# Patient Record
Sex: Male | Born: 1940 | Race: White | Hispanic: No | Marital: Married | Smoking: Current some day smoker
Health system: Southern US, Community
[De-identification: ages and names within clinical notes are randomized; demographics above are authoritative.]

## PROBLEM LIST (undated history)

## (undated) DIAGNOSIS — G4730 Sleep apnea, unspecified: Secondary | ICD-10-CM

## (undated) DIAGNOSIS — C159 Malignant neoplasm of esophagus, unspecified: Secondary | ICD-10-CM

## (undated) DIAGNOSIS — I4891 Unspecified atrial fibrillation: Secondary | ICD-10-CM

## (undated) DIAGNOSIS — I4819 Other persistent atrial fibrillation: Secondary | ICD-10-CM

## (undated) DIAGNOSIS — I499 Cardiac arrhythmia, unspecified: Secondary | ICD-10-CM

## (undated) DIAGNOSIS — K219 Gastro-esophageal reflux disease without esophagitis: Secondary | ICD-10-CM

## (undated) DIAGNOSIS — I10 Essential (primary) hypertension: Secondary | ICD-10-CM

## (undated) DIAGNOSIS — Z9289 Personal history of other medical treatment: Secondary | ICD-10-CM

## (undated) DIAGNOSIS — M1990 Unspecified osteoarthritis, unspecified site: Secondary | ICD-10-CM

## (undated) DIAGNOSIS — J449 Chronic obstructive pulmonary disease, unspecified: Secondary | ICD-10-CM

## (undated) HISTORY — PX: HERNIA REPAIR: SHX51

---

## 2000-04-13 DIAGNOSIS — C439 Malignant melanoma of skin, unspecified: Secondary | ICD-10-CM | POA: Insufficient documentation

## 2012-08-15 DIAGNOSIS — J309 Allergic rhinitis, unspecified: Secondary | ICD-10-CM | POA: Insufficient documentation

## 2012-08-15 DIAGNOSIS — J449 Chronic obstructive pulmonary disease, unspecified: Secondary | ICD-10-CM | POA: Insufficient documentation

## 2012-08-15 DIAGNOSIS — R0602 Shortness of breath: Secondary | ICD-10-CM | POA: Insufficient documentation

## 2012-08-15 DIAGNOSIS — I10 Essential (primary) hypertension: Secondary | ICD-10-CM | POA: Insufficient documentation

## 2012-08-15 DIAGNOSIS — B0229 Other postherpetic nervous system involvement: Secondary | ICD-10-CM | POA: Insufficient documentation

## 2012-08-15 DIAGNOSIS — R5381 Other malaise: Secondary | ICD-10-CM | POA: Insufficient documentation

## 2012-08-15 DIAGNOSIS — G933 Postviral fatigue syndrome: Secondary | ICD-10-CM | POA: Insufficient documentation

## 2012-08-15 DIAGNOSIS — M48061 Spinal stenosis, lumbar region without neurogenic claudication: Secondary | ICD-10-CM | POA: Insufficient documentation

## 2012-08-15 DIAGNOSIS — R0681 Apnea, not elsewhere classified: Secondary | ICD-10-CM | POA: Insufficient documentation

## 2012-08-15 DIAGNOSIS — M169 Osteoarthritis of hip, unspecified: Secondary | ICD-10-CM | POA: Insufficient documentation

## 2012-08-15 DIAGNOSIS — M549 Dorsalgia, unspecified: Secondary | ICD-10-CM | POA: Insufficient documentation

## 2012-08-15 DIAGNOSIS — R6889 Other general symptoms and signs: Secondary | ICD-10-CM | POA: Insufficient documentation

## 2012-08-15 DIAGNOSIS — I482 Chronic atrial fibrillation, unspecified: Secondary | ICD-10-CM | POA: Insufficient documentation

## 2012-08-15 DIAGNOSIS — M1710 Unilateral primary osteoarthritis, unspecified knee: Secondary | ICD-10-CM | POA: Insufficient documentation

## 2012-10-15 DIAGNOSIS — N400 Enlarged prostate without lower urinary tract symptoms: Secondary | ICD-10-CM | POA: Insufficient documentation

## 2012-11-23 DIAGNOSIS — K219 Gastro-esophageal reflux disease without esophagitis: Secondary | ICD-10-CM | POA: Insufficient documentation

## 2012-11-23 DIAGNOSIS — M5430 Sciatica, unspecified side: Secondary | ICD-10-CM | POA: Insufficient documentation

## 2013-03-11 DIAGNOSIS — G5600 Carpal tunnel syndrome, unspecified upper limb: Secondary | ICD-10-CM | POA: Insufficient documentation

## 2013-10-09 DIAGNOSIS — G4730 Sleep apnea, unspecified: Secondary | ICD-10-CM | POA: Insufficient documentation

## 2014-02-05 DIAGNOSIS — R1310 Dysphagia, unspecified: Secondary | ICD-10-CM | POA: Insufficient documentation

## 2014-03-18 ENCOUNTER — Ambulatory Visit: Payer: Self-pay | Admitting: Oncology

## 2014-03-27 LAB — COMPREHENSIVE METABOLIC PANEL
ALBUMIN: 3.8 g/dL (ref 3.4–5.0)
Alkaline Phosphatase: 89 U/L
Anion Gap: 7 (ref 7–16)
BUN: 16 mg/dL (ref 7–18)
Bilirubin,Total: 0.6 mg/dL (ref 0.2–1.0)
CHLORIDE: 102 mmol/L (ref 98–107)
Calcium, Total: 8.7 mg/dL (ref 8.5–10.1)
Co2: 32 mmol/L (ref 21–32)
Creatinine: 1.11 mg/dL (ref 0.60–1.30)
EGFR (African American): >60
GLUCOSE: 107 mg/dL — AB (ref 65–99)
Osmolality: 283 (ref 275–301)
Potassium: 4.1 mmol/L (ref 3.5–5.1)
SGOT(AST): 22 U/L (ref 15–37)
SGPT (ALT): 27 U/L
SODIUM: 141 mmol/L (ref 136–145)
Total Protein: 6.9 g/dL (ref 6.4–8.2)

## 2014-03-27 LAB — CBC CANCER CENTER
BASOS ABS: 0 x10 3/mm (ref 0.0–0.1)
BASOS PCT: 0.7 %
Eosinophil #: 0.2 x10 3/mm (ref 0.0–0.7)
Eosinophil %: 3.2 %
HCT: 44.3 % (ref 40.0–52.0)
HGB: 14.9 g/dL (ref 13.0–18.0)
Lymphocyte #: 2 x10 3/mm (ref 1.0–3.6)
Lymphocyte %: 28.5 %
MCH: 29.3 pg (ref 26.0–34.0)
MCHC: 33.6 g/dL (ref 32.0–36.0)
MCV: 87 fL (ref 80–100)
Monocyte #: 0.6 x10 3/mm (ref 0.2–1.0)
Monocyte %: 8.9 %
Neutrophil #: 4.1 x10 3/mm (ref 1.4–6.5)
Neutrophil %: 58.7 %
PLATELETS: 196 x10 3/mm (ref 150–440)
RBC: 5.07 10*6/uL (ref 4.40–5.90)
RDW: 14.5 % (ref 11.5–14.5)
WBC: 6.9 x10 3/mm (ref 3.8–10.6)

## 2014-04-03 LAB — CBC CANCER CENTER
BASOS PCT: 0.7 %
Basophil #: 0 x10 3/mm (ref 0.0–0.1)
Eosinophil #: 0.2 x10 3/mm (ref 0.0–0.7)
Eosinophil %: 3.4 %
HCT: 43.7 % (ref 40.0–52.0)
HGB: 14.6 g/dL (ref 13.0–18.0)
LYMPHS ABS: 2.3 x10 3/mm (ref 1.0–3.6)
Lymphocyte %: 34.8 %
MCH: 29.5 pg (ref 26.0–34.0)
MCHC: 33.4 g/dL (ref 32.0–36.0)
MCV: 88 fL (ref 80–100)
MONOS PCT: 5.4 %
Monocyte #: 0.4 x10 3/mm (ref 0.2–1.0)
NEUTROS ABS: 3.6 x10 3/mm (ref 1.4–6.5)
Neutrophil %: 55.7 %
PLATELETS: 196 x10 3/mm (ref 150–440)
RBC: 4.96 10*6/uL (ref 4.40–5.90)
RDW: 14.6 % — ABNORMAL HIGH (ref 11.5–14.5)
WBC: 6.5 x10 3/mm (ref 3.8–10.6)

## 2014-04-03 LAB — COMPREHENSIVE METABOLIC PANEL
ALBUMIN: 3.6 g/dL (ref 3.4–5.0)
AST: 22 U/L (ref 15–37)
Alkaline Phosphatase: 95 U/L
Anion Gap: 6 — ABNORMAL LOW (ref 7–16)
BILIRUBIN TOTAL: 0.7 mg/dL (ref 0.2–1.0)
BUN: 20 mg/dL — ABNORMAL HIGH (ref 7–18)
CALCIUM: 8.8 mg/dL (ref 8.5–10.1)
CHLORIDE: 101 mmol/L (ref 98–107)
Co2: 31 mmol/L (ref 21–32)
Creatinine: 1.12 mg/dL (ref 0.60–1.30)
GLUCOSE: 106 mg/dL — AB (ref 65–99)
OSMOLALITY: 279 (ref 275–301)
Potassium: 5 mmol/L (ref 3.5–5.1)
SGPT (ALT): 29 U/L
Sodium: 138 mmol/L (ref 136–145)
TOTAL PROTEIN: 6.8 g/dL (ref 6.4–8.2)

## 2014-04-10 LAB — CBC CANCER CENTER
Basophil #: 0 x10 3/mm (ref 0.0–0.1)
Basophil %: 0.8 %
Eosinophil #: 0.1 x10 3/mm (ref 0.0–0.7)
Eosinophil %: 2.3 %
HCT: 43.9 % (ref 40.0–52.0)
HGB: 14.6 g/dL (ref 13.0–18.0)
LYMPHS PCT: 42.1 %
Lymphocyte #: 1.5 x10 3/mm (ref 1.0–3.6)
MCH: 29.5 pg (ref 26.0–34.0)
MCHC: 33.2 g/dL (ref 32.0–36.0)
MCV: 89 fL (ref 80–100)
Monocyte #: 0.4 x10 3/mm (ref 0.2–1.0)
Monocyte %: 10 %
NEUTROS PCT: 44.8 %
Neutrophil #: 1.6 x10 3/mm (ref 1.4–6.5)
PLATELETS: 188 x10 3/mm (ref 150–440)
RBC: 4.94 10*6/uL (ref 4.40–5.90)
RDW: 14.1 % (ref 11.5–14.5)
WBC: 3.6 x10 3/mm — ABNORMAL LOW (ref 3.8–10.6)

## 2014-04-10 LAB — COMPREHENSIVE METABOLIC PANEL
ALK PHOS: 86 U/L
ALT: 30 U/L
Albumin: 3.6 g/dL (ref 3.4–5.0)
Anion Gap: 7 (ref 7–16)
BILIRUBIN TOTAL: 0.5 mg/dL (ref 0.2–1.0)
BUN: 23 mg/dL — AB (ref 7–18)
CO2: 31 mmol/L (ref 21–32)
CREATININE: 1.15 mg/dL (ref 0.60–1.30)
Calcium, Total: 9 mg/dL (ref 8.5–10.1)
Chloride: 103 mmol/L (ref 98–107)
EGFR (African American): >60
GLUCOSE: 91 mg/dL (ref 65–99)
Osmolality: 285 (ref 275–301)
Potassium: 4.9 mmol/L (ref 3.5–5.1)
SGOT(AST): 19 U/L (ref 15–37)
Sodium: 141 mmol/L (ref 136–145)
Total Protein: 6.9 g/dL (ref 6.4–8.2)

## 2014-04-17 LAB — COMPREHENSIVE METABOLIC PANEL
ANION GAP: 9 (ref 7–16)
AST: 23 U/L (ref 15–37)
Albumin: 3.6 g/dL (ref 3.4–5.0)
Alkaline Phosphatase: 91 U/L
BILIRUBIN TOTAL: 0.3 mg/dL (ref 0.2–1.0)
BUN: 13 mg/dL (ref 7–18)
CHLORIDE: 104 mmol/L (ref 98–107)
Calcium, Total: 8.6 mg/dL (ref 8.5–10.1)
Co2: 30 mmol/L (ref 21–32)
Creatinine: 1.05 mg/dL (ref 0.60–1.30)
EGFR (Non-African Amer.): >60
Glucose: 100 mg/dL — ABNORMAL HIGH (ref 65–99)
Osmolality: 285 (ref 275–301)
Potassium: 4.7 mmol/L (ref 3.5–5.1)
SGPT (ALT): 32 U/L
Sodium: 143 mmol/L (ref 136–145)
Total Protein: 6.7 g/dL (ref 6.4–8.2)

## 2014-04-17 LAB — CBC CANCER CENTER
Basophil #: 0.1 x10 3/mm (ref 0.0–0.1)
Basophil %: 2.1 %
Eosinophil #: 0 x10 3/mm (ref 0.0–0.7)
Eosinophil %: 0.9 %
HCT: 42.4 % (ref 40.0–52.0)
HGB: 14.1 g/dL (ref 13.0–18.0)
Lymphocyte #: 0.7 x10 3/mm — ABNORMAL LOW (ref 1.0–3.6)
Lymphocyte %: 27.5 %
MCH: 29.4 pg (ref 26.0–34.0)
MCHC: 33.2 g/dL (ref 32.0–36.0)
MCV: 88 fL (ref 80–100)
MONOS PCT: 12.7 %
Monocyte #: 0.3 x10 3/mm (ref 0.2–1.0)
NEUTROS ABS: 1.5 x10 3/mm (ref 1.4–6.5)
NEUTROS PCT: 56.8 %
Platelet: 170 x10 3/mm (ref 150–440)
RBC: 4.79 10*6/uL (ref 4.40–5.90)
RDW: 14.1 % (ref 11.5–14.5)
WBC: 2.7 x10 3/mm — ABNORMAL LOW (ref 3.8–10.6)

## 2014-04-18 ENCOUNTER — Ambulatory Visit: Payer: Self-pay | Admitting: Oncology

## 2014-04-28 LAB — CBC CANCER CENTER
BASOS ABS: 0 x10 3/mm (ref 0.0–0.1)
Basophil %: 1 %
EOS ABS: 0 x10 3/mm (ref 0.0–0.7)
Eosinophil %: 1.3 %
HCT: 38.8 % — ABNORMAL LOW (ref 40.0–52.0)
HGB: 13.3 g/dL (ref 13.0–18.0)
LYMPHS ABS: 0.8 x10 3/mm — AB (ref 1.0–3.6)
Lymphocyte %: 25 %
MCH: 29.9 pg (ref 26.0–34.0)
MCHC: 34.2 g/dL (ref 32.0–36.0)
MCV: 88 fL (ref 80–100)
MONOS PCT: 18.5 %
Monocyte #: 0.6 x10 3/mm (ref 0.2–1.0)
Neutrophil #: 1.8 x10 3/mm (ref 1.4–6.5)
Neutrophil %: 54.2 %
Platelet: 89 x10 3/mm — ABNORMAL LOW (ref 150–440)
RBC: 4.43 10*6/uL (ref 4.40–5.90)
RDW: 14.4 % (ref 11.5–14.5)
WBC: 3.4 x10 3/mm — ABNORMAL LOW (ref 3.8–10.6)

## 2014-05-01 LAB — CBC CANCER CENTER
BASOS ABS: 0 x10 3/mm (ref 0.0–0.1)
Basophil %: 0.9 %
EOS ABS: 0 x10 3/mm (ref 0.0–0.7)
Eosinophil %: 1.4 %
HCT: 39.8 % — AB (ref 40.0–52.0)
HGB: 13.3 g/dL (ref 13.0–18.0)
LYMPHS ABS: 0.8 x10 3/mm — AB (ref 1.0–3.6)
Lymphocyte %: 24.4 %
MCH: 29.9 pg (ref 26.0–34.0)
MCHC: 33.6 g/dL (ref 32.0–36.0)
MCV: 89 fL (ref 80–100)
Monocyte #: 0.8 x10 3/mm (ref 0.2–1.0)
Monocyte %: 24 %
Neutrophil #: 1.6 x10 3/mm (ref 1.4–6.5)
Neutrophil %: 49.3 %
PLATELETS: 80 x10 3/mm — AB (ref 150–440)
RBC: 4.47 10*6/uL (ref 4.40–5.90)
RDW: 15.1 % — ABNORMAL HIGH (ref 11.5–14.5)
WBC: 3.2 x10 3/mm — AB (ref 3.8–10.6)

## 2014-05-01 LAB — COMPREHENSIVE METABOLIC PANEL
ALK PHOS: 90 U/L
ALT: 31 U/L
Albumin: 3.6 g/dL (ref 3.4–5.0)
Anion Gap: 7 (ref 7–16)
BILIRUBIN TOTAL: 0.5 mg/dL (ref 0.2–1.0)
BUN: 15 mg/dL (ref 7–18)
CHLORIDE: 103 mmol/L (ref 98–107)
CREATININE: 1.07 mg/dL (ref 0.60–1.30)
Calcium, Total: 8.6 mg/dL (ref 8.5–10.1)
Co2: 33 mmol/L — ABNORMAL HIGH (ref 21–32)
EGFR (African American): >60
EGFR (Non-African Amer.): >60
Glucose: 82 mg/dL (ref 65–99)
OSMOLALITY: 285 (ref 275–301)
Potassium: 4.2 mmol/L (ref 3.5–5.1)
SGOT(AST): 27 U/L (ref 15–37)
SODIUM: 143 mmol/L (ref 136–145)
Total Protein: 6.9 g/dL (ref 6.4–8.2)

## 2014-05-14 LAB — COMPREHENSIVE METABOLIC PANEL
ALBUMIN: 3.1 g/dL — AB (ref 3.4–5.0)
Alkaline Phosphatase: 81 U/L (ref 46–116)
Anion Gap: 7 (ref 7–16)
BILIRUBIN TOTAL: 0.5 mg/dL (ref 0.2–1.0)
BUN: 14 mg/dL (ref 7–18)
CREATININE: 1.11 mg/dL (ref 0.60–1.30)
Calcium, Total: 8.2 mg/dL — ABNORMAL LOW (ref 8.5–10.1)
Chloride: 103 mmol/L (ref 98–107)
Co2: 30 mmol/L (ref 21–32)
EGFR (African American): >60
EGFR (Non-African Amer.): >60
GLUCOSE: 145 mg/dL — AB (ref 65–99)
OSMOLALITY: 282 (ref 275–301)
Potassium: 3.8 mmol/L (ref 3.5–5.1)
SGOT(AST): 21 U/L (ref 15–37)
SGPT (ALT): 24 U/L (ref 14–63)
SODIUM: 140 mmol/L (ref 136–145)
Total Protein: 6.1 g/dL — ABNORMAL LOW (ref 6.4–8.2)

## 2014-05-14 LAB — CBC CANCER CENTER
BASOS PCT: 1 %
Basophil #: 0 x10 3/mm (ref 0.0–0.1)
EOS PCT: 5.1 %
Eosinophil #: 0.2 x10 3/mm (ref 0.0–0.7)
HCT: 40.4 % (ref 40.0–52.0)
HGB: 13.5 g/dL (ref 13.0–18.0)
LYMPHS ABS: 0.6 x10 3/mm — AB (ref 1.0–3.6)
Lymphocyte %: 13.7 %
MCH: 30.4 pg (ref 26.0–34.0)
MCHC: 33.4 g/dL (ref 32.0–36.0)
MCV: 91 fL (ref 80–100)
Monocyte #: 0.7 x10 3/mm (ref 0.2–1.0)
Monocyte %: 16.3 %
NEUTROS ABS: 2.7 x10 3/mm (ref 1.4–6.5)
Neutrophil %: 63.9 %
Platelet: 150 x10 3/mm (ref 150–440)
RBC: 4.45 10*6/uL (ref 4.40–5.90)
RDW: 19.1 % — AB (ref 11.5–14.5)
WBC: 4.2 x10 3/mm (ref 3.8–10.6)

## 2014-05-19 ENCOUNTER — Ambulatory Visit: Payer: Self-pay | Admitting: Oncology

## 2014-05-22 LAB — COMPREHENSIVE METABOLIC PANEL
ALBUMIN: 3.4 g/dL (ref 3.4–5.0)
Alkaline Phosphatase: 93 U/L (ref 46–116)
Anion Gap: 6 — ABNORMAL LOW (ref 7–16)
BUN: 19 mg/dL — AB (ref 7–18)
Bilirubin,Total: 0.4 mg/dL (ref 0.2–1.0)
CHLORIDE: 105 mmol/L (ref 98–107)
CREATININE: 1.19 mg/dL (ref 0.60–1.30)
Calcium, Total: 8.6 mg/dL (ref 8.5–10.1)
Co2: 31 mmol/L (ref 21–32)
EGFR (African American): >60
EGFR (Non-African Amer.): >60
Glucose: 104 mg/dL — ABNORMAL HIGH (ref 65–99)
Osmolality: 286 (ref 275–301)
Potassium: 4.6 mmol/L (ref 3.5–5.1)
SGOT(AST): 24 U/L (ref 15–37)
SGPT (ALT): 26 U/L (ref 14–63)
Sodium: 142 mmol/L (ref 136–145)
TOTAL PROTEIN: 6.8 g/dL (ref 6.4–8.2)

## 2014-05-22 LAB — CBC CANCER CENTER
Basophil #: 0 x10 3/mm (ref 0.0–0.1)
Basophil %: 1.1 %
Eosinophil #: 0.1 x10 3/mm (ref 0.0–0.7)
Eosinophil %: 3.5 %
HCT: 38.5 % — ABNORMAL LOW (ref 40.0–52.0)
HGB: 13.1 g/dL (ref 13.0–18.0)
Lymphocyte #: 0.5 x10 3/mm — ABNORMAL LOW (ref 1.0–3.6)
Lymphocyte %: 17.3 %
MCH: 31 pg (ref 26.0–34.0)
MCHC: 34.1 g/dL (ref 32.0–36.0)
MCV: 91 fL (ref 80–100)
Monocyte #: 0.5 x10 3/mm (ref 0.2–1.0)
Monocyte %: 16.3 %
NEUTROS PCT: 61.8 %
Neutrophil #: 1.8 x10 3/mm (ref 1.4–6.5)
Platelet: 157 x10 3/mm (ref 150–440)
RBC: 4.23 10*6/uL — ABNORMAL LOW (ref 4.40–5.90)
RDW: 19.5 % — ABNORMAL HIGH (ref 11.5–14.5)
WBC: 2.9 x10 3/mm — ABNORMAL LOW (ref 3.8–10.6)

## 2014-06-17 ENCOUNTER — Ambulatory Visit
Admit: 2014-06-17 | Disposition: A | Discharge status: Home / Self Care | Payer: Self-pay | Attending: Oncology | Admitting: Oncology

## 2014-06-17 ENCOUNTER — Ambulatory Visit: Admit: 2014-06-17 | Disposition: A | Discharge status: Home / Self Care | Payer: Self-pay | Admitting: Oncology

## 2014-07-16 LAB — COMPREHENSIVE METABOLIC PANEL
ALK PHOS: 79 U/L
ALT: 17 U/L
Albumin: 3.8 g/dL
Anion Gap: 3 — ABNORMAL LOW (ref 7–16)
BILIRUBIN TOTAL: 0.7 mg/dL
BUN: 20 mg/dL
CREATININE: 1.1 mg/dL
Calcium, Total: 9 mg/dL
Chloride: 102 mmol/L
Co2: 31 mmol/L
EGFR (African American): >60
Glucose: 98 mg/dL
Potassium: 4.2 mmol/L
SGOT(AST): 27 U/L
SODIUM: 136 mmol/L
Total Protein: 6.5 g/dL

## 2014-07-16 LAB — CBC CANCER CENTER
BASOS PCT: 1.2 %
Basophil #: 0 x10 3/mm (ref 0.0–0.1)
Eosinophil #: 0.1 x10 3/mm (ref 0.0–0.7)
Eosinophil %: 3.9 %
HCT: 37.3 % — ABNORMAL LOW (ref 40.0–52.0)
HGB: 12.7 g/dL — AB (ref 13.0–18.0)
LYMPHS PCT: 27.5 %
Lymphocyte #: 1 x10 3/mm (ref 1.0–3.6)
MCH: 33 pg (ref 26.0–34.0)
MCHC: 34.1 g/dL (ref 32.0–36.0)
MCV: 97 fL (ref 80–100)
MONOS PCT: 15.8 %
Monocyte #: 0.6 x10 3/mm (ref 0.2–1.0)
NEUTROS ABS: 1.9 x10 3/mm (ref 1.4–6.5)
Neutrophil %: 51.6 %
Platelet: 135 x10 3/mm — ABNORMAL LOW (ref 150–440)
RBC: 3.86 10*6/uL — ABNORMAL LOW (ref 4.40–5.90)
RDW: 15.2 % — ABNORMAL HIGH (ref 11.5–14.5)
WBC: 3.7 x10 3/mm — AB (ref 3.8–10.6)

## 2014-07-23 ENCOUNTER — Ambulatory Visit
Admit: 2014-07-23 | Disposition: A | Discharge status: Home / Self Care | Payer: Self-pay | Attending: Oncology | Admitting: Oncology

## 2014-08-09 NOTE — Consult Note (Signed)
Reason for Visit: This 74 year old Male patient presents to the clinic for initial evaluation of  esophageal cancer .   Referred by [facility].  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 Patient also has comorbidities including sleep apnea hypertensionand difficulty ambulating. He also has a history of atrial fibrillation. He went on to have a barium swallow showing a 6 x 4.3 cm mass in thedistal esophagus. Biopsy was positive for signet ring adenocarcinoma. Tumor was partially obstructing had invasion of the muscularis propria layer four. There are also 2 abnormal lymph nodes noted on ultrasound examination. These were located in the paraesophageal mediastinum. PET scan showed no evidence of distant disease. Based on his multiple medical comorbidities we've been asked to evaluate the patient for combined modality treatment. Depending on results surgery may be an option. He is seen today and is doing fairly well. He is obese. He states his swallowing is fine although certain foods do stick.  Past, Family and Social History:  Past Medical History positive   Cardiovascular atrial fibrillation; hypertension   Respiratory sleep apnea   Past Medical History Comments skin cancer   Family History positive   Family History Comments family history of breast cancer colorectal cancer lung cancer prostate cancer gastric cancer   Social History noncontributory    Additional Past Medical and Surgical History accompanied by daughter today   Allergies:   No Known Allergies:   Home Meds:  Home Medications: Medication Instructions Status  gabapentin 300 mg oral capsule 1 cap(s) orally 2 times a day Active  furosemide 20 mg oral tablet 1 tab(s) orally 2 times a day Active  tamsulosin 0.4 mg oral capsule 1 cap(s) orally once a day Active  enalapril 20 mg oral tablet 1 tab(s) orally 2 times a day Active  diltiazem 120 mg oral tablet 1 tab(s) orally once a day Active  warfarin 4 mg oral tablet 2.5 tab(s) orally once a day Active  metoprolol 50 mg oral tablet 1 tab(s) orally 2 times a day Active  enalapril 20 mg oral tablet 1 tab(s) orally 2 times a day Active  Norco 325 mg-5 mg oral tablet 1 tab(s) orally every 8 hours, As Needed - for Pain Active   Review of Systems:  General negative   Performance Status (ECOG) 0   Skin negative   Breast negative   Ophthalmologic negative   ENMT negative   Respiratory and Thorax negative   Cardiovascular negative   Gastrointestinal see HPI   Genitourinary negative   Musculoskeletal negative   Neurological negative   Psychiatric negative   Hematology/Lymphatics negative   Endocrine negative   Allergic/Immunologic negative   Review of Systems   denies any weight loss, fatigue, weakness, fever, chills or night sweats. Patient denies any loss of vision, blurred vision. Patient denies any ringing  of the ears or hearing loss. No irregular heartbeat. Patient denies heart murmur or history of fainting. Patient denies any chest pain or pain radiating to her upper  extremities. Patient denies any shortness of breath, difficulty breathing at night, cough or hemoptysis. Patient denies any swelling in the lower legs. Patient denies any nausea vomiting, vomiting of blood, or coffee ground material in the vomitus. Patient denies any stomach pain. Patient states has had normal bowel movements no significant  constipation or diarrhea. Patient denies any dysuria, hematuria or significant nocturia. Patient denies any problems walking, swelling in the joints or loss of balance. Patient denies any skin changes, loss of hair or loss of weight. Patient denies any excessive worrying or anxiety or significant depression. Patient denies any problems with insomnia. Patient denies excessive thirst, polyuria, polydipsia. Patient denies any swollen glands, patient denies easy bruising or easy bleeding. Patient denies any recent infections, allergies or URI. Patient "s visual fields have not changed significantly in recent time.   Nursing Notes:  Nursing Vital Signs and Chemo Nursing Nursing Notes: *CC Vital Signs Flowsheet:   01-Dec-15 14:36  Temp Temperature 96.6  Pulse Pulse 63  Respirations Respirations 20  SBP SBP 128  DBP DBP 62  Pain Scale (0-10)  0  Current Weight (kg) (kg) 111.8   Physical Exam:  General/Skin/HEENT:  General normal   Skin normal   Eyes normal   ENMT normal   Head and Neck normal   Additional PE well-developed slightly obese male in NAD walks with the assistance of a cane. No cervical or subclavicular adenopathy is appreciated lungs are clear to A&P cardiac examination shows an irregular irregular heartbeat. Abdomen is benign with no organomegaly or masses noted.   Breasts/Resp/CV/GI/GU:  Respiratory and Thorax normal   Cardiovascular normal   Gastrointestinal normal   Genitourinary normal   MS/Neuro/Psych/Lymph:  Musculoskeletal normal   Neurological normal   Lymphatics normal   Relevent Results:   Relevant Scans and Labs CT scans and PET CT scan from [facility] are requested for our review   Assessment and Plan: Impression:   stage IIB distal esophageal adenocarcinoma in 74 year old male Plan:   this time I have recommended going ahead with combined modality treatment. Since we will probably only be using chemoradiation with a slightly higher dose than  standard 5000 cGy in 28 fractions. We'll treat up to 5400 cGy using 3-dimensional treatment planning including the areas of lymph node involvement by PET CT criteria. I discussed the case personally with medical oncology who will be coordinating his chemotherapy. Risks and benefits of treatment including increased dysphagia, skin reaction, fatigue, alteration of blood counts. I have ordered and set up CT sedation for early next week after review of all of his films. Patient and daughter seem to copperhead my treatment plan well.  I would like to take this opportunity for allowing me to participate in the care of your patient..  Electronic Signatures: Armstead Peaks (MD)  (Signed 01-Dec-15 15:48)  Authored: HPI, Diagnosis, PFSH, Allergies, Home Meds, ROS, Nursing Notes, Physical Exam, Relevent Results, Encounter Assessment and Plan   Last Updated: 01-Dec-15 15:48 by Armstead Peaks (MD)

## 2014-10-08 DIAGNOSIS — R0789 Other chest pain: Secondary | ICD-10-CM | POA: Insufficient documentation

## 2014-10-08 DIAGNOSIS — R079 Chest pain, unspecified: Secondary | ICD-10-CM | POA: Insufficient documentation

## 2014-10-15 ENCOUNTER — Inpatient Hospital Stay: Payer: Self-pay | Attending: Oncology | Admitting: Oncology

## 2014-10-30 ENCOUNTER — Inpatient Hospital Stay: Payer: Medicare Other | Attending: Oncology | Admitting: Oncology

## 2014-10-30 VITALS — BP 125/83 | HR 118 | Temp 96.1°F | Resp 18 | Ht 67.0 in | Wt 241.8 lb

## 2014-10-30 DIAGNOSIS — D6959 Other secondary thrombocytopenia: Secondary | ICD-10-CM | POA: Diagnosis not present

## 2014-10-30 DIAGNOSIS — Z7901 Long term (current) use of anticoagulants: Secondary | ICD-10-CM | POA: Diagnosis not present

## 2014-10-30 DIAGNOSIS — J9811 Atelectasis: Secondary | ICD-10-CM | POA: Insufficient documentation

## 2014-10-30 DIAGNOSIS — Z9221 Personal history of antineoplastic chemotherapy: Secondary | ICD-10-CM | POA: Insufficient documentation

## 2014-10-30 DIAGNOSIS — Z79899 Other long term (current) drug therapy: Secondary | ICD-10-CM | POA: Diagnosis not present

## 2014-10-30 DIAGNOSIS — I4891 Unspecified atrial fibrillation: Secondary | ICD-10-CM | POA: Insufficient documentation

## 2014-10-30 DIAGNOSIS — D3500 Benign neoplasm of unspecified adrenal gland: Secondary | ICD-10-CM | POA: Diagnosis not present

## 2014-10-30 DIAGNOSIS — Z923 Personal history of irradiation: Secondary | ICD-10-CM | POA: Diagnosis not present

## 2014-10-30 DIAGNOSIS — I517 Cardiomegaly: Secondary | ICD-10-CM | POA: Insufficient documentation

## 2014-10-30 DIAGNOSIS — I10 Essential (primary) hypertension: Secondary | ICD-10-CM

## 2014-10-30 DIAGNOSIS — G4730 Sleep apnea, unspecified: Secondary | ICD-10-CM | POA: Insufficient documentation

## 2014-10-30 DIAGNOSIS — C159 Malignant neoplasm of esophagus, unspecified: Secondary | ICD-10-CM | POA: Diagnosis present

## 2014-10-30 NOTE — Progress Notes (Signed)
Patient states that overall he has been feeling preety good. He states that sometimes when he eats he feels like his food gets stuck. He states that [facility] gave him a medication to help coat his throat and stomach. He states that he just started taking it.

## 2014-11-06 NOTE — Progress Notes (Signed)
[place]  [phone] [phone]  ID: Jeremy Gordon OB: 08-05-1940  MR#: [id number]  BZJ#:[id number]  Patient Care Team: No Pcp Per Patient as PCP - General (General Practice)  CHIEF COMPLAINT:  Chief Complaint  Patient presents with  . Follow-up    Esophageal cancer    INTERVAL HISTORY: Patient returns to clinic today for further evaluation and laboratory work. He currently feels well and is asymptomatic. He does not complain of any difficulty swallowing or dysphasia. He has no neurologic complaints. He denies any recent fevers. He has a fair appetite. He denies any chest pain, shortness of breath, or cough. He denies any nausea, vomiting, constipation, or diarrhea. He has no melena or hematochezia. He has no urinary complaints. Patient offers no specific complaints today.  REVIEW OF SYSTEMS:   Review of Systems  Constitutional: Negative.   Respiratory: Negative.   Cardiovascular: Negative.   Gastrointestinal: Negative.     As per HPI. Otherwise, a complete review of systems is negatve.  PAST MEDICAL HISTORY:  Hypertension, sleep apnea, atrial fibrillation.  PAST SURGICAL HISTORY: Negative.  FAMILY HISTORY: Reviewed and unchanged. No report of malignancy or chronic disease.     ADVANCED DIRECTIVES:    HEALTH MAINTENANCE: History  Substance Use Topics  . Smoking status: Not on file  . Smokeless tobacco: Not on file  . Alcohol Use: Not on file     Colonoscopy:  PAP:  Bone density:  Lipid panel:  No Known Allergies  Current Outpatient Prescriptions  Medication Sig Dispense Refill  . albuterol (PROAIR HFA) 108 (90 BASE) MCG/ACT inhaler Inhale into the lungs.    Marland Kitchen diltiazem (DILACOR XR) 120 MG 24 hr capsule Take 120 mg by mouth.    . enalapril (VASOTEC) 20 MG tablet Take 20 mg by mouth.    . finasteride (PROSCAR) 5 MG tablet Take 5 mg by mouth.    . fluticasone (FLONASE) 50 MCG/ACT nasal spray 1 spray by Each Nare  route nightly. Frequency:QD   Dosage:50   MCG  Instructions:  Note:Dose: 22 MCG    . furosemide (LASIX) 20 MG tablet Take 20 mg by mouth.    . gabapentin (NEURONTIN) 300 MG capsule Take 300 mg by mouth.    Marland Kitchen HYDROcodone-acetaminophen (LORTAB) 10-500 MG per tablet Take by mouth.    . metoprolol (LOPRESSOR) 50 MG tablet Take 50 mg by mouth.    Marland Kitchen omeprazole (PRILOSEC) 20 MG capsule TAKE ONE (1) CAPSULE BY MOUTH 2 TIMES DAILY    . sucralfate (CARAFATE) 1 GM/10ML suspension Take by mouth.    . tamsulosin (FLOMAX) 0.4 MG CAPS capsule Take 0.4 mg by mouth.    . warfarin (COUMADIN) 4 MG tablet Take 12 mg by mouth.     No current facility-administered medications for this visit.    OBJECTIVE: Filed Vitals:   10/30/14 1422  BP: 125/83  Pulse: 118  Temp: 96.1 F (35.6 C)  Resp: 18     Body mass index is 37.87 kg/(m^2).    ECOG FS:0 - Asymptomatic  General: Well-developed, well-nourished, no acute distress. Eyes: anicteric sclera. HEENT: Normocephalic, moist mucous membranes, clear oropharnyx. Lungs: Clear to auscultation bilaterally. Heart: Regular rate and rhythm. No rubs, murmurs, or gallops. Abdomen: Soft, nontender, nondistended. No organomegaly noted, normoactive bowel sounds. Musculoskeletal: No edema, cyanosis, or clubbing. Neuro: Alert, answering all questions appropriately. Cranial nerves grossly intact. Skin: No rashes or petechiae noted. Psych: Normal affect.   LAB RESULTS:  Lab Results  Component Value  Date   NA 136 07/16/2014   K 4.2 07/16/2014   CL 102 07/16/2014   CO2 31 07/16/2014   GLUCOSE 98 07/16/2014   BUN 20 07/16/2014   CREATININE 1.10 07/16/2014   CALCIUM 9.0 07/16/2014   PROT 6.5 07/16/2014   ALBUMIN 3.8 07/16/2014   AST 27 07/16/2014   ALT 17 07/16/2014   ALKPHOS 79 07/16/2014   BILITOT 0.7 07/16/2014   GFRNONAA >60 07/16/2014   GFRAA >60 07/16/2014    Lab Results  Component Value Date   WBC 3.7* 07/16/2014   NEUTROABS 1.9 07/16/2014   HGB  12.7* 07/16/2014   HCT 37.3* 07/16/2014   MCV 97 07/16/2014   PLT 135* 07/16/2014     STUDIES: No results found.  ASSESSMENT: Clinical stage IIb adenocarcinoma of the esophagus, HER-2 negative.  PLAN:    1. Esophageal cancer: Imaging, including EUS, and pathology results from [facility] reviewed. Patient completed all of his chemotherapy and XRT in February 2016. After lengthy discussion and given patient's advanced age he is likely not a surgical candidate for partial esophagectomy. EGD completed on October 22, 2014 revealed persistent disease. Will restage with CT scan in the next 1-2 weeks with follow-up soon thereafter for additional treatment planning.  2. Atrial fibrillation: Continue Coumadin as prescribed. INRs are monitored by his primary care physician. 3. Thrombocytopenia: Secondary to chemotherapy. Monitor.  Patient expressed understanding and was in agreement with this plan. He also understands that He can call clinic at any time with any questions, concerns, or complaints.   No matching staging information was found for the patient.  Lloyd Huger, MD   11/06/2014 5:54 PM

## 2014-11-10 ENCOUNTER — Ambulatory Visit: Admission: RE | Admit: 2014-11-10 | Payer: Medicare Other | Source: Ambulatory Visit

## 2014-11-10 ENCOUNTER — Ambulatory Visit
Admission: RE | Admit: 2014-11-10 | Discharge: 2014-11-10 | Disposition: A | Discharge status: Home / Self Care | Payer: Medicare Other | Source: Ambulatory Visit | Attending: Oncology | Admitting: Oncology

## 2014-11-10 ENCOUNTER — Ambulatory Visit: Payer: Medicare Other

## 2014-11-10 DIAGNOSIS — Z8501 Personal history of malignant neoplasm of esophagus: Secondary | ICD-10-CM | POA: Insufficient documentation

## 2014-11-10 DIAGNOSIS — J9811 Atelectasis: Secondary | ICD-10-CM | POA: Insufficient documentation

## 2014-11-10 DIAGNOSIS — Z08 Encounter for follow-up examination after completed treatment for malignant neoplasm: Secondary | ICD-10-CM | POA: Insufficient documentation

## 2014-11-10 MED ORDER — IOHEXOL 300 MG/ML  SOLN
75.0000 mL | Freq: Once | INTRAMUSCULAR | Status: AC | PRN
  Administered 2014-11-10: 75 mL via INTRAVENOUS

## 2014-11-12 ENCOUNTER — Inpatient Hospital Stay (HOSPITAL_BASED_OUTPATIENT_CLINIC_OR_DEPARTMENT_OTHER): Payer: Medicare Other | Admitting: Oncology

## 2014-11-12 VITALS — BP 124/78 | HR 79 | Temp 97.1°F | Resp 16 | Wt 236.8 lb

## 2014-11-12 DIAGNOSIS — Z923 Personal history of irradiation: Secondary | ICD-10-CM

## 2014-11-12 DIAGNOSIS — Z79899 Other long term (current) drug therapy: Secondary | ICD-10-CM | POA: Diagnosis not present

## 2014-11-12 DIAGNOSIS — J9811 Atelectasis: Secondary | ICD-10-CM

## 2014-11-12 DIAGNOSIS — I10 Essential (primary) hypertension: Secondary | ICD-10-CM

## 2014-11-12 DIAGNOSIS — C159 Malignant neoplasm of esophagus, unspecified: Secondary | ICD-10-CM

## 2014-11-12 DIAGNOSIS — Z9221 Personal history of antineoplastic chemotherapy: Secondary | ICD-10-CM

## 2014-11-12 DIAGNOSIS — D6959 Other secondary thrombocytopenia: Secondary | ICD-10-CM

## 2014-11-12 DIAGNOSIS — I4891 Unspecified atrial fibrillation: Secondary | ICD-10-CM

## 2014-11-12 DIAGNOSIS — D3500 Benign neoplasm of unspecified adrenal gland: Secondary | ICD-10-CM

## 2014-11-12 DIAGNOSIS — G4730 Sleep apnea, unspecified: Secondary | ICD-10-CM

## 2014-11-12 DIAGNOSIS — Z7901 Long term (current) use of anticoagulants: Secondary | ICD-10-CM

## 2014-11-12 DIAGNOSIS — I517 Cardiomegaly: Secondary | ICD-10-CM

## 2014-11-12 NOTE — Progress Notes (Signed)
[place]  [phone] [phone]) [phone]  ID: Jeremy Gordon OB: 12-27-1940  MR#: [id number]  BWG#:[id number]  Patient Care Team: No Pcp Per Patient as PCP - General (General Practice)  CHIEF COMPLAINT:  No chief complaint on file.   INTERVAL HISTORY: Patient returns to clinic today for further evaluation and treatment planning. He continues to have difficulty swallowing, but otherwise feels well. He continues to work full-time and remain active. He has no neurologic complaints. He denies any recent fevers. He denies any chest pain, shortness of breath, or cough. He denies any nausea, vomiting, constipation, or diarrhea. He has no melena or hematochezia. He has no urinary complaints. Patient offers no further specific complaints today.  REVIEW OF SYSTEMS:   Review of Systems  Constitutional: Negative.   Respiratory: Negative.   Cardiovascular: Negative.   Gastrointestinal: Negative.     As per HPI. Otherwise, a complete review of systems is negatve.  PAST MEDICAL HISTORY:  Hypertension, sleep apnea, atrial fibrillation.  PAST SURGICAL HISTORY: Negative.  FAMILY HISTORY: Reviewed and unchanged. No report of malignancy or chronic disease.     ADVANCED DIRECTIVES:    HEALTH MAINTENANCE: History  Substance Use Topics  . Smoking status: Not on file  . Smokeless tobacco: Not on file  . Alcohol Use: Not on file     Colonoscopy:  PAP:  Bone density:  Lipid panel:  No Known Allergies  Current Outpatient Prescriptions  Medication Sig Dispense Refill  . albuterol (PROAIR HFA) 108 (90 BASE) MCG/ACT inhaler Inhale into the lungs.    Marland Kitchen diltiazem (DILACOR XR) 120 MG 24 hr capsule Take 120 mg by mouth.    . enalapril (VASOTEC) 20 MG tablet Take 20 mg by mouth.    . finasteride (PROSCAR) 5 MG tablet Take 5 mg by mouth.    . fluticasone (FLONASE) 50 MCG/ACT nasal spray 1 spray by Each Nare route nightly. Frequency:QD   Dosage:50   MCG   Instructions:  Note:Dose: 46 MCG    . furosemide (LASIX) 20 MG tablet Take 20 mg by mouth.    . gabapentin (NEURONTIN) 300 MG capsule Take 300 mg by mouth.    Marland Kitchen HYDROcodone-acetaminophen (LORTAB) 10-500 MG per tablet Take by mouth.    . metoprolol (LOPRESSOR) 50 MG tablet Take 50 mg by mouth.    Marland Kitchen omeprazole (PRILOSEC) 20 MG capsule TAKE ONE (1) CAPSULE BY MOUTH 2 TIMES DAILY    . sucralfate (CARAFATE) 1 GM/10ML suspension Take by mouth.    . tamsulosin (FLOMAX) 0.4 MG CAPS capsule Take 0.4 mg by mouth.    . warfarin (COUMADIN) 4 MG tablet Take 12 mg by mouth.     No current facility-administered medications for this visit.    OBJECTIVE: There were no vitals filed for this visit.   There is no weight on file to calculate BMI.    ECOG FS:0 - Asymptomatic  General: Well-developed, well-nourished, no acute distress. Eyes: anicteric sclera. HEENT: Normocephalic, moist mucous membranes, clear oropharnyx. Lungs: Clear to auscultation bilaterally. Heart: Regular rate and rhythm. No rubs, murmurs, or gallops. Abdomen: Soft, nontender, nondistended. No organomegaly noted, normoactive bowel sounds. Musculoskeletal: No edema, cyanosis, or clubbing. Neuro: Alert, answering all questions appropriately. Cranial nerves grossly intact. Skin: No rashes or petechiae noted. Psych: Normal affect.   LAB RESULTS:  Lab Results  Component Value Date   NA 136 07/16/2014   K 4.2 07/16/2014   CL 102 07/16/2014   CO2 31 07/16/2014   GLUCOSE 98  07/16/2014   BUN 20 07/16/2014   CREATININE 1.10 07/16/2014   CALCIUM 9.0 07/16/2014   PROT 6.5 07/16/2014   ALBUMIN 3.8 07/16/2014   AST 27 07/16/2014   ALT 17 07/16/2014   ALKPHOS 79 07/16/2014   BILITOT 0.7 07/16/2014   GFRNONAA >60 07/16/2014   GFRAA >60 07/16/2014    Lab Results  Component Value Date   WBC 3.7* 07/16/2014   NEUTROABS 1.9 07/16/2014   HGB 12.7* 07/16/2014   HCT 37.3* 07/16/2014   MCV 97 07/16/2014   PLT 135* 07/16/2014      STUDIES: Ct Soft Tissue Neck W Contrast  11/10/2014   CLINICAL DATA:  Esophageal cancer, status post chemotherapy, for restaging  EXAM: CT NECK AND CHEST WITH CONTRAST  TECHNIQUE: Multidetector CT imaging of the neck and chest was performed following the standard protocol during bolus administration of intravenous contrast.  CONTRAST:  80m OMNIPAQUE IOHEXOL 300 MG/ML  SOLN  COMPARISON:  None.  FINDINGS: CT NECK FINDINGS  Pharynx and larynx: Within normal limits.  Salivary glands: Within normal limits.  Thyroid: Within normal limits.  Lymph nodes: No suspicious cervical lymphadenopathy.  Vascular: Atherosclerotic calcifications. Otherwise within normal limits.  Limited intracranial: Within normal limits.  Visualized orbits: Visualize globes and retroconal soft tissues are within normal limits.  Mastoids and visualized paranasal sinuses: Visualized paranasal sinuses are essentially clear. Opacification of the bilateral mastoid air cells, left greater than right, likely chronic.  Skeleton: Degenerative changes of the mid cervical spine.  CT CHEST FINDINGS  Mediastinum/Nodes: Mild cardiomegaly.  No pericardial effusion.  Coronary atherosclerosis.  Atherosclerotic calcifications of the aortic root and aortic arch.  Concentric wall thickening involving the distal esophagus esophagus, measuring up to 2.1 cm in thickness (series 2/ image 45), corresponding to known esophageal cancer just above the GE junction.  Small mediastinal lymph nodes, including 7 mm short axis low right paratracheal nodes (series 2/ image 20) and 7 mm short axis subcarinal nodes (series 2/ image 26), indeterminate.  Lungs/Pleura: No suspicious pulmonary nodules.  Mild centrilobular and paraseptal emphysematous changes.  Mild dependent atelectasis in the bilateral lower lobes. No focal consolidation.  No pleural effusion or pneumothorax.  Upper Abdomen: Visualized upper abdomen is notable for vascular calcifications and mild low-density  nodular thickening of the left adrenal gland (series 7/image 84), likely reflecting a 12 mm benign adrenal adenoma.  Musculoskeletal: Degenerative changes the visualized thoracolumbar spine.  IMPRESSION: Concentric wall thickening of the lower esophagus just above the GE junction, corresponding to known esophageal cancer.  Small lower mediastinal lymph nodes measuring up to 7 mm short axis, indeterminate.  Suspected 12 mm left adrenal adenoma, benign.  No findings specific for metastatic disease in the neck/chest.   Electronically Signed   By: SJulian HyM.D.   On: 11/10/2014 12:18   Ct Chest W Contrast  11/10/2014   CLINICAL DATA:  Esophageal cancer, status post chemotherapy, for restaging  EXAM: CT NECK AND CHEST WITH CONTRAST  TECHNIQUE: Multidetector CT imaging of the neck and chest was performed following the standard protocol during bolus administration of intravenous contrast.  CONTRAST:  756mOMNIPAQUE IOHEXOL 300 MG/ML  SOLN  COMPARISON:  None.  FINDINGS: CT NECK FINDINGS  Pharynx and larynx: Within normal limits.  Salivary glands: Within normal limits.  Thyroid: Within normal limits.  Lymph nodes: No suspicious cervical lymphadenopathy.  Vascular: Atherosclerotic calcifications. Otherwise within normal limits.  Limited intracranial: Within normal limits.  Visualized orbits: Visualize globes and retroconal soft tissues are within normal  limits.  Mastoids and visualized paranasal sinuses: Visualized paranasal sinuses are essentially clear. Opacification of the bilateral mastoid air cells, left greater than right, likely chronic.  Skeleton: Degenerative changes of the mid cervical spine.  CT CHEST FINDINGS  Mediastinum/Nodes: Mild cardiomegaly.  No pericardial effusion.  Coronary atherosclerosis.  Atherosclerotic calcifications of the aortic root and aortic arch.  Concentric wall thickening involving the distal esophagus esophagus, measuring up to 2.1 cm in thickness (series 2/ image 45),  corresponding to known esophageal cancer just above the GE junction.  Small mediastinal lymph nodes, including 7 mm short axis low right paratracheal nodes (series 2/ image 20) and 7 mm short axis subcarinal nodes (series 2/ image 26), indeterminate.  Lungs/Pleura: No suspicious pulmonary nodules.  Mild centrilobular and paraseptal emphysematous changes.  Mild dependent atelectasis in the bilateral lower lobes. No focal consolidation.  No pleural effusion or pneumothorax.  Upper Abdomen: Visualized upper abdomen is notable for vascular calcifications and mild low-density nodular thickening of the left adrenal gland (series 7/image 84), likely reflecting a 12 mm benign adrenal adenoma.  Musculoskeletal: Degenerative changes the visualized thoracolumbar spine.  IMPRESSION: Concentric wall thickening of the lower esophagus just above the GE junction, corresponding to known esophageal cancer.  Small lower mediastinal lymph nodes measuring up to 7 mm short axis, indeterminate.  Suspected 12 mm left adrenal adenoma, benign.  No findings specific for metastatic disease in the neck/chest.   Electronically Signed   By: Julian Hy M.D.   On: 11/10/2014 12:18    ASSESSMENT: Recurrent adenocarcinoma of the esophagus, HER-2 negative.  PLAN:    1. Esophageal cancer: CT is also has above reviewed independently. Patient also had recent evaluation at [facility] documenting his persistent or recurrent disease. After lengthy discussion, patient wishes to pursue palliative chemotherapy. Plan on using FOLFIRI every 2 weeks with reimaging after approximately 6 cycles. Prior to initiating treatment, patient will require port placement. PEG tube is also discussed to help improve his nutrition, but patient is refusing this procedure at this time.  Return to clinic in 1-2 weeks after his port is placed to initiate cycle 1 of treatment.  2. Atrial fibrillation: Continue Coumadin as prescribed. INRs are monitored by his primary care  physician. 3. Thrombocytopenia: Secondary to chemotherapy. Monitor.  Approximately 30 minutes was spent in discussion and consultation.  Patient expressed understanding and was in agreement with this plan. He also understands that He can call clinic at any time with any questions, concerns, or complaints.    Lloyd Huger, MD   11/12/2014 4:50 PM

## 2014-11-19 ENCOUNTER — Other Ambulatory Visit: Payer: Self-pay | Admitting: Oncology

## 2014-11-19 DIAGNOSIS — C159 Malignant neoplasm of esophagus, unspecified: Secondary | ICD-10-CM

## 2014-11-20 ENCOUNTER — Ambulatory Visit
Admission: RE | Admit: 2014-11-20 | Discharge: 2014-11-20 | Disposition: A | Discharge status: Home / Self Care | Payer: Medicare Other | Source: Ambulatory Visit | Attending: Vascular Surgery | Admitting: Vascular Surgery

## 2014-11-20 ENCOUNTER — Encounter
Admission: RE | Disposition: A | Discharge status: Home / Self Care | Payer: Self-pay | Source: Ambulatory Visit | Attending: Vascular Surgery

## 2014-11-20 ENCOUNTER — Encounter: Payer: Self-pay | Admitting: *Deleted

## 2014-11-20 DIAGNOSIS — D696 Thrombocytopenia, unspecified: Secondary | ICD-10-CM | POA: Insufficient documentation

## 2014-11-20 DIAGNOSIS — Z7901 Long term (current) use of anticoagulants: Secondary | ICD-10-CM | POA: Diagnosis not present

## 2014-11-20 DIAGNOSIS — C159 Malignant neoplasm of esophagus, unspecified: Secondary | ICD-10-CM | POA: Insufficient documentation

## 2014-11-20 DIAGNOSIS — I4891 Unspecified atrial fibrillation: Secondary | ICD-10-CM | POA: Insufficient documentation

## 2014-11-20 DIAGNOSIS — Z79899 Other long term (current) drug therapy: Secondary | ICD-10-CM | POA: Diagnosis not present

## 2014-11-20 HISTORY — PX: PERIPHERAL VASCULAR CATHETERIZATION: SHX172C

## 2014-11-20 HISTORY — DX: Unspecified atrial fibrillation: I48.91

## 2014-11-20 HISTORY — DX: Malignant neoplasm of esophagus, unspecified: C15.9

## 2014-11-20 HISTORY — DX: Sleep apnea, unspecified: G47.30

## 2014-11-20 LAB — PROTIME-INR
INR: 1.66
Prothrombin Time: 19.8 seconds — ABNORMAL HIGH (ref 11.4–15.0)

## 2014-11-20 SURGERY — PORTA CATH INSERTION
Anesthesia: Moderate Sedation

## 2014-11-20 MED ORDER — FENTANYL CITRATE (PF) 100 MCG/2ML IJ SOLN
INTRAMUSCULAR | Status: AC
  Filled 2014-11-20: qty 2

## 2014-11-20 MED ORDER — SODIUM CHLORIDE 0.9 % IV SOLN
INTRAVENOUS | Status: DC
  Administered 2014-11-20: 11:00:00 via INTRAVENOUS

## 2014-11-20 MED ORDER — HEPARIN (PORCINE) IN NACL 2-0.9 UNIT/ML-% IJ SOLN
INTRAMUSCULAR | Status: AC
  Filled 2014-11-20: qty 500

## 2014-11-20 MED ORDER — LIDOCAINE-EPINEPHRINE (PF) 1 %-1:200000 IJ SOLN
INTRAMUSCULAR | Status: AC
  Filled 2014-11-20: qty 30

## 2014-11-20 MED ORDER — MIDAZOLAM HCL 2 MG/2ML IJ SOLN
INTRAMUSCULAR | Status: AC
  Filled 2014-11-20: qty 2

## 2014-11-20 MED ORDER — FENTANYL CITRATE (PF) 100 MCG/2ML IJ SOLN
INTRAMUSCULAR | Status: DC | PRN
  Administered 2014-11-20: 50 ug via INTRAVENOUS

## 2014-11-20 MED ORDER — MIDAZOLAM HCL 2 MG/2ML IJ SOLN
INTRAMUSCULAR | Status: DC | PRN
  Administered 2014-11-20: 2 mg via INTRAVENOUS

## 2014-11-20 MED ORDER — CEFAZOLIN SODIUM 1-5 GM-% IV SOLN
INTRAVENOUS | Status: AC
  Filled 2014-11-20: qty 50

## 2014-11-20 MED ORDER — CEFAZOLIN SODIUM 1-5 GM-% IV SOLN
1.0000 g | Freq: Once | INTRAVENOUS | Status: AC
  Administered 2014-11-20: 1 g via INTRAVENOUS

## 2014-11-20 MED ORDER — SODIUM CHLORIDE 0.9 % IR SOLN
Freq: Once | Status: AC
  Administered 2014-11-20: 12:00:00
  Filled 2014-11-20: qty 2

## 2014-11-20 SURGICAL SUPPLY — 8 items
DERMABOND ADVANCED (GAUZE/BANDAGES/DRESSINGS) ×2
DERMABOND ADVANCED .7 DNX12 (GAUZE/BANDAGES/DRESSINGS) ×1 IMPLANT
PACK ANGIOGRAPHY (CUSTOM PROCEDURE TRAY) ×3 IMPLANT
PORTACATH POWER 8F (Port) ×3 IMPLANT
SUT MON AB 4-0 PC3 18 (SUTURE) ×3 IMPLANT
SUT PROLENE 0 CT 1 30 (SUTURE) ×3 IMPLANT
SUT VICRYL+ 3-0 36IN CT-1 (SUTURE) ×3 IMPLANT
TOWEL OR 17X26 4PK STRL BLUE (TOWEL DISPOSABLE) ×3 IMPLANT

## 2014-11-20 NOTE — CV Procedure (Signed)
      [facility]       Operative Note  Date: 11/20/2014  Preoperative diagnosis:  1. Esophageal cancer  Postoperative diagnosis:  Same as above  Procedures: #1. Ultrasound guidance for vascular access to the right internal jugular vein. #2. Fluoroscopic guidance for placement of catheter. #3. Placement of CT compatible Port-A-Cath, right  internal jugular vein.  Surgeon: Leotis Pain, MD.   Anesthesia: Local with moderate conscious sedation.  Fluoroscopy time: less than 1 minute  Contrast used: 0  Estimated blood loss: 25 cc  Indication for the procedure:  Patient has esophageal cancer.  The patient needs a Port-A-Cath for durable venous access, chemotherapy, lab draws, and CT scans. We are asked to place this. Risks and benefits were discussed and informed consent was obtained.  Description of procedure: The patient was brought to the vascular and interventional radiology suite. The right neck chest and shoulder were sterilely prepped and draped, and a sterile surgical field was created. Ultrasound was used to help visualize a patent right internal jugular vein. This was then accessed under direct ultrasound guidance without difficulty with the Seldinger needle and a permanent image was recorded. A J-wire was placed. After skin nick and dilatation, the peel-away sheath was then placed over the wire. I then anesthetized an area under the clavicle approximately 1-2 fingerbreadths. A transverse incision was created and an inferior pocket was created with electrocautery and blunt dissection. The port was then brought onto the field, placed into the pocket and secured to the chest wall with 2 Prolene sutures. The catheter was connected to the port and tunneled from the subclavicular incision to the access site. Fluoroscopic guidance was then used to cut the catheter to an appropriate length. The catheter was then placed through the peel-away sheath and the peel-away sheath  was removed. The catheter tip was parked in excellent location under fluorocoscopic guidance in the cavoatrial junction area. The pocket was then irrigated with antibiotic impregnated saline and the wound was closed with a running 3-0 Vicryl and a 4-0 Monocryl. The access incision was closed with a single 4-0 Monocryl. The Huber needle was used to withdraw blood and flush the port with heparinized saline. Dermabond was then placed as a dressing. The patient tolerated the procedure well and was taken to the recovery room in stable condition.   Aarvi Stotts 11/20/2014 11:46 AM

## 2014-11-20 NOTE — H&P (Signed)
[facility] History & Physical Update  The patient was interviewed and re-examined.  The patient's previous History and Physical has been reviewed and is unchanged.  There is no change in the plan of care. We plan to proceed with the scheduled procedure.  Jeremy Stalzer, MD  11/20/2014, 9:51 AM

## 2014-11-20 NOTE — Discharge Instructions (Signed)

## 2014-11-21 ENCOUNTER — Encounter: Payer: Self-pay | Admitting: Vascular Surgery

## 2014-11-24 ENCOUNTER — Other Ambulatory Visit: Payer: Self-pay | Admitting: *Deleted

## 2014-11-24 DIAGNOSIS — C159 Malignant neoplasm of esophagus, unspecified: Secondary | ICD-10-CM

## 2014-11-25 ENCOUNTER — Inpatient Hospital Stay: Payer: Medicare Other | Attending: Oncology | Admitting: Oncology

## 2014-11-25 ENCOUNTER — Inpatient Hospital Stay: Payer: Medicare Other

## 2014-11-25 VITALS — BP 124/74 | HR 64 | Temp 97.8°F | Resp 20 | Wt 233.0 lb

## 2014-11-25 VITALS — BP 128/76 | HR 68 | Temp 98.0°F | Resp 20

## 2014-11-25 DIAGNOSIS — Z5111 Encounter for antineoplastic chemotherapy: Secondary | ICD-10-CM | POA: Insufficient documentation

## 2014-11-25 DIAGNOSIS — C159 Malignant neoplasm of esophagus, unspecified: Secondary | ICD-10-CM | POA: Insufficient documentation

## 2014-11-25 DIAGNOSIS — I10 Essential (primary) hypertension: Secondary | ICD-10-CM | POA: Insufficient documentation

## 2014-11-25 DIAGNOSIS — E860 Dehydration: Secondary | ICD-10-CM | POA: Diagnosis not present

## 2014-11-25 DIAGNOSIS — R1310 Dysphagia, unspecified: Secondary | ICD-10-CM | POA: Diagnosis not present

## 2014-11-25 DIAGNOSIS — D696 Thrombocytopenia, unspecified: Secondary | ICD-10-CM | POA: Insufficient documentation

## 2014-11-25 DIAGNOSIS — I4891 Unspecified atrial fibrillation: Secondary | ICD-10-CM | POA: Diagnosis not present

## 2014-11-25 DIAGNOSIS — G4730 Sleep apnea, unspecified: Secondary | ICD-10-CM | POA: Insufficient documentation

## 2014-11-25 DIAGNOSIS — D3500 Benign neoplasm of unspecified adrenal gland: Secondary | ICD-10-CM | POA: Diagnosis not present

## 2014-11-25 DIAGNOSIS — I2510 Atherosclerotic heart disease of native coronary artery without angina pectoris: Secondary | ICD-10-CM | POA: Diagnosis not present

## 2014-11-25 DIAGNOSIS — R634 Abnormal weight loss: Secondary | ICD-10-CM | POA: Insufficient documentation

## 2014-11-25 DIAGNOSIS — Z7901 Long term (current) use of anticoagulants: Secondary | ICD-10-CM

## 2014-11-25 DIAGNOSIS — F17210 Nicotine dependence, cigarettes, uncomplicated: Secondary | ICD-10-CM | POA: Diagnosis not present

## 2014-11-25 DIAGNOSIS — Z79899 Other long term (current) drug therapy: Secondary | ICD-10-CM | POA: Diagnosis not present

## 2014-11-25 DIAGNOSIS — R42 Dizziness and giddiness: Secondary | ICD-10-CM | POA: Diagnosis not present

## 2014-11-25 LAB — COMPREHENSIVE METABOLIC PANEL
ALK PHOS: 81 U/L (ref 38–126)
ALT: 16 U/L — ABNORMAL LOW (ref 17–63)
AST: 25 U/L (ref 15–41)
Albumin: 3.8 g/dL (ref 3.5–5.0)
Anion gap: 4 — ABNORMAL LOW (ref 5–15)
BUN: 22 mg/dL — AB (ref 6–20)
CALCIUM: 8.7 mg/dL — AB (ref 8.9–10.3)
CO2: 29 mmol/L (ref 22–32)
Chloride: 104 mmol/L (ref 101–111)
Creatinine, Ser: 0.85 mg/dL (ref 0.61–1.24)
GFR calc non Af Amer: >60 mL/min (ref >60)
Glucose, Bld: 82 mg/dL (ref 65–99)
Potassium: 3.8 mmol/L (ref 3.5–5.1)
SODIUM: 137 mmol/L (ref 135–145)
Total Bilirubin: 0.4 mg/dL (ref 0.3–1.2)
Total Protein: 6.3 g/dL — ABNORMAL LOW (ref 6.5–8.1)

## 2014-11-25 LAB — CBC WITH DIFFERENTIAL/PLATELET
Basophils Absolute: 0 10*3/uL (ref 0–0.1)
Basophils Relative: 1 %
EOS PCT: 4 %
Eosinophils Absolute: 0.2 10*3/uL (ref 0–0.7)
HEMATOCRIT: 40.3 % (ref 40.0–52.0)
Hemoglobin: 13.6 g/dL (ref 13.0–18.0)
Lymphocytes Relative: 23 %
Lymphs Abs: 0.9 10*3/uL — ABNORMAL LOW (ref 1.0–3.6)
MCH: 29.2 pg (ref 26.0–34.0)
MCHC: 33.7 g/dL (ref 32.0–36.0)
MCV: 86.6 fL (ref 80.0–100.0)
MONOS PCT: 9 %
Monocytes Absolute: 0.4 10*3/uL (ref 0.2–1.0)
Neutro Abs: 2.6 10*3/uL (ref 1.4–6.5)
Neutrophils Relative %: 63 %
Platelets: 127 10*3/uL — ABNORMAL LOW (ref 150–440)
RBC: 4.65 MIL/uL (ref 4.40–5.90)
RDW: 15.7 % — AB (ref 11.5–14.5)
WBC: 4.1 10*3/uL (ref 3.8–10.6)

## 2014-11-25 MED ORDER — SODIUM CHLORIDE 0.9 % IV SOLN
2400.0000 mg/m2 | INTRAVENOUS | Status: DC
  Administered 2014-11-25: 5400 mg via INTRAVENOUS
  Filled 2014-11-25: qty 108

## 2014-11-25 MED ORDER — ATROPINE SULFATE 0.4 MG/ML IJ SOLN
0.4000 mg | Freq: Once | INTRAMUSCULAR | Status: AC | PRN
  Administered 2014-11-25: 0.4 mg via INTRAVENOUS
  Filled 2014-11-25: qty 1

## 2014-11-25 MED ORDER — SODIUM CHLORIDE 0.9 % IV SOLN
Freq: Once | INTRAVENOUS | Status: AC
  Administered 2014-11-25: 12:00:00 via INTRAVENOUS
  Filled 2014-11-25: qty 1000

## 2014-11-25 MED ORDER — FLUOROURACIL CHEMO INJECTION 2.5 GM/50ML
400.0000 mg/m2 | Freq: Once | INTRAVENOUS | Status: AC
  Administered 2014-11-25: 900 mg via INTRAVENOUS
  Filled 2014-11-25: qty 18

## 2014-11-25 MED ORDER — SODIUM CHLORIDE 0.9 % IV SOLN
Freq: Once | INTRAVENOUS | Status: AC
  Administered 2014-11-25: 12:00:00 via INTRAVENOUS
  Filled 2014-11-25: qty 8

## 2014-11-25 MED ORDER — LEUCOVORIN CALCIUM INJECTION 350 MG
400.0000 mg/m2 | Freq: Once | INTRAMUSCULAR | Status: AC
  Administered 2014-11-25: 900 mg via INTRAVENOUS
  Filled 2014-11-25: qty 20

## 2014-11-25 MED ORDER — DEXTROSE 5 % IV SOLN
400.0000 mg | Freq: Once | INTRAVENOUS | Status: AC
  Administered 2014-11-25: 400 mg via INTRAVENOUS
  Filled 2014-11-25: qty 6.67

## 2014-11-25 NOTE — Progress Notes (Signed)
Patient here today for follow up and treatment consideration regarding esophageal cancer. Patient reports nausea, decreased appetite.

## 2014-11-25 NOTE — Progress Notes (Signed)
[place]  Telephone:([phone] Fax:(336) [phone]  ID: Jeremy Gordon OB: 29-Jan-1941  MR#: [id number]  QZE#:[id number]  Patient Care Team: No Pcp Per Patient as PCP - General (General Practice)  CHIEF COMPLAINT:  Chief Complaint  Patient presents with  . Follow-up    esophageal cancer  . Chemotherapy    INTERVAL HISTORY: Patient returns to clinic today for further evaluation and consideration of cycle 1 of FOLFIRI.  He continues to have difficulty swallowing and has lost weight in the interim. He otherwise feels well. He continues to work full-time and remain active. He has no neurologic complaints. He denies any recent fevers. He denies any chest pain, shortness of breath, or cough. He denies any nausea, vomiting, constipation, or diarrhea. He has no melena or hematochezia. He has no urinary complaints. Patient offers no further specific complaints today.  REVIEW OF SYSTEMS:   Review of Systems  Constitutional: Negative.   Respiratory: Negative.   Cardiovascular: Negative.   Gastrointestinal: Negative.     As per HPI. Otherwise, a complete review of systems is negatve.  PAST MEDICAL HISTORY:  Hypertension, sleep apnea, atrial fibrillation.  PAST SURGICAL HISTORY: Negative.  FAMILY HISTORY: Reviewed and unchanged. No report of malignancy or chronic disease.     ADVANCED DIRECTIVES:    HEALTH MAINTENANCE: History  Substance Use Topics  . Smoking status: Current Every Day Smoker -- 62 years  . Smokeless tobacco: Current User    Types: Snuff  . Alcohol Use: No     Colonoscopy:  PAP:  Bone density:  Lipid panel:  No Known Allergies  Current Outpatient Prescriptions  Medication Sig Dispense Refill  . albuterol (PROAIR HFA) 108 (90 BASE) MCG/ACT inhaler Inhale 2 puffs into the lungs every 4 (four) hours as needed for wheezing.     . diltiazem (DILACOR XR) 120 MG 24 hr capsule Take 120 mg by mouth daily.     . enalapril (VASOTEC) 20 MG  tablet Take 20 mg by mouth 2 (two) times daily.     . finasteride (PROSCAR) 5 MG tablet Take 5 mg by mouth daily.     . fluticasone (FLONASE) 50 MCG/ACT nasal spray 1 spray by Each Nare route nightly. Frequency:QD   Dosage:50   MCG  Instructions:  Note:Dose: 57 MCG    . furosemide (LASIX) 20 MG tablet Take 20 mg by mouth daily.     Marland Kitchen gabapentin (NEURONTIN) 300 MG capsule Take 300 mg by mouth 2 (two) times daily.     Marland Kitchen HYDROcodone-acetaminophen (LORTAB) 10-500 MG per tablet Take by mouth every 8 (eight) hours as needed for pain.     . metoprolol (LOPRESSOR) 50 MG tablet Take 50 mg by mouth 2 (two) times daily.     Marland Kitchen omeprazole (PRILOSEC) 20 MG capsule TAKE ONE (1) CAPSULE BY MOUTH 2 TIMES DAILY    . prochlorperazine (COMPAZINE) 10 MG tablet Take 10 mg by mouth every 6 (six) hours as needed for nausea or vomiting.    . tamsulosin (FLOMAX) 0.4 MG CAPS capsule Take 0.4 mg by mouth daily.     Marland Kitchen warfarin (COUMADIN) 4 MG tablet Take 12 mg by mouth daily at 6 PM.     . sucralfate (CARAFATE) 1 GM/10ML suspension Take by mouth.     No current facility-administered medications for this visit.    OBJECTIVE: Filed Vitals:   11/25/14 1026  BP: 124/74  Pulse: 64  Temp: 97.8 F (36.6 C)  Resp: 20  Body mass index is 36.48 kg/(m^2).    ECOG FS:0 - Asymptomatic  General: Well-developed, well-nourished, no acute distress. Eyes: anicteric sclera. HEENT: Normocephalic, moist mucous membranes, clear oropharnyx. Lungs: Clear to auscultation bilaterally. Heart: Regular rate and rhythm. No rubs, murmurs, or gallops. Abdomen: Soft, nontender, nondistended. No organomegaly noted, normoactive bowel sounds. Musculoskeletal: No edema, cyanosis, or clubbing. Neuro: Alert, answering all questions appropriately. Cranial nerves grossly intact. Skin: No rashes or petechiae noted. Psych: Normal affect.   LAB RESULTS:  Lab Results  Component Value Date   NA 136 07/16/2014   K 4.2 07/16/2014   CL 102  07/16/2014   CO2 31 07/16/2014   GLUCOSE 98 07/16/2014   BUN 20 07/16/2014   CREATININE 1.10 07/16/2014   CALCIUM 9.0 07/16/2014   PROT 6.5 07/16/2014   ALBUMIN 3.8 07/16/2014   AST 27 07/16/2014   ALT 17 07/16/2014   ALKPHOS 79 07/16/2014   BILITOT 0.7 07/16/2014   GFRNONAA >60 07/16/2014   GFRAA >60 07/16/2014    Lab Results  Component Value Date   WBC 4.1 11/25/2014   NEUTROABS 2.6 11/25/2014   HGB 13.6 11/25/2014   HCT 40.3 11/25/2014   MCV 86.6 11/25/2014   PLT 127* 11/25/2014     STUDIES: Ct Soft Tissue Neck W Contrast  11/10/2014   CLINICAL DATA:  Esophageal cancer, status post chemotherapy, for restaging  EXAM: CT NECK AND CHEST WITH CONTRAST  TECHNIQUE: Multidetector CT imaging of the neck and chest was performed following the standard protocol during bolus administration of intravenous contrast.  CONTRAST:  56m OMNIPAQUE IOHEXOL 300 MG/ML  SOLN  COMPARISON:  None.  FINDINGS: CT NECK FINDINGS  Pharynx and larynx: Within normal limits.  Salivary glands: Within normal limits.  Thyroid: Within normal limits.  Lymph nodes: No suspicious cervical lymphadenopathy.  Vascular: Atherosclerotic calcifications. Otherwise within normal limits.  Limited intracranial: Within normal limits.  Visualized orbits: Visualize globes and retroconal soft tissues are within normal limits.  Mastoids and visualized paranasal sinuses: Visualized paranasal sinuses are essentially clear. Opacification of the bilateral mastoid air cells, left greater than right, likely chronic.  Skeleton: Degenerative changes of the mid cervical spine.  CT CHEST FINDINGS  Mediastinum/Nodes: Mild cardiomegaly.  No pericardial effusion.  Coronary atherosclerosis.  Atherosclerotic calcifications of the aortic root and aortic arch.  Concentric wall thickening involving the distal esophagus esophagus, measuring up to 2.1 cm in thickness (series 2/ image 45), corresponding to known esophageal cancer just above the GE junction.   Small mediastinal lymph nodes, including 7 mm short axis low right paratracheal nodes (series 2/ image 20) and 7 mm short axis subcarinal nodes (series 2/ image 26), indeterminate.  Lungs/Pleura: No suspicious pulmonary nodules.  Mild centrilobular and paraseptal emphysematous changes.  Mild dependent atelectasis in the bilateral lower lobes. No focal consolidation.  No pleural effusion or pneumothorax.  Upper Abdomen: Visualized upper abdomen is notable for vascular calcifications and mild low-density nodular thickening of the left adrenal gland (series 7/image 84), likely reflecting a 12 mm benign adrenal adenoma.  Musculoskeletal: Degenerative changes the visualized thoracolumbar spine.  IMPRESSION: Concentric wall thickening of the lower esophagus just above the GE junction, corresponding to known esophageal cancer.  Small lower mediastinal lymph nodes measuring up to 7 mm short axis, indeterminate.  Suspected 12 mm left adrenal adenoma, benign.  No findings specific for metastatic disease in the neck/chest.   Electronically Signed   By: SJulian HyM.D.   On: 11/10/2014 12:18   Ct Chest W  Contrast  11/10/2014   CLINICAL DATA:  Esophageal cancer, status post chemotherapy, for restaging  EXAM: CT NECK AND CHEST WITH CONTRAST  TECHNIQUE: Multidetector CT imaging of the neck and chest was performed following the standard protocol during bolus administration of intravenous contrast.  CONTRAST:  38m OMNIPAQUE IOHEXOL 300 MG/ML  SOLN  COMPARISON:  None.  FINDINGS: CT NECK FINDINGS  Pharynx and larynx: Within normal limits.  Salivary glands: Within normal limits.  Thyroid: Within normal limits.  Lymph nodes: No suspicious cervical lymphadenopathy.  Vascular: Atherosclerotic calcifications. Otherwise within normal limits.  Limited intracranial: Within normal limits.  Visualized orbits: Visualize globes and retroconal soft tissues are within normal limits.  Mastoids and visualized paranasal sinuses: Visualized  paranasal sinuses are essentially clear. Opacification of the bilateral mastoid air cells, left greater than right, likely chronic.  Skeleton: Degenerative changes of the mid cervical spine.  CT CHEST FINDINGS  Mediastinum/Nodes: Mild cardiomegaly.  No pericardial effusion.  Coronary atherosclerosis.  Atherosclerotic calcifications of the aortic root and aortic arch.  Concentric wall thickening involving the distal esophagus esophagus, measuring up to 2.1 cm in thickness (series 2/ image 45), corresponding to known esophageal cancer just above the GE junction.  Small mediastinal lymph nodes, including 7 mm short axis low right paratracheal nodes (series 2/ image 20) and 7 mm short axis subcarinal nodes (series 2/ image 26), indeterminate.  Lungs/Pleura: No suspicious pulmonary nodules.  Mild centrilobular and paraseptal emphysematous changes.  Mild dependent atelectasis in the bilateral lower lobes. No focal consolidation.  No pleural effusion or pneumothorax.  Upper Abdomen: Visualized upper abdomen is notable for vascular calcifications and mild low-density nodular thickening of the left adrenal gland (series 7/image 84), likely reflecting a 12 mm benign adrenal adenoma.  Musculoskeletal: Degenerative changes the visualized thoracolumbar spine.  IMPRESSION: Concentric wall thickening of the lower esophagus just above the GE junction, corresponding to known esophageal cancer.  Small lower mediastinal lymph nodes measuring up to 7 mm short axis, indeterminate.  Suspected 12 mm left adrenal adenoma, benign.  No findings specific for metastatic disease in the neck/chest.   Electronically Signed   By: SJulian HyM.D.   On: 11/10/2014 12:18    ASSESSMENT: Recurrent adenocarcinoma of the esophagus, HER-2 negative.  PLAN:    1. Esophageal cancer: CT as above reviewed independently. [facility]. After lengthy discussion, patient  wishes to pursue palliative chemotherapy. Proceed with cycle 1 of FOLFIRI today. Plan to give treatment every 2 weeks with reimaging after approximately 6 cycles. PEG tube is also discussed to help improve his nutrition, but patient continues to refuse this procedure at this time.  Return to clinic in 2 days for pump removal, 1 week for laboratory work, and in 2 weeks for consideration of cycle 2.  2. Atrial fibrillation: Continue Coumadin as prescribed. INRs are monitored by his primary care physician. 3. Thrombocytopenia: Secondary to chemotherapy. Monitor. 4. Difficulty swallowing: Secondary to persistent malignancy. Patient has refused PEG tube placement.  Patient expressed understanding and was in agreement with this plan. He also understands that He can call clinic at any time with any questions, concerns, or complaints.    TLloyd Huger MD   11/25/2014 11:04 AM

## 2014-11-27 ENCOUNTER — Inpatient Hospital Stay: Payer: Medicare Other

## 2014-11-27 DIAGNOSIS — C159 Malignant neoplasm of esophagus, unspecified: Secondary | ICD-10-CM | POA: Diagnosis not present

## 2014-11-27 DIAGNOSIS — C801 Malignant (primary) neoplasm, unspecified: Secondary | ICD-10-CM

## 2014-11-27 MED ORDER — HEPARIN SOD (PORK) LOCK FLUSH 100 UNIT/ML IV SOLN
500.0000 [IU] | Freq: Once | INTRAVENOUS | Status: AC
  Administered 2014-11-27: 500 [IU] via INTRAVENOUS
  Filled 2014-11-27: qty 5

## 2014-11-27 MED ORDER — SODIUM CHLORIDE 0.9 % IJ SOLN
10.0000 mL | Freq: Once | INTRAMUSCULAR | Status: AC
  Administered 2014-11-27: 10 mL via INTRAVENOUS
  Filled 2014-11-27: qty 10

## 2014-12-02 ENCOUNTER — Inpatient Hospital Stay: Payer: Medicare Other

## 2014-12-02 DIAGNOSIS — C159 Malignant neoplasm of esophagus, unspecified: Secondary | ICD-10-CM

## 2014-12-02 LAB — CBC WITH DIFFERENTIAL/PLATELET
Basophils Absolute: 0 10*3/uL (ref 0–0.1)
Basophils Relative: 1 %
EOS ABS: 0.1 10*3/uL (ref 0–0.7)
EOS PCT: 3 %
HCT: 44.2 % (ref 40.0–52.0)
Hemoglobin: 14.9 g/dL (ref 13.0–18.0)
LYMPHS PCT: 29 %
Lymphs Abs: 1.1 10*3/uL (ref 1.0–3.6)
MCH: 29 pg (ref 26.0–34.0)
MCHC: 33.7 g/dL (ref 32.0–36.0)
MCV: 86.1 fL (ref 80.0–100.0)
MONO ABS: 0.2 10*3/uL (ref 0.2–1.0)
Monocytes Relative: 6 %
Neutro Abs: 2.4 10*3/uL (ref 1.4–6.5)
Neutrophils Relative %: 61 %
PLATELETS: 118 10*3/uL — AB (ref 150–440)
RBC: 5.14 MIL/uL (ref 4.40–5.90)
RDW: 15.7 % — AB (ref 11.5–14.5)
WBC: 3.9 10*3/uL (ref 3.8–10.6)

## 2014-12-08 ENCOUNTER — Inpatient Hospital Stay: Payer: Medicare Other

## 2014-12-08 ENCOUNTER — Other Ambulatory Visit: Payer: Self-pay | Admitting: *Deleted

## 2014-12-08 ENCOUNTER — Telehealth: Payer: Self-pay | Admitting: *Deleted

## 2014-12-08 VITALS — BP 110/68 | HR 80 | Resp 20

## 2014-12-08 DIAGNOSIS — C159 Malignant neoplasm of esophagus, unspecified: Secondary | ICD-10-CM

## 2014-12-08 LAB — COMPREHENSIVE METABOLIC PANEL
ALBUMIN: 4.2 g/dL (ref 3.5–5.0)
ALT: 25 U/L (ref 17–63)
AST: 31 U/L (ref 15–41)
Alkaline Phosphatase: 88 U/L (ref 38–126)
Anion gap: 6 (ref 5–15)
BUN: 19 mg/dL (ref 6–20)
CO2: 29 mmol/L (ref 22–32)
Calcium: 9.1 mg/dL (ref 8.9–10.3)
Chloride: 101 mmol/L (ref 101–111)
Creatinine, Ser: 1.19 mg/dL (ref 0.61–1.24)
GFR calc Af Amer: >60 mL/min (ref >60)
GFR calc non Af Amer: 58 mL/min — ABNORMAL LOW (ref >60)
GLUCOSE: 109 mg/dL — AB (ref 65–99)
POTASSIUM: 4 mmol/L (ref 3.5–5.1)
SODIUM: 136 mmol/L (ref 135–145)
Total Bilirubin: 0.9 mg/dL (ref 0.3–1.2)
Total Protein: 7 g/dL (ref 6.5–8.1)

## 2014-12-08 LAB — CBC WITH DIFFERENTIAL/PLATELET
BASOS ABS: 0 10*3/uL (ref 0–0.1)
BASOS PCT: 0 %
EOS PCT: 3 %
Eosinophils Absolute: 0.1 10*3/uL (ref 0–0.7)
HCT: 42.8 % (ref 40.0–52.0)
Hemoglobin: 14.5 g/dL (ref 13.0–18.0)
Lymphocytes Relative: 29 %
Lymphs Abs: 0.9 10*3/uL — ABNORMAL LOW (ref 1.0–3.6)
MCH: 29.1 pg (ref 26.0–34.0)
MCHC: 33.9 g/dL (ref 32.0–36.0)
MCV: 85.7 fL (ref 80.0–100.0)
MONO ABS: 0.3 10*3/uL (ref 0.2–1.0)
Monocytes Relative: 10 %
Neutro Abs: 1.9 10*3/uL (ref 1.4–6.5)
Neutrophils Relative %: 58 %
PLATELETS: 158 10*3/uL (ref 150–440)
RBC: 5 MIL/uL (ref 4.40–5.90)
RDW: 15.5 % — AB (ref 11.5–14.5)
WBC: 3.3 10*3/uL — AB (ref 3.8–10.6)

## 2014-12-08 MED ORDER — HEPARIN SOD (PORK) LOCK FLUSH 100 UNIT/ML IV SOLN: 500.0000 [IU] | Freq: Once | INTRAVENOUS | Status: DC

## 2014-12-08 MED ORDER — SODIUM CHLORIDE 0.9 % IV SOLN
Freq: Once | INTRAVENOUS | Status: AC
  Administered 2014-12-08: 15:00:00 via INTRAVENOUS
  Filled 2014-12-08: qty 1000

## 2014-12-08 MED ORDER — SODIUM CHLORIDE 0.9 % IV SOLN
Freq: Once | INTRAVENOUS | Status: AC
  Administered 2014-12-08: 15:00:00 via INTRAVENOUS
  Filled 2014-12-08: qty 4

## 2014-12-08 MED ORDER — HEPARIN SOD (PORK) LOCK FLUSH 100 UNIT/ML IV SOLN
INTRAVENOUS | Status: AC
  Filled 2014-12-08: qty 5

## 2014-12-08 NOTE — Telephone Encounter (Signed)
Schedule order entered for patient to come in today for 1 hour IVF, zofran and decadron.

## 2014-12-08 NOTE — Telephone Encounter (Signed)
Pt has been vomiting since yesterday. Unable to keep anything down. Wants to know if needs to be seen.

## 2014-12-09 ENCOUNTER — Inpatient Hospital Stay: Payer: Medicare Other

## 2014-12-09 ENCOUNTER — Inpatient Hospital Stay (HOSPITAL_BASED_OUTPATIENT_CLINIC_OR_DEPARTMENT_OTHER): Payer: Medicare Other | Admitting: Oncology

## 2014-12-09 VITALS — BP 114/81 | HR 84 | Temp 96.1°F | Resp 20 | Wt 225.1 lb

## 2014-12-09 DIAGNOSIS — I10 Essential (primary) hypertension: Secondary | ICD-10-CM

## 2014-12-09 DIAGNOSIS — C159 Malignant neoplasm of esophagus, unspecified: Secondary | ICD-10-CM | POA: Diagnosis not present

## 2014-12-09 DIAGNOSIS — R634 Abnormal weight loss: Secondary | ICD-10-CM

## 2014-12-09 DIAGNOSIS — Z79899 Other long term (current) drug therapy: Secondary | ICD-10-CM

## 2014-12-09 DIAGNOSIS — R42 Dizziness and giddiness: Secondary | ICD-10-CM

## 2014-12-09 DIAGNOSIS — I2510 Atherosclerotic heart disease of native coronary artery without angina pectoris: Secondary | ICD-10-CM

## 2014-12-09 DIAGNOSIS — Z7901 Long term (current) use of anticoagulants: Secondary | ICD-10-CM

## 2014-12-09 DIAGNOSIS — D3500 Benign neoplasm of unspecified adrenal gland: Secondary | ICD-10-CM

## 2014-12-09 DIAGNOSIS — I4891 Unspecified atrial fibrillation: Secondary | ICD-10-CM

## 2014-12-09 DIAGNOSIS — R1310 Dysphagia, unspecified: Secondary | ICD-10-CM | POA: Diagnosis not present

## 2014-12-09 DIAGNOSIS — F17210 Nicotine dependence, cigarettes, uncomplicated: Secondary | ICD-10-CM

## 2014-12-09 DIAGNOSIS — E860 Dehydration: Secondary | ICD-10-CM | POA: Diagnosis not present

## 2014-12-09 DIAGNOSIS — G4730 Sleep apnea, unspecified: Secondary | ICD-10-CM

## 2014-12-09 DIAGNOSIS — D696 Thrombocytopenia, unspecified: Secondary | ICD-10-CM

## 2014-12-09 MED ORDER — FLUOROURACIL CHEMO INJECTION 2.5 GM/50ML
400.0000 mg/m2 | Freq: Once | INTRAVENOUS | Status: AC
  Administered 2014-12-09: 900 mg via INTRAVENOUS
  Filled 2014-12-09: qty 18

## 2014-12-09 MED ORDER — LEUCOVORIN CALCIUM INJECTION 350 MG
400.0000 mg/m2 | Freq: Once | INTRAMUSCULAR | Status: AC
  Administered 2014-12-09: 900 mg via INTRAVENOUS
  Filled 2014-12-09: qty 45

## 2014-12-09 MED ORDER — SODIUM CHLORIDE 0.9 % IV SOLN
Freq: Once | INTRAVENOUS | Status: AC
  Administered 2014-12-09: 10:00:00 via INTRAVENOUS
  Filled 2014-12-09: qty 8

## 2014-12-09 MED ORDER — ATROPINE SULFATE 0.4 MG/ML IJ SOLN
0.4000 mg | Freq: Once | INTRAMUSCULAR | Status: AC | PRN
  Administered 2014-12-09: 0.4 mg via INTRAVENOUS
  Filled 2014-12-09: qty 1

## 2014-12-09 MED ORDER — SODIUM CHLORIDE 0.9 % IV SOLN
2400.0000 mg/m2 | INTRAVENOUS | Status: DC
  Administered 2014-12-09: 5400 mg via INTRAVENOUS
  Filled 2014-12-09: qty 100

## 2014-12-09 MED ORDER — HEPARIN SOD (PORK) LOCK FLUSH 100 UNIT/ML IV SOLN
500.0000 [IU] | Freq: Once | INTRAVENOUS | Status: DC | PRN
Start: 2014-12-09 — End: 2014-12-09

## 2014-12-09 MED ORDER — IRINOTECAN HCL CHEMO INJECTION 100 MG/5ML
400.0000 mg | Freq: Once | INTRAVENOUS | Status: AC
  Administered 2014-12-09: 400 mg via INTRAVENOUS
  Filled 2014-12-09: qty 6.67

## 2014-12-09 MED ORDER — SODIUM CHLORIDE 0.9 % IV SOLN
Freq: Once | INTRAVENOUS | Status: AC
  Administered 2014-12-09: 10:00:00 via INTRAVENOUS
  Filled 2014-12-09: qty 1000

## 2014-12-09 MED ORDER — SODIUM CHLORIDE 0.9 % IJ SOLN
10.0000 mL | INTRAMUSCULAR | Status: DC | PRN
  Administered 2014-12-09: 10 mL
  Filled 2014-12-09: qty 10

## 2014-12-09 NOTE — Progress Notes (Signed)
Patient has trouble swallowing and reports after his last chemotherapy the swallowing was not as painful.

## 2014-12-11 ENCOUNTER — Inpatient Hospital Stay: Payer: Medicare Other

## 2014-12-11 VITALS — BP 127/92 | HR 89 | Temp 97.0°F | Resp 20

## 2014-12-11 DIAGNOSIS — C159 Malignant neoplasm of esophagus, unspecified: Secondary | ICD-10-CM | POA: Diagnosis not present

## 2014-12-11 DIAGNOSIS — C801 Malignant (primary) neoplasm, unspecified: Secondary | ICD-10-CM

## 2014-12-11 MED ORDER — HEPARIN SOD (PORK) LOCK FLUSH 100 UNIT/ML IV SOLN
500.0000 [IU] | Freq: Once | INTRAVENOUS | Status: AC
  Administered 2014-12-11: 500 [IU] via INTRAVENOUS

## 2014-12-11 MED ORDER — SODIUM CHLORIDE 0.9 % IJ SOLN
10.0000 mL | INTRAMUSCULAR | Status: DC | PRN
  Administered 2014-12-11: 10 mL via INTRAVENOUS
  Filled 2014-12-11: qty 10

## 2014-12-11 MED ORDER — HEPARIN SOD (PORK) LOCK FLUSH 100 UNIT/ML IV SOLN
INTRAVENOUS | Status: AC
  Filled 2014-12-11: qty 5

## 2014-12-15 NOTE — Progress Notes (Signed)
[place]  Telephone:(336) [phone] Fax:(336) [phone]  ID: Jeremy Gordon OB: May 15, 1940  MR#: [id number]  KTG#:[id number]  Patient Care Team: No Pcp Per Patient as PCP - General (General Practice)  CHIEF COMPLAINT:  Chief Complaint  Patient presents with  . Follow-up    esophageal cancer    INTERVAL HISTORY: Patient returns to clinic today for further evaluation and consideration of cycle 2 of FOLFIRI.  He was in clinic yesterday to receive IV fluids for dizziness and dehydration. He feels improved, but not back to his baseline. He continues to have difficulty swallowing and has lost weight in the interim. He otherwise feels well. He has no neurologic complaints. He denies any recent fevers. He denies any chest pain, shortness of breath, or cough. He denies any nausea, vomiting, constipation, or diarrhea. He has no melena or hematochezia. He has no urinary complaints. Patient offers no further specific complaints today.  REVIEW OF SYSTEMS:   Review of Systems  Constitutional: Positive for weight loss. Negative for fever.  Respiratory: Negative.   Cardiovascular: Negative.   Gastrointestinal: Negative.     As per HPI. Otherwise, a complete review of systems is negatve.  PAST MEDICAL HISTORY:  Hypertension, sleep apnea, atrial fibrillation.  PAST SURGICAL HISTORY: Negative.  FAMILY HISTORY: Reviewed and unchanged. No report of malignancy or chronic disease.     ADVANCED DIRECTIVES:    HEALTH MAINTENANCE: Social History  Substance Use Topics  . Smoking status: Current Every Day Smoker -- 62 years  . Smokeless tobacco: Current User    Types: Snuff  . Alcohol Use: No     Colonoscopy:  PAP:  Bone density:  Lipid panel:  No Known Allergies  Current Outpatient Prescriptions  Medication Sig Dispense Refill  . albuterol (PROAIR HFA) 108 (90 BASE) MCG/ACT inhaler Inhale 2 puffs into the lungs every 4 (four) hours as needed for wheezing.     .  diltiazem (DILACOR XR) 120 MG 24 hr capsule Take 120 mg by mouth daily.     . enalapril (VASOTEC) 20 MG tablet Take 20 mg by mouth 2 (two) times daily.     . finasteride (PROSCAR) 5 MG tablet Take 5 mg by mouth daily.     . fluticasone (FLONASE) 50 MCG/ACT nasal spray 1 spray by Each Nare route nightly. Frequency:QD   Dosage:50   MCG  Instructions:  Note:Dose: 68 MCG    . furosemide (LASIX) 20 MG tablet Take 20 mg by mouth daily.     Marland Kitchen gabapentin (NEURONTIN) 300 MG capsule Take 300 mg by mouth 2 (two) times daily.     Marland Kitchen HYDROcodone-acetaminophen (LORTAB) 10-500 MG per tablet Take by mouth every 8 (eight) hours as needed for pain.     . metoprolol (LOPRESSOR) 50 MG tablet Take 50 mg by mouth 2 (two) times daily.     Marland Kitchen nystatin (MYCOSTATIN) 100000 UNIT/ML suspension     . omeprazole (PRILOSEC) 20 MG capsule TAKE ONE (1) CAPSULE BY MOUTH 2 TIMES DAILY    . prochlorperazine (COMPAZINE) 10 MG tablet Take 10 mg by mouth every 6 (six) hours as needed for nausea or vomiting.    . tamsulosin (FLOMAX) 0.4 MG CAPS capsule Take 0.4 mg by mouth daily.     Marland Kitchen warfarin (COUMADIN) 4 MG tablet Take 12 mg by mouth daily at 6 PM.     . sucralfate (CARAFATE) 1 GM/10ML suspension Take by mouth.     No current facility-administered medications for this visit.  OBJECTIVE: Filed Vitals:   12/09/14 0918  BP: 114/81  Pulse: 84  Temp: 96.1 F (35.6 C)  Resp: 20     Body mass index is 35.25 kg/(m^2).    ECOG FS:0 - Asymptomatic  General: Well-developed, well-nourished, no acute distress. Eyes: anicteric sclera. Lungs: Clear to auscultation bilaterally. Heart: Regular rate and rhythm. No rubs, murmurs, or gallops. Abdomen: Soft, nontender, nondistended. No organomegaly noted, normoactive bowel sounds. Musculoskeletal: No edema, cyanosis, or clubbing. Neuro: Alert, answering all questions appropriately. Cranial nerves grossly intact. Skin: No rashes or petechiae noted. Psych: Normal affect.   LAB  RESULTS:  Lab Results  Component Value Date   NA 136 12/08/2014   K 4.0 12/08/2014   CL 101 12/08/2014   CO2 29 12/08/2014   GLUCOSE 109* 12/08/2014   BUN 19 12/08/2014   CREATININE 1.19 12/08/2014   CALCIUM 9.1 12/08/2014   PROT 7.0 12/08/2014   ALBUMIN 4.2 12/08/2014   AST 31 12/08/2014   ALT 25 12/08/2014   ALKPHOS 88 12/08/2014   BILITOT 0.9 12/08/2014   GFRNONAA 58* 12/08/2014   GFRAA >60 12/08/2014    Lab Results  Component Value Date   WBC 3.3* 12/08/2014   NEUTROABS 1.9 12/08/2014   HGB 14.5 12/08/2014   HCT 42.8 12/08/2014   MCV 85.7 12/08/2014   PLT 158 12/08/2014     STUDIES: No results found.  ASSESSMENT: Recurrent adenocarcinoma of the esophagus, HER-2 negative.  PLAN:    1. Esophageal cancer: CT reviewed independently. Patient also had recent evaluation [facility] documenting his persistent or recurrent disease. After lengthy discussion, patient wishes to pursue palliative chemotherapy. Proceed with cycle 2 of FOLFIRI today. Plan to give treatment every 2 weeks with reimaging after approximately 6 cycles. PEG tube is also discussed to help improve his nutrition, but patient continues to refuse this procedure at this time.  Return to clinic in 2 days for pump removal and in 2 weeks for consideration of cycle 3.  2. Atrial fibrillation: Continue Coumadin as prescribed. INRs are monitored by his primary care physician. 3. Thrombocytopenia: Resolved. Secondary to chemotherapy. Monitor. 4. Difficulty swallowing: Secondary to persistent malignancy. Patient has refused PEG tube placement. 5. Dehydration: Patient does not wish to be scheduled for IV fluids, but will call clinic as needed.  Patient expressed understanding and was in agreement with this plan. He also understands that He can call clinic at any time with any questions, concerns, or complaints.    Lloyd Huger, MD   12/15/2014 10:30 AM

## 2014-12-16 ENCOUNTER — Encounter: Payer: Self-pay | Admitting: Emergency Medicine

## 2014-12-16 ENCOUNTER — Emergency Department
Admission: EM | Admit: 2014-12-16 | Discharge: 2014-12-16 | Disposition: A | Discharge status: Home / Self Care | Payer: Medicare Other | Attending: Emergency Medicine | Admitting: Emergency Medicine

## 2014-12-16 DIAGNOSIS — Z7901 Long term (current) use of anticoagulants: Secondary | ICD-10-CM | POA: Diagnosis not present

## 2014-12-16 DIAGNOSIS — Z87891 Personal history of nicotine dependence: Secondary | ICD-10-CM | POA: Insufficient documentation

## 2014-12-16 DIAGNOSIS — Z7951 Long term (current) use of inhaled steroids: Secondary | ICD-10-CM | POA: Insufficient documentation

## 2014-12-16 DIAGNOSIS — I4891 Unspecified atrial fibrillation: Secondary | ICD-10-CM | POA: Insufficient documentation

## 2014-12-16 DIAGNOSIS — R791 Abnormal coagulation profile: Secondary | ICD-10-CM | POA: Insufficient documentation

## 2014-12-16 DIAGNOSIS — I10 Essential (primary) hypertension: Secondary | ICD-10-CM | POA: Insufficient documentation

## 2014-12-16 DIAGNOSIS — C159 Malignant neoplasm of esophagus, unspecified: Secondary | ICD-10-CM | POA: Diagnosis not present

## 2014-12-16 DIAGNOSIS — Z79899 Other long term (current) drug therapy: Secondary | ICD-10-CM | POA: Insufficient documentation

## 2014-12-16 DIAGNOSIS — R040 Epistaxis: Secondary | ICD-10-CM | POA: Insufficient documentation

## 2014-12-16 LAB — CBC WITH DIFFERENTIAL/PLATELET
BASOS ABS: 0 10*3/uL (ref 0–0.1)
Basophils Relative: 2 %
Eosinophils Absolute: 0 10*3/uL (ref 0–0.7)
Eosinophils Relative: 3 %
HCT: 38.9 % — ABNORMAL LOW (ref 40.0–52.0)
Hemoglobin: 13.2 g/dL (ref 13.0–18.0)
LYMPHS PCT: 63 %
Lymphs Abs: 0.9 10*3/uL — ABNORMAL LOW (ref 1.0–3.6)
MCH: 29.2 pg (ref 26.0–34.0)
MCHC: 34 g/dL (ref 32.0–36.0)
MCV: 85.8 fL (ref 80.0–100.0)
MONOS PCT: 11 %
Monocytes Absolute: 0.2 10*3/uL (ref 0.2–1.0)
NEUTROS ABS: 0.3 10*3/uL — AB (ref 1.4–6.5)
NEUTROS PCT: 21 %
PLATELETS: 103 10*3/uL — AB (ref 150–440)
RBC: 4.53 MIL/uL (ref 4.40–5.90)
RDW: 15.6 % — ABNORMAL HIGH (ref 11.5–14.5)
SMEAR REVIEW: DECREASED
WBC: 1.4 10*3/uL — CL (ref 3.8–10.6)

## 2014-12-16 LAB — BASIC METABOLIC PANEL
ANION GAP: 8 (ref 5–15)
BUN: 27 mg/dL — ABNORMAL HIGH (ref 6–20)
CO2: 28 mmol/L (ref 22–32)
Calcium: 9 mg/dL (ref 8.9–10.3)
Chloride: 104 mmol/L (ref 101–111)
Creatinine, Ser: 1.04 mg/dL (ref 0.61–1.24)
GFR calc Af Amer: >60 mL/min (ref >60)
GLUCOSE: 106 mg/dL — AB (ref 65–99)
POTASSIUM: 4.1 mmol/L (ref 3.5–5.1)
Sodium: 140 mmol/L (ref 135–145)

## 2014-12-16 LAB — APTT: aPTT: 61 seconds — ABNORMAL HIGH (ref 24–36)

## 2014-12-16 LAB — PROTIME-INR
INR: 8.15
Prothrombin Time: 67.3 seconds — ABNORMAL HIGH (ref 11.4–15.0)

## 2014-12-16 MED ORDER — TRANEXAMIC ACID 1000 MG/10ML IV SOLN
500.0000 mg | INTRAVENOUS | Status: AC
  Administered 2014-12-16: 500 mg via TOPICAL
  Filled 2014-12-16: qty 10

## 2014-12-16 MED ORDER — AMOXICILLIN 500 MG PO TABS: 500.0000 mg | ORAL_TABLET | Freq: Two times a day (BID) | ORAL | Status: DC

## 2014-12-16 NOTE — ED Notes (Signed)
Reports nose bleed onset 4am

## 2014-12-16 NOTE — ED Notes (Signed)
MD Joni Fears at bedside for packing nose.

## 2014-12-16 NOTE — ED Notes (Signed)
Nose clamp applied to control bleeding.

## 2014-12-16 NOTE — ED Provider Notes (Signed)
[facility] Emergency Department Provider Note  ____________________________________________  Time seen: 8:30 AM  I have reviewed the triage vital signs and the nursing notes.   HISTORY  Chief Complaint Epistaxis    HPI Jeremy Gordon is a 74 y.o. male with esophageal cancer on Coumadin who complains of epistaxis that started at 4:00 AM. It continued until the morning around 7:00 when his family noticed and stuck the right nostril with gauze and sent him to the ED. Denies any difficulty breathing chest pain. Denies any easy bruising or bleeding otherwise. He actually took less than his usual dose of Coumadin last night. No falls or trauma.  [place] lot of [place] and exposed to lots of pollens and having some seasonal allergies. He may have been manipulating his anterior nose digitally and causing small localized trauma.     Past Medical History  Diagnosis Date  . Cancer   . Hypertension   . Esophageal carcinoma   . Sleep apnea   . Atrial fibrillation      Patient Active Problem List   Diagnosis Date Noted  . Esophageal cancer 11/19/2014     Past Surgical History  Procedure Laterality Date  . Peripheral vascular catheterization N/A 11/20/2014    Procedure: Glori Luis Cath Insertion;  Surgeon: Algernon Huxley, MD;  Location: [place] [facility];  Service: Cardiovascular;  Laterality: N/A;     Current Outpatient Rx  Name  Route  Sig  Dispense  Refill  . albuterol (PROAIR HFA) 108 (90 BASE) MCG/ACT inhaler   Inhalation   Inhale 2 puffs into the lungs every 4 (four) hours as needed for wheezing.          . diltiazem (DILACOR XR) 120 MG 24 hr capsule   Oral   Take 120 mg by mouth daily.          . enalapril (VASOTEC) 20 MG tablet   Oral   Take 20 mg by mouth 2 (two) times daily.          . finasteride (PROSCAR) 5 MG tablet   Oral   Take 5 mg by mouth daily.          . fluticasone (FLONASE) 50 MCG/ACT  nasal spray      1 spray by Each Nare route nightly. Frequency:QD   Dosage:50   MCG  Instructions:  Note:Dose: 39 MCG         . furosemide (LASIX) 20 MG tablet   Oral   Take 20 mg by mouth daily.          Marland Kitchen gabapentin (NEURONTIN) 300 MG capsule   Oral   Take 300 mg by mouth 2 (two) times daily.          Marland Kitchen HYDROcodone-acetaminophen (LORTAB) 10-500 MG per tablet   Oral   Take by mouth every 8 (eight) hours as needed for pain.          . metoprolol (LOPRESSOR) 50 MG tablet   Oral   Take 50 mg by mouth 2 (two) times daily.          Marland Kitchen nystatin (MYCOSTATIN) 100000 UNIT/ML suspension               . omeprazole (PRILOSEC) 20 MG capsule      TAKE ONE (1) CAPSULE BY MOUTH 2 TIMES DAILY         . prochlorperazine (COMPAZINE) 10 MG tablet   Oral  Take 10 mg by mouth every 6 (six) hours as needed for nausea or vomiting.         Marland Kitchen EXPIRED: sucralfate (CARAFATE) 1 GM/10ML suspension   Oral   Take by mouth.         . tamsulosin (FLOMAX) 0.4 MG CAPS capsule   Oral   Take 0.4 mg by mouth daily.          Marland Kitchen warfarin (COUMADIN) 4 MG tablet   Oral   Take 12 mg by mouth daily at 6 PM.             Allergies Review of patient's allergies indicates no known allergies.   History reviewed. No pertinent family history.  Social History Social History  Substance Use Topics  . Smoking status: Former Smoker -- 62 years  . Smokeless tobacco: Current User    Types: Snuff  . Alcohol Use: No    Review of Systems  Constitutional:   No fever or chills. No weight changes Eyes:   No blurry vision or double vision.  ENT:   No sore throat.epistaxis as above Cardiovascular:   No chest pain. Respiratory:   No dyspnea or cough. Gastrointestinal:   Negative for abdominal pain, vomiting and diarrhea.  No BRBPR or melena. Genitourinary:   Negative for dysuria, urinary retention, bloody urine, or difficulty urinating. Musculoskeletal:   Negative for back pain. No joint  swelling or pain. Skin:   Negative for rash. Neurological:   Negative for headaches, focal weakness or numbness. Psychiatric:  No anxiety or depression.   Endocrine:  No hot/cold intolerance, changes in energy, or sleep difficulty.  10-point ROS otherwise negative.  ____________________________________________   PHYSICAL EXAM:  VITAL SIGNS: ED Triage Vitals  Enc Vitals Group     BP 12/16/14 0813 111/60 mmHg     Pulse --      Resp 12/16/14 0815 18     Temp 12/16/14 0813 98.6 F (37 C)     Temp src --      SpO2 12/16/14 0813 97 %     Weight 12/16/14 0813 241 lb (109.317 kg)     Height 12/16/14 0813 5\' 7"  (1.702 m)     Head Cir --      Peak Flow --      Pain Score 12/16/14 0814 0     Pain Loc --      Pain Edu? --      Excl. in [place]? --      Constitutional:   Alert and oriented. Well appearing and in no distress. Eyes:   No scleral icterus. No conjunctival pallor. PERRL. EOMI ENT   Head:   Normocephalic and atraumatic.   Nose:   Left nostril unremarkable. Right nostril is stuffed with gauze on arrival. I removed the gauze and a large amount of blood clot, and could see active slow bleeding from the anterior right nostril. Bleeding site is identified in the nostril. No septal lesion. No pulsatile bleeding.   Mouth/Throat:   MMM, no pharyngeal erythema. No peritonsillar mass. No uvula shift.there is old blood in the oropharynx. No active bleeding in the oropharynx.   Neck:   No stridor. No SubQ emphysema. No meningismus. Hematological/Lymphatic/Immunilogical:   No cervical lymphadenopathy. Cardiovascular:   RRR. Normal and symmetric distal pulses are present in all extremities. No murmurs, rubs, or gallops. Respiratory:   Normal respiratory effort without tachypnea nor retractions. Breath sounds are clear and equal bilaterally. No wheezes/rales/rhonchi. Gastrointestinal:   Soft and  nontender. No distention. There is no CVA tenderness.  No rebound, rigidity, or  guarding. Genitourinary:   deferred Musculoskeletal:   Nontender with normal range of motion in all extremities. No joint effusions.  No lower extremity tenderness.  No edema. Neurologic:   Normal speech and language.  CN 2-10 normal. Motor grossly intact. No pronator drift.  Normal gait. No gross focal neurologic deficits are appreciated.  Skin:    Skin is warm, dry and intact. No rash noted.  No petechiae, purpura, or bullae.no ecchymosis. Psychiatric:   Mood and affect are normal. Speech and behavior are normal. Patient exhibits appropriate insight and judgment.  ____________________________________________    LABS (pertinent positives/negatives) (all labs ordered are listed, but only abnormal results are displayed) Labs Reviewed  BASIC METABOLIC PANEL  CBC WITH DIFFERENTIAL/PLATELET  PROTIME-INR  APTT   ____________________________________________   EKG    ____________________________________________    RADIOLOGY    ____________________________________________   PROCEDURES Had patient blow nose clean and pinch for 10 minutes constantly.  9:50 AM, anterior nasal packing performed by me with gauze soaked in tranexamic acid. Patient instructed to leave the gauze in place and reassess.  10:30 AM, Rhino Rocket anterior balloon tamponade device placed in the right Nare by me. Inflated with 11 mL of air. Patient tolerated the insertion well. Tail was secured to the right cheek with silk tape. ____________________________________________   INITIAL IMPRESSION / ASSESSMENT AND PLAN / ED COURSE  Pertinent labs & imaging results that were available during my care of the patient were reviewed by me and considered in my medical decision making (see chart for details).  Patient presents with anterior epistaxis. We'll check labs due to cancer on chemotherapy.  ----------------------------------------- 8:46 AM on  12/16/2014 -----------------------------------------  Still bleeding on release of pressure. Will order tranexamic acid to apply topically.  ----------------------------------------- 10:11 AM on 12/16/2014 -----------------------------------------  Anterior packing is dislodged. Patient noted to be fidgety and unable to leave the gauze alone. Right nostril is persistently oozing blood. Due to persistent bleeding we will need to place a balloon tap and not device in the right nostril. There is no sterile water in the emergency department to prepare the device, so I'm waiting for sterile water to arrive from hospital supply chain.  ----------------------------------------- 11:01 AM on 12/16/2014 -----------------------------------------  Epistaxis has now resolved. No new blood in the oropharynx. No evidence of other significant bleeding. Patient was counseled to discontinue use of his Coumadin tonight and tomorrow, follow-up and INR clinic in 2 days on Thursday, and after that they will advise him on when to restart his Coumadin and how much to take. He buys them that if he is unable to follow up in Coumadin clinic he should return to the ED for an INR recheck. Once his INR is back in the therapeutic range he can follow-up with his primary care doctor or ENT for nasal balloon removal. I placed him on prophylactic amoxicillin while the device is in the nose. ____________________________________________   FINAL CLINICAL IMPRESSION(S) / ED DIAGNOSES  Final diagnoses:  Acute anterior epistaxis     Carrie Mew, MD 12/16/14 1102

## 2014-12-16 NOTE — Discharge Instructions (Signed)
You were seen in the ER today for a nosebleed. This is because your blood is abnormally thickened due to excess warfarin. Stop taking your warfarin tonight and tomorrow, and follow-up on Thursday in the Coumadin clinic to have your INR blood test checked. At that time they can advise you whether he should resume taking your warfarin. He should also follow-up with the ENT doctor in 3 days for removal of the packing device if your INR has returned to the expected range. Avoid any activities that may result in injury or trauma.  Warfarin Coagulopathy Warfarin (Coumadin) coagulopathy refers to bleeding that may occur as a complication of the medicine warfarin. Warfarin is an oral blood thinner (anticoagulant). Warfarin is used for medical conditions where thinning of the blood is needed to prevent blood clots.  CAUSES Bleeding is the most common and most serious complication of warfarin. The amount of bleeding is related to the warfarin dose and length of treatment. In addition, bleeding complications can also occur due to:  Intentional or accidental warfarin overdose.  Underlying medical conditions.  Dietary changes.  Medicine, herbal, supplement, or alcohol interactions. SYMPTOMS Severe bleeding while on warfarin may occur from any tissue or organ. Symptoms of the blood being too thin may include:  Bleeding from the nose or gums.  Blood in bowel movements which may appear as bright red, dark, or black tarry stools.  Blood in the urine which may appear as pink, red, or brown urine.  Unusual bruising or bruising easily.  A cut that does not stop bleeding within 10 minutes.  Vomiting blood or continuous nausea for more than 1 day.  Coughing up blood.  Broken blood vessels in your eye (subconjunctival hemorrhage).  Abdominal or back pain with or without flank bruising.  Sudden, severe headache.  Sudden weakness or numbness of the face, arm, or leg, especially on one side of the  body.  Sudden confusion.  Trouble speaking (aphasia) or understanding.  Sudden trouble seeing in one or both eyes.  Sudden trouble walking.  Dizziness.  Loss of balance or coordination.  Vaginal bleeding.  Swelling or pain at an injection site.  Superficial fat tissue death (necrosis) which may cause skin scarring. This is more common in women and may first present as pain in the waist, thighs, or buttocks.  Fever. HOME CARE INSTRUCTIONS  Always contact your health care provider of any concerns or signs of possible warfarin coagulopathy as soon as possible.  Take warfarin exactly as directed by your health care provider. It is recommended that you take your warfarin dose at the same time of the day. If you have been told to stop taking warfarin, do not resume taking warfarin until directed to do so by your health care provider. Follow your health care provider's instructions if you accidentally take an extra dose or miss a dose of warfarin. It is very important to take warfarin as directed since bleeding or blood clots could result in chronic or permanent injury, pain, or disability.  Keep all follow-up appointments with your health care provider as directed. It is very important to keep your appointments. Not keeping appointments could result in a chronic or permanent injury, pain, or disability because warfarin is a medicine that requires close monitoring.  While taking warfarin, you will need to have regular blood tests to measure your blood clotting time. These blood tests usually include both the prothrombin time (PT) and International Normalized Ratio (INR) tests. The PT and INR results allow your health  care provider to adjust your dose of warfarin. The dose can change for many reasons. It is critically important that you have your PT and INR levels drawn exactly as directed. Your warfarin dose may stay the same or change depending on what the PT and INR results are. Be sure to  follow up with your health care provider regarding your PT and INR test results and what your warfarin dosage should be.  Many medicines can interfere with warfarin and affect the PT and INR results. You must tell your health care provider about any and all medicines you take. This includes all vitamins and supplements. Ask your health care provider before taking these. Prescription and over-the-counter medicine consistency is critical to warfarin management. It is important that potential interactions are checked before you start a new medicine. Be especially cautious with aspirin and anti-inflammatory medicines. Ask your health care provider before taking these. Medicines such as antibiotics and acid-reducing medicine can interact with warfarin and can cause an increased warfarin effect. Warfarin can also interfere with the effectiveness of medicines you are taking. Do not take or discontinue any prescribed or over-the-counter medicine except on the advice of your health care provider or pharmacist.  Some vitamins, supplements, and herbal products interfere with the effectiveness of warfarin. Vitamin E may increase the anticoagulant effects of warfarin. Vitamin K can cause warfarin to be less effective. Do not take or discontinue any vitamin, supplement, or herbal product except on the advice of your health care provider or pharmacist.  Eat what you normally eat and keep the vitamin K content of your diet consistent. Avoid major changes in your diet, or notify your health care provider before changing your diet. Suddenly getting a lot more vitamin K could cause your blood to clot too quickly. A sudden decrease in vitamin K intake could cause your blood to clot too slowly. These changes in vitamin K intake could lead to dangerous blood clotsor to bleeding. To keep your vitamin K intake consistent, you must be aware of which foods contain moderate or high amounts of vitamin K. Some foods high in vitamin K  include spinach, kale, broccoli, cabbage, greens, Brussels sprouts, asparagus, bok choy, coleslaw, parsley, and green tea. Arrange a visit with a dietitian to answer your questions.  If you have a loss of appetite or get the stomach flu (viral gastroenteritis), talk to your health care provider as soon as possible. A decrease in your normal vitamin K intake can make you more sensitive to your usual dose of warfarin.  Some medical conditions may increase your risk for bleeding while you are taking warfarin. A fever, diarrhea lasting more than a day, worsening heart failure, or worsening liver function are some medical conditions that could affect warfarin. Contact your health care provider if you have any of these medical conditions.  Be careful not to cut yourself when using sharp objects or while shaving.  Alcohol can change the body's ability to handle warfarin. It is best to avoid alcoholic drinks or consume only very small amounts while taking warfarin. Notify your health care provider if you change your alcohol intake. A sudden increase in alcohol use can increase your risk of bleeding. Chronic alcohol use can cause warfarin to be less effective.  Limit physical activities or sports that could result in a fall or cause injury.  Do not use warfarin if you are pregnant.  Inform all your health care providers and your dentist that you take warfarin.  Inform all  health care providers if you are taking warfarin and aspirin or platelet inhibitor medicines such as clopidogrel, ticagrelor, or prasugrel. Use of these medicines in addition to warfarin can increase your risk of bleeding or death. Taking these medicines together should only be done under the direct care of your health care providers. SEEK IMMEDIATE MEDICAL CARE IF:  You cough up blood.  You have dark or black stools or there is bright red blood coming from your rectum.  You vomit blood or have nausea for more than 1 day.  You have  blood in the urine or pink-colored urine.  You have unusual bruising or have increased bruising.  You have bleeding from the nose or gums that does not stop quickly.  You have a cut that does not stop bleeding within 2-3 minutes.  You have sudden weakness or numbness of the face, arm, or leg, especially on one side of the body.  You have sudden confusion.  You have trouble speaking (aphasia) or understanding.  You have sudden trouble seeing in one or both eyes.  You have sudden trouble walking.  You have dizziness.  You have a loss of balance or coordination.  You have a sudden, severe headache.  You have a serious fall or head injury, even if you are not bleeding.  You have swelling or pain at an injection site.  You have unexplained tenderness or pain in the abdomen, back, waist, thighs, or buttocks.  You have a fever. Any of these symptoms may represent a serious problem that is an emergency. Do not wait to see if the symptoms will go away. Get medical help right away. Call your local emergency services (911 in [place] Do not drive yourself to the hospital. Document Released: 03/13/2006 Document Revised: 08/19/2013 Document Reviewed: 09/13/2011 [facility] Patient Information 2015 [place], [place]. This information is not intended to replace advice given to you by your health care provider. Make sure you discuss any questions you have with your health care provider.  Nosebleed Nosebleeds can be caused by many conditions, including trauma, infections, polyps, foreign bodies, dry mucous membranes or climate, medicines, and air conditioning. Most nosebleeds occur in the front of the nose. Because of this location, most nosebleeds can be controlled by pinching the nostrils gently and continuously for at least 10 to 20 minutes. The long, continuous pressure allows enough time for the blood to clot. If pressure is released during that 10 to 20 minute time period, the process may have to  be started again. The nosebleed may stop by itself or quit with pressure, or it may need concentrated heating (cautery) or pressure from packing. HOME CARE INSTRUCTIONS   If your nose was packed, try to maintain the pack inside until your health care provider removes it. If a gauze pack was used and it starts to fall out, gently replace it or cut the end off. Do not cut if a balloon catheter was used to pack the nose. Otherwise, do not remove unless instructed.  Avoid blowing your nose for 12 hours after treatment. This could dislodge the pack or clot and start the bleeding again.  If the bleeding starts again, sit up and bend forward, gently pinching the front half of your nose continuously for 20 minutes.  If bleeding was caused by dry mucous membranes, use over-the-counter saline nasal spray or gel. This will keep the mucous membranes moist and allow them to heal. If you must use a lubricant, choose the water-soluble variety. Use it only  sparingly and not within several hours of lying down.  Do not use petroleum jelly or mineral oil, as these may drip into the lungs and cause serious problems.  Maintain humidity in your home by using less air conditioning or by using a humidifier.  Do not use aspirin or medicines which make bleeding more likely. Your health care provider can give you recommendations on this.  Resume normal activities as you are able, but try to avoid straining, lifting, or bending at the waist for several days.  If the nosebleeds become recurrent and the cause is unknown, your health care provider may suggest laboratory tests. SEEK MEDICAL CARE IF: You have a fever. SEEK IMMEDIATE MEDICAL CARE IF:   Bleeding recurs and cannot be controlled.  There is unusual bleeding from or bruising on other parts of the body.  Nosebleeds continue.  There is any worsening of the condition which originally brought you in.  You become light-headed, feel faint, become sweaty, or  vomit blood. MAKE SURE YOU:   Understand these instructions.  Will watch your condition.  Will get help right away if you are not doing well or get worse. Document Released: 01/12/2005 Document Revised: 08/19/2013 Document Reviewed: 03/05/2009 [facility] Patient Information 2015 [place], [place]. This information is not intended to replace advice given to you by your health care provider. Make sure you discuss any questions you have with your health care provider.

## 2014-12-18 ENCOUNTER — Inpatient Hospital Stay: Payer: Medicare Other | Attending: Oncology

## 2014-12-18 ENCOUNTER — Telehealth: Payer: Self-pay | Admitting: *Deleted

## 2014-12-18 ENCOUNTER — Other Ambulatory Visit: Payer: Self-pay | Admitting: Oncology

## 2014-12-18 VITALS — BP 115/75 | HR 61 | Resp 20

## 2014-12-18 DIAGNOSIS — Z79899 Other long term (current) drug therapy: Secondary | ICD-10-CM | POA: Insufficient documentation

## 2014-12-18 DIAGNOSIS — I10 Essential (primary) hypertension: Secondary | ICD-10-CM | POA: Insufficient documentation

## 2014-12-18 DIAGNOSIS — R1310 Dysphagia, unspecified: Secondary | ICD-10-CM | POA: Diagnosis not present

## 2014-12-18 DIAGNOSIS — C159 Malignant neoplasm of esophagus, unspecified: Secondary | ICD-10-CM | POA: Diagnosis present

## 2014-12-18 DIAGNOSIS — I4891 Unspecified atrial fibrillation: Secondary | ICD-10-CM | POA: Diagnosis not present

## 2014-12-18 DIAGNOSIS — R634 Abnormal weight loss: Secondary | ICD-10-CM | POA: Diagnosis not present

## 2014-12-18 DIAGNOSIS — Z87891 Personal history of nicotine dependence: Secondary | ICD-10-CM | POA: Insufficient documentation

## 2014-12-18 DIAGNOSIS — C801 Malignant (primary) neoplasm, unspecified: Secondary | ICD-10-CM

## 2014-12-18 DIAGNOSIS — G4730 Sleep apnea, unspecified: Secondary | ICD-10-CM | POA: Insufficient documentation

## 2014-12-18 DIAGNOSIS — E860 Dehydration: Secondary | ICD-10-CM | POA: Diagnosis not present

## 2014-12-18 DIAGNOSIS — Z7901 Long term (current) use of anticoagulants: Secondary | ICD-10-CM | POA: Diagnosis not present

## 2014-12-18 DIAGNOSIS — D696 Thrombocytopenia, unspecified: Secondary | ICD-10-CM | POA: Insufficient documentation

## 2014-12-18 DIAGNOSIS — Z5111 Encounter for antineoplastic chemotherapy: Secondary | ICD-10-CM | POA: Insufficient documentation

## 2014-12-18 MED ORDER — SODIUM CHLORIDE 0.9 % IV SOLN
Freq: Once | INTRAVENOUS | Status: AC
  Administered 2014-12-18: 15:00:00 via INTRAVENOUS
  Filled 2014-12-18: qty 1000

## 2014-12-18 MED ORDER — SODIUM CHLORIDE 0.9 % IV SOLN
Freq: Once | INTRAVENOUS | Status: AC
  Administered 2014-12-18: 14:00:00 via INTRAVENOUS
  Filled 2014-12-18: qty 1000

## 2014-12-18 MED ORDER — SODIUM CHLORIDE 0.9 % IJ SOLN
10.0000 mL | Freq: Once | INTRAMUSCULAR | Status: AC
  Administered 2014-12-18: 10 mL via INTRAVENOUS
  Filled 2014-12-18: qty 10

## 2014-12-18 MED ORDER — SODIUM CHLORIDE 0.9 % IV SOLN
Freq: Once | INTRAVENOUS | Status: AC
  Administered 2015-08-31: 10:00:00 via INTRAVENOUS
  Filled 2014-12-18: qty 1000

## 2014-12-18 MED ORDER — HEPARIN SOD (PORK) LOCK FLUSH 100 UNIT/ML IV SOLN
500.0000 [IU] | Freq: Once | INTRAVENOUS | Status: AC
  Administered 2014-12-18: 500 [IU] via INTRAVENOUS
  Filled 2014-12-18: qty 5

## 2014-12-18 NOTE — Telephone Encounter (Signed)
Patient can come in now for IVF Jeremy Gordon is on her way to get him and bring him in before noon

## 2014-12-18 NOTE — Telephone Encounter (Signed)
Reports she thinks he needs IVF, Had a nose bleed the other night requiring packing and has fu with Dr Kathyrn Sheriff to have it removed, has fu with Nephrology tomorrow and will have PT checked then. He is feeling very weak

## 2014-12-19 ENCOUNTER — Other Ambulatory Visit
Admission: RE | Admit: 2014-12-19 | Discharge: 2014-12-19 | Disposition: A | Discharge status: Home / Self Care | Payer: Medicare Other | Source: Ambulatory Visit | Attending: Otolaryngology | Admitting: Otolaryngology

## 2014-12-19 ENCOUNTER — Other Ambulatory Visit: Admission: RE | Admit: 2014-12-19 | Payer: Self-pay | Source: Ambulatory Visit | Admitting: Otolaryngology

## 2014-12-19 DIAGNOSIS — R040 Epistaxis: Secondary | ICD-10-CM | POA: Diagnosis present

## 2014-12-19 LAB — CBC WITH DIFFERENTIAL/PLATELET
Basophils Absolute: 0 10*3/uL (ref 0–0.1)
Basophils Relative: 0 %
EOS ABS: 0 10*3/uL (ref 0–0.7)
EOS PCT: 0 %
HCT: 39.7 % — ABNORMAL LOW (ref 40.0–52.0)
Hemoglobin: 13.6 g/dL (ref 13.0–18.0)
LYMPHS ABS: 1.1 10*3/uL (ref 1.0–3.6)
Lymphocytes Relative: 27 %
MCH: 29.6 pg (ref 26.0–34.0)
MCHC: 34.2 g/dL (ref 32.0–36.0)
MCV: 86.6 fL (ref 80.0–100.0)
MONOS PCT: 12 %
Monocytes Absolute: 0.5 10*3/uL (ref 0.2–1.0)
Neutro Abs: 2.4 10*3/uL (ref 1.4–6.5)
Neutrophils Relative %: 61 %
PLATELETS: 143 10*3/uL — AB (ref 150–440)
RBC: 4.58 MIL/uL (ref 4.40–5.90)
RDW: 15.8 % — ABNORMAL HIGH (ref 11.5–14.5)
WBC: 4 10*3/uL (ref 3.8–10.6)

## 2014-12-19 LAB — PROTIME-INR
INR: 2.03
Prothrombin Time: 22.8 seconds — ABNORMAL HIGH (ref 11.4–15.0)

## 2014-12-23 ENCOUNTER — Inpatient Hospital Stay: Payer: Medicare Other

## 2014-12-23 ENCOUNTER — Inpatient Hospital Stay (HOSPITAL_BASED_OUTPATIENT_CLINIC_OR_DEPARTMENT_OTHER): Payer: Medicare Other | Admitting: Oncology

## 2014-12-23 ENCOUNTER — Other Ambulatory Visit: Payer: Self-pay

## 2014-12-23 ENCOUNTER — Encounter: Payer: Self-pay | Admitting: Oncology

## 2014-12-23 VITALS — BP 138/84 | HR 85 | Temp 97.1°F | Wt 228.7 lb

## 2014-12-23 DIAGNOSIS — G4730 Sleep apnea, unspecified: Secondary | ICD-10-CM

## 2014-12-23 DIAGNOSIS — Z79899 Other long term (current) drug therapy: Secondary | ICD-10-CM

## 2014-12-23 DIAGNOSIS — I4891 Unspecified atrial fibrillation: Secondary | ICD-10-CM | POA: Diagnosis not present

## 2014-12-23 DIAGNOSIS — R634 Abnormal weight loss: Secondary | ICD-10-CM

## 2014-12-23 DIAGNOSIS — Z7901 Long term (current) use of anticoagulants: Secondary | ICD-10-CM

## 2014-12-23 DIAGNOSIS — F17210 Nicotine dependence, cigarettes, uncomplicated: Secondary | ICD-10-CM

## 2014-12-23 DIAGNOSIS — R1310 Dysphagia, unspecified: Secondary | ICD-10-CM | POA: Diagnosis not present

## 2014-12-23 DIAGNOSIS — I10 Essential (primary) hypertension: Secondary | ICD-10-CM

## 2014-12-23 DIAGNOSIS — C801 Malignant (primary) neoplasm, unspecified: Secondary | ICD-10-CM

## 2014-12-23 DIAGNOSIS — C159 Malignant neoplasm of esophagus, unspecified: Secondary | ICD-10-CM

## 2014-12-23 DIAGNOSIS — E860 Dehydration: Secondary | ICD-10-CM

## 2014-12-23 LAB — CBC WITH DIFFERENTIAL/PLATELET
BASOS ABS: 0 10*3/uL (ref 0–0.1)
Eosinophils Absolute: 0 10*3/uL (ref 0–0.7)
Eosinophils Relative: 2 %
HEMATOCRIT: 34 % — AB (ref 40.0–52.0)
HEMOGLOBIN: 11.8 g/dL — AB (ref 13.0–18.0)
Lymphs Abs: 0.7 10*3/uL — ABNORMAL LOW (ref 1.0–3.6)
MCH: 29.5 pg (ref 26.0–34.0)
MCHC: 34.6 g/dL (ref 32.0–36.0)
MCV: 85.3 fL (ref 80.0–100.0)
Monocytes Absolute: 0.5 10*3/uL (ref 0.2–1.0)
Monocytes Relative: 38 %
NEUTROS ABS: 0.1 10*3/uL — AB (ref 1.4–6.5)
Neutrophils Relative %: 9 %
Platelets: 161 10*3/uL (ref 150–440)
RBC: 3.99 MIL/uL — AB (ref 4.40–5.90)
RDW: 16 % — ABNORMAL HIGH (ref 11.5–14.5)
WBC: 1.3 10*3/uL — CL (ref 3.8–10.6)

## 2014-12-23 LAB — BASIC METABOLIC PANEL
ANION GAP: 6 (ref 5–15)
BUN: 13 mg/dL (ref 6–20)
CALCIUM: 8.1 mg/dL — AB (ref 8.9–10.3)
CO2: 28 mmol/L (ref 22–32)
Chloride: 99 mmol/L — ABNORMAL LOW (ref 101–111)
Creatinine, Ser: 0.89 mg/dL (ref 0.61–1.24)
GLUCOSE: 111 mg/dL — AB (ref 65–99)
POTASSIUM: 3.2 mmol/L — AB (ref 3.5–5.1)
Sodium: 133 mmol/L — ABNORMAL LOW (ref 135–145)

## 2014-12-23 MED ORDER — SODIUM CHLORIDE 0.9 % IJ SOLN
10.0000 mL | INTRAMUSCULAR | Status: AC | PRN
  Administered 2014-12-23: 10 mL via INTRAVENOUS
  Filled 2014-12-23: qty 10

## 2014-12-23 MED ORDER — HEPARIN SOD (PORK) LOCK FLUSH 100 UNIT/ML IV SOLN
500.0000 [IU] | Freq: Once | INTRAVENOUS | Status: AC
  Administered 2014-12-23: 500 [IU] via INTRAVENOUS
  Filled 2014-12-23: qty 5

## 2014-12-23 MED ORDER — SODIUM CHLORIDE 0.9 % IV SOLN
Freq: Once | INTRAVENOUS | Status: AC
  Administered 2014-12-23: 11:00:00 via INTRAVENOUS
  Filled 2014-12-23: qty 1000

## 2014-12-23 NOTE — Progress Notes (Signed)
[place]  [phone] [phone]) [phone]  ID: Jeremy Gordon OB: [birth year]  MR#: [id number]  ERD#:[id number]  Patient Care Team: No Pcp Per Patient as PCP - General (General Practice) No Pcp Per Patient (General Practice)  CHIEF COMPLAINT:  Chief Complaint  Patient presents with  . Follow-up  . Esophageal Cancer    INTERVAL HISTORY: Patient returns to clinic today for further evaluation and consideration of cycle 3 of FOLFIRI.  He continues to have difficulty swallowing and has lost weight in the interim. He had a nosebleed which required packing by ENT last week, but this has resolved. He otherwise feels well. He has no neurologic complaints. He denies any recent fevers. He denies any chest pain, shortness of breath, or cough. He denies any nausea, vomiting, constipation, or diarrhea. He has no melena or hematochezia. He has no urinary complaints. Patient offers no further specific complaints today.  REVIEW OF SYSTEMS:   Review of Systems  Constitutional: Positive for weight loss. Negative for fever.  Respiratory: Negative.   Cardiovascular: Negative.   Gastrointestinal: Negative.     As per HPI. Otherwise, a complete review of systems is negatve.  PAST MEDICAL HISTORY:  Hypertension, sleep apnea, atrial fibrillation.  PAST SURGICAL HISTORY: Negative.  FAMILY HISTORY: Reviewed and unchanged. No report of malignancy or chronic disease.     ADVANCED DIRECTIVES:    HEALTH MAINTENANCE: Social History  Substance Use Topics  . Smoking status: Former Smoker -- 45 years  . Smokeless tobacco: Current User    Types: Snuff  . Alcohol Use: No     Colonoscopy:  PAP:  Bone density:  Lipid panel:  No Known Allergies  Current Outpatient Prescriptions  Medication Sig Dispense Refill  . albuterol (PROAIR HFA) 108 (90 BASE) MCG/ACT inhaler Inhale 2 puffs into the lungs every 4 (four) hours as needed for wheezing.     . diltiazem (DILACOR XR)  120 MG 24 hr capsule Take 120 mg by mouth daily.     . enalapril (VASOTEC) 20 MG tablet Take 20 mg by mouth 2 (two) times daily.     . finasteride (PROSCAR) 5 MG tablet Take 5 mg by mouth daily.     . fluticasone (FLONASE) 50 MCG/ACT nasal spray 1 spray by Each Nare route nightly. Frequency:QD   Dosage:50   MCG  Instructions:  Note:Dose: 36 MCG    . furosemide (LASIX) 20 MG tablet Take 20 mg by mouth daily.     Marland Kitchen gabapentin (NEURONTIN) 300 MG capsule Take 300 mg by mouth 2 (two) times daily.     Marland Kitchen HYDROcodone-acetaminophen (LORTAB) 10-500 MG per tablet Take by mouth every 8 (eight) hours as needed for pain.     . metoprolol (LOPRESSOR) 50 MG tablet Take 50 mg by mouth 2 (two) times daily.     Marland Kitchen nystatin (MYCOSTATIN) 100000 UNIT/ML suspension     . omeprazole (PRILOSEC) 20 MG capsule TAKE ONE (1) CAPSULE BY MOUTH 2 TIMES DAILY    . prochlorperazine (COMPAZINE) 10 MG tablet Take 10 mg by mouth every 6 (six) hours as needed for nausea or vomiting.    . tamsulosin (FLOMAX) 0.4 MG CAPS capsule Take 0.4 mg by mouth daily.     Marland Kitchen warfarin (COUMADIN) 4 MG tablet Take 12 mg by mouth daily at 6 PM.     . amoxicillin (AMOXIL) 500 MG tablet Take 1 tablet (500 mg total) by mouth 2 (two) times daily. Stop taking this medication once the packing device  has been removed from your nose. (Patient not taking: Reported on 12/23/2014) 20 tablet 0  . sucralfate (CARAFATE) 1 GM/10ML suspension Take by mouth.     No current facility-administered medications for this visit.   Facility-Administered Medications Ordered in Other Visits  Medication Dose Route Frequency Provider Last Rate Last Dose  . 0.9 %  sodium chloride infusion   Intravenous Once Lloyd Huger, MD      . sodium chloride 0.9 % injection 10 mL  10 mL Intravenous PRN Lloyd Huger, MD   10 mL at 12/23/14 0945    OBJECTIVE: Filed Vitals:   12/23/14 1006  BP: 138/84  Pulse: 85  Temp: 97.1 F (36.2 C)     Body mass index is 35.82 kg/(m^2).     ECOG FS:0 - Asymptomatic  General: Well-developed, well-nourished, no acute distress. Eyes: anicteric sclera. Lungs: Clear to auscultation bilaterally. Heart: Regular rate and rhythm. No rubs, murmurs, or gallops. Abdomen: Soft, nontender, nondistended. No organomegaly noted, normoactive bowel sounds. Musculoskeletal: No edema, cyanosis, or clubbing. Neuro: Alert, answering all questions appropriately. Cranial nerves grossly intact. Skin: No rashes or petechiae noted. Psych: Normal affect.   LAB RESULTS:  Lab Results  Component Value Date   NA 133* 12/23/2014   K 3.2* 12/23/2014   CL 99* 12/23/2014   CO2 28 12/23/2014   GLUCOSE 111* 12/23/2014   BUN 13 12/23/2014   CREATININE 0.89 12/23/2014   CALCIUM 8.1* 12/23/2014   PROT 7.0 12/08/2014   ALBUMIN 4.2 12/08/2014   AST 31 12/08/2014   ALT 25 12/08/2014   ALKPHOS 88 12/08/2014   BILITOT 0.9 12/08/2014   GFRNONAA >60 12/23/2014   GFRAA >60 12/23/2014    Lab Results  Component Value Date   WBC 1.3* 12/23/2014   NEUTROABS 0.1* 12/23/2014   HGB 11.8* 12/23/2014   HCT 34.0* 12/23/2014   MCV 85.3 12/23/2014   PLT 161 12/23/2014     STUDIES: No results found.  ASSESSMENT: Recurrent adenocarcinoma of the esophagus, HER-2 negative.  PLAN:    1. Esophageal cancer: CT reviewed independently. Patient also had recent evaluation at [facility] documenting his persistent or recurrent disease. After lengthy discussion, patient wishes to pursue palliative chemotherapy. Delay cycle 3 of FOLFIRI today secondary to neutropenia. Plan to give treatment every 2 weeks with reimaging after approximately 6 cycles. PEG tube is also discussed to help improve his nutrition, but patient continues to refuse this procedure at this time.  Return in 1 week for reconsideration of cycle 3. Will add Neulasta for the remainder of cycles.  2. Atrial fibrillation: Continue Coumadin as prescribed. INRs are monitored by his primary care physician. 3.  Thrombocytopenia: Resolved. Secondary to chemotherapy. Monitor. 4. Difficulty swallowing: Secondary to persistent malignancy. Patient has refused PEG tube placement. 5. Dehydration: Proceed with IV fluids today. 6. Nosebleed: Patient by report had supratherapeutic Coumadin. Packing per ENT.   Patient expressed understanding and was in agreement with this plan. He also understands that He can call clinic at any time with any questions, concerns, or complaints.    Lloyd Huger, MD   12/23/2014 2:30 PM

## 2014-12-24 ENCOUNTER — Telehealth: Payer: Self-pay | Admitting: *Deleted

## 2014-12-24 NOTE — Telephone Encounter (Signed)
Called stating pt is not feeling well, did not get chemo yesterday WBC too low. Can't identify exact what is wrong states "just feel bad" He had drenching night sweats last night, He is currently on abx form where he had his nose cauterized. He is not having chills or fever that she is aware of. Stating he wants to go see MD Dr Joella Prince at [facility]. Spoke with Dr Grayland Ormond who said that would be fine if he wants to see PMD. He stated he could give pt abx, but pt is already in abx so she will take him to [facility]

## 2014-12-29 ENCOUNTER — Other Ambulatory Visit: Payer: Self-pay | Admitting: Oncology

## 2014-12-30 ENCOUNTER — Inpatient Hospital Stay (HOSPITAL_BASED_OUTPATIENT_CLINIC_OR_DEPARTMENT_OTHER): Payer: Medicare Other | Admitting: Oncology

## 2014-12-30 ENCOUNTER — Inpatient Hospital Stay: Payer: Medicare Other

## 2014-12-30 VITALS — BP 133/87 | HR 73 | Temp 96.1°F | Resp 20 | Wt 229.0 lb

## 2014-12-30 DIAGNOSIS — E860 Dehydration: Secondary | ICD-10-CM | POA: Diagnosis not present

## 2014-12-30 DIAGNOSIS — C159 Malignant neoplasm of esophagus, unspecified: Secondary | ICD-10-CM

## 2014-12-30 DIAGNOSIS — I10 Essential (primary) hypertension: Secondary | ICD-10-CM

## 2014-12-30 DIAGNOSIS — F17210 Nicotine dependence, cigarettes, uncomplicated: Secondary | ICD-10-CM

## 2014-12-30 DIAGNOSIS — G4730 Sleep apnea, unspecified: Secondary | ICD-10-CM

## 2014-12-30 DIAGNOSIS — Z7901 Long term (current) use of anticoagulants: Secondary | ICD-10-CM

## 2014-12-30 DIAGNOSIS — Z79899 Other long term (current) drug therapy: Secondary | ICD-10-CM

## 2014-12-30 DIAGNOSIS — I4891 Unspecified atrial fibrillation: Secondary | ICD-10-CM

## 2014-12-30 DIAGNOSIS — R634 Abnormal weight loss: Secondary | ICD-10-CM

## 2014-12-30 DIAGNOSIS — R1310 Dysphagia, unspecified: Secondary | ICD-10-CM

## 2014-12-30 LAB — CBC WITH DIFFERENTIAL/PLATELET
Basophils Absolute: 0 10*3/uL (ref 0–0.1)
Basophils Relative: 1 %
EOS ABS: 0.1 10*3/uL (ref 0–0.7)
EOS PCT: 2 %
HCT: 33.6 % — ABNORMAL LOW (ref 40.0–52.0)
Hemoglobin: 11.6 g/dL — ABNORMAL LOW (ref 13.0–18.0)
LYMPHS ABS: 1 10*3/uL (ref 1.0–3.6)
Lymphocytes Relative: 21 %
MCH: 29.7 pg (ref 26.0–34.0)
MCHC: 34.4 g/dL (ref 32.0–36.0)
MCV: 86.3 fL (ref 80.0–100.0)
MONO ABS: 0.6 10*3/uL (ref 0.2–1.0)
MONOS PCT: 12 %
Neutro Abs: 3 10*3/uL (ref 1.4–6.5)
Neutrophils Relative %: 64 %
PLATELETS: 195 10*3/uL (ref 150–440)
RBC: 3.89 MIL/uL — AB (ref 4.40–5.90)
RDW: 16.3 % — AB (ref 11.5–14.5)
WBC: 4.8 10*3/uL (ref 3.8–10.6)

## 2014-12-30 LAB — COMPREHENSIVE METABOLIC PANEL
ALT: 18 U/L (ref 17–63)
ANION GAP: 6 (ref 5–15)
AST: 23 U/L (ref 15–41)
Albumin: 3.2 g/dL — ABNORMAL LOW (ref 3.5–5.0)
Alkaline Phosphatase: 105 U/L (ref 38–126)
BUN: 11 mg/dL (ref 6–20)
CHLORIDE: 98 mmol/L — AB (ref 101–111)
CO2: 31 mmol/L (ref 22–32)
Calcium: 8.1 mg/dL — ABNORMAL LOW (ref 8.9–10.3)
Creatinine, Ser: 0.83 mg/dL (ref 0.61–1.24)
Glucose, Bld: 108 mg/dL — ABNORMAL HIGH (ref 65–99)
POTASSIUM: 3.3 mmol/L — AB (ref 3.5–5.1)
SODIUM: 135 mmol/L (ref 135–145)
Total Bilirubin: 0.7 mg/dL (ref 0.3–1.2)
Total Protein: 6 g/dL — ABNORMAL LOW (ref 6.5–8.1)

## 2014-12-30 MED ORDER — IRINOTECAN HCL CHEMO INJECTION 100 MG/5ML
400.0000 mg | Freq: Once | INTRAVENOUS | Status: AC
  Administered 2014-12-30: 400 mg via INTRAVENOUS
  Filled 2014-12-30: qty 6.67

## 2014-12-30 MED ORDER — HEPARIN SOD (PORK) LOCK FLUSH 100 UNIT/ML IV SOLN
500.0000 [IU] | Freq: Once | INTRAVENOUS | Status: AC | PRN
  Filled 2014-12-30: qty 5

## 2014-12-30 MED ORDER — SODIUM CHLORIDE 0.9 % IV SOLN
2400.0000 mg/m2 | INTRAVENOUS | Status: AC
  Administered 2014-12-30: 5400 mg via INTRAVENOUS
  Filled 2014-12-30: qty 108

## 2014-12-30 MED ORDER — FLUOROURACIL CHEMO INJECTION 2.5 GM/50ML
400.0000 mg/m2 | Freq: Once | INTRAVENOUS | Status: AC
  Administered 2014-12-30: 900 mg via INTRAVENOUS
  Filled 2014-12-30: qty 18

## 2014-12-30 MED ORDER — SODIUM CHLORIDE 0.9 % IV SOLN
Freq: Once | INTRAVENOUS | Status: AC
  Administered 2014-12-30: 11:00:00 via INTRAVENOUS
  Filled 2014-12-30: qty 1000

## 2014-12-30 MED ORDER — LEUCOVORIN CALCIUM INJECTION 350 MG
400.0000 mg/m2 | Freq: Once | INTRAVENOUS | Status: AC
  Administered 2014-12-30: 900 mg via INTRAVENOUS
  Filled 2014-12-30: qty 45

## 2014-12-30 MED ORDER — ATROPINE SULFATE 0.4 MG/ML IJ SOLN
0.4000 mg | Freq: Once | INTRAMUSCULAR | Status: AC | PRN
  Administered 2014-12-30: 0.4 mg via INTRAVENOUS
  Filled 2014-12-30: qty 1

## 2014-12-30 MED ORDER — SODIUM CHLORIDE 0.9 % IJ SOLN
10.0000 mL | INTRAMUSCULAR | Status: DC | PRN
  Administered 2014-12-30: 10 mL
  Filled 2014-12-30: qty 10

## 2014-12-30 MED ORDER — SODIUM CHLORIDE 0.9 % IV SOLN
Freq: Once | INTRAVENOUS | Status: AC
  Administered 2014-12-30: 12:00:00 via INTRAVENOUS
  Filled 2014-12-30: qty 8

## 2015-01-01 ENCOUNTER — Inpatient Hospital Stay: Payer: Medicare Other

## 2015-01-01 VITALS — BP 116/66 | HR 76 | Temp 96.9°F | Resp 18

## 2015-01-01 DIAGNOSIS — C159 Malignant neoplasm of esophagus, unspecified: Secondary | ICD-10-CM | POA: Diagnosis not present

## 2015-01-01 MED ORDER — SODIUM CHLORIDE 0.9 % IJ SOLN
10.0000 mL | INTRAMUSCULAR | Status: DC | PRN
  Administered 2015-01-01: 10 mL
  Filled 2015-01-01: qty 10

## 2015-01-01 MED ORDER — PEGFILGRASTIM INJECTION 6 MG/0.6ML ~~LOC~~
6.0000 mg | PREFILLED_SYRINGE | Freq: Once | SUBCUTANEOUS | Status: AC
  Administered 2015-01-01: 6 mg via SUBCUTANEOUS
  Filled 2015-01-01: qty 0.6

## 2015-01-01 MED ORDER — HEPARIN SOD (PORK) LOCK FLUSH 100 UNIT/ML IV SOLN
500.0000 [IU] | Freq: Once | INTRAVENOUS | Status: AC | PRN
  Administered 2015-01-01: 500 [IU]
  Filled 2015-01-01: qty 5

## 2015-01-07 NOTE — Progress Notes (Signed)
[place]  [phone]) [phone] [phone]) [phone]  ID: Jeremy Gordon OB: [birth year]  MR#: [id number]  IOM#:[id number]  Patient Care Team: No Pcp Per Patient as PCP - General (General Practice) No Pcp Per Patient (General Practice)  CHIEF COMPLAINT:  Chief Complaint  Patient presents with  . Esophageal Cancer  . Chemotherapy    INTERVAL HISTORY: Patient returns to clinic today for further evaluation and reconsideration of cycle 3 of FOLFIRI.  He continues to have difficulty swallowing, but states this is improving. He does not report any further nosebleeds. He otherwise feels well. He has no neurologic complaints. He denies any recent fevers. He denies any chest pain, shortness of breath, or cough. He denies any nausea, vomiting, constipation, or diarrhea. He has no melena or hematochezia. He has no urinary complaints. Patient offers no further specific complaints today.  REVIEW OF SYSTEMS:   Review of Systems  Constitutional: Positive for weight loss. Negative for fever.  Respiratory: Negative.   Cardiovascular: Negative.   Gastrointestinal: Negative.     As per HPI. Otherwise, a complete review of systems is negatve.  PAST MEDICAL HISTORY:  Hypertension, sleep apnea, atrial fibrillation.  PAST SURGICAL HISTORY: Negative.  FAMILY HISTORY: Reviewed and unchanged. No report of malignancy or chronic disease.     ADVANCED DIRECTIVES:    HEALTH MAINTENANCE: Social History  Substance Use Topics  . Smoking status: Former Smoker -- 70 years  . Smokeless tobacco: Current User    Types: Snuff  . Alcohol Use: No     Colonoscopy:  PAP:  Bone density:  Lipid panel:  No Known Allergies  Current Outpatient Prescriptions  Medication Sig Dispense Refill  . albuterol (PROAIR HFA) 108 (90 BASE) MCG/ACT inhaler Inhale 2 puffs into the lungs every 4 (four) hours as needed for wheezing.     Marland Kitchen amoxicillin (AMOXIL) 500 MG tablet Take 1 tablet (500 mg  total) by mouth 2 (two) times daily. Stop taking this medication once the packing device has been removed from your nose. 20 tablet 0  . diltiazem (DILACOR XR) 120 MG 24 hr capsule Take 120 mg by mouth daily.     . enalapril (VASOTEC) 20 MG tablet Take 20 mg by mouth 2 (two) times daily.     . finasteride (PROSCAR) 5 MG tablet Take 5 mg by mouth daily.     . fluticasone (FLONASE) 50 MCG/ACT nasal spray 1 spray by Each Nare route nightly. Frequency:QD   Dosage:50   MCG  Instructions:  Note:Dose: 36 MCG    . furosemide (LASIX) 20 MG tablet Take 20 mg by mouth daily.     Marland Kitchen gabapentin (NEURONTIN) 300 MG capsule Take 300 mg by mouth 2 (two) times daily.     Marland Kitchen HYDROcodone-acetaminophen (LORTAB) 10-500 MG per tablet Take by mouth every 8 (eight) hours as needed for pain.     . metoprolol (LOPRESSOR) 50 MG tablet Take 50 mg by mouth 2 (two) times daily.     Marland Kitchen nystatin (MYCOSTATIN) 100000 UNIT/ML suspension     . omeprazole (PRILOSEC) 20 MG capsule TAKE ONE (1) CAPSULE BY MOUTH 2 TIMES DAILY    . prochlorperazine (COMPAZINE) 10 MG tablet Take 10 mg by mouth every 6 (six) hours as needed for nausea or vomiting.    . sucralfate (CARAFATE) 1 GM/10ML suspension Take by mouth.    . tamsulosin (FLOMAX) 0.4 MG CAPS capsule Take 0.4 mg by mouth daily.     Marland Kitchen warfarin (COUMADIN) 4 MG tablet Take  12 mg by mouth daily at 6 PM.      No current facility-administered medications for this visit.   Facility-Administered Medications Ordered in Other Visits  Medication Dose Route Frequency Provider Last Rate Last Dose  . 0.9 %  sodium chloride infusion   Intravenous Once Lloyd Huger, MD      . sodium chloride 0.9 % injection 10 mL  10 mL Intravenous PRN Lloyd Huger, MD   10 mL at 12/23/14 0945  . sodium chloride 0.9 % injection 10 mL  10 mL Intracatheter PRN Lloyd Huger, MD   10 mL at 12/30/14 0947    OBJECTIVE: Filed Vitals:   12/30/14 1056  BP: 133/87  Pulse: 73  Temp: 96.1 F (35.6 C)    Resp: 20     Body mass index is 35.86 kg/(m^2).    ECOG FS:0 - Asymptomatic  General: Well-developed, well-nourished, no acute distress. Eyes: anicteric sclera. Lungs: Clear to auscultation bilaterally. Heart: Regular rate and rhythm. No rubs, murmurs, or gallops. Abdomen: Soft, nontender, nondistended. No organomegaly noted, normoactive bowel sounds. Musculoskeletal: No edema, cyanosis, or clubbing. Neuro: Alert, answering all questions appropriately. Cranial nerves grossly intact. Skin: No rashes or petechiae noted. Psych: Normal affect.   LAB RESULTS:  Lab Results  Component Value Date   NA 135 12/30/2014   K 3.3* 12/30/2014   CL 98* 12/30/2014   CO2 31 12/30/2014   GLUCOSE 108* 12/30/2014   BUN 11 12/30/2014   CREATININE 0.83 12/30/2014   CALCIUM 8.1* 12/30/2014   PROT 6.0* 12/30/2014   ALBUMIN 3.2* 12/30/2014   AST 23 12/30/2014   ALT 18 12/30/2014   ALKPHOS 105 12/30/2014   BILITOT 0.7 12/30/2014   GFRNONAA >60 12/30/2014   GFRAA >60 12/30/2014    Lab Results  Component Value Date   WBC 4.8 12/30/2014   NEUTROABS 3.0 12/30/2014   HGB 11.6* 12/30/2014   HCT 33.6* 12/30/2014   MCV 86.3 12/30/2014   PLT 195 12/30/2014     STUDIES: No results found.  ASSESSMENT: Recurrent adenocarcinoma of the esophagus, HER-2 negative.  PLAN:    1. Esophageal cancer: CT reviewed independently. Patient also had recent evaluation at [facility] documenting his persistent or recurrent disease.  Proceed cycle 3 of FOLFIRI today secondary today. Plan to give treatment every 2 weeks with reimaging after approximately 6 cycles. PEG tube has also been discussed to help improve his nutrition, but patient continues to refuse this procedure at this time.  Return to clinic in 2 days for pump removal and Neulasta and then in 2 weeks for consideration of cycle 4.  2. Atrial fibrillation: Continue Coumadin as prescribed. INRs are monitored by his primary care physician. 3. Thrombocytopenia:  Resolved. Secondary to chemotherapy. Monitor. 4. Difficulty swallowing: Secondary to persistent malignancy. Patient has refused PEG tube placement. 5. Nosebleed: Resolved.   Patient expressed understanding and was in agreement with this plan. He also understands that He can call clinic at any time with any questions, concerns, or complaints.    Lloyd Huger, MD   01/07/2015 1:33 PM

## 2015-01-13 ENCOUNTER — Inpatient Hospital Stay: Payer: Medicare Other

## 2015-01-13 ENCOUNTER — Inpatient Hospital Stay (HOSPITAL_BASED_OUTPATIENT_CLINIC_OR_DEPARTMENT_OTHER): Payer: Medicare Other | Admitting: Oncology

## 2015-01-13 VITALS — BP 122/68 | Temp 95.7°F | Resp 20 | Wt 226.4 lb

## 2015-01-13 DIAGNOSIS — C159 Malignant neoplasm of esophagus, unspecified: Secondary | ICD-10-CM | POA: Diagnosis not present

## 2015-01-13 DIAGNOSIS — D696 Thrombocytopenia, unspecified: Secondary | ICD-10-CM

## 2015-01-13 DIAGNOSIS — R1310 Dysphagia, unspecified: Secondary | ICD-10-CM | POA: Diagnosis not present

## 2015-01-13 DIAGNOSIS — G4730 Sleep apnea, unspecified: Secondary | ICD-10-CM

## 2015-01-13 DIAGNOSIS — E860 Dehydration: Secondary | ICD-10-CM

## 2015-01-13 DIAGNOSIS — I10 Essential (primary) hypertension: Secondary | ICD-10-CM

## 2015-01-13 DIAGNOSIS — Z7901 Long term (current) use of anticoagulants: Secondary | ICD-10-CM

## 2015-01-13 DIAGNOSIS — Z79899 Other long term (current) drug therapy: Secondary | ICD-10-CM

## 2015-01-13 DIAGNOSIS — I4891 Unspecified atrial fibrillation: Secondary | ICD-10-CM

## 2015-01-13 DIAGNOSIS — Z87891 Personal history of nicotine dependence: Secondary | ICD-10-CM

## 2015-01-13 DIAGNOSIS — R634 Abnormal weight loss: Secondary | ICD-10-CM

## 2015-01-13 LAB — COMPREHENSIVE METABOLIC PANEL
ALBUMIN: 3.3 g/dL — AB (ref 3.5–5.0)
ALK PHOS: 104 U/L (ref 38–126)
ALT: 14 U/L — ABNORMAL LOW (ref 17–63)
ANION GAP: 5 (ref 5–15)
AST: 21 U/L (ref 15–41)
BUN: 11 mg/dL (ref 6–20)
CALCIUM: 8.3 mg/dL — AB (ref 8.9–10.3)
CO2: 30 mmol/L (ref 22–32)
Chloride: 105 mmol/L (ref 101–111)
Creatinine, Ser: 0.97 mg/dL (ref 0.61–1.24)
GFR calc Af Amer: >60 mL/min (ref >60)
GFR calc non Af Amer: >60 mL/min (ref >60)
GLUCOSE: 100 mg/dL — AB (ref 65–99)
Potassium: 3.7 mmol/L (ref 3.5–5.1)
SODIUM: 140 mmol/L (ref 135–145)
Total Bilirubin: 0.6 mg/dL (ref 0.3–1.2)
Total Protein: 5.9 g/dL — ABNORMAL LOW (ref 6.5–8.1)

## 2015-01-13 LAB — CBC WITH DIFFERENTIAL/PLATELET
BASOS ABS: 0 10*3/uL (ref 0–0.1)
BASOS PCT: 1 %
EOS ABS: 0.1 10*3/uL (ref 0–0.7)
Eosinophils Relative: 2 %
HCT: 34.4 % — ABNORMAL LOW (ref 40.0–52.0)
HEMOGLOBIN: 11.8 g/dL — AB (ref 13.0–18.0)
Lymphocytes Relative: 16 %
Lymphs Abs: 0.9 10*3/uL — ABNORMAL LOW (ref 1.0–3.6)
MCH: 30.4 pg (ref 26.0–34.0)
MCHC: 34.2 g/dL (ref 32.0–36.0)
MCV: 89 fL (ref 80.0–100.0)
MONOS PCT: 11 %
Monocytes Absolute: 0.6 10*3/uL (ref 0.2–1.0)
NEUTROS ABS: 4 10*3/uL (ref 1.4–6.5)
NEUTROS PCT: 70 %
Platelets: 138 10*3/uL — ABNORMAL LOW (ref 150–440)
RBC: 3.87 MIL/uL — AB (ref 4.40–5.90)
RDW: 20.4 % — ABNORMAL HIGH (ref 11.5–14.5)
WBC: 5.7 10*3/uL (ref 3.8–10.6)

## 2015-01-13 MED ORDER — SODIUM CHLORIDE 0.9 % IJ SOLN
10.0000 mL | INTRAMUSCULAR | Status: DC | PRN
  Administered 2015-01-13: 10 mL via INTRAVENOUS
  Filled 2015-01-13: qty 10

## 2015-01-13 MED ORDER — DEXTROSE 5 % IV SOLN
400.0000 mg | Freq: Once | INTRAVENOUS | Status: AC
  Administered 2015-01-13: 400 mg via INTRAVENOUS
  Filled 2015-01-13: qty 6.67

## 2015-01-13 MED ORDER — SODIUM CHLORIDE 0.9 % IV SOLN
2400.0000 mg/m2 | INTRAVENOUS | Status: DC
  Administered 2015-01-13: 5400 mg via INTRAVENOUS
  Filled 2015-01-13: qty 108

## 2015-01-13 MED ORDER — FLUOROURACIL CHEMO INJECTION 2.5 GM/50ML
400.0000 mg/m2 | Freq: Once | INTRAVENOUS | Status: AC
  Administered 2015-01-13: 900 mg via INTRAVENOUS
  Filled 2015-01-13: qty 18

## 2015-01-13 MED ORDER — ATROPINE SULFATE 0.4 MG/ML IJ SOLN
0.4000 mg | Freq: Once | INTRAMUSCULAR | Status: AC | PRN
  Administered 2015-01-13: 0.4 mg via INTRAVENOUS
  Filled 2015-01-13: qty 1

## 2015-01-13 MED ORDER — SODIUM CHLORIDE 0.9 % IV SOLN
Freq: Once | INTRAVENOUS | Status: AC
  Administered 2015-01-13: 11:00:00 via INTRAVENOUS
  Filled 2015-01-13: qty 1000

## 2015-01-13 MED ORDER — SODIUM CHLORIDE 0.9 % IV SOLN
Freq: Once | INTRAVENOUS | Status: AC
  Administered 2015-01-13: 11:00:00 via INTRAVENOUS
  Filled 2015-01-13: qty 8

## 2015-01-13 MED ORDER — HEPARIN SOD (PORK) LOCK FLUSH 100 UNIT/ML IV SOLN
500.0000 [IU] | Freq: Once | INTRAVENOUS | Status: DC
  Filled 2015-01-13: qty 5

## 2015-01-13 MED ORDER — DEXTROSE 5 % IV SOLN
400.0000 mg/m2 | Freq: Once | INTRAVENOUS | Status: AC
  Administered 2015-01-13: 900 mg via INTRAVENOUS
  Filled 2015-01-13: qty 35

## 2015-01-13 NOTE — Progress Notes (Signed)
Patient here today for ongoing follow up and treatment consideration regarding esophageal cancer. Patient denies any concerns today.

## 2015-01-15 ENCOUNTER — Ambulatory Visit: Payer: Medicare Other | Admitting: Radiation Oncology

## 2015-01-15 ENCOUNTER — Inpatient Hospital Stay: Payer: Medicare Other

## 2015-01-15 ENCOUNTER — Inpatient Hospital Stay: Payer: Medicare Other | Attending: Oncology

## 2015-01-15 VITALS — BP 110/63 | HR 83 | Temp 98.3°F | Resp 20

## 2015-01-15 DIAGNOSIS — Z95828 Presence of other vascular implants and grafts: Secondary | ICD-10-CM

## 2015-01-15 DIAGNOSIS — C159 Malignant neoplasm of esophagus, unspecified: Secondary | ICD-10-CM | POA: Insufficient documentation

## 2015-01-15 MED ORDER — SODIUM CHLORIDE 0.9 % IJ SOLN
10.0000 mL | INTRAMUSCULAR | Status: AC | PRN
  Administered 2015-01-15: 10 mL via INTRAVENOUS
  Filled 2015-01-15: qty 10

## 2015-01-15 MED ORDER — HEPARIN SOD (PORK) LOCK FLUSH 100 UNIT/ML IV SOLN
500.0000 [IU] | Freq: Once | INTRAVENOUS | Status: AC
  Administered 2015-01-15: 500 [IU] via INTRAVENOUS

## 2015-01-15 MED ORDER — HEPARIN SOD (PORK) LOCK FLUSH 100 UNIT/ML IV SOLN
INTRAVENOUS | Status: AC
  Filled 2015-01-15: qty 5

## 2015-01-22 ENCOUNTER — Ambulatory Visit: Payer: Medicare Other | Admitting: Radiation Oncology

## 2015-01-24 NOTE — Progress Notes (Signed)
[place]  [phone]) [phone] [phone]) [phone]  ID: Jeremy Gordon OB: 03/09/1941  MR#: [id number]  NGE#:[id number]  Patient Care Team: No Pcp Per Patient as PCP - General (General Practice) No Pcp Per Patient (General Practice)  CHIEF COMPLAINT:  Chief Complaint  Patient presents with  . Esophageal Cancer    follow up    INTERVAL HISTORY: Patient returns to clinic today for further evaluation and consideration of cycle 4 of FOLFIRI.  He continues to have difficulty swallowing, but this continues to improve. He otherwise feels well. He has no neurologic complaints. He denies any recent fevers. He denies any chest pain, shortness of breath, or cough. He denies any nausea, vomiting, constipation, or diarrhea. He has no melena or hematochezia. He has no urinary complaints. Patient offers no further specific complaints today.  REVIEW OF SYSTEMS:   Review of Systems  Constitutional: Positive for weight loss. Negative for fever.  Respiratory: Negative.   Cardiovascular: Negative.   Gastrointestinal: Negative.   Endo/Heme/Allergies: Does not bruise/bleed easily.    As per HPI. Otherwise, a complete review of systems is negatve.  PAST MEDICAL HISTORY:  Hypertension, sleep apnea, atrial fibrillation.  PAST SURGICAL HISTORY: Negative.  FAMILY HISTORY: Reviewed and unchanged. No report of malignancy or chronic disease.     ADVANCED DIRECTIVES:    HEALTH MAINTENANCE: Social History  Substance Use Topics  . Smoking status: Former Smoker -- 80 years  . Smokeless tobacco: Current User    Types: Snuff  . Alcohol Use: No     Colonoscopy:  PAP:  Bone density:  Lipid panel:  No Known Allergies  Current Outpatient Prescriptions  Medication Sig Dispense Refill  . albuterol (PROAIR HFA) 108 (90 BASE) MCG/ACT inhaler Inhale 2 puffs into the lungs every 4 (four) hours as needed for wheezing.     . diltiazem (DILACOR XR) 120 MG 24 hr capsule Take 120 mg  by mouth daily.     . enalapril (VASOTEC) 20 MG tablet Take 20 mg by mouth 2 (two) times daily.     . finasteride (PROSCAR) 5 MG tablet Take 5 mg by mouth daily.     . fluticasone (FLONASE) 50 MCG/ACT nasal spray 1 spray by Each Nare route nightly. Frequency:QD   Dosage:50   MCG  Instructions:  Note:Dose: 45 MCG    . furosemide (LASIX) 20 MG tablet Take 20 mg by mouth daily.     Marland Kitchen gabapentin (NEURONTIN) 300 MG capsule Take 300 mg by mouth 2 (two) times daily.     Marland Kitchen HYDROcodone-acetaminophen (LORTAB) 10-500 MG per tablet Take by mouth every 8 (eight) hours as needed for pain.     . metoprolol (LOPRESSOR) 50 MG tablet Take 50 mg by mouth 2 (two) times daily.     Marland Kitchen nystatin (MYCOSTATIN) 100000 UNIT/ML suspension     . omeprazole (PRILOSEC) 20 MG capsule TAKE ONE (1) CAPSULE BY MOUTH 2 TIMES DAILY    . prochlorperazine (COMPAZINE) 10 MG tablet Take 10 mg by mouth every 6 (six) hours as needed for nausea or vomiting.    . tamsulosin (FLOMAX) 0.4 MG CAPS capsule Take 0.4 mg by mouth daily.     Marland Kitchen warfarin (COUMADIN) 4 MG tablet Take 12 mg by mouth daily at 6 PM.     . amoxicillin (AMOXIL) 500 MG tablet Take 1 tablet (500 mg total) by mouth 2 (two) times daily. Stop taking this medication once the packing device has been removed from your nose. (Patient not taking:  Reported on 01/13/2015) 20 tablet 0  . sucralfate (CARAFATE) 1 GM/10ML suspension Take by mouth.     No current facility-administered medications for this visit.   Facility-Administered Medications Ordered in Other Visits  Medication Dose Route Frequency Provider Last Rate Last Dose  . 0.9 %  sodium chloride infusion   Intravenous Once Lloyd Huger, MD      . sodium chloride 0.9 % injection 10 mL  10 mL Intravenous PRN Lloyd Huger, MD   10 mL at 12/23/14 0945  . sodium chloride 0.9 % injection 10 mL  10 mL Intracatheter PRN Lloyd Huger, MD   10 mL at 12/30/14 0947  . sodium chloride 0.9 % injection 10 mL  10 mL  Intravenous PRN Lloyd Huger, MD   10 mL at 01/15/15 1512    OBJECTIVE: Filed Vitals:   01/13/15 0956  BP: 122/68  Temp: 95.7 F (35.4 C)  Resp: 20     Body mass index is 35.45 kg/(m^2).    ECOG FS:0 - Asymptomatic  General: Well-developed, well-nourished, no acute distress. Eyes: anicteric sclera. Lungs: Clear to auscultation bilaterally. Heart: Regular rate and rhythm. No rubs, murmurs, or gallops. Abdomen: Soft, nontender, nondistended. No organomegaly noted, normoactive bowel sounds. Musculoskeletal: No edema, cyanosis, or clubbing. Neuro: Alert, answering all questions appropriately. Cranial nerves grossly intact. Skin: No rashes or petechiae noted. Psych: Normal affect.   LAB RESULTS:  Lab Results  Component Value Date   NA 140 01/13/2015   K 3.7 01/13/2015   CL 105 01/13/2015   CO2 30 01/13/2015   GLUCOSE 100* 01/13/2015   BUN 11 01/13/2015   CREATININE 0.97 01/13/2015   CALCIUM 8.3* 01/13/2015   PROT 5.9* 01/13/2015   ALBUMIN 3.3* 01/13/2015   AST 21 01/13/2015   ALT 14* 01/13/2015   ALKPHOS 104 01/13/2015   BILITOT 0.6 01/13/2015   GFRNONAA >60 01/13/2015   GFRAA >60 01/13/2015    Lab Results  Component Value Date   WBC 5.7 01/13/2015   NEUTROABS 4.0 01/13/2015   HGB 11.8* 01/13/2015   HCT 34.4* 01/13/2015   MCV 89.0 01/13/2015   PLT 138* 01/13/2015     STUDIES: No results found.  ASSESSMENT: Recurrent adenocarcinoma of the esophagus, HER-2 negative.  PLAN:    1. Esophageal cancer: CT reviewed independently. Patient also had recent evaluation at [facility] documenting his persistent or recurrent disease.  Proceed with cycle 4 of FOLFIRI today.  Plan to give treatment every 2 weeks with reimaging after approximately 6 cycles. PEG tube has also been discussed to help improve his nutrition, but patient continues to refuse this procedure at this time.  Return to clinic in 2 days for pump removal and Neulasta and then in 2 weeks for consideration of  cycle 5.  2. Atrial fibrillation: Continue Coumadin as prescribed. INRs are monitored by his primary care physician. 3. Thrombocytopenia: MIld. Secondary to chemotherapy. Monitor. 4. Difficulty swallowing: Secondary to persistent malignancy. Patient has refused PEG tube placement. 5. Nosebleed: Resolved.   Patient expressed understanding and was in agreement with this plan. He also understands that He can call clinic at any time with any questions, concerns, or complaints.    Lloyd Huger, MD   01/24/2015 3:50 PM

## 2015-01-26 ENCOUNTER — Encounter: Payer: Self-pay | Admitting: Emergency Medicine

## 2015-01-26 ENCOUNTER — Emergency Department: Payer: Medicare Other

## 2015-01-26 ENCOUNTER — Telehealth: Payer: Self-pay | Admitting: *Deleted

## 2015-01-26 ENCOUNTER — Inpatient Hospital Stay
Admission: EM | Admit: 2015-01-26 | Discharge: 2015-02-02 | DRG: 871 | Disposition: A | Discharge status: Skilled Nursing Facility | Payer: Medicare Other | Attending: Internal Medicine | Admitting: Internal Medicine

## 2015-01-26 DIAGNOSIS — J690 Pneumonitis due to inhalation of food and vomit: Secondary | ICD-10-CM

## 2015-01-26 DIAGNOSIS — Z720 Tobacco use: Secondary | ICD-10-CM

## 2015-01-26 DIAGNOSIS — A419 Sepsis, unspecified organism: Secondary | ICD-10-CM | POA: Diagnosis present

## 2015-01-26 DIAGNOSIS — T451X5A Adverse effect of antineoplastic and immunosuppressive drugs, initial encounter: Secondary | ICD-10-CM | POA: Diagnosis present

## 2015-01-26 DIAGNOSIS — R1011 Right upper quadrant pain: Secondary | ICD-10-CM | POA: Diagnosis present

## 2015-01-26 DIAGNOSIS — Z8501 Personal history of malignant neoplasm of esophagus: Secondary | ICD-10-CM | POA: Diagnosis not present

## 2015-01-26 DIAGNOSIS — D61810 Antineoplastic chemotherapy induced pancytopenia: Secondary | ICD-10-CM | POA: Diagnosis not present

## 2015-01-26 DIAGNOSIS — I481 Persistent atrial fibrillation: Secondary | ICD-10-CM | POA: Diagnosis present

## 2015-01-26 DIAGNOSIS — I4891 Unspecified atrial fibrillation: Secondary | ICD-10-CM | POA: Insufficient documentation

## 2015-01-26 DIAGNOSIS — G4730 Sleep apnea, unspecified: Secondary | ICD-10-CM | POA: Diagnosis not present

## 2015-01-26 DIAGNOSIS — R1310 Dysphagia, unspecified: Secondary | ICD-10-CM | POA: Diagnosis not present

## 2015-01-26 DIAGNOSIS — C159 Malignant neoplasm of esophagus, unspecified: Secondary | ICD-10-CM | POA: Diagnosis not present

## 2015-01-26 DIAGNOSIS — R748 Abnormal levels of other serum enzymes: Secondary | ICD-10-CM | POA: Diagnosis present

## 2015-01-26 DIAGNOSIS — Z79899 Other long term (current) drug therapy: Secondary | ICD-10-CM

## 2015-01-26 DIAGNOSIS — E876 Hypokalemia: Secondary | ICD-10-CM | POA: Diagnosis not present

## 2015-01-26 DIAGNOSIS — I4819 Other persistent atrial fibrillation: Secondary | ICD-10-CM

## 2015-01-26 DIAGNOSIS — I472 Ventricular tachycardia, unspecified: Secondary | ICD-10-CM

## 2015-01-26 DIAGNOSIS — J189 Pneumonia, unspecified organism: Secondary | ICD-10-CM | POA: Insufficient documentation

## 2015-01-26 DIAGNOSIS — Z7901 Long term (current) use of anticoagulants: Secondary | ICD-10-CM | POA: Diagnosis not present

## 2015-01-26 DIAGNOSIS — I340 Nonrheumatic mitral (valve) insufficiency: Secondary | ICD-10-CM | POA: Diagnosis not present

## 2015-01-26 DIAGNOSIS — Z8249 Family history of ischemic heart disease and other diseases of the circulatory system: Secondary | ICD-10-CM | POA: Diagnosis not present

## 2015-01-26 DIAGNOSIS — I10 Essential (primary) hypertension: Secondary | ICD-10-CM

## 2015-01-26 DIAGNOSIS — D696 Thrombocytopenia, unspecified: Secondary | ICD-10-CM | POA: Diagnosis not present

## 2015-01-26 DIAGNOSIS — J449 Chronic obstructive pulmonary disease, unspecified: Secondary | ICD-10-CM | POA: Diagnosis not present

## 2015-01-26 DIAGNOSIS — E860 Dehydration: Secondary | ICD-10-CM | POA: Diagnosis present

## 2015-01-26 DIAGNOSIS — Z9889 Other specified postprocedural states: Secondary | ICD-10-CM | POA: Diagnosis not present

## 2015-01-26 HISTORY — DX: Personal history of other medical treatment: Z92.89

## 2015-01-26 HISTORY — DX: Essential (primary) hypertension: I10

## 2015-01-26 HISTORY — DX: Chronic obstructive pulmonary disease, unspecified: J44.9

## 2015-01-26 HISTORY — DX: Other persistent atrial fibrillation: I48.19

## 2015-01-26 LAB — CBC WITH DIFFERENTIAL/PLATELET
BASOS ABS: 0 10*3/uL (ref 0–0.1)
Basophils Relative: 0 %
Eosinophils Absolute: 0 10*3/uL (ref 0–0.7)
Eosinophils Relative: 0 %
HEMATOCRIT: 34.2 % — AB (ref 40.0–52.0)
Hemoglobin: 11.6 g/dL — ABNORMAL LOW (ref 13.0–18.0)
LYMPHS ABS: 0.4 10*3/uL — AB (ref 1.0–3.6)
LYMPHS PCT: 9 %
MCH: 31.2 pg (ref 26.0–34.0)
MCHC: 33.8 g/dL (ref 32.0–36.0)
MCV: 92.4 fL (ref 80.0–100.0)
MONO ABS: 0.5 10*3/uL (ref 0.2–1.0)
MONOS PCT: 12 %
NEUTROS ABS: 3.4 10*3/uL (ref 1.4–6.5)
Neutrophils Relative %: 79 %
Platelets: 151 10*3/uL (ref 150–440)
RBC: 3.7 MIL/uL — ABNORMAL LOW (ref 4.40–5.90)
RDW: 20.9 % — AB (ref 11.5–14.5)
WBC: 4.3 10*3/uL (ref 3.8–10.6)

## 2015-01-26 LAB — LIPASE, BLOOD: Lipase: 19 U/L — ABNORMAL LOW (ref 22–51)

## 2015-01-26 LAB — URINALYSIS COMPLETE WITH MICROSCOPIC (ARMC ONLY)
BACTERIA UA: NONE SEEN
Bilirubin Urine: NEGATIVE
GLUCOSE, UA: NEGATIVE mg/dL
Ketones, ur: NEGATIVE mg/dL
LEUKOCYTES UA: NEGATIVE
Nitrite: NEGATIVE
PH: 5 (ref 5.0–8.0)
Protein, ur: 100 mg/dL — AB
SQUAMOUS EPITHELIAL / LPF: NONE SEEN
Specific Gravity, Urine: 1.024 (ref 1.005–1.030)

## 2015-01-26 LAB — COMPREHENSIVE METABOLIC PANEL
ALBUMIN: 3.4 g/dL — AB (ref 3.5–5.0)
ALT: 15 U/L — ABNORMAL LOW (ref 17–63)
ANION GAP: 9 (ref 5–15)
AST: 23 U/L (ref 15–41)
Alkaline Phosphatase: 72 U/L (ref 38–126)
BUN: 19 mg/dL (ref 6–20)
CHLORIDE: 97 mmol/L — AB (ref 101–111)
CO2: 27 mmol/L (ref 22–32)
Calcium: 8.9 mg/dL (ref 8.9–10.3)
Creatinine, Ser: 1.08 mg/dL (ref 0.61–1.24)
GFR calc Af Amer: >60 mL/min (ref >60)
GFR calc non Af Amer: >60 mL/min (ref >60)
GLUCOSE: 128 mg/dL — AB (ref 65–99)
POTASSIUM: 3.6 mmol/L (ref 3.5–5.1)
SODIUM: 133 mmol/L — AB (ref 135–145)
TOTAL PROTEIN: 6.6 g/dL (ref 6.5–8.1)
Total Bilirubin: 1.7 mg/dL — ABNORMAL HIGH (ref 0.3–1.2)

## 2015-01-26 LAB — TROPONIN I
Troponin I: 0.11 ng/mL — ABNORMAL HIGH (ref <0.031)
Troponin I: 0.1 ng/mL — ABNORMAL HIGH (ref <0.031)

## 2015-01-26 LAB — LACTIC ACID, PLASMA
LACTIC ACID, VENOUS: 2.6 mmol/L — AB (ref 0.5–2.0)
LACTIC ACID, VENOUS: 2.1 mmol/L — AB (ref 0.5–2.0)

## 2015-01-26 LAB — PROTIME-INR
INR: 2.39
Prothrombin Time: 26.2 seconds — ABNORMAL HIGH (ref 11.4–15.0)

## 2015-01-26 MED ORDER — ACETAMINOPHEN 650 MG RE SUPP
650.0000 mg | RECTAL | Status: DC | PRN
  Administered 2015-01-26: 650 mg via RECTAL
  Filled 2015-01-26: qty 1

## 2015-01-26 MED ORDER — VANCOMYCIN HCL IN DEXTROSE 1-5 GM/200ML-% IV SOLN
1000.0000 mg | Freq: Once | INTRAVENOUS | Status: AC
  Administered 2015-01-26: 1000 mg via INTRAVENOUS
  Filled 2015-01-26: qty 200

## 2015-01-26 MED ORDER — IOHEXOL 300 MG/ML  SOLN
100.0000 mL | Freq: Once | INTRAMUSCULAR | Status: AC | PRN
  Administered 2015-01-26: 100 mL via INTRAVENOUS

## 2015-01-26 MED ORDER — SODIUM CHLORIDE 0.9 % IV BOLUS (SEPSIS)
1000.0000 mL | INTRAVENOUS | Status: AC
  Administered 2015-01-26: 1000 mL via INTRAVENOUS

## 2015-01-26 MED ORDER — SUCRALFATE 1 GM/10ML PO SUSP
1.0000 g | Freq: Two times a day (BID) | ORAL | Status: DC
  Administered 2015-01-27 – 2015-02-02 (×12): 1 g via ORAL
  Filled 2015-01-26 (×13): qty 10

## 2015-01-26 MED ORDER — IOHEXOL 240 MG/ML SOLN
25.0000 mL | Freq: Once | INTRAMUSCULAR | Status: AC | PRN
  Administered 2015-01-26: 25 mL via ORAL

## 2015-01-26 MED ORDER — PIPERACILLIN-TAZOBACTAM 3.375 G IVPB 30 MIN
3.3750 g | Freq: Once | INTRAVENOUS | Status: AC
  Administered 2015-01-26: 3.375 g via INTRAVENOUS
  Filled 2015-01-26: qty 50

## 2015-01-26 MED ORDER — DEXTROSE 5 % IV SOLN
250.0000 mg | INTRAVENOUS | Status: DC
  Filled 2015-01-26 (×2): qty 250

## 2015-01-26 MED ORDER — DILTIAZEM HCL ER 120 MG PO CP24
120.0000 mg | ORAL_CAPSULE | Freq: Every day | ORAL | Status: DC
  Administered 2015-01-27 – 2015-01-28 (×2): 120 mg via ORAL
  Filled 2015-01-26 (×6): qty 1

## 2015-01-26 MED ORDER — WARFARIN SODIUM 4 MG PO TABS
12.0000 mg | ORAL_TABLET | Freq: Every evening | ORAL | Status: DC
  Administered 2015-01-27: 12 mg via ORAL
  Filled 2015-01-26: qty 2
  Filled 2015-01-26: qty 3

## 2015-01-26 MED ORDER — VANCOMYCIN HCL IN DEXTROSE 1-5 GM/200ML-% IV SOLN: 1000.0000 mg | Freq: Two times a day (BID) | INTRAVENOUS | Status: DC

## 2015-01-26 MED ORDER — WARFARIN - PHYSICIAN DOSING INPATIENT
Freq: Every day | Status: DC
  Administered 2015-01-28: 18:00:00
  Filled 2015-01-26 (×2): qty 1

## 2015-01-26 MED ORDER — SODIUM CHLORIDE 0.9 % IV SOLN: INTRAVENOUS | Status: AC

## 2015-01-26 MED ORDER — DEXTROSE 5 % IV SOLN
1.0000 g | INTRAVENOUS | Status: DC
  Filled 2015-01-26: qty 10

## 2015-01-26 MED ORDER — ALBUTEROL SULFATE (2.5 MG/3ML) 0.083% IN NEBU: 3.0000 mL | INHALATION_SOLUTION | RESPIRATORY_TRACT | Status: DC | PRN

## 2015-01-26 MED ORDER — FINASTERIDE 5 MG PO TABS
5.0000 mg | ORAL_TABLET | Freq: Every day | ORAL | Status: DC
  Administered 2015-01-27 – 2015-02-02 (×7): 5 mg via ORAL
  Filled 2015-01-26 (×7): qty 1

## 2015-01-26 MED ORDER — ACETAMINOPHEN 325 MG PO TABS
650.0000 mg | ORAL_TABLET | Freq: Four times a day (QID) | ORAL | Status: DC | PRN
  Administered 2015-01-27 – 2015-01-30 (×5): 650 mg via ORAL
  Filled 2015-01-26 (×5): qty 2

## 2015-01-26 MED ORDER — PIPERACILLIN-TAZOBACTAM 3.375 G IVPB: 3.3750 g | Freq: Three times a day (TID) | INTRAVENOUS | Status: DC

## 2015-01-26 MED ORDER — HYDROCODONE-ACETAMINOPHEN 10-325 MG PO TABS
1.0000 | ORAL_TABLET | Freq: Three times a day (TID) | ORAL | Status: DC | PRN
  Administered 2015-01-27 – 2015-02-01 (×3): 1 via ORAL
  Filled 2015-01-26 (×3): qty 1

## 2015-01-26 MED ORDER — PANTOPRAZOLE SODIUM 40 MG PO TBEC
40.0000 mg | DELAYED_RELEASE_TABLET | Freq: Every day | ORAL | Status: DC
  Administered 2015-01-27 – 2015-02-02 (×7): 40 mg via ORAL
  Filled 2015-01-26 (×9): qty 1

## 2015-01-26 MED ORDER — GABAPENTIN 300 MG PO CAPS
300.0000 mg | ORAL_CAPSULE | Freq: Two times a day (BID) | ORAL | Status: DC
  Administered 2015-01-27 – 2015-02-02 (×13): 300 mg via ORAL
  Filled 2015-01-26 (×13): qty 1

## 2015-01-26 MED ORDER — IBUPROFEN 100 MG/5ML PO SUSP
800.0000 mg | Freq: Once | ORAL | Status: AC
  Administered 2015-01-26: 800 mg via ORAL
  Filled 2015-01-26: qty 40

## 2015-01-26 MED ORDER — SODIUM CHLORIDE 0.9 % IV BOLUS (SEPSIS): 500.0000 mL | INTRAVENOUS | Status: AC

## 2015-01-26 MED ORDER — MAGIC MOUTHWASH
5.0000 mL | Freq: Four times a day (QID) | ORAL | Status: DC
  Administered 2015-01-27 – 2015-02-02 (×21): 10 mL via ORAL
  Filled 2015-01-26 (×28): qty 10

## 2015-01-26 NOTE — Progress Notes (Signed)
Pt started the night with a temp. Of 103. MD was notified put in an order for tylenol suppository. Temp is now 100.2 but pt is sweating perfusely. MD notified.

## 2015-01-26 NOTE — Progress Notes (Signed)
ANTIBIOTIC CONSULT NOTE - INITIAL  Pharmacy Consult for Vancomycin/Zosyn Indication: Sepsis  No Known Allergies  Patient Measurements: Height: 5\' 6"  (167.6 cm) Weight: 246 lb (111.585 kg) IBW/kg (Calculated) : 63.8 Adjusted Body Weight: 82.9 kg  Vital Signs: Temp: 101.1 F (38.4 C) (10/10 1348) Temp Source: Oral (10/10 1348) BP: 129/63 mmHg (10/10 1342) Pulse Rate: 76 (10/10 1343) Intake/Output from previous day:   Intake/Output from this shift:    Labs:  Recent Labs  01/26/15 1249  WBC 4.3  HGB 11.6*  PLT 151  CREATININE 1.08   Estimated Creatinine Clearance: 68.2 mL/min (by C-G formula based on Cr of 1.08). No results for input(s): VANCOTROUGH, VANCOPEAK, VANCORANDOM, GENTTROUGH, GENTPEAK, GENTRANDOM, TOBRATROUGH, TOBRAPEAK, TOBRARND, AMIKACINPEAK, AMIKACINTROU, AMIKACIN in the last 72 hours.   Microbiology: No results found for this or any previous visit (from the past 720 hour(s)).  Medical History: Past Medical History  Diagnosis Date  . Cancer ([place])   . Hypertension   . Esophageal carcinoma ([place])   . Sleep apnea   . Atrial fibrillation ([facility])     Medications:  Anti-infectives    Start     Dose/Rate Route Frequency Ordered Stop   01/26/15 1430  piperacillin-tazobactam (ZOSYN) IVPB 3.375 g     3.375 g 100 mL/hr over 30 Minutes Intravenous  Once 01/26/15 1423     01/26/15 1430  vancomycin (VANCOCIN) IVPB 1000 mg/200 mL premix     1,000 mg 200 mL/hr over 60 Minutes Intravenous  Once 01/26/15 1423       Assessment: 74 yo male admitted for sepsis with a fever. Pharmacy consulted to dose vancomycin and zosyn.   PK Parameters: Dosing Weight: 82.9 kg Ke: 0.06 T1/2: 11.5 hrs Vd: 58.1 L  Goal of Therapy:  Vancomycin trough level 15-20 mcg/ml  Plan:  Vanc 1000mg  IV and Zosyn 3.375g IV given x1 in the ED. Will start vancomycin 1000mg  IV Q12H ~6 hours after ED dose for stacked dosing.  Trough prior to 5th dose. Will start zosyn 3.375g IV Q8H EI  infusion. Follow up culture results  Norma Fredrickson, PharmD Clinical Pharmacist 01/26/2015,2:35 PM

## 2015-01-26 NOTE — ED Notes (Signed)
Patient transported to X-ray 

## 2015-01-26 NOTE — ED Notes (Signed)
Pt presents to ED from home with fever as high as 101. Per family, stumbling around house, decreased appetite, with delayed response since yesterday.

## 2015-01-26 NOTE — ED Provider Notes (Signed)
[facility] Emergency Department Provider Note  ____________________________________________  Time seen: Approximately 2 PM  I have reviewed the triage vital signs and the nursing notes.   HISTORY  Chief Complaint Fever; Dehydration; and Weakness    HPI Jeremy Gordon is a 74 y.o. male with a history of esophageal carcinoma who is presenting today with a fever over the last day. He said that he had a fever 102 at home and an ambulance was called by his family but he refused transport. He has been diffusely weak today with right upper quadrant pain and loss of appetite.  He says the pain is intermittent and mild to moderate. Denies any nausea vomiting or diarrhea. His last chemotherapy was 2 weeks ago.   Past Medical History  Diagnosis Date  . Cancer ([place])   . Hypertension   . Esophageal carcinoma ([place])   . Sleep apnea   . Atrial fibrillation [facility])     Patient Active Problem List   Diagnosis Date Noted  . Esophageal cancer ([place]) 11/19/2014  . Chest wall pain 10/08/2014  . Can't get food down 02/05/2014  . Apnea, sleep 10/09/2013  . Carpal tunnel syndrome 03/11/2013  . Sciatica 11/23/2012  . Acid reflux 11/23/2012  . 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 Allergic rhinitis 08/15/2012  . Back ache 08/15/2012  . Atrial fibrillation, chronic ([place]) 08/15/2012  . CAFL (chronic airflow limitation) ([place]) 08/15/2012  . Breathlessness on exertion 08/15/2012  . BP (high blood pressure) 08/15/2012  . Malaise and fatigue 08/15/2012  . 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 Arthritis of knee, degenerative 08/15/2012  . 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 Lumbar canal stenosis 08/15/2012  . 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 Peripheral vascular catheterization N/A 11/20/2014    Procedure: Glori Luis Cath Insertion;  Surgeon: Algernon Huxley, MD;  Location: [place] [facility];  Service: Cardiovascular;  Laterality: N/A;    Current Outpatient Rx  Name  Route  Sig  Dispense  Refill  . albuterol (PROVENTIL HFA;VENTOLIN HFA) 108 (90 BASE) MCG/ACT inhaler   Inhalation   Inhale 2 puffs into the lungs every 4 (four) hours as needed for wheezing or shortness of breath.         . diltiazem (CARDIZEM CD) 120 MG 24 hr capsule   Oral   Take 120 mg by mouth daily.         . enalapril (VASOTEC) 20 MG tablet   Oral   Take 20 mg by mouth 2 (two) times daily.         . finasteride (PROSCAR) 5 MG tablet   Oral   Take 5 mg by mouth daily.         Marland Kitchen gabapentin (NEURONTIN) 300 MG capsule   Oral   Take 300 mg by mouth 2 (two) times daily.         Marland Kitchen HYDROcodone-acetaminophen (NORCO) 10-325 MG tablet   Oral   Take 1 tablet by mouth every 8 (eight) hours as needed for severe pain.         . magic mouthwash SOLN   Oral   Take 5-10 mLs by mouth 4 (four) times daily.         . metoprolol (LOPRESSOR) 50 MG tablet   Oral   Take 50 mg by mouth 2 (two) times daily.         Marland Kitchen omeprazole (  PRILOSEC) 20 MG capsule   Oral   Take 20 mg by mouth 2 (two) times daily.         . sucralfate (CARAFATE) 1 GM/10ML suspension   Oral   Take 1 g by mouth 2 (two) times daily.         Marland Kitchen warfarin (COUMADIN) 4 MG tablet   Oral   Take 12 mg by mouth every evening.         Marland Kitchen amoxicillin (AMOXIL) 500 MG tablet   Oral   Take 1 tablet (500 mg total) by mouth 2 (two) times daily. Stop taking this medication once the packing device has been removed from your nose. Patient not taking: Reported on 01/13/2015   20 tablet   0     Allergies Review of patient's allergies indicates no known allergies.  History reviewed. No pertinent family history.  Social History Social History  Substance Use Topics  . Smoking status: Former Smoker -- 74 years  . Smokeless tobacco: Current User    Types: Snuff  . Alcohol Use: No    Review of  Systems Constitutional: Shaking chills and fever. Eyes: No visual changes. ENT: No sore throat. Cardiovascular: Denies chest pain. Respiratory: Denies shortness of breath. Gastrointestinal:   No nausea, no vomiting.  No diarrhea.  No constipation. Genitourinary: Negative for dysuria. Musculoskeletal: Negative for back pain. Skin: Negative for rash. Neurological: Negative for headaches, focal weakness or numbness.  10-point ROS otherwise negative.  ____________________________________________   PHYSICAL EXAM:  VITAL SIGNS: ED Triage Vitals  Enc Vitals Group     BP 01/26/15 1232 99/47 mmHg     Pulse Rate 01/26/15 1232 102     Resp 01/26/15 1232 20     Temp 01/26/15 1232 99.9 F (37.7 C)     Temp Source 01/26/15 1232 Oral     SpO2 01/26/15 1232 97 %     Weight 01/26/15 1232 246 lb (111.585 kg)     Height 01/26/15 1232 5\' 6"  (1.676 m)     Head Cir --      Peak Flow --      Pain Score 01/26/15 1233 6     Pain Loc --      Pain Edu? --      Excl. in [place]? --     Constitutional: Alert and oriented. Well appearing and in no acute distress. Eyes: Conjunctivae are normal. PERRL. EOMI. Head: Atraumatic. Nose: No congestion/rhinnorhea. Mouth/Throat: Mucous membranes are moist.  Oropharynx non-erythematous. Neck: No stridor.   Cardiovascular: Normal rate, regular rhythm. Grossly normal heart sounds.  Good peripheral circulation. Port-A-Cath in the right side of the chest nontender and without any surrounding erythema or induration. Respiratory: Normal respiratory effort.  No retractions. Lungs CTAB. Gastrointestinal: Soft with mild right upper quadrant and epigastric tenderness palpation. There is a negative Murphy sign.. No distention. No abdominal bruits. No CVA tenderness. Musculoskeletal: No lower extremity tenderness nor edema.  No joint effusions. Neurologic:  Normal speech and language. No gross focal neurologic deficits are appreciated. No gait instability. Skin:  Skin is  warm, dry and intact. No rash noted. Psychiatric: Mood and affect are normal. Speech and behavior are normal.  ____________________________________________   LABS (all labs ordered are listed, but only abnormal results are displayed)  Labs Reviewed  COMPREHENSIVE METABOLIC PANEL - Abnormal; Notable for the following:    Sodium 133 (*)    Chloride 97 (*)    Glucose, Bld 128 (*)    Albumin 3.4 (*)  ALT 15 (*)    Total Bilirubin 1.7 (*)    All other components within normal limits  CBC WITH DIFFERENTIAL/PLATELET - Abnormal; Notable for the following:    RBC 3.70 (*)    Hemoglobin 11.6 (*)    HCT 34.2 (*)    RDW 20.9 (*)    Lymphs Abs 0.4 (*)    All other components within normal limits  URINALYSIS COMPLETEWITH MICROSCOPIC ([facility] ONLY) - Abnormal; Notable for the following:    Color, Urine AMBER (*)    APPearance CLEAR (*)    Hgb urine dipstick 2+ (*)    Protein, ur 100 (*)    All other components within normal limits  LACTIC ACID, PLASMA - Abnormal; Notable for the following:    Lactic Acid, Venous 2.6 (*)    All other components within normal limits  LIPASE, BLOOD - Abnormal; Notable for the following:    Lipase 19 (*)    All other components within normal limits  TROPONIN I - Abnormal; Notable for the following:    Troponin I 0.11 (*)    All other components within normal limits  CULTURE, BLOOD (ROUTINE X 2)  CULTURE, BLOOD (ROUTINE X 2)  URINE CULTURE  LACTIC ACID, PLASMA  PROTIME-INR   ____________________________________________  EKG  ED ECG REPORT I, Doran Stabler, the attending physician, personally viewed and interpreted this ECG.   Date: 01/26/2015  EKG Time: 1543  Rate: 42  Rhythm: sinus bradycardia  Axis: Normal axis  Intervals:none  ST&T Change: T-wave inversion in aVL. No ST elevation or depression. No previous EKG for comparison.  ____________________________________________  RADIOLOGY  CT of the abdomen and pelvis with right lower  lobe pneumonia. Chest x-ray with no active disease. I personally reviewed this film. ____________________________________________   PROCEDURES  CRITICAL CARE Performed by: Doran Stabler   Total critical care time: 35 minutes  Critical care time was exclusive of separately billable procedures and treating other patients.  Critical care was necessary to treat or prevent imminent or life-threatening deterioration.  Critical care was time spent personally by me on the following activities: development of treatment plan with patient and/or surrogate as well as nursing, discussions with consultants, evaluation of patient's response to treatment, examination of patient, obtaining history from patient or surrogate, ordering and performing treatments and interventions, ordering and review of laboratory studies, ordering and review of radiographic studies, pulse oximetry and re-evaluation of patient's condition.   ____________________________________________   INITIAL IMPRESSION / ASSESSMENT AND PLAN / ED COURSE  Pertinent labs & imaging results that were available during my care of the patient were reviewed by me and considered in my medical decision making (see chart for details).  ----------------------------------------- 4:11 PM on 01/26/2015 -----------------------------------------  Patient aware of need for admission. Signed out to Dr. Anselm Jungling. Discussed case with Dr. Grayland Ormond who says the patient would be appropriate for heparin from an oncologic standpoint. Still pending INR because patient is on Coumadin. Dr. Boyce Medici aware of pending labs. ____________________________________________   FINAL CLINICAL IMPRESSION(S) / ED DIAGNOSES  Final diagnoses:  RUQ abdominal pain   acute sepsis secondary to right lower lobe pneumonia.    Orbie Pyo, MD 01/26/15 [phone]

## 2015-01-26 NOTE — Telephone Encounter (Signed)
Sister called reporting that she doesn't think patient can walk and has been having shortness of breath. She is sending another family member to check on him this morning. Instructed that if patient can not walk and is short of breath, that EMS needs to be called. Sister is reluctant to do this and requests someone from Dr. Gary Fleet team to call her back at [phone].

## 2015-01-26 NOTE — ED Notes (Signed)
Lab called to report a lactic acid of 2.1. Primary 2A RN notified. Will also notify admitting MD

## 2015-01-26 NOTE — Telephone Encounter (Signed)
Spoke with Enid Derry, patients sister regarding patients condition. Patient is short of breath, weak, able to ambulate minimally to the bathroom and back to bed. I spoke with Dr. Grayland Ormond and he recommends patient being evaluated in the ER. Patients family verbalized understanding and states they will take patient to the ER.

## 2015-01-26 NOTE — H&P (Signed)
[place] [facility] [place] NAME: Jeremy Gordon    MR#:  [id number]  DATE OF BIRTH:  Dec 12, 1940  DATE OF ADMISSION:  01/26/2015  PRIMARY CARE PHYSICIAN: No PCP Per Patient   REQUESTING/REFERRING PHYSICIAN: [place]  CHIEF COMPLAINT:   Chief Complaint  Patient presents with  . Fever  . Dehydration  . Weakness    HISTORY OF PRESENT ILLNESS: Jalynn Betzold  is a 74 y.o. male with a known history of esophageal cancer on radiation and chemotherapy, hypertension, sleep apnea, atrial fibrillation- since yesterday his feeling gradually more and more weak and last night he could not even get up to go to the bathroom and family noticed he is having fever. They called Dr. Beverly Gust office to arrange for his next cycle of chemotherapy but he told as he is having fever is better to take him to emergency room rather than bringing him for chemotherapy and they brought him here. History obtained from his sister who is present in the room as patient is confused she told that patient was also complaining of some pain in his right side of abdomen upper quadrant and also complaining of some shortness of breath. In ER on CT scan of abdomen he was noted to the right lower lobe pneumonia.  PAST MEDICAL HISTORY:   Past Medical History  Diagnosis Date  . Cancer ([place])   . Hypertension   . Esophageal carcinoma ([place])   . Sleep apnea   . Atrial fibrillation ([place])     PAST SURGICAL HISTORY:  Past Surgical History  Procedure Laterality Date  . Peripheral vascular catheterization N/A 11/20/2014    Procedure: Glori Luis Cath Insertion;  Surgeon: Algernon Huxley, MD;  Location: [place] [facility];  Service: Cardiovascular;  Laterality: N/A;    SOCIAL HISTORY:  Social History  Substance Use Topics  . Smoking status: Former Smoker -- 9 years  . Smokeless tobacco: Current User    Types: Snuff  . Alcohol Use: No    FAMILY HISTORY:  Family History  Problem Relation Age  of Onset  . Hypertension Sister   . Hypertension Brother     DRUG ALLERGIES: No Known Allergies  REVIEW OF SYSTEMS:   Patient is confused and not able to give me review of system.  MEDICATIONS AT HOME:  Prior to Admission medications   Medication Sig Start Date End Date Taking? Authorizing Provider  albuterol (PROVENTIL HFA;VENTOLIN HFA) 108 (90 BASE) MCG/ACT inhaler Inhale 2 puffs into the lungs every 4 (four) hours as needed for wheezing or shortness of breath.   Yes Historical Provider, MD  diltiazem (CARDIZEM CD) 120 MG 24 hr capsule Take 120 mg by mouth daily.   Yes Historical Provider, MD  enalapril (VASOTEC) 20 MG tablet Take 20 mg by mouth 2 (two) times daily.   Yes Historical Provider, MD  finasteride (PROSCAR) 5 MG tablet Take 5 mg by mouth daily.   Yes Historical Provider, MD  gabapentin (NEURONTIN) 300 MG capsule Take 300 mg by mouth 2 (two) times daily.   Yes Historical Provider, MD  HYDROcodone-acetaminophen (NORCO) 10-325 MG tablet Take 1 tablet by mouth every 8 (eight) hours as needed for severe pain.   Yes Historical Provider, MD  magic mouthwash SOLN Take 5-10 mLs by mouth 4 (four) times daily.   Yes Historical Provider, MD  metoprolol (LOPRESSOR) 50 MG tablet Take 50 mg by mouth 2 (two) times daily.   Yes Historical Provider, MD  omeprazole ([place])  20 MG capsule Take 20 mg by mouth 2 (two) times daily.   Yes Historical Provider, MD  sucralfate (CARAFATE) 1 GM/10ML suspension Take 1 g by mouth 2 (two) times daily.   Yes Historical Provider, MD  warfarin (COUMADIN) 4 MG tablet Take 12 mg by mouth every evening.   Yes Historical Provider, MD  amoxicillin (AMOXIL) 500 MG tablet Take 1 tablet (500 mg total) by mouth 2 (two) times daily. Stop taking this medication once the packing device has been removed from your nose. Patient not taking: Reported on 01/13/2015 12/16/14   Carrie Mew, MD      PHYSICAL EXAMINATION:   VITAL SIGNS: Blood pressure 108/54, pulse 97,  temperature 101.1 F (38.4 C), temperature source Oral, resp. rate 25, height 5\' 6"  (1.676 m), weight 111.585 kg (246 lb), SpO2 96 %.  GENERAL:  74 y.o.-year-old patient lying in the bed with no acute distress.  EYES: Pupils equal, round, reactive to light and accommodation. No scleral icterus. Extraocular muscles intact.  HEENT: Head atraumatic, normocephalic. Oropharynx and nasopharynx clear.  NECK:  Supple, no jugular venous distention. No thyroid enlargement, no tenderness.  LUNGS: Normal breath sounds bilaterally, no wheezing, mild crepitation. No use of accessory muscles of respiration.  CARDIOVASCULAR: S1, S2 normal. No murmurs, rubs, or gallops.  ABDOMEN: Soft, nontender, nondistended. Bowel sounds present. No organomegaly or mass.  EXTREMITIES: No pedal edema, cyanosis, or clubbing.  NEUROLOGIC: Cranial nerves II through XII are intact. Muscle strength 4- 5/5 in all extremities. Sensation intact. Gait not checked.  PSYCHIATRIC: The patient is alert , confused SKIN: No obvious rash, lesion, or ulcer.   LABORATORY PANEL:   CBC  Recent Labs Lab 01/26/15 1249  WBC 4.3  HGB 11.6*  HCT 34.2*  PLT 151  MCV 92.4  MCH 31.2  MCHC 33.8  RDW 20.9*  LYMPHSABS 0.4*  MONOABS 0.5  EOSABS 0.0  BASOSABS 0.0   ------------------------------------------------------------------------------------------------------------------  Chemistries   Recent Labs Lab 01/26/15 1249  NA 133*  K 3.6  CL 97*  CO2 27  GLUCOSE 128*  BUN 19  CREATININE 1.08  CALCIUM 8.9  AST 23  ALT 15*  ALKPHOS 72  BILITOT 1.7*   ------------------------------------------------------------------------------------------------------------------ estimated creatinine clearance is 68.2 mL/min (by C-G formula based on Cr of 1.08). ------------------------------------------------------------------------------------------------------------------ No results for input(s): TSH, T4TOTAL, T3FREE, THYROIDAB in the last  72 hours.  Invalid input(s): FREET3   Coagulation profile  Recent Labs Lab 01/26/15 1249  INR 2.39   ------------------------------------------------------------------------------------------------------------------- No results for input(s): DDIMER in the last 72 hours. -------------------------------------------------------------------------------------------------------------------  Cardiac Enzymes  Recent Labs Lab 01/26/15 1439  TROPONINI 0.11*   ------------------------------------------------------------------------------------------------------------------ Invalid input(s): POCBNP  ---------------------------------------------------------------------------------------------------------------  Urinalysis    Component Value Date/Time   COLORURINE AMBER* 01/26/2015 1352   APPEARANCEUR CLEAR* 01/26/2015 1352   LABSPEC 1.024 01/26/2015 1352   PHURINE 5.0 01/26/2015 1352   GLUCOSEU NEGATIVE 01/26/2015 1352   HGBUR 2+* 01/26/2015 [place] 01/26/2015 1352   [place] 01/26/2015 1352   PROTEINUR 100* 01/26/2015 1352   NITRITE NEGATIVE 01/26/2015 1352   LEUKOCYTESUR NEGATIVE 01/26/2015 1352     RADIOLOGY: Dg Chest 2 View  01/26/2015   CLINICAL DATA:  Weakness, chest pain  EXAM: CHEST  2 VIEW  COMPARISON:  11/10/2014  FINDINGS: Cardiomediastinal silhouette is stable. Right IJ Port-A-Cath with tip in distal SVC. No acute infiltrate or pleural effusion. No pulmonary edema. Degenerative changes right shoulder. Mild degenerative changes mid thoracic spine.  IMPRESSION: No active disease. Right  IJ Port-A-Cath in place. Degenerative changes mid thoracic spine and right shoulder.   Electronically Signed   By: Lahoma Crocker M.D.   On: 01/26/2015 13:16   Ct Abdomen Pelvis W Contrast  01/26/2015   CLINICAL DATA:  Fever, right upper quadrant pain. History of esophageal cancer.  EXAM: CT ABDOMEN AND PELVIS WITH CONTRAST  TECHNIQUE: Multidetector CT imaging  of the abdomen and pelvis was performed using the standard protocol following bolus administration of intravenous contrast.  CONTRAST:  148mL OMNIPAQUE IOHEXOL 300 MG/ML  SOLN  COMPARISON:  PET-CT July 23, 2014  FINDINGS: The liver, spleen, pancreas, gallbladder, adrenal glands are normal. There are small cysts within both kidneys. There is no hydronephrosis bilaterally. There is atherosclerosis of the abdominal aorta without aneurysmal dilatation. There is no abdominal lymphadenopathy.  There is no small bowel obstruction. There is diverticulosis of colon without diverticulitis. The appendix is normal. There is a hiatal hernia.  The bladder is decompressed. There is a 1.1 cm stone within the bladder lumen unchanged compared to prior PET-CT. Prostate calcifications are identified. Left inguinal herniation of mesenteric fat is noted. There is no pelvic lymphadenopathy.  There is patchy ground-glass opacity involving the peripheral right lower lobe. There is no pleural effusion. Degenerative joint changes of the spine are noted.  IMPRESSION: Right lower lobe pneumonia.  No acute abnormality identified in the abdomen and pelvis.   Electronically Signed   By: Abelardo Diesel M.D.   On: 01/26/2015 15:56    IMPRESSION AND PLAN:  * Right lower lobe pneumonia We will give IV Rocephin and azithromycin for now  Blood cultures are ordered by ER physician.  This patient have esophageal cancer and presented with pneumonia I would like to have swallowing evaluation also done  * Sepsis  Evident by tachycardia, fever, confusion, borderline low blood pressure.  Secondary to pneumonia. Treat underlying cause  * Atrial fibrillation  Rate is under control, INR is therapeutic, continue Coumadin.  * Esophageal cancer  He follows at [place], advised to continue the same.  Continue pantoprazole and sucralfate.  * Sleep apnea  He does not use his CPAP at home  All the records are reviewed and case discussed with  ED provider. Management plans discussed with the patient, family and they are in agreement.  CODE STATUS: FUll   TOTAL TIME TAKING CARE OF THIS PATIENT: 50 minutes.    Vaughan Basta M.D on 01/26/2015   Between 7am to 6pm - Pager - [phone]  After 6pm go to [email or web address] - password [email or web address] [facility]  [place] [facility]  Office  [phone]  CC: Primary care physician; No PCP Per Patient   Note: This dictation was prepared with Dragon dictation along with smaller phrase technology. Any transcriptional errors that result from this process are unintentional.

## 2015-01-27 ENCOUNTER — Inpatient Hospital Stay: Payer: Medicare Other | Admitting: Oncology

## 2015-01-27 ENCOUNTER — Inpatient Hospital Stay: Payer: Medicare Other

## 2015-01-27 ENCOUNTER — Ambulatory Visit: Payer: Medicare Other | Admitting: Radiation Oncology

## 2015-01-27 LAB — CBC
HEMATOCRIT: 31.7 % — AB (ref 40.0–52.0)
Hemoglobin: 10.9 g/dL — ABNORMAL LOW (ref 13.0–18.0)
MCH: 31.3 pg (ref 26.0–34.0)
MCHC: 34.2 g/dL (ref 32.0–36.0)
MCV: 91.6 fL (ref 80.0–100.0)
PLATELETS: 126 10*3/uL — AB (ref 150–440)
RBC: 3.47 MIL/uL — ABNORMAL LOW (ref 4.40–5.90)
RDW: 20.6 % — AB (ref 11.5–14.5)
WBC: 2.7 10*3/uL — ABNORMAL LOW (ref 3.8–10.6)

## 2015-01-27 LAB — BASIC METABOLIC PANEL
Anion gap: 7 (ref 5–15)
BUN: 20 mg/dL (ref 6–20)
CALCIUM: 8.3 mg/dL — AB (ref 8.9–10.3)
CO2: 26 mmol/L (ref 22–32)
CREATININE: 0.94 mg/dL (ref 0.61–1.24)
Chloride: 102 mmol/L (ref 101–111)
GFR calc Af Amer: >60 mL/min (ref >60)
GLUCOSE: 129 mg/dL — AB (ref 65–99)
POTASSIUM: 3.2 mmol/L — AB (ref 3.5–5.1)
Sodium: 135 mmol/L (ref 135–145)

## 2015-01-27 LAB — PROTIME-INR
INR: 2.89
Prothrombin Time: 30.3 seconds — ABNORMAL HIGH (ref 11.4–15.0)

## 2015-01-27 LAB — MAGNESIUM: MAGNESIUM: 1.7 mg/dL (ref 1.7–2.4)

## 2015-01-27 LAB — URINE CULTURE: CULTURE: NO GROWTH

## 2015-01-27 LAB — TROPONIN I
TROPONIN I: 0.09 ng/mL — AB (ref <0.031)
TROPONIN I: 0.12 ng/mL — AB (ref <0.031)

## 2015-01-27 MED ORDER — SODIUM CHLORIDE 0.9 % IV SOLN
1250.0000 mg | Freq: Once | INTRAVENOUS | Status: AC
  Administered 2015-01-27: 1250 mg via INTRAVENOUS
  Filled 2015-01-27: qty 1250

## 2015-01-27 MED ORDER — PIPERACILLIN-TAZOBACTAM 3.375 G IVPB 30 MIN
3.3750 g | Freq: Once | INTRAVENOUS | Status: AC
  Administered 2015-01-27: 3.375 g via INTRAVENOUS
  Filled 2015-01-27: qty 50

## 2015-01-27 MED ORDER — PIPERACILLIN-TAZOBACTAM 3.375 G IVPB
3.3750 g | Freq: Three times a day (TID) | INTRAVENOUS | Status: DC
  Administered 2015-01-27 – 2015-01-30 (×8): 3.375 g via INTRAVENOUS
  Filled 2015-01-27 (×12): qty 50

## 2015-01-27 MED ORDER — VANCOMYCIN HCL 10 G IV SOLR
1250.0000 mg | Freq: Two times a day (BID) | INTRAVENOUS | Status: DC
  Administered 2015-01-28 – 2015-01-29 (×4): 1250 mg via INTRAVENOUS
  Filled 2015-01-27 (×7): qty 1250

## 2015-01-27 MED ORDER — POTASSIUM CHLORIDE 20 MEQ/15ML (10%) PO SOLN
40.0000 meq | Freq: Once | ORAL | Status: AC
  Administered 2015-01-27: 40 meq via ORAL
  Filled 2015-01-27: qty 30

## 2015-01-27 NOTE — Progress Notes (Addendum)
[facility] [place] NAME: Jeremy Gordon    MR#:  [id number]  DATE OF BIRTH:  04-08-1941  SUBJECTIVE:  CHIEF COMPLAINT:   Chief Complaint  Patient presents with  . Fever  . Dehydration  . Weakness   fever and chills.  REVIEW OF SYSTEMS:  CONSTITUTIONAL: has fever and chills,and generalized  weakness.  EYES: No blurred or double vision.  EARS, NOSE, AND THROAT: No tinnitus or ear pain.  RESPIRATORY:S  cough, shortness of breath,  no wheezing or hemoptysis.  CARDIOVASCULAR: No chest pain, orthopnea, edema.  GASTROINTESTINAL: No nausea, vomiting, diarrhea or abdominal pain.  GENITOURINARY: No dysuria, hematuria.  ENDOCRINE: No polyuria, nocturia,  HEMATOLOGY: No anemia, easy bruising or bleeding SKIN: No rash or lesion. MUSCULOSKELETAL: No joint pain or arthritis.   NEUROLOGIC: No tingling, numbness, weakness.  PSYCHIATRY: No anxiety or depression.   DRUG ALLERGIES:  No Known Allergies  VITALS:  Blood pressure 116/58, pulse 108, temperature 98.1 F (36.7 C), temperature source Oral, resp. rate 17, height 5\' 6"  (1.676 m), weight 111.585 kg (246 lb), SpO2 96 %.  PHYSICAL EXAMINATION:  GENERAL:  74 y.o.-year-old patient lying in the bed with no acute distress. Obese.  EYES: Pupils equal, round, reactive to light and accommodation. No scleral icterus. Extraocular muscles intact.  HEENT: Head atraumatic, normocephalic. Oropharynx and nasopharynx clear.  NECK:  Supple, no jugular venous distention. No thyroid enlargement, no tenderness.  LUNGS: Normal breath sounds bilaterally, no wheezing, rales,rhonchi or crepitation. No use of accessory muscles of respiration.  CARDIOVASCULAR: S1, S2 normal. No murmurs, rubs, or gallops.  ABDOMEN: Soft, nontender, nondistended. Bowel sounds present. No organomegaly or mass.  EXTREMITIES: No pedal edema, cyanosis, or clubbing.  NEUROLOGIC: Cranial nerves II through XII are intact. Muscle  strength 5/5 in all extremities. Sensation intact. Gait not checked.  PSYCHIATRIC: The patient is alert and oriented x 3.  SKIN: No obvious rash, lesion, or ulcer.    LABORATORY PANEL:   CBC  Recent Labs Lab 01/27/15 0546  WBC 2.7*  HGB 10.9*  HCT 31.7*  PLT 126*   ------------------------------------------------------------------------------------------------------------------  Chemistries   Recent Labs Lab 01/26/15 1249 01/27/15 0546 01/27/15 1200  NA 133* 135  --   K 3.6 3.2*  --   CL 97* 102  --   CO2 27 26  --   GLUCOSE 128* 129*  --   BUN 19 20  --   CREATININE 1.08 0.94  --   CALCIUM 8.9 8.3*  --   MG  --   --  1.7  AST 23  --   --   ALT 15*  --   --   ALKPHOS 72  --   --   BILITOT 1.7*  --   --    ------------------------------------------------------------------------------------------------------------------  Cardiac Enzymes  Recent Labs Lab 01/27/15 0546  TROPONINI 0.09*   ------------------------------------------------------------------------------------------------------------------  RADIOLOGY:  Dg Chest 2 View  01/26/2015   CLINICAL DATA:  Weakness, chest pain  EXAM: CHEST  2 VIEW  COMPARISON:  11/10/2014  FINDINGS: Cardiomediastinal silhouette is stable. Right IJ Port-A-Cath with tip in distal SVC. No acute infiltrate or pleural effusion. No pulmonary edema. Degenerative changes right shoulder. Mild degenerative changes mid thoracic spine.  IMPRESSION: No active disease. Right IJ Port-A-Cath in place. Degenerative changes mid thoracic spine and right shoulder.   Electronically Signed   By: Lahoma Crocker M.D.   On: 01/26/2015 13:16   Ct Abdomen Pelvis W Contrast  01/26/2015   CLINICAL DATA:  Fever, right upper quadrant pain. History of esophageal cancer.  EXAM: CT ABDOMEN AND PELVIS WITH CONTRAST  TECHNIQUE: Multidetector CT imaging of the abdomen and pelvis was performed using the standard protocol following bolus administration of intravenous  contrast.  CONTRAST:  180mL OMNIPAQUE IOHEXOL 300 MG/ML  SOLN  COMPARISON:  PET-CT July 23, 2014  FINDINGS: The liver, spleen, pancreas, gallbladder, adrenal glands are normal. There are small cysts within both kidneys. There is no hydronephrosis bilaterally. There is atherosclerosis of the abdominal aorta without aneurysmal dilatation. There is no abdominal lymphadenopathy.  There is no small bowel obstruction. There is diverticulosis of colon without diverticulitis. The appendix is normal. There is a hiatal hernia.  The bladder is decompressed. There is a 1.1 cm stone within the bladder lumen unchanged compared to prior PET-CT. Prostate calcifications are identified. Left inguinal herniation of mesenteric fat is noted. There is no pelvic lymphadenopathy.  There is patchy ground-glass opacity involving the peripheral right lower lobe. There is no pleural effusion. Degenerative joint changes of the spine are noted.  IMPRESSION: Right lower lobe pneumonia.  No acute abnormality identified in the abdomen and pelvis.   Electronically Signed   By: Abelardo Diesel M.D.   On: 01/26/2015 15:56    EKG:   Orders placed or performed during the hospital encounter of 01/26/15  . ED EKG 12-Lead  . ED EKG 12-Lead    ASSESSMENT AND PLAN:   * Right lower lobe pneumonia Start Zosyn and vancomycin due to leukopenia and high fever. Discontinue IV Rocephin and azithromycin. Follow-upBlood.  * Sepsis Evident by tachycardia, fever, confusion, borderline low blood pressure. Secondary to pneumonia. Treat underlying cause  * leukopenia, possibly due to chemotherapy. Oncology consult and Follow-up CBC.  * Atrial fibrillation INR is therapeutic, continue Coumadin and Cardizem.  * Esophageal cancer Continue pantoprazole and sucralfate.Oncology consult. Per swallowing evaluation, no dysphagia but has choking with the high risk of aspiration.   * Sleep apnea He does not use his CPAP at home  *  Hypokalemia.potassium supplement and a follow-up level. Magnesium level is normal.   * Elevated troponin. Possible due to demanding ischemia. Continue Coumadin.   All the records are reviewed and case discussed with Care Management/Social Workerr. Management plans discussed with the patient, sister and they are in agreement. Called his daughter, but nobody answer the phone.  CODE STATUS: Full code  TOTAL TIME TAKING CARE OF THIS PATIENT: 42 minutes.   POSSIBLE D/C IN 3 DAYS, DEPENDING ON CLINICAL CONDITION.   Demetrios Loll M.D on 01/27/2015 at 1:07 PM  Between 7am to 6pm - Pager - [phone]  After 6pm go to [email or web address] - password [email or web address] [facility]  [facility]  Office  [phone]  CC: Primary care physician; No PCP Per Patient

## 2015-01-27 NOTE — Evaluation (Signed)
Clinical/Bedside Swallow Evaluation Patient Details  Name: AQUAN KOPE MRN: [id number] Date of Birth: 1940/06/16  Today's Date: 01/27/2015 Time: SLP Start Time (ACUTE ONLY): 31 SLP Stop Time (ACUTE ONLY): 1130 SLP Time Calculation (min) (ACUTE ONLY): 60 min  Past Medical History:  Past Medical History  Diagnosis Date  . Cancer ([place])   . Hypertension   . Esophageal carcinoma ([place])   . Sleep apnea   . Atrial fibrillation [facility])    Past Surgical History:  Past Surgical History  Procedure Laterality Date  . Peripheral vascular catheterization N/A 11/20/2014    Procedure: Glori Luis Cath Insertion;  Surgeon: Algernon Huxley, MD;  Location: [place] [facility];  Service: Cardiovascular;  Laterality: N/A;   HPI:  Elpidio Thielen is a 74 y.o. male with a known history of esophageal cancer on radiation and chemotherapy, hypertension, sleep apnea, atrial fibrillation- since yesterday his feeling gradually more and more weak and last night he could not even get up to go to the bathroom and family noticed he is having fever. They called Dr. Beverly Gust office to arrange for his next cycle of chemotherapy but he told as he is having fever is better to take him to emergency room rather than bringing him for chemotherapy and they brought him here.   Assessment / Plan / Recommendation Clinical Impression  Pt presented with suspected esophageal dysmotility c/b burping and belching, reported regurgitation (reported by family as occuring after meals).  Pt has undergone 9 treatment sessions for esophageal cancer per family report.  Pt burped/belched after consuming multiple swallows of liquids.  No oral pharyngeal phase deficits observed during these PO trials.  Reviewed aspiration precautions and provided handouts/information to patient and family regarding esophageal dysmotility. NSG updated. Pt. to remain on current diet with strict aspiration precautions.    Aspiration Risk  Mild    Diet Recommendation Age  appropriate regular solids;Thin   Medication Administration: Whole meds with liquid (as able, can take by puree if necessary) Compensations: Minimize environmental distractions;Slow rate;Small sips/bites;Follow solids with liquid    Other  Recommendations Recommended Consults: Consider GI evaluation Oral Care Recommendations: Oral care BID;Patient independent with oral care   Follow Up Recommendations       Frequency and Duration min 3x week  1 week   Pertinent Vitals/Pain None reported    SLP Swallow Goals   See care of plan.   Swallow Study Prior Functional Status    Pt previously consumed a regular diet with thin liquids.    General Date of Onset: 01/26/15 Other Pertinent Information: Olajuwon Fosdick is a 74 y.o. male with a known history of esophageal cancer on radiation and chemotherapy, hypertension, sleep apnea, atrial fibrillation- since yesterday his feeling gradually more and more weak and last night he could not even get up to go to the bathroom and family noticed he is having fever. They called Dr. Beverly Gust office to arrange for his next cycle of chemotherapy but he told as he is having fever is better to take him to emergency room rather than bringing him for chemotherapy and they brought him here. Type of Study: Bedside swallow evaluation Diet Prior to this Study: Regular;Thin liquids Temperature Spikes Noted: No (did have temp spike yesterday (10/10); WNL today (10/11)) Respiratory Status: Room air History of Recent Intubation: No Behavior/Cognition: Alert;Cooperative;Pleasant mood Oral Cavity - Dentition: Adequate natural dentition/normal for age Self-Feeding Abilities: Able to feed self;Needs assist Patient Positioning: Upright in bed Baseline Vocal Quality: Normal (Pt has adaquate vocal intensity)  Volitional Cough: Strong Volitional Swallow: Able to elicit    Oral/Motor/Sensory Function Overall Oral Motor/Sensory Function: Appears within functional limits for  tasks assessed Labial Symmetry: Within Functional Limits Facial Symmetry: Within Functional Limits Mandible: Within Functional Limits (as observed with bolus management)   Ice Chips Ice chips: Not tested   Thin Liquid Thin Liquid: Within functional limits Presentation: Cup Other Comments: Pt consumed ~4-5 ounces of thin liquids with no overt s/s of aspiration noted.  Pt did burp after taking multiple sips of water.  Also noted burping after coffee.  Pt. reports he often burps after consuming liquids.    Nectar Thick Nectar Thick Liquid: Not tested   Honey Thick Honey Thick Liquid: Not tested   Puree Puree: Not tested   Solid   GO    Solid: Within functional limits Presentation: Self Fed Other Comments: Pt consumed 8+ bites of eggs with no immediate/overt s/s of aspiration noted.       Mary Hockey 01/27/2015,12:47 PM

## 2015-01-27 NOTE — Progress Notes (Addendum)
ANTIBIOTIC CONSULT NOTE - INITIAL  Pharmacy Consult for vancomycin Indication: pneumonia  No Known Allergies  Patient Measurements: Height: 5\' 6"  (167.6 cm) Weight: 246 lb (111.585 kg) IBW/kg (Calculated) : 63.8 Adjusted Body Weight: 82.92 kg  Vital Signs: Temp: 98.6 F (37 C) (10/11 1027) Temp Source: Oral (10/11 1027) BP: 114/81 mmHg (10/11 1025) Pulse Rate: 87 (10/11 1025) Intake/Output from previous day:   Intake/Output from this shift:    Labs:  Recent Labs  01/26/15 1249 01/27/15 0546  WBC 4.3 2.7*  HGB 11.6* 10.9*  PLT 151 126*  CREATININE 1.08 0.94   Estimated Creatinine Clearance: 78.4 mL/min (by C-G formula based on Cr of 0.94). No results for input(s): VANCOTROUGH, VANCOPEAK, VANCORANDOM, GENTTROUGH, GENTPEAK, GENTRANDOM, TOBRATROUGH, TOBRAPEAK, TOBRARND, AMIKACINPEAK, AMIKACINTROU, AMIKACIN in the last 72 hours.   Microbiology: No results found for this or any previous visit (from the past 720 hour(s)).  Medical History: Past Medical History  Diagnosis Date  . Cancer ([place])   . Hypertension   . Esophageal carcinoma ([place])   . Sleep apnea   . Atrial fibrillation ([facility])     Medications:  Anti-infectives    Start     Dose/Rate Route Frequency Ordered Stop   01/27/15 2030  piperacillin-tazobactam (ZOSYN) IVPB 3.375 g     3.375 g 12.5 mL/hr over 240 Minutes Intravenous 3 times per day 01/27/15 1121     01/27/15 1830  vancomycin (VANCOCIN) 1,250 mg in sodium chloride 0.9 % 250 mL IVPB     1,250 mg 166.7 mL/hr over 90 Minutes Intravenous Every 12 hours 01/27/15 1136     01/27/15 1300  vancomycin (VANCOCIN) 1,250 mg in sodium chloride 0.9 % 250 mL IVPB     1,250 mg 166.7 mL/hr over 90 Minutes Intravenous  Once 01/27/15 1134     01/27/15 1230  piperacillin-tazobactam (ZOSYN) IVPB 3.375 g     3.375 g 100 mL/hr over 30 Minutes Intravenous  Once 01/27/15 1121     01/26/15 2300  piperacillin-tazobactam (ZOSYN) IVPB 3.375 g  Status:  Discontinued     3.375 g 12.5 mL/hr over 240 Minutes Intravenous 3 times per day 01/26/15 1758 01/26/15 1759   01/26/15 2130  vancomycin (VANCOCIN) IVPB 1000 mg/200 mL premix  Status:  Discontinued     1,000 mg 200 mL/hr over 60 Minutes Intravenous Every 12 hours 01/26/15 1756 01/26/15 1758   01/26/15 1730  cefTRIAXone (ROCEPHIN) 1 g in dextrose 5 % 50 mL IVPB  Status:  Discontinued     1 g 100 mL/hr over 30 Minutes Intravenous Every 24 hours 01/26/15 1715 01/27/15 1119   01/26/15 1730  azithromycin (ZITHROMAX) 250 mg in dextrose 5 % 125 mL IVPB  Status:  Discontinued     250 mg 125 mL/hr over 60 Minutes Intravenous Every 24 hours 01/26/15 1715 01/27/15 1119   01/26/15 1430  piperacillin-tazobactam (ZOSYN) IVPB 3.375 g     3.375 g 100 mL/hr over 30 Minutes Intravenous  Once 01/26/15 1423 01/26/15 1513   01/26/15 1430  vancomycin (VANCOCIN) IVPB 1000 mg/200 mL premix     1,000 mg 200 mL/hr over 60 Minutes Intravenous  Once 01/26/15 1423 01/26/15 1815     Assessment: Pharmacy consulted to dose vancomycin for this 74 year old male presenting to ED on 10/10 complaining of fever and weakness. Patient received a dose of vanc + pip/tazo in ED and was started on ceftriaxone and azithromycin on admission.   Patient has esophageal cancer and received FOLFIRI on 10/8, WBC this  AM was 2.7 with a max temperature overnight of 103. Antibiotics were changed to vancomycin and piperacillin/tazobactam. Blood and urine culture pending.  Kinetics:  CrCl (adjusted body weight) = 78 mL/min ke = 0.069 Half-life = 10 hours    Vd=58L Cmin (calculated) = ~18  Goal of Therapy:  Vancomycin trough level 15-20 mcg/ml  Plan:  Piperacillin/tazobactam 3.375g IV EI q8h  Vancomycin 1250 mg x1 dose followed by vancomycin 1250 mg IV q12h (stacked dose starting 6 hours after initial dose) Trough ordered for prior to 4th dose  Pharmacy will continue to monitor renal function and vancomycin levels. Thank you for the consult.  [name]  Paris Hohn 01/27/2015,11:40 AM   Addendum: Stacked vancomycin dose was not documented as being given last night, will retime vanc trough for 10/13 at 17:30.

## 2015-01-28 ENCOUNTER — Encounter: Payer: Self-pay | Admitting: *Deleted

## 2015-01-28 ENCOUNTER — Other Ambulatory Visit: Payer: Self-pay | Admitting: Family Medicine

## 2015-01-28 ENCOUNTER — Inpatient Hospital Stay: Payer: Medicare Other

## 2015-01-28 DIAGNOSIS — R634 Abnormal weight loss: Secondary | ICD-10-CM

## 2015-01-28 DIAGNOSIS — D696 Thrombocytopenia, unspecified: Secondary | ICD-10-CM

## 2015-01-28 DIAGNOSIS — G4730 Sleep apnea, unspecified: Secondary | ICD-10-CM

## 2015-01-28 DIAGNOSIS — Z7901 Long term (current) use of anticoagulants: Secondary | ICD-10-CM

## 2015-01-28 DIAGNOSIS — R1310 Dysphagia, unspecified: Secondary | ICD-10-CM

## 2015-01-28 DIAGNOSIS — Z79899 Other long term (current) drug therapy: Secondary | ICD-10-CM

## 2015-01-28 DIAGNOSIS — J690 Pneumonitis due to inhalation of food and vomit: Secondary | ICD-10-CM

## 2015-01-28 DIAGNOSIS — F17210 Nicotine dependence, cigarettes, uncomplicated: Secondary | ICD-10-CM

## 2015-01-28 LAB — BASIC METABOLIC PANEL
ANION GAP: 6 (ref 5–15)
BUN: 20 mg/dL (ref 6–20)
CHLORIDE: 105 mmol/L (ref 101–111)
CO2: 25 mmol/L (ref 22–32)
Calcium: 8.3 mg/dL — ABNORMAL LOW (ref 8.9–10.3)
Creatinine, Ser: 1 mg/dL (ref 0.61–1.24)
Glucose, Bld: 120 mg/dL — ABNORMAL HIGH (ref 65–99)
POTASSIUM: 3.2 mmol/L — AB (ref 3.5–5.1)
SODIUM: 136 mmol/L (ref 135–145)

## 2015-01-28 LAB — CBC
HCT: 31.9 % — ABNORMAL LOW (ref 40.0–52.0)
HEMOGLOBIN: 11 g/dL — AB (ref 13.0–18.0)
MCH: 31.6 pg (ref 26.0–34.0)
MCHC: 34.6 g/dL (ref 32.0–36.0)
MCV: 91.2 fL (ref 80.0–100.0)
PLATELETS: 136 10*3/uL — AB (ref 150–440)
RBC: 3.49 MIL/uL — AB (ref 4.40–5.90)
RDW: 20.7 % — ABNORMAL HIGH (ref 11.5–14.5)
WBC: 1.9 10*3/uL — AB (ref 3.8–10.6)

## 2015-01-28 LAB — PROTIME-INR
INR: 4.55 — AB
PROTHROMBIN TIME: 43 s — AB (ref 11.4–15.0)

## 2015-01-28 LAB — GLUCOSE, CAPILLARY: Glucose-Capillary: 136 mg/dL — ABNORMAL HIGH (ref 65–99)

## 2015-01-28 LAB — MRSA PCR SCREENING: MRSA by PCR: POSITIVE — AB

## 2015-01-28 MED ORDER — METOPROLOL TARTRATE 1 MG/ML IV SOLN
INTRAVENOUS | Status: AC
  Administered 2015-01-28: 5 mg
  Filled 2015-01-28: qty 5

## 2015-01-28 MED ORDER — DILTIAZEM HCL 100 MG IV SOLR
5.0000 mg/h | INTRAVENOUS | Status: DC
  Administered 2015-01-28: 15 mg/h via INTRAVENOUS
  Administered 2015-01-29 – 2015-01-30 (×3): 10 mg/h via INTRAVENOUS
  Administered 2015-01-30: 5 mg/h via INTRAVENOUS
  Filled 2015-01-28 (×8): qty 100

## 2015-01-28 MED ORDER — CHLORHEXIDINE GLUCONATE CLOTH 2 % EX PADS
6.0000 | MEDICATED_PAD | Freq: Every day | CUTANEOUS | Status: DC
  Administered 2015-01-29 – 2015-02-02 (×3): 6 via TOPICAL

## 2015-01-28 MED ORDER — NITROGLYCERIN 0.4 MG SL SUBL
SUBLINGUAL_TABLET | SUBLINGUAL | Status: AC
  Administered 2015-01-28: 19:00:00
  Filled 2015-01-28: qty 1

## 2015-01-28 MED ORDER — POTASSIUM CHLORIDE CRYS ER 20 MEQ PO TBCR
40.0000 meq | EXTENDED_RELEASE_TABLET | Freq: Once | ORAL | Status: AC
  Administered 2015-01-28: 40 meq via ORAL
  Filled 2015-01-28: qty 2

## 2015-01-28 MED ORDER — MUPIROCIN 2 % EX OINT
1.0000 "application " | TOPICAL_OINTMENT | Freq: Two times a day (BID) | CUTANEOUS | Status: DC
  Administered 2015-01-29 – 2015-02-02 (×9): 1 via NASAL
  Filled 2015-01-28 (×2): qty 22

## 2015-01-28 MED ORDER — FUROSEMIDE 10 MG/ML IJ SOLN
INTRAMUSCULAR | Status: AC
  Administered 2015-01-28: 40 mg
  Filled 2015-01-28: qty 4

## 2015-01-28 NOTE — Plan of Care (Signed)
Problem: Phase I Progression Outcomes Goal: OOB as tolerated unless otherwise ordered Outcome: Not Progressing Patient is unable to sit on side of bed without continuous assistance

## 2015-01-28 NOTE — Progress Notes (Signed)
While RN to RN report, telemetry clerk advised staff to assess patient in 63. Upon arrival to patient's room, patient half way down the length of the bed, unresponsive, apneic and loss of urine noted. Facial color was pink and body temp warm with cool diaphoresis. Code blue called. No loss of pulse noted. Staff assisted patient up in bed and CPR was initiated. Within brief moments of resuscitation, patient responded with eyes open, breathing on his own and and pulse remained present. Rapid, bounding pulse noted. Per telemetry box on patient, cardiac rhythm was VTACH HR 180. Assistive staff arrived to patient's room, including Cardiologist Dr. Rockey Situ. Cardiac rhythm conversion noted from Vtach to Afib RVR. NRB applied and Medications administered to help control patient's HR. ICU Bed assignment obtained from nursing supervisor. Prime Doc on-call arrived at bedside, Dr. Volanda Napoleon. She spoke with Dr. Rockey Situ about patient's plan of care. Volanda Napoleon spoke with patient's son and DIL about situation and the need to transfer patient to ICU for continuous IV medication and closer monitoring.Transferred patient to ICU-10. Report given to ICU RN at bedside.

## 2015-01-28 NOTE — Progress Notes (Signed)
Patient has rested quietly today. No complaints of pain and no signs of discomfort or distress noted. Nursing staff will continue to monitor. Masami Plata A Soleil Mas, RN 

## 2015-01-28 NOTE — Progress Notes (Signed)
Speech Therapy Note:  Consulted NSG and reviewed chart.  ST stopped in to check on Pt just after he had finished eating and found him laying flat on the bed coughing with the lunch tray still pulled in front of him.  ST raised the bed back up to elevate patient to ensure more upright position.  Pt has known espophageal dysmotility.  Educated Pt that he should sit up for at least 30 minutes after meals and reminded him of aspiration precautions outlined to him yesterday (on 10/11).  Patient does not report any concerns with his swallow and stated "it all swallowed down fine."  NSG updated.  St will f/u during a meal tomorrow.

## 2015-01-28 NOTE — Progress Notes (Signed)
Called into room after code blue was called on 2A Tele showing sustained VT Brief loss of consciousness, no compression or shock delivered He converted on his own without intervention to atrial fib with RVR, rate 180s Review of tele shows worsening rate (a fib) through the coarse of the day  Metoprolol 5 mg IV puch given NTG SL given for SBP >180 Lasix 40 mg IV x 1 given for SOB, crackles consistent with CHF  Sx improved, and transferred to ICU for diltiazem infusion Full consult to follow in the AM

## 2015-01-28 NOTE — Progress Notes (Signed)
eLink Physician-Brief Progress Note Patient Name: Jeremy Gordon DOB: 26-Dec-1940 MRN: [id number]   Date of Service  01/28/2015  HPI/Events of Note  Pt with esopahgeal cancer. Transferred to ICU after a brief code with Afib, RVR.  eICU Interventions  Pt is stable. HR is improving on cardizem drip. Will order BMP and lytes- to be repleted as needed.     Intervention Category Evaluation Type: New Patient Evaluation  [name] 01/28/2015, 9:29 PM

## 2015-01-28 NOTE — Progress Notes (Addendum)
[facility] NAME: Jeremy Gordon    MR#:  [id number]  DATE OF BIRTH:  [birth year]  SUBJECTIVE:  CHIEF COMPLAINT:   Chief Complaint  Patient presents with  . Fever  . Dehydration  . Weakness   fever 102 this am, mil cough and SOB.  REVIEW OF SYSTEMS:  CONSTITUTIONAL: has fever and chills,and generalized  weakness.  EYES: No blurred or double vision.  EARS, NOSE, AND THROAT: No tinnitus or ear pain.  RESPIRATORY:S  cough, shortness of breath,  no wheezing or hemoptysis.  CARDIOVASCULAR: No chest pain, orthopnea, edema.  GASTROINTESTINAL: No nausea, vomiting, diarrhea or abdominal pain.  GENITOURINARY: No dysuria, hematuria.  ENDOCRINE: No polyuria, nocturia,  HEMATOLOGY: No anemia, easy bruising or bleeding SKIN: No rash or lesion. MUSCULOSKELETAL: No joint pain or arthritis.   NEUROLOGIC: No tingling, numbness, weakness.  PSYCHIATRY: No anxiety or depression.   DRUG ALLERGIES:  No Known Allergies  VITALS:  Blood pressure 131/79, pulse 91, temperature 99.1 F (37.3 C), temperature source Oral, resp. rate 18, height 5\' 6"  (1.676 m), weight 111.585 kg (246 lb), SpO2 96 %.  PHYSICAL EXAMINATION:  GENERAL:  74 y.o.-year-old patient lying in the bed with no acute distress. Obese.  EYES: Pupils equal, round, reactive to light and accommodation. No scleral icterus. Extraocular muscles intact.  HEENT: Head atraumatic, normocephalic. Oropharynx and nasopharynx clear.  NECK:  Supple, no jugular venous distention. No thyroid enlargement, no tenderness.  LUNGS: Normal breath sounds bilaterally, no wheezing, but has mild crackles. No use of accessory muscles of respiration.  CARDIOVASCULAR: S1, S2 normal. No murmurs, rubs, or gallops.  ABDOMEN: Soft, nontender, nondistended. Bowel sounds present. No organomegaly or mass.  EXTREMITIES: No pedal edema, cyanosis, or clubbing.  NEUROLOGIC: Cranial nerves II through XII are intact.  Muscle strength 4/5 in all extremities. Sensation intact. Gait not checked.  PSYCHIATRIC: The patient is alert and oriented x 3.  SKIN: No obvious rash, lesion, or ulcer.    LABORATORY PANEL:   CBC  Recent Labs Lab 01/28/15 0443  WBC 1.9*  HGB 11.0*  HCT 31.9*  PLT 136*   ------------------------------------------------------------------------------------------------------------------  Chemistries   Recent Labs Lab 01/26/15 1249  01/27/15 1200 01/28/15 0443  NA 133*  < >  --  136  K 3.6  < >  --  3.2*  CL 97*  < >  --  105  CO2 27  < >  --  25  GLUCOSE 128*  < >  --  120*  BUN 19  < >  --  20  CREATININE 1.08  < >  --  1.00  CALCIUM 8.9  < >  --  8.3*  MG  --   --  1.7  --   AST 23  --   --   --   ALT 15*  --   --   --   ALKPHOS 72  --   --   --   BILITOT 1.7*  --   --   --   < > = values in this interval not displayed. ------------------------------------------------------------------------------------------------------------------  Cardiac Enzymes  Recent Labs Lab 01/27/15 0546  TROPONINI 0.09*   ------------------------------------------------------------------------------------------------------------------  RADIOLOGY:  Ct Abdomen Pelvis W Contrast  01/26/2015  CLINICAL DATA:  Fever, right upper quadrant pain. History of esophageal cancer. EXAM: CT ABDOMEN AND PELVIS WITH CONTRAST TECHNIQUE: Multidetector CT imaging of the abdomen and pelvis was performed using the standard protocol following bolus administration  of intravenous contrast. CONTRAST:  155mL OMNIPAQUE IOHEXOL 300 MG/ML  SOLN COMPARISON:  PET-CT July 23, 2014 FINDINGS: The liver, spleen, pancreas, gallbladder, adrenal glands are normal. There are small cysts within both kidneys. There is no hydronephrosis bilaterally. There is atherosclerosis of the abdominal aorta without aneurysmal dilatation. There is no abdominal lymphadenopathy. There is no small bowel obstruction. There is diverticulosis of  colon without diverticulitis. The appendix is normal. There is a hiatal hernia. The bladder is decompressed. There is a 1.1 cm stone within the bladder lumen unchanged compared to prior PET-CT. Prostate calcifications are identified. Left inguinal herniation of mesenteric fat is noted. There is no pelvic lymphadenopathy. There is patchy ground-glass opacity involving the peripheral right lower lobe. There is no pleural effusion. Degenerative joint changes of the spine are noted. IMPRESSION: Right lower lobe pneumonia. No acute abnormality identified in the abdomen and pelvis. Electronically Signed   By: Abelardo Diesel M.D.   On: 01/26/2015 15:56    EKG:   Orders placed or performed during the hospital encounter of 01/26/15  . ED EKG 12-Lead  . ED EKG 12-Lead    ASSESSMENT AND PLAN:   * Right lower lobe pneumonia with sepsis. Continue Zosyn and vancomycin due to leukopenia and high fever. Discontinued IV Rocephin and azithromycin. Blood culture is negative so far.  * leukopenia with pancytopenia, possibly due to chemotherapy. WBC decreased to 1.9 today. Follow-up Oncology consult and Follow-up CBC. Leukopenia protective isolation.  * Atrial fibrillation. INR is suprapeutic, discontinue Coumadin and continue Cardizem. Follow-up INR.  * Esophageal cancer Continue pantoprazole and sucralfate.Oncology consult. Per swallowing evaluation, no dysphagia but has choking with the high risk of aspiration. Aspiration precaution.  * Sleep apnea He does not use his CPAP at home  * Hypokalemia.potassium supplement and a follow-up level. Magnesium level is normal.   * Elevated troponin. Possible due to demanding ischemia.   All the records are reviewed and case discussed with Care Management/Social Workerr. Management plans discussed with the patient, sister and they are in agreement. Called his daughter again today, but nobody answer the phone.  CODE STATUS: Full code  TOTAL TIME TAKING CARE  OF THIS PATIENT: 42 minutes.   POSSIBLE D/C IN 3 DAYS, DEPENDING ON CLINICAL CONDITION.   Demetrios Loll M.D on 01/28/2015 at 2:17 PM  Between 7am to 6pm - Pager - [phone]  After 6pm go to [email or web address] - password [email or web address] [facility]  [facility]  Office  [phone]  CC: Primary care physician; No PCP Per Patient

## 2015-01-28 NOTE — Evaluation (Signed)
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 He was evaluated in the emergency room for increasing weakness and fatigue, confusion, and fevers. He also had been complaining of shortness of breath.   Clinical Impression  Upon evaluation, pt is having a difficult time breathing. Pt is extremely HOH and also is hard to understand which makes communicating with patient difficult. Pt is mod assist with all bed mobility. Attempted to let pt try to transfer supine <>sitting on EOB with use of bed rails but pt did not possess the upper body and core strength to be able to complete transfer; PT assist needed for control of trunk and shoulders to get pt into sitting. Once in sitting; pt was unsteady and tended to lose balance posteriorly and to the R when PT assist was taken away. Sitting on EOB with both feet on ground was eventually able to be maintained by pt without BUE support or PT assist. Pt gets easily frustrated with inability to perform bed mobility and tends to make a lot of rushed/jerky movements that are inefficient for performing transfers. Pt needs to be educated on proper sequencing for efficient transfers. Pt declined any further performance of mobility today after growing increasingly SOB and experiencing a coughing fit. Pt would benefit from further skilled acute PT services in order to increase ability to perform functional mobility and increase endurance.     Follow Up Recommendations SNF    Equipment Recommendations       Recommendations for Other Services       Precautions / Restrictions Precautions Precautions: Fall Restrictions Weight Bearing Restrictions: No      Mobility  Bed Mobility Overal bed mobility: Needs Assistance Bed Mobility: Supine to Sit      Supine to sit: Mod assist     General bed mobility comments: mod assist to for control of shoulders/trunk to sitting on EOB; pt did not possess strenght in upper body to pull himself up to sitting after several attempts. Pt struggles and needs to be reminded to calm down due to growing frustration with bed mobility.   Transfers Overall transfer level:  (transfers not performed today due to increased SOB and coughing fit with basic bed mobility. Pt declined any further activity.)                  Ambulation/Gait Ambulation/Gait assistance:  (see above)              Stairs            Wheelchair Mobility    Modified Rankin (Stroke Patients Only)       Balance Overall balance assessment: Needs assistance Sitting-balance support: No upper extremity supported;Feet supported;Bilateral upper extremity supported Sitting balance-Leahy Scale: Poor Sitting balance - Comments: Pt able to maintain sitting on EOB position once balance has been established. Pt has difficulty attaining sitting position and would frequently fall posteriorly and to the R without PT assist initially.  Postural control: Posterior lean;Right lateral lean Standing balance support:  (not assessed, pt declined activity outside of sitting on EOB due to SOB)                                 Pertinent Vitals/Pain Pain Assessment: No/denies pain    Home Living Family/patient  expects to be discharged to:: Private residence Living Arrangements: Spouse/significant other   Type of Home: House Home Access: Level entry     [place]: [place]: Environmental consultant - 2 wheels;Cane - single point Additional Comments: pt uses walker for community ambulation; uses cane inside the house    Prior Function Level of Independence: Independent with assistive device(s)               Hand Dominance        Extremity/Trunk Assessment   Upper Extremity Assessment: Overall WFL for tasks  assessed           Lower Extremity Assessment: Generalized weakness (BLE gross strength 3/5)         Communication   Communication: HOH;Expressive difficulties (Pt is extremely HOH and is difficult to understand when speaking)  Cognition Arousal/Alertness: Awake/alert Behavior During Therapy: WFL for tasks assessed/performed Overall Cognitive Status: Within Functional Limits for tasks assessed                      General Comments      Exercises        Assessment/Plan    PT Assessment Patient needs continued PT services  PT Diagnosis Generalized weakness   PT Problem List Decreased strength;Decreased activity tolerance;Decreased balance;Decreased mobility;Cardiopulmonary status limiting activity  PT Treatment Interventions Functional mobility training;Therapeutic activities;Therapeutic exercise;Balance training;Neuromuscular re-education   PT Goals (Current goals can be found in the Care Plan section) Acute Rehab PT Goals Patient Stated Goal: to go home PT Goal Formulation: With patient Time For Goal Achievement: 02/11/15 Potential to Achieve Goals: Fair    Frequency Min 2X/week   Barriers to discharge        Co-evaluation               End of Session   Activity Tolerance: Patient limited by fatigue Patient left: in bed;with call bell/phone within reach;with bed alarm set           Time: 1420-1436 PT Time Calculation (min) (ACUTE ONLY): 16 min   Charges:         PT G CodesMilon Score 01/28/2015, 3:02 PM

## 2015-01-28 NOTE — Progress Notes (Signed)
MD Bridgett Larsson notified of INR 4.55. Will monitor.

## 2015-01-28 NOTE — Care Management Important Message (Signed)
Important Message  Patient Details  Name: Jeremy Gordon MRN: [id number] Date of Birth: [birth year]   Medicare Important Message Given:  Yes-second notification given    Katrina Stack, RN 01/28/2015, 3:59 PM

## 2015-01-28 NOTE — Consult Note (Signed)
[place]  [phone] [phone]  ID: [name] OB: 11-Sep-1940  MR#: [id number]  SWF#:[id number]  Patient Care Team: No Pcp Per Patient as PCP - General (General Practice) No Pcp Per Patient (General Practice)  CHIEF COMPLAINT:  Chief Complaint  Patient presents with  . Fever  . Dehydration  . Weakness    INTERVAL HISTORY: Patient is a 74 year old male with known recurrent esophageal cancer currently undergoing chemotherapy. He was evaluated in the emergency room for increasing weakness and fatigue, confusion, and fevers. He also had been complaining of shortness of breath. Currently, patient still feels terrible, but does not offer any specific complaints. He has no neurologic complaints. He denies any current fever. He continues to have occasional difficulty swallowing. He does not complain of shortness of breath. He denies any nausea, vomiting, constipation, or diarrhea. He has no urinary complaints. Patient offers no further specific complaints.  REVIEW OF SYSTEMS:   Review of Systems  Constitutional: Positive for fever and malaise/fatigue.  Respiratory: Positive for cough and shortness of breath.   Cardiovascular: Negative.   Genitourinary: Negative.   Neurological: Positive for weakness.    As per HPI. Otherwise, a complete review of systems is negatve.  PAST MEDICAL HISTORY: Past Medical History  Diagnosis Date  . Cancer ([place])   . Hypertension   . Esophageal carcinoma ([place])   . Sleep apnea   . Atrial fibrillation ([place])     PAST SURGICAL HISTORY: Past Surgical History  Procedure Laterality Date  . Peripheral vascular catheterization N/A 11/20/2014    Procedure: Glori Luis Cath Insertion;  Surgeon: Algernon Huxley, MD;  Location: [facility];  Service: Cardiovascular;  Laterality: N/A;    FAMILY HISTORY Family History  Problem Relation Age of Onset  . Hypertension Sister   . Hypertension Brother        ADVANCED  DIRECTIVES:    HEALTH MAINTENANCE: Social History  Substance Use Topics  . Smoking status: Former Smoker -- 32 years  . Smokeless tobacco: Current User    Types: Snuff  . Alcohol Use: No     Colonoscopy:  PAP:  Bone density:  Lipid panel:  No Known Allergies  Current Facility-Administered Medications  Medication Dose Route Frequency Provider Last Rate Last Dose  . acetaminophen (TYLENOL) suppository 650 mg  650 mg Rectal Q4H PRN Lytle Butte, MD   650 mg at 01/26/15 2127  . acetaminophen (TYLENOL) tablet 650 mg  650 mg Oral Q6H PRN Vaughan Basta, MD   650 mg at 01/28/15 0415  . albuterol (PROVENTIL) (2.5 MG/3ML) 0.083% nebulizer solution 3 mL  3 mL Inhalation Q4H PRN Vaughan Basta, MD      . diltiazem (DILACOR XR) 24 hr capsule 120 mg  120 mg Oral Daily Vaughan Basta, MD   120 mg at 01/28/15 0951  . finasteride (PROSCAR) tablet 5 mg  5 mg Oral Daily Vaughan Basta, MD   5 mg at 01/28/15 0951  . gabapentin (NEURONTIN) capsule 300 mg  300 mg Oral BID Vaughan Basta, MD   300 mg at 01/28/15 1000  . HYDROcodone-acetaminophen (NORCO) 10-325 MG per tablet 1 tablet  1 tablet Oral Q8H PRN Vaughan Basta, MD   1 tablet at 01/27/15 1031  . magic mouthwash  5-10 mL Oral QID Vaughan Basta, MD   10 mL at 01/28/15 0951  . pantoprazole (PROTONIX) EC tablet 40 mg  40 mg Oral Daily Vaughan Basta, MD   40 mg at 01/28/15 0951  .  piperacillin-tazobactam (ZOSYN) IVPB 3.375 g  3.375 g Intravenous 3 times per day Demetrios Loll, MD   3.375 g at 01/28/15 1200  . sucralfate (CARAFATE) 1 GM/10ML suspension 1 g  1 g Oral BID Vaughan Basta, MD   1 g at 01/28/15 0951  . vancomycin (VANCOCIN) 1,250 mg in sodium chloride 0.9 % 250 mL IVPB  1,250 mg Intravenous Q12H Lenis Noon, RPH   1,250 mg at 01/28/15 0617  . Warfarin - Physician Dosing Inpatient   Does not apply [id number] Vaughan Basta, MD       Facility-Administered Medications Ordered  in Other Encounters  Medication Dose Route Frequency Provider Last Rate Last Dose  . 0.9 %  sodium chloride infusion   Intravenous Once Lloyd Huger, MD      . sodium chloride 0.9 % injection 10 mL  10 mL Intravenous PRN Lloyd Huger, MD   10 mL at 12/23/14 0945  . sodium chloride 0.9 % injection 10 mL  10 mL Intracatheter PRN Lloyd Huger, MD   10 mL at 12/30/14 0947  . sodium chloride 0.9 % injection 10 mL  10 mL Intravenous PRN Lloyd Huger, MD   10 mL at 01/15/15 1512    OBJECTIVE: Filed Vitals:   01/28/15 1136  BP: 131/79  Pulse: 91  Temp: 99.1 F (37.3 C)  Resp: 18     Body mass index is 39.72 kg/(m^2).    ECOG FS:2 - Symptomatic, <50% confined to bed  General: Ill-appearing, no acute distress. Eyes: Pink conjunctiva, anicteric sclera. HEENT: Normocephalic, moist mucous membranes, clear oropharnyx. Lungs: Clear to auscultation bilaterally. Heart: Regular rate and rhythm. No rubs, murmurs, or gallops. Abdomen: Soft, nontender, nondistended. No organomegaly noted, normoactive bowel sounds. Musculoskeletal: No edema, cyanosis, or clubbing. Neuro: Alert, answering all questions appropriately. Cranial nerves grossly intact. Skin: No rashes or petechiae noted. Psych: Normal affect. Lymphatics: No cervical, calvicular, axillary or inguinal LAD.   LAB RESULTS:  Lab Results  Component Value Date   NA 136 01/28/2015   K 3.2* 01/28/2015   CL 105 01/28/2015   CO2 25 01/28/2015   GLUCOSE 120* 01/28/2015   BUN 20 01/28/2015   CREATININE 1.00 01/28/2015   CALCIUM 8.3* 01/28/2015   PROT 6.6 01/26/2015   ALBUMIN 3.4* 01/26/2015   AST 23 01/26/2015   ALT 15* 01/26/2015   ALKPHOS 72 01/26/2015   BILITOT 1.7* 01/26/2015   GFRNONAA >60 01/28/2015   GFRAA >60 01/28/2015    Lab Results  Component Value Date   WBC 1.9* 01/28/2015   NEUTROABS 3.4 01/26/2015   HGB 11.0* 01/28/2015   HCT 31.9* 01/28/2015   MCV 91.2 01/28/2015   PLT 136* 01/28/2015      STUDIES: Dg Chest 2 View  01/26/2015  CLINICAL DATA:  Weakness, chest pain EXAM: CHEST  2 VIEW COMPARISON:  11/10/2014 FINDINGS: Cardiomediastinal silhouette is stable. Right IJ Port-A-Cath with tip in distal SVC. No acute infiltrate or pleural effusion. No pulmonary edema. Degenerative changes right shoulder. Mild degenerative changes mid thoracic spine. IMPRESSION: No active disease. Right IJ Port-A-Cath in place. Degenerative changes mid thoracic spine and right shoulder. Electronically Signed   By: Lahoma Crocker M.D.   On: 01/26/2015 13:16   Ct Abdomen Pelvis W Contrast  01/26/2015  CLINICAL DATA:  Fever, right upper quadrant pain. History of esophageal cancer. EXAM: CT ABDOMEN AND PELVIS WITH CONTRAST TECHNIQUE: Multidetector CT imaging of the abdomen and pelvis was performed using the standard protocol following bolus administration  of intravenous contrast. CONTRAST:  171mL OMNIPAQUE IOHEXOL 300 MG/ML  SOLN COMPARISON:  PET-CT July 23, 2014 FINDINGS: The liver, spleen, pancreas, gallbladder, adrenal glands are normal. There are small cysts within both kidneys. There is no hydronephrosis bilaterally. There is atherosclerosis of the abdominal aorta without aneurysmal dilatation. There is no abdominal lymphadenopathy. There is no small bowel obstruction. There is diverticulosis of colon without diverticulitis. The appendix is normal. There is a hiatal hernia. The bladder is decompressed. There is a 1.1 cm stone within the bladder lumen unchanged compared to prior PET-CT. Prostate calcifications are identified. Left inguinal herniation of mesenteric fat is noted. There is no pelvic lymphadenopathy. There is patchy ground-glass opacity involving the peripheral right lower lobe. There is no pleural effusion. Degenerative joint changes of the spine are noted. IMPRESSION: Right lower lobe pneumonia. No acute abnormality identified in the abdomen and pelvis. Electronically Signed   By: Abelardo Diesel M.D.    On: 01/26/2015 15:56    ASSESSMENT: Recurrent adenocarcinoma the esophagus. Community acquired pneumonia.  PLAN:    1. Esophageal cancer: CT reviewed independently. Patient also had recent evaluation at [facility] documenting his persistent or recurrent disease. Patient received cycle 4 of FOLFIRI chemotherapy on January 13, 2015. His next treatment was scheduled for yesterday, but will delay at least 1-2 weeks until his acute symptoms resolve. 2. Pneumonia: Agree with current antibiotics. 3. Pancytopenia: Likely secondary to chemotherapy, continue to monitor CBC. No intervention is needed at this time.  4. Atrial fibrillation: Continue Coumadin as prescribed. INR is supratherapeutic today. Hospitalists aware.  5. Difficulty swallowing: Secondary to persistent malignancy. Patient has refused PEG tube placement. 6. Disposition: Patient will likely remain in the hospital for several more days until his acute symptoms resolve. He can follow-up in the [place] approximately one week after discharge for laboratory work and consideration of reinitiation of treatment.  Appreciate consult, call with questions.   Lloyd Huger, MD   01/28/2015 2:18 PM

## 2015-01-28 NOTE — Progress Notes (Signed)
CODE BLUE called for V. tach arrest. Dr. Candis Musa managed the code. Patient converted to atrial fibrillation with RVR spontaneously. IV pushes of metoprolol have helped to reduce rate from 180s to 130s. IV Lasix given to improve aeration. Patient being transferred to ICU for atrial fibrillation with RVR initiate diltiazem drip. We'll give additional dose of 5 mg metoprolol IV now. Cycle cardiac enzymes. Obtain 2-D echocardiogram. Repeat chest x-ray. Consult cardiology, Dr. Candis Musa. Family updated his son [name] would like to be the primary contact his phone number is  [phone].  Physical exam Gen. patient anxious, facemask Lungs: Diffuse crackles and wheezes, short shallow respirations Cardiovascular: Tachycardic, unable to appreciate murmurs rubs or gallops due to speed Abdomen: Bowel sounds positive, nontender, no guarding no rebound, distended Extremities: No edema, peripheral pulses 1+ Neurologic: Patient is alert, oriented 3, anxious

## 2015-01-29 ENCOUNTER — Inpatient Hospital Stay (HOSPITAL_COMMUNITY)
Admit: 2015-01-29 | Discharge: 2015-01-29 | Disposition: A | Discharge status: Home / Self Care | Payer: Medicare Other | Attending: Internal Medicine | Admitting: Internal Medicine

## 2015-01-29 ENCOUNTER — Encounter: Payer: Self-pay | Admitting: Nurse Practitioner

## 2015-01-29 DIAGNOSIS — I472 Ventricular tachycardia, unspecified: Secondary | ICD-10-CM

## 2015-01-29 DIAGNOSIS — I10 Essential (primary) hypertension: Secondary | ICD-10-CM

## 2015-01-29 DIAGNOSIS — A419 Sepsis, unspecified organism: Secondary | ICD-10-CM | POA: Insufficient documentation

## 2015-01-29 DIAGNOSIS — I4819 Other persistent atrial fibrillation: Secondary | ICD-10-CM

## 2015-01-29 DIAGNOSIS — I340 Nonrheumatic mitral (valve) insufficiency: Secondary | ICD-10-CM

## 2015-01-29 DIAGNOSIS — C159 Malignant neoplasm of esophagus, unspecified: Secondary | ICD-10-CM

## 2015-01-29 DIAGNOSIS — J690 Pneumonitis due to inhalation of food and vomit: Secondary | ICD-10-CM

## 2015-01-29 DIAGNOSIS — I4891 Unspecified atrial fibrillation: Secondary | ICD-10-CM

## 2015-01-29 DIAGNOSIS — I481 Persistent atrial fibrillation: Secondary | ICD-10-CM

## 2015-01-29 DIAGNOSIS — E876 Hypokalemia: Secondary | ICD-10-CM

## 2015-01-29 LAB — TROPONIN I
TROPONIN I: 0.14 ng/mL — AB (ref <0.031)
Troponin I: 0.16 ng/mL — ABNORMAL HIGH (ref <0.031)

## 2015-01-29 LAB — CBC WITH DIFFERENTIAL/PLATELET
BASOS PCT: 0 %
Basophils Absolute: 0 10*3/uL (ref 0–0.1)
EOS PCT: 0 %
Eosinophils Absolute: 0 10*3/uL (ref 0–0.7)
HEMATOCRIT: 32.3 % — AB (ref 40.0–52.0)
Hemoglobin: 11.3 g/dL — ABNORMAL LOW (ref 13.0–18.0)
LYMPHS PCT: 12 %
Lymphs Abs: 0.3 10*3/uL — ABNORMAL LOW (ref 1.0–3.6)
MCH: 31.9 pg (ref 26.0–34.0)
MCHC: 34.9 g/dL (ref 32.0–36.0)
MCV: 91.5 fL (ref 80.0–100.0)
Monocytes Absolute: 0.3 10*3/uL (ref 0.2–1.0)
Monocytes Relative: 11 %
NEUTROS PCT: 77 %
Neutro Abs: 2.2 10*3/uL (ref 1.4–6.5)
Platelets: 150 10*3/uL (ref 150–440)
RBC: 3.53 MIL/uL — ABNORMAL LOW (ref 4.40–5.90)
RDW: 21.5 % — AB (ref 11.5–14.5)
WBC: 2.8 10*3/uL — AB (ref 3.8–10.6)

## 2015-01-29 LAB — BASIC METABOLIC PANEL
ANION GAP: 10 (ref 5–15)
Anion gap: 7 (ref 5–15)
BUN: 20 mg/dL (ref 6–20)
BUN: 18 mg/dL (ref 6–20)
CALCIUM: 8.4 mg/dL — AB (ref 8.9–10.3)
CALCIUM: 8.3 mg/dL — AB (ref 8.9–10.3)
CHLORIDE: 100 mmol/L — AB (ref 101–111)
CO2: 23 mmol/L (ref 22–32)
CO2: 27 mmol/L (ref 22–32)
CREATININE: 1.23 mg/dL (ref 0.61–1.24)
Chloride: 101 mmol/L (ref 101–111)
Creatinine, Ser: 1.14 mg/dL (ref 0.61–1.24)
GFR calc non Af Amer: 55 mL/min — ABNORMAL LOW (ref >60)
GLUCOSE: 122 mg/dL — AB (ref 65–99)
Glucose, Bld: 116 mg/dL — ABNORMAL HIGH (ref 65–99)
Potassium: 3.2 mmol/L — ABNORMAL LOW (ref 3.5–5.1)
Potassium: 3.5 mmol/L (ref 3.5–5.1)
Sodium: 134 mmol/L — ABNORMAL LOW (ref 135–145)
Sodium: 134 mmol/L — ABNORMAL LOW (ref 135–145)

## 2015-01-29 LAB — CBC
HEMATOCRIT: 31.1 % — AB (ref 40.0–52.0)
Hemoglobin: 10.5 g/dL — ABNORMAL LOW (ref 13.0–18.0)
MCH: 31 pg (ref 26.0–34.0)
MCHC: 33.7 g/dL (ref 32.0–36.0)
MCV: 91.8 fL (ref 80.0–100.0)
Platelets: 144 10*3/uL — ABNORMAL LOW (ref 150–440)
RBC: 3.38 MIL/uL — ABNORMAL LOW (ref 4.40–5.90)
RDW: 20.9 % — AB (ref 11.5–14.5)
WBC: 2.6 10*3/uL — ABNORMAL LOW (ref 3.8–10.6)

## 2015-01-29 LAB — MAGNESIUM
Magnesium: 1.4 mg/dL — ABNORMAL LOW (ref 1.7–2.4)
Magnesium: 1.5 mg/dL — ABNORMAL LOW (ref 1.7–2.4)

## 2015-01-29 LAB — VANCOMYCIN, TROUGH: Vancomycin Tr: 13 ug/mL (ref 10–20)

## 2015-01-29 LAB — PHOSPHORUS: Phosphorus: 3 mg/dL (ref 2.5–4.6)

## 2015-01-29 LAB — PROTIME-INR
INR: 4.86 — AB
Prothrombin Time: 45.3 seconds — ABNORMAL HIGH (ref 11.4–15.0)

## 2015-01-29 MED ORDER — POTASSIUM CHLORIDE 10 MEQ/100ML IV SOLN
10.0000 meq | INTRAVENOUS | Status: AC
Start: 2015-01-29 — End: 2015-01-29
  Administered 2015-01-29 (×2): 10 meq via INTRAVENOUS
  Filled 2015-01-29 (×2): qty 100

## 2015-01-29 MED ORDER — MAGNESIUM SULFATE 2 GM/50ML IV SOLN
2.0000 g | Freq: Once | INTRAVENOUS | Status: AC
  Administered 2015-01-29: 2 g via INTRAVENOUS
  Filled 2015-01-29: qty 50

## 2015-01-29 MED ORDER — VANCOMYCIN HCL 10 G IV SOLR
1250.0000 mg | Freq: Two times a day (BID) | INTRAVENOUS | Status: DC
  Administered 2015-01-30: 1250 mg via INTRAVENOUS
  Filled 2015-01-29 (×2): qty 1250

## 2015-01-29 NOTE — Progress Notes (Signed)
Spoke with elink physcian and reported labs . No new orders will cont to monitor will repeat labs as ordered

## 2015-01-29 NOTE — Care Management Note (Signed)
Case Management Note  Patient Details  Name: [name] MRN: [id number] Date of Birth: [birth year]  Subjective/Objective:   Admitted due to sepsis and PNA.  Code Blue called due to V-tach, converted to Afib with RVR. Transferred to CCU. On Cardizem gtt. 3L [place]. Attempted to reach wife by phone and there was no answer or VM. Sister at bedside. She sates patient lives at home with his wife Jeremy Gordon. Will follow progression and assist with discharge planning           Action/Plan:   Expected Discharge Date:                  Expected Discharge Plan:     In-House Referral:     Discharge planning Services  CM Consult  Post Acute Care Choice:    Choice offered to:     DME Arranged:    DME Agency:     HH Arranged:    HH Agency:     Status of Service:  In process, will continue to follow  Medicare Important Message Given:  Yes-second notification given Date Medicare IM Given:    Medicare IM give by:    Date Additional Medicare IM Given:    Additional Medicare Important Message give by:     If discussed at [place] of Stay Meetings, dates discussed:    Additional Comments:  Jolly Mango, RN 01/29/2015, 11:22 AM

## 2015-01-29 NOTE — Progress Notes (Signed)
*  PRELIMINARY RESULTS* Echocardiogram 2D Echocardiogram has been performed.  Laqueta Jean Hege 01/29/2015, 11:33 AM

## 2015-01-29 NOTE — Progress Notes (Signed)
[facility] Progress Note Patient Name: CASHMERE HARMES DOB: [birth year] MRN: [id number]   Date of Service  01/29/2015  HPI/Events of Note  Low magnesium and K.   eICU Interventions  Given 20 Meq of KCL IV.  Given 2 mg of Mag IV.      Intervention Category Minor Interventions: Electrolytes abnormality - evaluation and management  Shalaya Swailes 01/29/2015, 1:41 AM

## 2015-01-29 NOTE — Consult Note (Addendum)
CARDIOLOGY CONSULT NOTE   Patient ID: Jeremy Gordon MRN: [id number], DOB/AGE: 76-07-1940   Admit date: 01/26/2015 Date of Consult: 01/29/2015   Primary Physician: Johny Blamer, MD - [facility] Primary Cardiologist: ? Followed @ [facility]  Pt. Profile  74 y/o male with a h/o persistent AF and esophageal CA, who was admitted to [facility] on 10/10 2/2 PNA/Sepsis and developed VT on the evening of 10/12.  Problem List  Past Medical History  Diagnosis Date  . Esophageal carcinoma ([place])     a. Chemo: FOLFIRI  . Sleep apnea   . Persistent atrial fibrillation ([place])     a. 07/2013 Echo: EF 60-65%, mod dil LA, mild TR;  CHA2DS2VASc = 3-->chronic coumadin.  . Essential hypertension   . History of stress test     a. 06/2003 MV: EF 56%, no ischemia/infarct.  Marland Kitchen COPD (chronic obstructive pulmonary disease) [facility])     Past Surgical History  Procedure Laterality Date  . Peripheral vascular catheterization N/A 11/20/2014    Procedure: Glori Luis Cath Insertion;  Surgeon: Algernon Huxley, MD;  Location: [place] [facility];  Service: Cardiovascular;  Laterality: N/A;     Allergies  No Known Allergies  HPI   74 y/o male with the above complex problem list.  He has a h/o persistent AFib and has been chronically anticoagulated with coumadin.  In the fall of 2015, he was dx with esophageal cancer and was subsequently treated with chemoradiation.  In May of this year, he was noted to have residual and clinically progressive dzs on a PET scan.  He was placed back on chemotherapy and has been followed in the cancer center here.  On 10/9, he began to note malaise with fevers and chills.  He called the cancer center, as he was due for a chemo treatment on 10/11, and was advised to present to the ED for evaluation.  There, he was febrile.  CXR read out as non-acute however CGT of Abs/Pelvis indicated RLL PNA.  He was admitted and placed on abx. In the setting of acute illness, he has been tachycardic (AF).  Throughout the day on  10/12, he became progressively tachycardic.  At 19:20, he went into VT.  He was found to be unresponsive and CPR was initiated.  He remained in VT for ~ 90 seconds and then broke spontaneously - back into rapid AF.  He did not require defib/antiarrhythmics/epi.  He was tx to ICU for further eval.  Labs showed mild hypokalemia (3.2) and hypomagnesemia (1.4).  Both were supplemented and are pending this AM.  Pt denies any c/p or dyspnea prior to the event.  He continues to c/o malaise/chills/diaphoresis.  Troponin has been elevated throughout admission with a flat trend (0.11  0.10 0.12 0.09 0.16).    Inpatient Medications  . Chlorhexidine Gluconate Cloth  6 each Topical Q0600  . diltiazem  120 mg Oral Daily  . finasteride  5 mg Oral Daily  . gabapentin  300 mg Oral BID  . magic mouthwash  5-10 mL Oral QID  . mupirocin ointment  1 application Nasal BID  . pantoprazole  40 mg Oral Daily  . piperacillin-tazobactam (ZOSYN)  IV  3.375 g Intravenous 3 times per day  . sucralfate  1 g Oral BID  . vancomycin  1,250 mg Intravenous Q12H  . Warfarin - Physician Dosing Inpatient   Does not apply q21    Family History Family History  Problem Relation Age of Onset  . Hypertension Sister   .  Hypertension Brother      Social History Social History   Social History  . Marital Status: Married    Spouse Name: N/A  . Number of Children: N/A  . Years of Education: N/A   Occupational History  . Not on file.   Social History Main Topics  . Smoking status: Former Smoker -- 23 years  . Smokeless tobacco: Current User    Types: Snuff  . Alcohol Use: No  . Drug Use: No  . Sexual Activity: Not on file   Other Topics Concern  . Not on file   Social History Narrative     Review of Systems  General:  +++ chills, fever, sweats. Cardiovascular:  No chest pain, +++ dyspnea, no edema, orthopnea, palpitations, paroxysmal nocturnal dyspnea. Dermatological: No rash, lesions/masses Respiratory: No  cough, +++ dyspnea Urologic: No hematuria, dysuria Abdominal:   +++ Anorexia, dysphagia.  No nausea, vomiting, diarrhea, bright red blood per rectum, melena, or hematemesis Neurologic:  No visual changes, wkns, changes in mental status. All other systems reviewed and are otherwise negative except as noted above.  Physical Exam  Blood pressure 124/68, pulse 103, temperature 102.8 F (39.3 C), temperature source Axillary, resp. rate 29, height 5\' 6"  (1.676 m), weight 246 lb (111.585 kg), SpO2 97 %.  General: Pleasant, NAD.  Diaphoretic. Psych: Flat affect. Neuro: Alert and oriented X 3. Moves all extremities spontaneously. HEENT: Very HOH, otw nl.  Neck: Supple, obese without bruits.  Difficult to assess jvp. Lungs:  Resp regular and unlabored, CTA on left, markedly diminished on right. Heart: IR, IR, tachy, distant.  No s3, s4, or murmurs. Abdomen: Soft, non-tender, non-distended, BS + x 4.  Extremities: No clubbing, cyanosis or edema. DP/PT/Radials 2+ and equal bilaterally.  Labs   Recent Labs  01/26/15 1826 01/26/15 2336 01/27/15 0546 01/29/15 0012  TROPONINI 0.10* 0.12* 0.09* 0.16*   Lab Results  Component Value Date   WBC 2.8* 01/29/2015   HGB 11.3* 01/29/2015   HCT 32.3* 01/29/2015   MCV 91.5 01/29/2015   PLT 150 01/29/2015    Recent Labs Lab 01/26/15 1249  01/29/15 0012  NA 133*  < > 134*  K 3.6  < > 3.2*  CL 97*  < > 100*  CO2 27  < > 27  BUN 19  < > 18  CREATININE 1.08  < > 1.23  CALCIUM 8.9  < > 8.3*  PROT 6.6  --   --   BILITOT 1.7*  --   --   ALKPHOS 72  --   --   ALT 15*  --   --   AST 23  --   --   GLUCOSE 128*  < > 122*  < > = values in this interval not displayed.  Radiology/Studies  Dg Chest 2 View  01/26/2015  CLINICAL DATA:  Weakness, chest pain EXAM: CHEST  2 VIEW COMPARISON:  11/10/2014 FINDINGS: Cardiomediastinal silhouette is stable. Right IJ Port-A-Cath with tip in distal SVC. No acute infiltrate or pleural effusion. No pulmonary  edema. Degenerative changes right shoulder. Mild degenerative changes mid thoracic spine. IMPRESSION: No active disease. Right IJ Port-A-Cath in place. Degenerative changes mid thoracic spine and right shoulder. Electronically Signed   By: Lahoma Crocker M.D.   On: 01/26/2015 13:16   Ct Abdomen Pelvis W Contrast  01/26/2015  CLINICAL DATA:  Fever, right upper quadrant pain. History of esophageal cancer. EXAM: CT ABDOMEN AND PELVIS WITH CONTRAST TECHNIQUE: Multidetector CT imaging of the abdomen  and pelvis was performed using the standard protocol following bolus administration of intravenous contrast. CONTRAST:  139mL OMNIPAQUE IOHEXOL 300 MG/ML  SOLN COMPARISON:  PET-CT July 23, 2014 FINDINGS: The liver, spleen, pancreas, gallbladder, adrenal glands are normal. There are small cysts within both kidneys. There is no hydronephrosis bilaterally. There is atherosclerosis of the abdominal aorta without aneurysmal dilatation. There is no abdominal lymphadenopathy. There is no small bowel obstruction. There is diverticulosis of colon without diverticulitis. The appendix is normal. There is a hiatal hernia. The bladder is decompressed. There is a 1.1 cm stone within the bladder lumen unchanged compared to prior PET-CT. Prostate calcifications are identified. Left inguinal herniation of mesenteric fat is noted. There is no pelvic lymphadenopathy. There is patchy ground-glass opacity involving the peripheral right lower lobe. There is no pleural effusion. Degenerative joint changes of the spine are noted. IMPRESSION: Right lower lobe pneumonia. No acute abnormality identified in the abdomen and pelvis. Electronically Signed   By: Abelardo Diesel M.D.   On: 01/26/2015 15:56   Dg Chest Port 1 View  01/28/2015  CLINICAL DATA:  74 year old male with shortness of breath and cough. Initial encounter. EXAM: PORTABLE CHEST 1 VIEW COMPARISON:  01/26/2015 and earlier. FINDINGS: Portable AP upright view at 2120 hours. Pacing pads  project over the left chest. Stable right chest porta cath. Increasing patchy right lung base and more confluent retrocardiac opacity, but no pneumothorax, pleural effusion or pulmonary edema. Stable cardiac size and mediastinal contours. IMPRESSION: New lung base opacity greater on the right could reflect atelectasis, aspiration or pneumonia. No pleural effusion. Electronically Signed   By: Genevie Ann M.D.   On: 01/28/2015 21:33   ECG  No 12 lead on chart - AF on tele currently.  ASSESSMENT AND PLAN  74 y/o male with a h/o persistent AF on coumadin and recurrent/residual esophageal cancer being managed with chemotherapy as an outpatient that was admitted 10/10 with fevers, chills, malaise, and RLL PNA that suffered a VT arrest in the setting of hypokalemia/hypomagnesemia on the evening of 10/12.  1.  VT Arrest:   Pt had progressively rapid AF throughout the day on 10/12 and developed VT and loss of consciousness @ 19:20 on the evening of 10/12.  He remained in VT for ~ 90 secs and then it broke spontaneously.  He did not require defibrillation, antiarrhythmics, or epi.  He was then tx to ICU. K and Mg were low and have been supplemented. He has had no further VT and remains in rapid AF on IV dilt in setting of RLL pna/sepsis. -Nl EF by echo @ [facility] in 2015.   -Echo performed here - read pending. -Follow K/Mg and supp as needed.  2.  Persistent AFib with elevated rates in setting of acute illness: Suspect rates driven by PNA/sepsis. -Cont IV dilt for rate control. -Coumadin per pharmacy (on hold in setting of supratherapeutic levels (4.5 10/12).  3.  Esophageal Cancer: Outpt mgmt per heme/onc.  4.  RLL PNA/sepsis: Abx per IM/CCM.  5.  Essential HTN:  Stable.  6.  Hypokalemia/Hypomagnesemia: F/u this AM and supp if necessary.  7.  Elevated troponin:   Prev nl Myoview in 2005, with nl EF by echo in 2015. F/U echo. -Suspect demand ischemia. -Not currently a good candidate for ischemic  w/u - follow trop trend following VT arrest.   Signed, Murray Hodgkins, NP 01/29/2015, 10:25 AM   Attending Note Patient seen and examined, agree with detailed note above,  Patient presentation and  plan discussed on rounds.   Yesterday through the course of the day with accelerating heart rate Patient seen last night for sustained VT code called,  Broke without intervention as detailed Stabilized overnight on diltiazem infusion, given Lasix IV, metoprolol push, nitroglycerin sublingual for severe hypertension  This morning rate is 100 up to 120 bpm, periods of hypotension, fever (improved with Tylenol). Underlying esophageal cancer, pneumonia --Would continue diltiazem infusion for now until infection improves, fevers resolve.  --If blood pressure continues to run low with poor heart rate control, would start amiodarone infusion --Currently on warfarin --Will need Lasix when necessary for worsening shortness of breath. Limited now in the setting of hypotension   high risk of further arrhythmia including VT, high risk of respiratory distress requiring intubation.    Signed: Esmond Plants  M.D., Ph.D.

## 2015-01-29 NOTE — Progress Notes (Signed)
Spoke with Dr. Rockey Situ in regards for plan of care for pt's heart rate. Dr. Rockey Situ stated to cancel the cardizem XR that is order from today and let pt stay on IV gtt. Stated if HR started became Afib RVR > 150's call him to obtain an order of Amiodarone.

## 2015-01-29 NOTE — Progress Notes (Signed)
PT Cancellation Note  Patient Details Name: Jeremy Gordon MRN: [id number] DOB: [birth year]   Cancelled Treatment:    Reason Eval/Treat Not Completed: Medical issues which prohibited therapy (Patient noted with decline in medical status (code blue called) and transfer to CCU secondary to vtach/afib with RVR.  Resting HR currently 139; will hold activity at this time and re-attempt at later time/date as medically appropriate.)   [name], PT, DPT, NCS 01/29/2015, 9:04 AM [phone]

## 2015-01-29 NOTE — Progress Notes (Signed)
Pt lying in bed resting quietly opt cont. On cardizem at 10mg  , pt electrolytes replaced as ordered. Oxygen at 3lnc sats wnl. Pt has no c/o pain or distress.

## 2015-01-29 NOTE — Progress Notes (Signed)
ANTIBIOTIC CONSULT NOTE - FOLLOW UP  Pharmacy Consult for Vancomycin  Indication: Febrile Neutropenia   No Known Allergies  Patient Measurements: Height: 5\' 6"  (167.6 cm) Weight: 246 lb (111.585 kg) IBW/kg (Calculated) : 63.8 Adjusted Body Weight: 82 kg  Vital Signs: Temp: 101.4 F (38.6 C) (10/13 1700) Temp Source: Oral (10/13 1700) BP: 126/86 mmHg (10/13 1800) Pulse Rate: 96 (10/13 1800) Intake/Output from previous day: 10/12 0701 - 10/13 0700 In: 1006 [P.O.:240; I.V.:116; IV Piggyback:650] Out: 8099 [Urine:1550] Intake/Output from this shift:    Labs:  Recent Labs  01/28/15 0443 01/29/15 0012 01/29/15 0702  WBC 1.9* 2.6* 2.8*  HGB 11.0* 10.5* 11.3*  PLT 136* 144* 150  CREATININE 1.00 1.23 1.14   Estimated Creatinine Clearance: 64.6 mL/min (by C-G formula based on Cr of 1.14).  Recent Labs  01/29/15 1742  [place] 13     Microbiology: Recent Results (from the past 720 hour(s))  Culture, blood (routine x 2)     Status: None (Preliminary result)   Collection Time: 01/26/15 12:49 PM  Result Value Ref Range Status   Specimen Description BLOOD LEFT ARM  Final   Special Requests BAA, 10CC  Final   Culture NO GROWTH 3 DAYS  Final   Report Status PENDING  Incomplete  Culture, blood (routine x 2)     Status: None (Preliminary result)   Collection Time: 01/26/15  1:52 PM  Result Value Ref Range Status   Specimen Description BLOOD RIGHT ARM  Final   Special Requests BAA, 5CC  Final   Culture NO GROWTH 3 DAYS  Final   Report Status PENDING  Incomplete  Urine culture     Status: None   Collection Time: 01/26/15  1:52 PM  Result Value Ref Range Status   Specimen Description URINE, CLEAN CATCH  Final   Special Requests NONE  Final   Culture NO GROWTH 1 DAY  Final   Report Status 01/27/2015 FINAL  Final  MRSA PCR Screening     Status: Abnormal   Collection Time: 01/28/15  7:50 PM  Result Value Ref Range Status   MRSA by PCR POSITIVE (A) NEGATIVE Final   Comment:        The GeneXpert MRSA Assay (FDA approved for NASAL specimens only), is one component of a comprehensive MRSA colonization surveillance program. It is not intended to diagnose MRSA infection nor to guide or monitor treatment for MRSA infections. READ BACK AND VERIFIED BY MICHELLE WILLIAMS @2237  01/28/15.Marland KitchenMarland KitchenAJO     Anti-infectives    Start     Dose/Rate Route Frequency Ordered Stop   01/30/15 0600  vancomycin (VANCOCIN) 1,250 mg in sodium chloride 0.9 % 250 mL IVPB     1,250 mg 166.7 mL/hr over 90 Minutes Intravenous Every 12 hours 01/29/15 1922     01/27/15 2030  piperacillin-tazobactam (ZOSYN) IVPB 3.375 g     3.375 g 12.5 mL/hr over 240 Minutes Intravenous 3 times per day 01/27/15 1121     01/27/15 1830  vancomycin (VANCOCIN) 1,250 mg in sodium chloride 0.9 % 250 mL IVPB  Status:  Discontinued     1,250 mg 166.7 mL/hr over 90 Minutes Intravenous Every 12 hours 01/27/15 1136 01/29/15 1916   01/27/15 1300  vancomycin (VANCOCIN) 1,250 mg in sodium chloride 0.9 % 250 mL IVPB     1,250 mg 166.7 mL/hr over 90 Minutes Intravenous  Once 01/27/15 1134 01/27/15 1556   01/27/15 1230  piperacillin-tazobactam (ZOSYN) IVPB 3.375 g     3.375 g  100 mL/hr over 30 Minutes Intravenous  Once 01/27/15 1121 01/27/15 1455   01/26/15 2300  piperacillin-tazobactam (ZOSYN) IVPB 3.375 g  Status:  Discontinued     3.375 g 12.5 mL/hr over 240 Minutes Intravenous 3 times per day 01/26/15 1758 01/26/15 1759   01/26/15 2130  vancomycin (VANCOCIN) IVPB 1000 mg/200 mL premix  Status:  Discontinued     1,000 mg 200 mL/hr over 60 Minutes Intravenous Every 12 hours 01/26/15 1756 01/26/15 1758   01/26/15 1730  cefTRIAXone (ROCEPHIN) 1 g in dextrose 5 % 50 mL IVPB  Status:  Discontinued     1 g 100 mL/hr over 30 Minutes Intravenous Every 24 hours 01/26/15 1715 01/27/15 1119   01/26/15 1730  azithromycin (ZITHROMAX) 250 mg in dextrose 5 % 125 mL IVPB  Status:  Discontinued     250 mg 125 mL/hr over  60 Minutes Intravenous Every 24 hours 01/26/15 1715 01/27/15 1119   01/26/15 1430  piperacillin-tazobactam (ZOSYN) IVPB 3.375 g     3.375 g 100 mL/hr over 30 Minutes Intravenous  Once 01/26/15 1423 01/26/15 1513   01/26/15 1430  vancomycin (VANCOCIN) IVPB 1000 mg/200 mL premix     1,000 mg 200 mL/hr over 60 Minutes Intravenous  Once 01/26/15 1423 01/26/15 1815      Assessment: 74 yo male admitted for sepsis and RLL pneumonia. Patient has a recently received chemotherapy for Esophageal cancer. Pharmacy is consulted Vancomycin dosing and monitoring  for febrile neutropenia. Patient also receiving Zosyn 3.375 q8 hours. Vancomycin was started at 1250mg  every 12 hours.   WBC: 2.8 ANC: 2.2 , Tmax 102.8, Scr 1.23 (up from 0.94), CrCl 60. Blood cultures X2  NG x 3 days   Goal of Therapy:  Vancomycin trough: 15-20  Plan:  1013 1730 VT 13.  Current vancomycin dose is 1250mg  IV Q12H.  Per [facility], nurse is unsure if patient received AM dose on 10/13, which could be why VT is subtherapeutic.  Will continue current dose for now and recheck trough tomorrow AM.  Norma Fredrickson, PharmD Clinical Pharmacist 01/29/2015

## 2015-01-29 NOTE — Progress Notes (Signed)
CRITICAL VALUE ALERT  Critical value received:  INR 4.86  Date of notification:  01/29/2015  Time of notification:  1132  Critical value read back:Yes.    Nurse who received alert:  Kathi Simpers, RN  MD notified (1st page):  Dr. Bridgett Larsson  Time of first page:  1135  MD notified (2nd page):  Time of second page:  Responding MD:  Dr. Bridgett Larsson  Time MD responded:  [phone]

## 2015-01-29 NOTE — Progress Notes (Signed)
ANTICOAGULATION CONSULT NOTE - Initial Consult  Pharmacy Consult for warfarin Indication: atrial fibrillation  No Known Allergies  Patient Measurements: Height: 5\' 6"  (167.6 cm) Weight: 246 lb (111.585 kg) IBW/kg (Calculated) : 63.8  Vital Signs: Temp: 99.5 F (37.5 C) (10/13 1200) Temp Source: Axillary (10/13 1200) BP: 120/78 mmHg (10/13 1400) Pulse Rate: 99 (10/13 1400)  Labs:  Recent Labs  01/27/15 0546 01/27/15 1113 01/28/15 0443 01/28/15 1105 01/29/15 0012 01/29/15 0702  HGB 10.9*  --  11.0*  --  10.5* 11.3*  HCT 31.7*  --  31.9*  --  31.1* 32.3*  PLT 126*  --  136*  --  144* 150  LABPROT  --  30.3*  --  43.0*  --  45.3*  INR  --  2.89  --  4.55*  --  4.86*  CREATININE 0.94  --  1.00  --  1.23 1.14  TROPONINI 0.09*  --   --   --  0.16* 0.14*    Estimated Creatinine Clearance: 64.6 mL/min (by C-G formula based on Cr of 1.14).   Medical History: Past Medical History  Diagnosis Date  . Esophageal carcinoma ([place])     a. Chemo: FOLFIRI  . Sleep apnea   . Persistent atrial fibrillation ([place])     a. 07/2013 Echo: EF 60-65%, mod dil LA, mild TR;  CHA2DS2VASc = 3-->chronic coumadin.  . Essential hypertension   . History of stress test     a. 06/2003 MV: EF 56%, no ischemia/infarct.  Marland Kitchen COPD (chronic obstructive pulmonary disease) ([place])      Assessment: Pharmacy consulted today to monitor and dose warfarin in this 74 year old male who has a history of atrial fibrillation. Patient was on warfarin prior to admission with a regimen of 12 mg warfarin daily.   Patients INR is supratherapeutic today at 4.86.   Goal of Therapy:  INR 2-3 Monitor platelets by anticoagulation protocol: Yes   Plan:  Continue to hold warfarin due to supratherapeutic INR. Pharmacy will continue to monitor.  Jeremy Gordon Jeremy Gordon 01/29/2015,3:30 PM

## 2015-01-29 NOTE — Progress Notes (Signed)
Speech Therapy Note: reviewed chart notes noting pt transferred to CCU. NSG consulted and did not report any concern for dysphagia; pt is taking meds w/ liquids but using puree if nec. Rec.'d continued use of aspiration and Reflux precautions sec. to pt's Esophageal dysmotility/deficits sec. to his Esophageal Cancer. Pt is at increased risk for aspiration of regurgitated material/Reflux. NSG agreed. ST will f/u as indicated for any further education.

## 2015-01-29 NOTE — Progress Notes (Addendum)
[place] at [place] NAME: Jeremy Gordon    MR#:  [id number]  DATE OF BIRTH:  04-30-1940  SUBJECTIVE:  CHIEF COMPLAINT:   Chief Complaint  Patient presents with  . Fever  . Dehydration  . Weakness   Transferred to ICU due to Afib with RVR, HR is 115, on cardizem drip, still cough and SOB, on O2 [place] 3 L.  REVIEW OF SYSTEMS:  CONSTITUTIONAL: has fever and chills,and generalized  weakness.  EYES: No blurred or double vision.  EARS, NOSE, AND THROAT: No tinnitus or ear pain.  RESPIRATORY: Has  cough, shortness of breath,  no wheezing or hemoptysis.  CARDIOVASCULAR: No chest pain, orthopnea, edema.  GASTROINTESTINAL: No nausea, vomiting, diarrhea or abdominal pain.  GENITOURINARY: No dysuria, hematuria.  ENDOCRINE: No polyuria, nocturia,  HEMATOLOGY: No anemia, easy bruising or bleeding SKIN: No rash or lesion. MUSCULOSKELETAL: No joint pain or arthritis.   NEUROLOGIC: No tingling, numbness, weakness.  PSYCHIATRY: No anxiety or depression.   DRUG ALLERGIES:  No Known Allergies  VITALS:  Blood pressure 110/67, pulse 121, temperature 99.5 F (37.5 C), temperature source Axillary, resp. rate 32, height 5\' 6"  (1.676 m), weight 111.585 kg (246 lb), SpO2 95 %.  PHYSICAL EXAMINATION:  GENERAL:  74 y.o.-year-old patient lying in the bed with no acute distress. Obese.  EYES: Pupils equal, round, reactive to light and accommodation. No scleral icterus. Extraocular muscles intact.  HEENT: Head atraumatic, normocephalic. Oropharynx and nasopharynx clear.  NECK:  Supple, no jugular venous distention. No thyroid enlargement, no tenderness.  LUNGS: Normal breath sounds bilaterally, no wheezing, but has mild crackles. No use of accessory muscles of respiration.  CARDIOVASCULAR: S1, S2 normal. No murmurs, rubs, or gallops.  ABDOMEN: Soft, nontender, nondistended. Bowel sounds present. No organomegaly or mass.  EXTREMITIES: No pedal edema,  cyanosis, or clubbing.  NEUROLOGIC: Cranial nerves II through XII are intact. Muscle strength 3-4/5 in all extremities. Sensation intact. Gait not checked.  PSYCHIATRIC: The patient is alert and oriented x 3.  SKIN: No obvious rash, lesion, or ulcer.    LABORATORY PANEL:   CBC  Recent Labs Lab 01/29/15 0702  WBC 2.8*  HGB 11.3*  HCT 32.3*  PLT 150   ------------------------------------------------------------------------------------------------------------------  Chemistries   Recent Labs Lab 01/26/15 1249  01/29/15 0012  NA 133*  < > 134*  K 3.6  < > 3.2*  CL 97*  < > 100*  CO2 27  < > 27  GLUCOSE 128*  < > 122*  BUN 19  < > 18  CREATININE 1.08  < > 1.23  CALCIUM 8.9  < > 8.3*  MG  --   < > 1.4*  AST 23  --   --   ALT 15*  --   --   ALKPHOS 72  --   --   BILITOT 1.7*  --   --   < > = values in this interval not displayed. ------------------------------------------------------------------------------------------------------------------  Cardiac Enzymes  Recent Labs Lab 01/29/15 0012  TROPONINI 0.16*   ------------------------------------------------------------------------------------------------------------------  RADIOLOGY:  Dg Chest Port 1 View  01/28/2015  CLINICAL DATA:  74 year old male with shortness of breath and cough. Initial encounter. EXAM: PORTABLE CHEST 1 VIEW COMPARISON:  01/26/2015 and earlier. FINDINGS: Portable AP upright view at 2120 hours. Pacing pads project over the left chest. Stable right chest porta cath. Increasing patchy right lung base and more confluent retrocardiac opacity, but no pneumothorax, pleural effusion or pulmonary edema. Stable  cardiac size and mediastinal contours. IMPRESSION: New lung base opacity greater on the right could reflect atelectasis, aspiration or pneumonia. No pleural effusion. Electronically Signed   By: Genevie Ann M.D.   On: 01/28/2015 21:33    EKG:   Orders placed or performed during the hospital  encounter of 01/26/15  . ED EKG 12-Lead  . ED EKG 12-Lead    ASSESSMENT AND PLAN:   * Right lower lobe pneumonia with sepsis. Continue Zosyn and vancomycin due to leukopenia and high fever. Discontinued IV Rocephin and azithromycin. Blood culture is negative so far.  * leukopenia with pancytopenia, possibly due to chemotherapy. WBC increased to 2.8 today. Follow-up CBC. Leukopenia protective isolation. Follow up Dr. Grayland Ormond as outpatient.  * Atrial fibrillation with RVR. On cardizem drip. INR is suprapeutic, discontinued Coumadin and continue Cardizem. Follow-up INR. Follow up echo and Dr. Rockey Situ.  * Esophageal cancer Continue pantoprazole and sucralfate. Per swallowing evaluation, no dysphagia but has choking with the high risk of aspiration. Aspiration precaution.  * Sleep apnea He does not use his CPAP at home  * Hypokalemia.potassium supplement given and follow-up level. * Hypomagnesemia. Mag given and follow up  Magnesium level. * Elevated troponin. Possible due to demanding ischemia.   All the records are reviewed and case discussed with Care Management/Social Workerr. Management plans discussed with the patient, sister and they are in agreement. Called his daughter again today, but nobody answer the phone. I discussed with Dr. Rockey Situ.  CODE STATUS: Full code  TOTAL CRITICAL TIME TAKING CARE OF THIS PATIENT: 45 minutes.   POSSIBLE D/C IN 3 DAYS, DEPENDING ON CLINICAL CONDITION.   Demetrios Loll M.D on 01/29/2015 at 8:10 AM  Between 7am to 6pm - Pager - [phone]  After 6pm go to [email or web address] - password [email or web address] [facility]  [place] [facility]  Office  [phone]  CC: Primary care physician; No PCP Per Patient

## 2015-01-29 NOTE — Progress Notes (Signed)
ANTIBIOTIC CONSULT NOTE - FOLLOW UP  Pharmacy Consult for Vancomycin  Indication: Febrile Neutropenia   No Known Allergies  Patient Measurements: Height: 5\' 6"  (167.6 cm) Weight: 246 lb (111.585 kg) IBW/kg (Calculated) : 63.8 Adjusted Body Weight: 82 kg  Vital Signs: Temp: 102.8 F (39.3 C) (10/13 0800) Temp Source: Axillary (10/13 0600) BP: 124/68 mmHg (10/13 0900) Pulse Rate: 103 (10/13 0900) Intake/Output from previous day: 10/12 0701 - 10/13 0700 In: 1006 [P.O.:240; I.V.:116; IV Piggyback:650] Out: 3151 [Urine:1550] Intake/Output from this shift: Total I/O In: 980 [P.O.:960; I.V.:20] Out: 200 [Urine:200]  Labs:  Recent Labs  01/27/15 0546 01/28/15 0443 01/29/15 0012 01/29/15 0702  WBC 2.7* 1.9* 2.6* 2.8*  HGB 10.9* 11.0* 10.5* 11.3*  PLT 126* 136* 144* 150  CREATININE 0.94 1.00 1.23  --    Estimated Creatinine Clearance: 59.9 mL/min (by C-G formula based on Cr of 1.23). No results for input(s): VANCOTROUGH, VANCOPEAK, VANCORANDOM, GENTTROUGH, GENTPEAK, GENTRANDOM, TOBRATROUGH, TOBRAPEAK, TOBRARND, AMIKACINPEAK, AMIKACINTROU, AMIKACIN in the last 72 hours.   Microbiology: Recent Results (from the past 720 hour(s))  Culture, blood (routine x 2)     Status: None (Preliminary result)   Collection Time: 01/26/15 12:49 PM  Result Value Ref Range Status   Specimen Description BLOOD LEFT ARM  Final   Special Requests BAA, 10CC  Final   Culture NO GROWTH 3 DAYS  Final   Report Status PENDING  Incomplete  Culture, blood (routine x 2)     Status: None (Preliminary result)   Collection Time: 01/26/15  1:52 PM  Result Value Ref Range Status   Specimen Description BLOOD RIGHT ARM  Final   Special Requests BAA, 5CC  Final   Culture NO GROWTH 3 DAYS  Final   Report Status PENDING  Incomplete  Urine culture     Status: None   Collection Time: 01/26/15  1:52 PM  Result Value Ref Range Status   Specimen Description URINE, CLEAN CATCH  Final   Special Requests NONE   Final   Culture NO GROWTH 1 DAY  Final   Report Status 01/27/2015 FINAL  Final  MRSA PCR Screening     Status: Abnormal   Collection Time: 01/28/15  7:50 PM  Result Value Ref Range Status   MRSA by PCR POSITIVE (A) NEGATIVE Final    Comment:        The GeneXpert MRSA Assay (FDA approved for NASAL specimens only), is one component of a comprehensive MRSA colonization surveillance program. It is not intended to diagnose MRSA infection nor to guide or monitor treatment for MRSA infections. READ BACK AND VERIFIED BY MICHELLE WILLIAMS @2237  01/28/15.Marland KitchenMarland KitchenAJO     Anti-infectives    Start     Dose/Rate Route Frequency Ordered Stop   01/27/15 2030  piperacillin-tazobactam (ZOSYN) IVPB 3.375 g     3.375 g 12.5 mL/hr over 240 Minutes Intravenous 3 times per day 01/27/15 1121     01/27/15 1830  vancomycin (VANCOCIN) 1,250 mg in sodium chloride 0.9 % 250 mL IVPB     1,250 mg 166.7 mL/hr over 90 Minutes Intravenous Every 12 hours 01/27/15 1136     01/27/15 1300  vancomycin (VANCOCIN) 1,250 mg in sodium chloride 0.9 % 250 mL IVPB     1,250 mg 166.7 mL/hr over 90 Minutes Intravenous  Once 01/27/15 1134 01/27/15 1556   01/27/15 1230  piperacillin-tazobactam (ZOSYN) IVPB 3.375 g     3.375 g 100 mL/hr over 30 Minutes Intravenous  Once 01/27/15 1121 01/27/15 1455  01/26/15 2300  piperacillin-tazobactam (ZOSYN) IVPB 3.375 g  Status:  Discontinued     3.375 g 12.5 mL/hr over 240 Minutes Intravenous 3 times per day 01/26/15 1758 01/26/15 1759   01/26/15 2130  vancomycin (VANCOCIN) IVPB 1000 mg/200 mL premix  Status:  Discontinued     1,000 mg 200 mL/hr over 60 Minutes Intravenous Every 12 hours 01/26/15 1756 01/26/15 1758   01/26/15 1730  cefTRIAXone (ROCEPHIN) 1 g in dextrose 5 % 50 mL IVPB  Status:  Discontinued     1 g 100 mL/hr over 30 Minutes Intravenous Every 24 hours 01/26/15 1715 01/27/15 1119   01/26/15 1730  azithromycin (ZITHROMAX) 250 mg in dextrose 5 % 125 mL IVPB  Status:   Discontinued     250 mg 125 mL/hr over 60 Minutes Intravenous Every 24 hours 01/26/15 1715 01/27/15 1119   01/26/15 1430  piperacillin-tazobactam (ZOSYN) IVPB 3.375 g     3.375 g 100 mL/hr over 30 Minutes Intravenous  Once 01/26/15 1423 01/26/15 1513   01/26/15 1430  vancomycin (VANCOCIN) IVPB 1000 mg/200 mL premix     1,000 mg 200 mL/hr over 60 Minutes Intravenous  Once 01/26/15 1423 01/26/15 1815      Assessment: 74 yo male admitted for sepsis and RLL pneumonia. Patient has a recently received chemotherapy for Esophageal cancer. Pharmacy is consulted Vancomycin dosing and monitoring  for febrile neutropenia. Patient also receiving Zosyn 3.375 q8 hours. Vancomycin was started at 1250mg  every 12 hours.   WBC: 2.8 ANC: 2.2 , Tmax 102.8, Scr 1.23 (up from 0.94), CrCl 60. Blood cultures X2  NG x 3 days   Goal of Therapy:  Vancomycin trough: 15-20  Plan:   Scr increase noted. Trough level ordered for 1730 today, Will follow up on level and make adjustments as needed.   Nancy Fetter, PharmD Pharmacy Resident

## 2015-01-30 LAB — CBC WITH DIFFERENTIAL/PLATELET
BASOS ABS: 0 10*3/uL (ref 0–0.1)
Basophils Relative: 0 %
EOS ABS: 0 10*3/uL (ref 0–0.7)
EOS PCT: 0 %
HCT: 31.3 % — ABNORMAL LOW (ref 40.0–52.0)
Hemoglobin: 11.1 g/dL — ABNORMAL LOW (ref 13.0–18.0)
LYMPHS ABS: 0.3 10*3/uL — AB (ref 1.0–3.6)
LYMPHS PCT: 5 %
MCH: 31.9 pg (ref 26.0–34.0)
MCHC: 35.5 g/dL (ref 32.0–36.0)
MCV: 90 fL (ref 80.0–100.0)
MONO ABS: 0.3 10*3/uL (ref 0.2–1.0)
Monocytes Relative: 6 %
Neutro Abs: 4.6 10*3/uL (ref 1.4–6.5)
Neutrophils Relative %: 89 %
PLATELETS: 176 10*3/uL (ref 150–440)
RBC: 3.48 MIL/uL — AB (ref 4.40–5.90)
RDW: 21.6 % — AB (ref 11.5–14.5)
WBC: 5.3 10*3/uL (ref 3.8–10.6)

## 2015-01-30 LAB — MAGNESIUM: MAGNESIUM: 1.7 mg/dL (ref 1.7–2.4)

## 2015-01-30 LAB — VANCOMYCIN, TROUGH: Vancomycin Tr: 16 ug/mL (ref 10–20)

## 2015-01-30 LAB — BASIC METABOLIC PANEL
Anion gap: 8 (ref 5–15)
BUN: 22 mg/dL — AB (ref 6–20)
CALCIUM: 8.6 mg/dL — AB (ref 8.9–10.3)
CO2: 26 mmol/L (ref 22–32)
Chloride: 99 mmol/L — ABNORMAL LOW (ref 101–111)
Creatinine, Ser: 0.93 mg/dL (ref 0.61–1.24)
GFR calc Af Amer: >60 mL/min (ref >60)
GLUCOSE: 117 mg/dL — AB (ref 65–99)
Potassium: 3.3 mmol/L — ABNORMAL LOW (ref 3.5–5.1)
SODIUM: 133 mmol/L — AB (ref 135–145)

## 2015-01-30 LAB — PROTIME-INR
INR: 4.98
Prothrombin Time: 46.1 seconds — ABNORMAL HIGH (ref 11.4–15.0)

## 2015-01-30 MED ORDER — POTASSIUM CHLORIDE 20 MEQ/15ML (10%) PO SOLN
20.0000 meq | ORAL | Status: AC
  Administered 2015-01-30 (×2): 20 meq
  Filled 2015-01-30 (×2): qty 15

## 2015-01-30 MED ORDER — POTASSIUM CHLORIDE CRYS ER 20 MEQ PO TBCR
20.0000 meq | EXTENDED_RELEASE_TABLET | ORAL | Status: DC
  Filled 2015-01-30: qty 1

## 2015-01-30 MED ORDER — DIGOXIN 0.25 MG/ML IJ SOLN
0.5000 mg | Freq: Once | INTRAMUSCULAR | Status: AC
  Administered 2015-01-30: 0.5 mg via INTRAVENOUS
  Filled 2015-01-30: qty 2

## 2015-01-30 MED ORDER — BISACODYL 5 MG PO TBEC: 10.0000 mg | DELAYED_RELEASE_TABLET | Freq: Every day | ORAL | Status: DC | PRN

## 2015-01-30 MED ORDER — LEVOFLOXACIN IN D5W 750 MG/150ML IV SOLN
750.0000 mg | INTRAVENOUS | Status: DC
  Administered 2015-01-30 – 2015-02-02 (×4): 750 mg via INTRAVENOUS
  Filled 2015-01-30 (×4): qty 150

## 2015-01-30 MED ORDER — DILTIAZEM HCL ER COATED BEADS 180 MG PO CP24
360.0000 mg | ORAL_CAPSULE | Freq: Every day | ORAL | Status: DC
  Administered 2015-01-30 – 2015-02-02 (×4): 360 mg via ORAL
  Filled 2015-01-30 (×4): qty 2

## 2015-01-30 MED ORDER — DOCUSATE SODIUM 100 MG PO CAPS
100.0000 mg | ORAL_CAPSULE | Freq: Two times a day (BID) | ORAL | Status: DC
  Administered 2015-01-30 – 2015-02-02 (×6): 100 mg via ORAL
  Filled 2015-01-30 (×7): qty 1

## 2015-01-30 NOTE — Progress Notes (Signed)
ANTIBIOTIC CONSULT NOTE - INITIAL  Pharmacy Consult for Levofloxacin  Indication: Pneumonia   No Known Allergies  Patient Measurements: Height: 5\' 6"  (167.6 cm) Weight: 246 lb (111.585 kg) IBW/kg (Calculated) : 63.8   Vital Signs: Temp: 99.1 F (37.3 C) (10/14 0400) Temp Source: Oral (10/14 0400) BP: 138/87 mmHg (10/14 0900) Pulse Rate: 111 (10/14 0900) Intake/Output from previous day: 10/13 0701 - 10/14 0700 In: 2730 [P.O.:1860; I.V.:220; IV Piggyback:650] Out: 750 [Urine:750] Intake/Output from this shift: Total I/O In: 46.3 [I.V.:46.3] Out: 300 [Urine:300]  Labs:  Recent Labs  01/29/15 0012 01/29/15 0702 01/30/15 0451  WBC 2.6* 2.8* 5.3  HGB 10.5* 11.3* 11.1*  PLT 144* 150 176  CREATININE 1.23 1.14 0.93   Estimated Creatinine Clearance: 79.2 mL/min (by C-G formula based on Cr of 0.93).  Recent Labs  01/29/15 1742 01/30/15 0451  VANCOTROUGH 36 16     Microbiology: Recent Results (from the past 720 hour(s))  Culture, blood (routine x 2)     Status: None (Preliminary result)   Collection Time: 01/26/15 12:49 PM  Result Value Ref Range Status   Specimen Description BLOOD LEFT ARM  Final   Special Requests BAA, 10CC  Final   Culture NO GROWTH 4 DAYS  Final   Report Status PENDING  Incomplete  Culture, blood (routine x 2)     Status: None (Preliminary result)   Collection Time: 01/26/15  1:52 PM  Result Value Ref Range Status   Specimen Description BLOOD RIGHT ARM  Final   Special Requests BAA, 5CC  Final   Culture NO GROWTH 4 DAYS  Final   Report Status PENDING  Incomplete  Urine culture     Status: None   Collection Time: 01/26/15  1:52 PM  Result Value Ref Range Status   Specimen Description URINE, CLEAN CATCH  Final   Special Requests NONE  Final   Culture NO GROWTH 1 DAY  Final   Report Status 01/27/2015 FINAL  Final  MRSA PCR Screening     Status: Abnormal   Collection Time: 01/28/15  7:50 PM  Result Value Ref Range Status   MRSA by PCR  POSITIVE (A) NEGATIVE Final    Comment:        The GeneXpert MRSA Assay (FDA approved for NASAL specimens only), is one component of a comprehensive MRSA colonization surveillance program. It is not intended to diagnose MRSA infection nor to guide or monitor treatment for MRSA infections. READ BACK AND VERIFIED BY MICHELLE WILLIAMS @2237  01/28/15.Marland KitchenMarland KitchenAJO     Medical History: Past Medical History  Diagnosis Date  . Esophageal carcinoma ([place])     a. Chemo: FOLFIRI  . Sleep apnea   . Persistent atrial fibrillation ([place])     a. 07/2013 Echo: EF 60-65%, mod dil LA, mild TR;  CHA2DS2VASc = 3-->chronic coumadin.  . Essential hypertension   . History of stress test     a. 06/2003 MV: EF 56%, no ischemia/infarct.  Marland Kitchen COPD (chronic obstructive pulmonary disease) (HCC)     Assessment: 74 yo male admitted for sepsis and RLL pneumonia. Patient has a recently received chemotherapy for Esophageal cancer. Patient was ordered Vancomycin for febrile neutropenia and was also receiving Zosyn 3.375 q8 hours. Vancomycin and Zosyn have been D/cd, pharmacy consulted for levofloxacin dosing for CAP/aspiration PNA  Tmax last 12 hours 99.4, ANC up to 4.6 (WBC: 5.3), Blood cultures X2 NG x4 days.   Plan:  Patient initiated on levofloxacin 750mg  daily. Pharmacy will continue to monitor renal  function and labs and make adjustments as needed.   Leliana Kontz M Dyson Sevey 01/30/2015,11:36 AM

## 2015-01-30 NOTE — Progress Notes (Addendum)
[place] at [place] NAME: Jeremy Gordon    MR#:  [id number]  DATE OF BIRTH:  [birth year]  SUBJECTIVE:  CHIEF COMPLAINT:   Chief Complaint  Patient presents with  . Fever  . Dehydration  . Weakness  Still on cardizem drip, still cough but no SOB, on O2 [place] 3 L.  REVIEW OF SYSTEMS:  CONSTITUTIONAL: has fever and chills,and generalized  weakness.  EYES: No blurred or double vision.  EARS, NOSE, AND THROAT: No tinnitus or ear pain.  RESPIRATORY: Has  cough, no shortness of breath,  no wheezing or hemoptysis.  CARDIOVASCULAR: No chest pain, orthopnea, edema.  GASTROINTESTINAL: No nausea, vomiting, diarrhea or abdominal pain.  GENITOURINARY: No dysuria, hematuria.  ENDOCRINE: No polyuria, nocturia,  HEMATOLOGY: No anemia, easy bruising or bleeding SKIN: No rash or lesion. MUSCULOSKELETAL: No joint pain or arthritis.   NEUROLOGIC: No tingling, numbness, weakness.  PSYCHIATRY: No anxiety or depression.   DRUG ALLERGIES:  No Known Allergies  VITALS:  Blood pressure 141/77, pulse 71, temperature 99.1 F (37.3 C), temperature source Oral, resp. rate 30, height 5\' 6"  (1.676 m), weight 111.585 kg (246 lb), SpO2 94 %.  PHYSICAL EXAMINATION:  GENERAL:  74 y.o.-year-old patient lying in the bed with no acute distress. Obese.  EYES: Pupils equal, round, reactive to light and accommodation. No scleral icterus. Extraocular muscles intact.  HEENT: Head atraumatic, normocephalic. Oropharynx and nasopharynx clear.  NECK:  Supple, no jugular venous distention. No thyroid enlargement, no tenderness.  LUNGS: Normal breath sounds bilaterally, no wheezing, but has mild crackles on right base. No use of accessory muscles of respiration.  CARDIOVASCULAR: S1, S2 normal. No murmurs, rubs, or gallops.  ABDOMEN: Soft, nontender, mild distension. Bowel sounds present. No organomegaly or mass.  EXTREMITIES: No pedal edema, cyanosis, or clubbing.   NEUROLOGIC: Cranial nerves II through XII are intact. Muscle strength 3-4/5 in all extremities. Sensation intact. Gait not checked.  PSYCHIATRIC: The patient is alert and oriented x 3.  SKIN: No obvious rash, lesion, or ulcer.    LABORATORY PANEL:   CBC  Recent Labs Lab 01/30/15 0451  WBC 5.3  HGB 11.1*  HCT 31.3*  PLT 176   ------------------------------------------------------------------------------------------------------------------  Chemistries   Recent Labs Lab 01/26/15 1249  01/30/15 0451  NA 133*  < > 133*  K 3.6  < > 3.3*  CL 97*  < > 99*  CO2 27  < > 26  GLUCOSE 128*  < > 117*  BUN 19  < > 22*  CREATININE 1.08  < > 0.93  CALCIUM 8.9  < > 8.6*  MG  --   < > 1.7  AST 23  --   --   ALT 15*  --   --   ALKPHOS 72  --   --   BILITOT 1.7*  --   --   < > = values in this interval not displayed. ------------------------------------------------------------------------------------------------------------------  Cardiac Enzymes  Recent Labs Lab 01/29/15 0702  TROPONINI 0.14*   ------------------------------------------------------------------------------------------------------------------  RADIOLOGY:  Dg Chest Port 1 View  01/28/2015  CLINICAL DATA:  74 year old male with shortness of breath and cough. Initial encounter. EXAM: PORTABLE CHEST 1 VIEW COMPARISON:  01/26/2015 and earlier. FINDINGS: Portable AP upright view at 2120 hours. Pacing pads project over the left chest. Stable right chest porta cath. Increasing patchy right lung base and more confluent retrocardiac opacity, but no pneumothorax, pleural effusion or pulmonary edema. Stable cardiac size and mediastinal contours.  IMPRESSION: New lung base opacity greater on the right could reflect atelectasis, aspiration or pneumonia. No pleural effusion. Electronically Signed   By: Genevie Ann M.D.   On: 01/28/2015 21:33    EKG:   Orders placed or performed during the hospital encounter of 01/26/15  . ED EKG  12-Lead  . ED EKG 12-Lead  . EKG 12-Lead  . EKG 12-Lead  . EKG 12-Lead  . EKG 12-Lead    ASSESSMENT AND PLAN:   * Right lower lobe pneumonia with sepsis. Continue Zosyn, discontinue vancomycin, add levaquin. Discontinued IV Rocephin and azithromycin. Blood culture is negative so far.  * leukopenia with pancytopenia, possibly due to chemotherapy. Improved.  WBC increased to 5.3 today. Discontinue Leukopenia protective isolation. Follow up Dr. Grayland Ormond as outpatient.  * Atrial fibrillation with RVR. Try to wean off cardizem drip. INR is still suprapeutic at 4.98, no active bleeding. Discontinued Coumadin. Follow-up INR. Echo: LV EF: 45% -  50% Follow up Dr. Rockey Situ.  * Esophageal cancer Continue pantoprazole and sucralfate. Per swallowing evaluation, no dysphagia but has choking with the high risk of aspiration. Aspiration precaution.  * Sleep apnea He does not use his CPAP at home  * Hypokalemia.potassium supplement given and follow-up level. * Hypomagnesemia. Mag given and improved. * Elevated troponin. Possible due to demanding ischemia.   All the records are reviewed and case discussed with Care Management/Social Workerr. Management plans discussed with the patient, brother and they are in agreement.  I discussed with Dr. Rockey Situ.  CODE STATUS: Full code  TOTAL CRITICAL TIME TAKING CARE OF THIS PATIENT: 46 minutes.   POSSIBLE D/C IN 3 DAYS, DEPENDING ON CLINICAL CONDITION.   Demetrios Loll M.D on 01/30/2015 at 8:33 AM  Between 7am to 6pm - Pager - [phone]  After 6pm go to [email or web address] - password [email or web address] [facility]  [place] [facility]  Office  [phone]  CC: Primary care physician; No PCP Per Patient

## 2015-01-30 NOTE — Progress Notes (Signed)
Initial Nutrition Assessment     INTERVENTION:  Meals and snacks: Cater to pt preferences Medical Nutrition Supplement Therapy: Will add mightyshake BID for added nutrition    NUTRITION DIAGNOSIS:   Inadequate oral intake related to acute illness as evidenced by per patient/family report.    GOAL:   Patient will meet greater than or equal to 90% of their needs    MONITOR:    (Energy intake, Electrolyte and renal profile, Digestive system)  REASON FOR ASSESSMENT:   Diagnosis    ASSESSMENT:      Pt admitted with aspiration pneumonia, fever, dehydration, V tach arrest in the ICU Past Medical History  Diagnosis Date  . Esophageal carcinoma ([place])     a. Chemo: FOLFIRI  . Sleep apnea   . Persistent atrial fibrillation ([place])     a. 07/2013 Echo: EF 60-65%, mod dil LA, mild TR;  CHA2DS2VASc = 3-->chronic coumadin.  . Essential hypertension   . History of stress test     a. 06/2003 MV: EF 56%, no ischemia/infarct.  Marland Kitchen COPD (chronic obstructive pulmonary disease) (HCC)     Current Nutrition: eating on average 52% of meals per I and O sheet.  Ate few bites of eggs, 100% of muffin this am, tray observed  Food/Nutrition-Related History: Pt reports decreased intake for the past week   Medications:  Colace, protonix, carafate  Electrolyte/Renal Profile and Glucose Profile:   Recent Labs Lab 01/29/15 0012 01/29/15 0702 01/30/15 0451  NA 134* 134* 133*  K 3.2* 3.5 3.3*  CL 100* 101 99*  CO2 27 23 26   BUN 18 20 22*  CREATININE 1.23 1.14 0.93  CALCIUM 8.3* 8.4* 8.6*  MG 1.4* 1.5* 1.7  PHOS 3.0  --   --   GLUCOSE 122* 116* 117*   Protein Profile:  Recent Labs Lab 01/26/15 1249  ALBUMIN 3.4*    Gastrointestinal Profile: Last BM: 10/11   Nutrition-Focused Physical Exam Findings: Nutrition-Focused physical exam completed. Findings are no fat depletion, no, lower extremities not able to be examed muscle depletion, and unable to assess edema.      Weight  Change: stable wt per wt encounters    Diet Order:  Diet 2 gram sodium Room service appropriate?: Yes; Fluid consistency:: Thin  Skin:   reviewed     Height:   Ht Readings from Last 1 Encounters:  01/26/15 5\' 6"  (1.676 m)    Weight:   Wt Readings from Last 1 Encounters:  01/26/15 246 lb (111.585 kg)     BMI:  Body mass index is 39.72 kg/(m^2).  Estimated Nutritional Needs:   Kcal:  Using IBW of 65kg BEE 1322 kcals (IF 1.0-1.2, AF 1.2) 6160-7371 kcals/d.   Protein:  Using IBW of 65kg (1.0-1.3 g/kg) 65-85 g/d  Fluid:  Using IBW of 65kg (30-48ml/kg) 1950-2275 ml/d  EDUCATION NEEDS:   No education needs identified at this time  [place]. Zenia Resides, [place], [place] (pager)

## 2015-01-30 NOTE — Progress Notes (Signed)
ANTICOAGULATION CONSULT NOTE   Pharmacy Consult for warfarin Indication: atrial fibrillation  No Known Allergies  Patient Measurements: Height: 5\' 6"  (167.6 cm) Weight: 246 lb (111.585 kg) IBW/kg (Calculated) : 63.8  Vital Signs: Temp: 98.3 F (36.8 C) (10/14 2000) Temp Source: Oral (10/14 2000) BP: 119/63 mmHg (10/14 2000) Pulse Rate: 98 (10/14 2000)  Labs:  Recent Labs  01/28/15 1105 01/29/15 0012 01/29/15 0702 01/30/15 0451  HGB  --  10.5* 11.3* 11.1*  HCT  --  31.1* 32.3* 31.3*  PLT  --  144* 150 176  LABPROT 43.0*  --  45.3* 46.1*  INR 4.55*  --  4.86* 4.98*  CREATININE  --  1.23 1.14 0.93  TROPONINI  --  0.16* 0.14*  --     Estimated Creatinine Clearance: 79.2 mL/min (by C-G formula based on Cr of 0.93).   Medical History: Past Medical History  Diagnosis Date  . Esophageal carcinoma ([place])     a. Chemo: FOLFIRI  . Sleep apnea   . Persistent atrial fibrillation ([place])     a. 07/2013 Echo: EF 60-65%, mod dil LA, mild TR;  CHA2DS2VASc = 3-->chronic coumadin.  . Essential hypertension   . History of stress test     a. 06/2003 MV: EF 56%, no ischemia/infarct.  Marland Kitchen COPD (chronic obstructive pulmonary disease) ([place])      Assessment: Pharmacy consulted today to monitor and dose warfarin in this 74 year old male who has a history of atrial fibrillation. Patient was on warfarin prior to admission with a regimen of 12 mg warfarin daily. Patient is on day 3 of antibiotics - currently on levofloxacin.   Patients INR is supratherapeutic today at 4.98.   Goal of Therapy:  INR 2-3 Monitor platelets by anticoagulation protocol: Yes   Plan:  Continue to hold warfarin due to supratherapeutic INR. Will plan on follow-up INR with am labs on 10/16.   Pharmacy will continue to monitor and adjust per consult.    Lagena Strand L 01/30/2015,9:35 PM

## 2015-01-30 NOTE — Progress Notes (Addendum)
Patient: Jeremy Gordon / Admit Date: 01/26/2015 / Date of Encounter: 01/30/2015, 10:12 AM   Subjective: Improved SOB today, no further VT on tele Rate elevated at 120 to 130 at rest BP stable. Significant wheezing  Review of Systems: Review of Systems  Respiratory: Positive for cough, shortness of breath and wheezing.   Cardiovascular: Negative.   Gastrointestinal: Negative.   Musculoskeletal: Negative.   Neurological: Positive for weakness.  Psychiatric/Behavioral: Negative.   All other systems reviewed and are negative.    Objective: Telemetry: atrial fib with rate 100 to 130s Physical Exam: Blood pressure 138/87, pulse 111, temperature 99.1 F (37.3 C), temperature source Oral, resp. rate 22, height 5\' 6"  (1.676 m), weight 246 lb (111.585 kg), SpO2 95 %. Body mass index is 39.72 kg/(m^2). General: Pleasant, NAD.  Psych: Flat affect. Neuro: Alert and oriented X 3. Moves all extremities spontaneously. HEENT: Very HOH, otw nl. Neck: Supple, obese without bruits. Difficult to assess jvp. Lungs: Resp regular and unlabored, CTA on left, markedly diminished on right, wheezing throughout Heart: IR, IR, tachy, distant. No s3, s4, or murmurs. Abdomen: Soft, non-tender, non-distended, BS + x 4.  Extremities: No clubbing, cyanosis or edema.    Intake/Output Summary (Last 24 hours) at 01/30/15 1012 Last data filed at 01/30/15 0900  Gross per 24 hour  Intake 1786.25 ml  Output    850 ml  Net 936.25 ml    Inpatient Medications:  . Chlorhexidine Gluconate Cloth  6 each Topical Q0600  . digoxin  0.5 mg Intravenous Once  . diltiazem  360 mg Oral Daily  . docusate sodium  100 mg Oral BID  . finasteride  5 mg Oral Daily  . gabapentin  300 mg Oral BID  . levofloxacin (LEVAQUIN) IV  750 mg Intravenous Q24H  . magic mouthwash  5-10 mL Oral QID  . mupirocin ointment  1 application Nasal BID  . pantoprazole  40 mg Oral Daily  . piperacillin-tazobactam (ZOSYN)   IV  3.375 g Intravenous 3 times per day  . potassium chloride  20 mEq Per Tube Q4H  . sucralfate  1 g Oral BID   Infusions:  . diltiazem (CARDIZEM) infusion 15 mg/hr (01/30/15 0745)    Labs:  Recent Labs  01/29/15 0012 01/29/15 0702 01/30/15 0451  NA 134* 134* 133*  K 3.2* 3.5 3.3*  CL 100* 101 99*  CO2 27 23 26   GLUCOSE 122* 116* 117*  BUN 18 20 22*  CREATININE 1.23 1.14 0.93  CALCIUM 8.3* 8.4* 8.6*  MG 1.4* 1.5* 1.7  PHOS 3.0  --   --    No results for input(s): AST, ALT, ALKPHOS, BILITOT, PROT, ALBUMIN in the last 72 hours.  Recent Labs  01/29/15 0702 01/30/15 0451  WBC 2.8* 5.3  NEUTROABS 2.2 4.6  HGB 11.3* 11.1*  HCT 32.3* 31.3*  MCV 91.5 90.0  PLT 150 176    Recent Labs  01/29/15 0012 01/29/15 0702  TROPONINI 0.16* 0.14*   Invalid input(s): POCBNP No results for input(s): HGBA1C in the last 72 hours.   Weights: Filed Weights   01/26/15 1232  Weight: 246 lb (111.585 kg)     Radiology/Studies:  Dg Chest 2 View  01/26/2015  CLINICAL DATA:  Weakness, chest pain EXAM: CHEST  2 VIEW COMPARISON:  11/10/2014 FINDINGS: Cardiomediastinal silhouette is stable. Right IJ Port-A-Cath with tip in distal SVC. No acute infiltrate or pleural effusion. No pulmonary edema. Degenerative changes right shoulder. Mild degenerative changes mid thoracic spine.  IMPRESSION: No active disease. Right IJ Port-A-Cath in place. Degenerative changes mid thoracic spine and right shoulder. Electronically Signed   By: Lahoma Crocker M.D.   On: 01/26/2015 13:16   Ct Abdomen Pelvis W Contrast  01/26/2015  CLINICAL DATA:  Fever, right upper quadrant pain. History of esophageal cancer. EXAM: CT ABDOMEN AND PELVIS WITH CONTRAST TECHNIQUE: Multidetector CT imaging of the abdomen and pelvis was performed using the standard protocol following bolus administration of intravenous contrast. CONTRAST:  132mL OMNIPAQUE IOHEXOL 300 MG/ML  SOLN COMPARISON:  PET-CT July 23, 2014 FINDINGS: The liver,  spleen, pancreas, gallbladder, adrenal glands are normal. There are small cysts within both kidneys. There is no hydronephrosis bilaterally. There is atherosclerosis of the abdominal aorta without aneurysmal dilatation. There is no abdominal lymphadenopathy. There is no small bowel obstruction. There is diverticulosis of colon without diverticulitis. The appendix is normal. There is a hiatal hernia. The bladder is decompressed. There is a 1.1 cm stone within the bladder lumen unchanged compared to prior PET-CT. Prostate calcifications are identified. Left inguinal herniation of mesenteric fat is noted. There is no pelvic lymphadenopathy. There is patchy ground-glass opacity involving the peripheral right lower lobe. There is no pleural effusion. Degenerative joint changes of the spine are noted. IMPRESSION: Right lower lobe pneumonia. No acute abnormality identified in the abdomen and pelvis. Electronically Signed   By: Abelardo Diesel M.D.   On: 01/26/2015 15:56   Dg Chest Port 1 View  01/28/2015  CLINICAL DATA:  74 year old male with shortness of breath and cough. Initial encounter. EXAM: PORTABLE CHEST 1 VIEW COMPARISON:  01/26/2015 and earlier. FINDINGS: Portable AP upright view at 2120 hours. Pacing pads project over the left chest. Stable right chest porta cath. Increasing patchy right lung base and more confluent retrocardiac opacity, but no pneumothorax, pleural effusion or pulmonary edema. Stable cardiac size and mediastinal contours. IMPRESSION: New lung base opacity greater on the right could reflect atelectasis, aspiration or pneumonia. No pleural effusion. Electronically Signed   By: Genevie Ann M.D.   On: 01/28/2015 21:33     Assessment and Plan   74 y/o male with a h/o persistent AF on coumadin and recurrent/residual esophageal cancer being managed with chemotherapy as an outpatient that was admitted 10/10 with fevers, chills, malaise, and RLL PNA that suffered a VT arrest in the setting of  hypokalemia/hypomagnesemia on the evening of 10/12.  1. VT Arrest:  Pt had progressively rapid AF throughout the day on 10/12 and developed VT and loss of consciousness @ 19:20 on the evening of 10/12. He remained in VT for ~ 90 secs and then it broke spontaneously. He did not require defibrillation, antiarrhythmics, or epi. He was then tx to ICU. K and Mg were low and have been supplemented. --- no further VT and remains in rapid AF on IV dilt in setting of RLL pna/sepsis.  2. Persistent AFib with elevated rates in setting of acute illness: Suspect rates driven by PNA/sepsis. --Will start digoxin 0.5 IV x1, then 0.125 daily --Will start diltiazem 360 mg daily We can wean the diltiazem infusion as tolerated  3. Esophageal Cancer: Outpt mgmt per heme/onc.  4. RLL PNA/sepsis: Abx per IM/CCM.  5. Essential HTN:  Stable., will change to ditiazem po  6. Hypokalemia/Hypomagnesemia: Replete to 4.0  7. Elevated troponin:  Prev nl Myoview in 2005, with nl EF by echo in 2015. F/U echo. -Suspect demand ischemia. -Not currently a good candidate for ischemic w/u - follow trop trend following VT  arrest.    Signed, Esmond Plants, MD [facility] 01/30/2015, 10:12 AM

## 2015-01-30 NOTE — Progress Notes (Signed)
[facility] ADULT ICU REPLACEMENT PROTOCOL FOR AM LAB REPLACEMENT ONLY  The patient does apply for the [facility] Adult ICU Electrolyte Replacment Protocol based on the criteria listed below:   1. Is GFR >/= 40 ml/min? Yes.    Patient's GFR today is >60 2. Is urine output >/= 0.5 ml/kg/hr for the last 6 hours? Yes.   Patient's UOP is 0.5 ml/kg/hr 3. Is BUN < 60 mg/dL? Yes.    Patient's BUN today is 22 4. Abnormal electrolyte(s): K 3.3 5. Ordered repletion with: per protocol 6. If a panic level lab has been reported, has the CCM MD in charge been notified? No..   Physician:    Ronda Fairly A 01/30/2015 6:14 AM

## 2015-01-30 NOTE — Progress Notes (Signed)
CRITICAL VALUE ALERT  Critical value received:  INR : 4.98  Date of notification: 01/30/2015  Time of notification: 5409   Critical value read back:Yes.    Nurse who received alert:  Kathi Simpers R.N.   MD notified (1st page):  Dr.Chen  Time of first page:  07:45  MD notified (2nd page):  Time of second page:  Responding MD:  Dr.Chen  Time MD responded:  07:53

## 2015-01-30 NOTE — Progress Notes (Signed)
Speech Therapy Note: consulted NSG re: pt's status today. NSG denied any s/s of aspiration or dysphagia w/ oral intake. Briefly discussed general aspiration and reflux precautions for pt; NSG agreed. ST will f/u next week as nec. NSG agreed.

## 2015-01-30 NOTE — Progress Notes (Signed)
ANTIBIOTIC CONSULT NOTE - FOLLOW UP  Pharmacy Consult for Vancomycin  Indication: Febrile Neutropenia   No Known Allergies  Patient Measurements: Height: 5\' 6"  (167.6 cm) Weight: 246 lb (111.585 kg) IBW/kg (Calculated) : 63.8 Adjusted Body Weight: 82 kg  Vital Signs: Temp: 99.1 F (37.3 C) (10/14 0400) Temp Source: Oral (10/14 0400) BP: 141/77 mmHg (10/14 0500) Pulse Rate: 71 (10/14 0500) Intake/Output from previous day: 10/13 0701 - 10/14 0700 In: 2730 [P.O.:1860; I.V.:220; IV Piggyback:650] Out: 750 [Urine:750] Intake/Output from this shift: Total I/O In: 450 [I.V.:100; IV Piggyback:350] Out: 350 [Urine:350]  Labs:  Recent Labs  01/29/15 0012 01/29/15 0702 01/30/15 0451  WBC 2.6* 2.8* 5.3  HGB 10.5* 11.3* 11.1*  PLT 144* 150 176  CREATININE 1.23 1.14 0.93   Estimated Creatinine Clearance: 79.2 mL/min (by C-G formula based on Cr of 0.93).  Recent Labs  01/29/15 1742 01/30/15 0451  VANCOTROUGH 17 16     Microbiology: Recent Results (from the past 720 hour(s))  Culture, blood (routine x 2)     Status: None (Preliminary result)   Collection Time: 01/26/15 12:49 PM  Result Value Ref Range Status   Specimen Description BLOOD LEFT ARM  Final   Special Requests BAA, 10CC  Final   Culture NO GROWTH 3 DAYS  Final   Report Status PENDING  Incomplete  Culture, blood (routine x 2)     Status: None (Preliminary result)   Collection Time: 01/26/15  1:52 PM  Result Value Ref Range Status   Specimen Description BLOOD RIGHT ARM  Final   Special Requests BAA, 5CC  Final   Culture NO GROWTH 3 DAYS  Final   Report Status PENDING  Incomplete  Urine culture     Status: None   Collection Time: 01/26/15  1:52 PM  Result Value Ref Range Status   Specimen Description URINE, CLEAN CATCH  Final   Special Requests NONE  Final   Culture NO GROWTH 1 DAY  Final   Report Status 01/27/2015 FINAL  Final  MRSA PCR Screening     Status: Abnormal   Collection Time: 01/28/15  7:50  PM  Result Value Ref Range Status   MRSA by PCR POSITIVE (A) NEGATIVE Final    Comment:        The GeneXpert MRSA Assay (FDA approved for NASAL specimens only), is one component of a comprehensive MRSA colonization surveillance program. It is not intended to diagnose MRSA infection nor to guide or monitor treatment for MRSA infections. READ BACK AND VERIFIED BY MICHELLE WILLIAMS @2237  01/28/15.Marland KitchenMarland KitchenAJO     Anti-infectives    Start     Dose/Rate Route Frequency Ordered Stop   01/30/15 0600  vancomycin (VANCOCIN) 1,250 mg in sodium chloride 0.9 % 250 mL IVPB     1,250 mg 166.7 mL/hr over 90 Minutes Intravenous Every 12 hours 01/29/15 1922     01/27/15 2030  piperacillin-tazobactam (ZOSYN) IVPB 3.375 g     3.375 g 12.5 mL/hr over 240 Minutes Intravenous 3 times per day 01/27/15 1121     01/27/15 1830  vancomycin (VANCOCIN) 1,250 mg in sodium chloride 0.9 % 250 mL IVPB  Status:  Discontinued     1,250 mg 166.7 mL/hr over 90 Minutes Intravenous Every 12 hours 01/27/15 1136 01/29/15 1916   01/27/15 1300  vancomycin (VANCOCIN) 1,250 mg in sodium chloride 0.9 % 250 mL IVPB     1,250 mg 166.7 mL/hr over 90 Minutes Intravenous  Once 01/27/15 1134 01/27/15 1556   01/27/15  1230  piperacillin-tazobactam (ZOSYN) IVPB 3.375 g     3.375 g 100 mL/hr over 30 Minutes Intravenous  Once 01/27/15 1121 01/27/15 1455   01/26/15 2300  piperacillin-tazobactam (ZOSYN) IVPB 3.375 g  Status:  Discontinued     3.375 g 12.5 mL/hr over 240 Minutes Intravenous 3 times per day 01/26/15 1758 01/26/15 1759   01/26/15 2130  vancomycin (VANCOCIN) IVPB 1000 mg/200 mL premix  Status:  Discontinued     1,000 mg 200 mL/hr over 60 Minutes Intravenous Every 12 hours 01/26/15 1756 01/26/15 1758   01/26/15 1730  cefTRIAXone (ROCEPHIN) 1 g in dextrose 5 % 50 mL IVPB  Status:  Discontinued     1 g 100 mL/hr over 30 Minutes Intravenous Every 24 hours 01/26/15 1715 01/27/15 1119   01/26/15 1730  azithromycin (ZITHROMAX) 250  mg in dextrose 5 % 125 mL IVPB  Status:  Discontinued     250 mg 125 mL/hr over 60 Minutes Intravenous Every 24 hours 01/26/15 1715 01/27/15 1119   01/26/15 1430  piperacillin-tazobactam (ZOSYN) IVPB 3.375 g     3.375 g 100 mL/hr over 30 Minutes Intravenous  Once 01/26/15 1423 01/26/15 1513   01/26/15 1430  vancomycin (VANCOCIN) IVPB 1000 mg/200 mL premix     1,000 mg 200 mL/hr over 60 Minutes Intravenous  Once 01/26/15 1423 01/26/15 1815      Assessment: 74 yo male admitted for sepsis and RLL pneumonia. Patient has a recently received chemotherapy for Esophageal cancer. Pharmacy is consulted Vancomycin dosing and monitoring  for febrile neutropenia. Patient also receiving Zosyn 3.375 q8 hours. Vancomycin was started at 1250mg  every 12 hours.   WBC: 2.8 ANC: 2.2 , Tmax 102.8, Scr 1.23 (up from 0.94), CrCl 60. Blood cultures X2  NG x 3 days   Goal of Therapy:  Vancomycin trough: 15-20  Plan:  1013 1730 VT 13.  Current vancomycin dose is 1250mg  IV Q12H.  Per [facility], nurse is unsure if patient received AM dose on 10/13, which could be why VT is subtherapeutic.  Will continue current dose for now and recheck trough tomorrow AM.  1014 0451 vancomycin trough therapeutic, continue current dose. Will follow renal function and check levels as needed.  Alvin Diffee A. [place], [place].D. Clinical Pharmacist 01/30/2015

## 2015-01-30 NOTE — Progress Notes (Signed)
Physical Therapy Treatment Patient Details Name: Jeremy Gordon MRN: [id number] DOB: Jan 28, 1941 Today's Date: 01/30/2015    History of Present Illness Patient is a 74 year old male with known recurrent esophageal cancer currently undergoing chemotherapy. He was evaluated in the emergency room for increasing weakness and fatigue, confusion, and fevers. He also had been complaining of shortness of breath.     PT Comments    Pt states that he has been sweating a lot today and that he had a fever. Vitals were checked prior to initiation of activity and were found to be WNL, although HR was slightly higher than normal resting HR (resting HR in high 90's, low 100s; with activity increased to max of 126). Pt was able to perform supine<>sitting on EOB transfer with mod assist. Pt needs assistance with swinging legs over the EOB and with control of trunk due to decreased core strength. Pt can maintain seated balance on EOB when both feet are in contact with ground without UE support. If feet are hovering above the ground, pt tends to lose balance posteriorly; needs to be guarded closely so he does not slide off of bed. Pt performed sit<> stand transfer with HHA +2; 2 instances bilat knee buckling noted when first coming to standing. PT provided pressure at posterior aspect of hip to promote adequate hip extension. Pt will continue benefit from skilled acute PT services in order to increase functional mobility independence and increase strength.   Follow Up Recommendations  SNF     Equipment Recommendations       Recommendations for Other Services       Precautions / Restrictions Precautions Precautions: Fall Restrictions Weight Bearing Restrictions: No    Mobility  Bed Mobility Overal bed mobility: Needs Assistance Bed Mobility: Supine to Sit     Supine to sit: Mod assist     General bed mobility comments: mod assist to for control of shoulders/trunk and swinging legs off EOB after pt  initiated movement. Pt does not possess great core strength and will fall backwards during transfer if not guarded closely.   Transfers Overall transfer level: Needs assistance Equipment used: 2 person hand held assist Transfers: Sit to/from Stand Sit to Stand: Mod assist         General transfer comment: mod assist required due to multiple instances of knee buckling experienced when pt first transfered into standing. Pressure applied at posterior aspect of hips to maintain hip extension for standing.   Ambulation/Gait Ambulation/Gait assistance:  (not performed)               Stairs            Wheelchair Mobility    Modified Rankin (Stroke Patients Only)       Balance Overall balance assessment: Needs assistance Sitting-balance support: Feet supported;Feet unsupported;No upper extremity supported;Bilateral upper extremity supported Sitting balance-Leahy Scale: Fair Sitting balance - Comments: Pt able to maintain sitting balance with both feet on floor and no UE support. Pt struggles to scoot himself toward EOB in order to get both feet on floor. Pt tends to lose balance posteriorly and holds onto bed tightly to prevent loss of balance when both feet are hovering above the ground when sitting.  Postural control: Posterior lean Standing balance support: Bilateral upper extremity supported;During functional activity (2 person HHA; forward trunk flexion and knees kept in slight flexion during stance) Standing balance-Leahy Scale: Poor  Cognition Arousal/Alertness: Awake/alert Behavior During Therapy: WFL for tasks assessed/performed Overall Cognitive Status: Within Functional Limits for tasks assessed                      Exercises Total Joint Exercises Ankle Circles/Pumps: AROM;Both;10 reps;Supine;Strengthening Heel Slides: AROM;Both;10 reps;Supine;Strengthening Hip ABduction/ADduction: AROM;Both;10  reps;Strengthening;Supine Straight Leg Raises: AROM;Strengthening;Both;10 reps;Supine Marching in Standing: AROM;Strengthening;Both;10 reps;Seated Other Exercises Other Exercises: resisted knee flexion/extensions x 10 bilat while seated on EOB Other Exercises: stabilizing reversals x 5 in anterior/posterior direction in order to facilitate core strengthening and seated balance    General Comments        Pertinent Vitals/Pain Pain Assessment: No/denies pain    Home Living                      Prior Function            PT Goals (current goals can now be found in the care plan section) Acute Rehab PT Goals Patient Stated Goal: to go home PT Goal Formulation: With patient Time For Goal Achievement: 02/11/15 Potential to Achieve Goals: Fair Progress towards PT goals: Progressing toward goals    Frequency  Min 2X/week    PT Plan Current plan remains appropriate    Co-evaluation             End of Session   Activity Tolerance: Patient tolerated treatment well Patient left: in bed;with nursing/sitter in room;with SCD's reapplied;with call bell/phone within reach     Time: 1417-1450 PT Time Calculation (min) (ACUTE ONLY): 33 min  Charges:                       G CodesMilon Score 01/30/2015, 3:09 PM

## 2015-01-31 LAB — CBC WITH DIFFERENTIAL/PLATELET
Basophils Absolute: 0 10*3/uL (ref 0–0.1)
Basophils Relative: 0 %
EOS ABS: 0.1 10*3/uL (ref 0–0.7)
EOS PCT: 1 %
HCT: 32.3 % — ABNORMAL LOW (ref 40.0–52.0)
HEMOGLOBIN: 11.1 g/dL — AB (ref 13.0–18.0)
LYMPHS ABS: 0.5 10*3/uL — AB (ref 1.0–3.6)
LYMPHS PCT: 5 %
MCH: 31.2 pg (ref 26.0–34.0)
MCHC: 34.2 g/dL (ref 32.0–36.0)
MCV: 91.3 fL (ref 80.0–100.0)
MONOS PCT: 6 %
Monocytes Absolute: 0.7 10*3/uL (ref 0.2–1.0)
NEUTROS PCT: 88 %
Neutro Abs: 9.7 10*3/uL — ABNORMAL HIGH (ref 1.4–6.5)
Platelets: 195 10*3/uL (ref 150–440)
RBC: 3.54 MIL/uL — ABNORMAL LOW (ref 4.40–5.90)
RDW: 21.9 % — ABNORMAL HIGH (ref 11.5–14.5)
WBC: 11.1 10*3/uL — ABNORMAL HIGH (ref 3.8–10.6)

## 2015-01-31 LAB — BASIC METABOLIC PANEL
Anion gap: 7 (ref 5–15)
BUN: 22 mg/dL — AB (ref 6–20)
CHLORIDE: 100 mmol/L — AB (ref 101–111)
CO2: 29 mmol/L (ref 22–32)
CREATININE: 0.86 mg/dL (ref 0.61–1.24)
Calcium: 8.8 mg/dL — ABNORMAL LOW (ref 8.9–10.3)
GFR calc Af Amer: >60 mL/min (ref >60)
GFR calc non Af Amer: >60 mL/min (ref >60)
Glucose, Bld: 106 mg/dL — ABNORMAL HIGH (ref 65–99)
Potassium: 3.5 mmol/L (ref 3.5–5.1)
SODIUM: 136 mmol/L (ref 135–145)

## 2015-01-31 LAB — CULTURE, BLOOD (ROUTINE X 2)
Culture: NO GROWTH
Culture: NO GROWTH

## 2015-01-31 MED ORDER — POTASSIUM CHLORIDE CRYS ER 20 MEQ PO TBCR
60.0000 meq | EXTENDED_RELEASE_TABLET | Freq: Once | ORAL | Status: AC
  Administered 2015-01-31: 60 meq via ORAL
  Filled 2015-01-31: qty 3

## 2015-01-31 MED ORDER — WARFARIN - PHARMACIST DOSING INPATIENT
Freq: Every day | Status: DC
  Administered 2015-01-31: 18:00:00

## 2015-01-31 MED ORDER — DIGOXIN 125 MCG PO TABS
0.1250 mg | ORAL_TABLET | Freq: Every day | ORAL | Status: DC
  Administered 2015-01-31 – 2015-02-02 (×3): 0.125 mg via ORAL
  Filled 2015-01-31 (×3): qty 1

## 2015-01-31 NOTE — Progress Notes (Signed)
ANTIBIOTIC CONSULT NOTE - Follow up  Pharmacy Consult for Levofloxacin  Indication: Pneumonia   No Known Allergies  Patient Measurements: Height: 5\' 6"  (167.6 cm) Weight: 246 lb (111.585 kg) IBW/kg (Calculated) : 63.8   Vital Signs: Temp: 99.1 F (37.3 C) (10/15 0800) Temp Source: Oral (10/15 0800) BP: 122/66 mmHg (10/15 1000) Pulse Rate: 94 (10/15 1000) Intake/Output from previous day: 10/14 0701 - 10/15 0700 In: 506.9 [P.O.:240; I.V.:116.9; IV Piggyback:150] Out: 800 [Urine:800] Intake/Output from this shift: Total I/O In: 150 [IV Piggyback:150] Out: 200 [Urine:200]  Labs:  Recent Labs  01/29/15 0702 01/30/15 0451 01/31/15 0426  WBC 2.8* 5.3 11.1*  HGB 11.3* 11.1* 11.1*  PLT 150 176 195  CREATININE 1.14 0.93 0.86   Estimated Creatinine Clearance: 85.7 mL/min (by C-G formula based on Cr of 0.86).  Recent Labs  01/29/15 1742 01/30/15 0451  VANCOTROUGH 23 16     Microbiology: Recent Results (from the past 720 hour(s))  Culture, blood (routine x 2)     Status: None (Preliminary result)   Collection Time: 01/26/15 12:49 PM  Result Value Ref Range Status   Specimen Description BLOOD LEFT ARM  Final   Special Requests BAA, 10CC  Final   Culture NO GROWTH 4 DAYS  Final   Report Status PENDING  Incomplete  Culture, blood (routine x 2)     Status: None (Preliminary result)   Collection Time: 01/26/15  1:52 PM  Result Value Ref Range Status   Specimen Description BLOOD RIGHT ARM  Final   Special Requests BAA, 5CC  Final   Culture NO GROWTH 4 DAYS  Final   Report Status PENDING  Incomplete  Urine culture     Status: None   Collection Time: 01/26/15  1:52 PM  Result Value Ref Range Status   Specimen Description URINE, CLEAN CATCH  Final   Special Requests NONE  Final   Culture NO GROWTH 1 DAY  Final   Report Status 01/27/2015 FINAL  Final  MRSA PCR Screening     Status: Abnormal   Collection Time: 01/28/15  7:50 PM  Result Value Ref Range Status   MRSA  by PCR POSITIVE (A) NEGATIVE Final    Comment:        The GeneXpert MRSA Assay (FDA approved for NASAL specimens only), is one component of a comprehensive MRSA colonization surveillance program. It is not intended to diagnose MRSA infection nor to guide or monitor treatment for MRSA infections. READ BACK AND VERIFIED BY MICHELLE WILLIAMS @2237  01/28/15.Marland KitchenMarland KitchenAJO     Medical History: Past Medical History  Diagnosis Date  . Esophageal carcinoma ([place])     a. Chemo: FOLFIRI  . Sleep apnea   . Persistent atrial fibrillation ([place])     a. 07/2013 Echo: EF 60-65%, mod dil LA, mild TR;  CHA2DS2VASc = 3-->chronic coumadin.  . Essential hypertension   . History of stress test     a. 06/2003 MV: EF 56%, no ischemia/infarct.  Marland Kitchen COPD (chronic obstructive pulmonary disease) ([facility])     Assessment: 74 yo male admitted for sepsis and RLL pneumonia. Patient has a recently received chemotherapy for Esophageal cancer. Patient was ordered Vancomycin for febrile neutropenia and was also receiving Zosyn 3.375 q8 hours. Vancomycin and Zosyn have been D/cd, pharmacy consulted for levofloxacin dosing for CAP/aspiration PNA  10/14: Tmax last 12 hours 99.4, ANC up to 4.6 (WBC: 5.3), Blood cultures X2 NG x4 days.   10/15: Tmax 102.9 on 10/14 1152  ANC left shift 4.6  to 9.7. WBC 11.1. Cx NGTD  Plan:  Patient initiated on levofloxacin 750mg  daily. Pharmacy will continue to monitor renal function and labs and make adjustments as needed.   Chinita Greenland PharmD Clinical Pharmacist 01/31/2015  10:25 AM
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
## 2015-01-31 NOTE — Progress Notes (Signed)
Pt. Transferred from ICU to 2A 235. Oriented to room, call bell, Ascom phones and staff. Bed in low position. Fall safety plan reviewed; pt is confused and unable to sign contract, but it is placed on the wall and signed by nurse. Yellow non-skid socks in place, bed alarm on. Full assessment to Epic. Will continue to monitor.

## 2015-01-31 NOTE — Progress Notes (Addendum)
[place] at [place] NAME: Jeremy Gordon    MR#:  [id number]  DATE OF BIRTH:  [birth year]  SUBJECTIVE:  CHIEF COMPLAINT:   Chief Complaint  Patient presents with  . Fever  . Dehydration  . Weakness  off cardizem drip and on by mouth Cardizem since yesterday, still cough but no SOB, on O2 [place] 2 L.  REVIEW OF SYSTEMS:  CONSTITUTIONAL: has fever and chills,and generalized  weakness.  EYES: No blurred or double vision.  EARS, NOSE, AND THROAT: No tinnitus or ear pain.  RESPIRATORY: Has  cough, no shortness of breath,  no wheezing or hemoptysis.  CARDIOVASCULAR: No chest pain, orthopnea, edema.  GASTROINTESTINAL: No nausea, vomiting, diarrhea or abdominal pain.  GENITOURINARY: No dysuria, hematuria.  ENDOCRINE: No polyuria, nocturia,  HEMATOLOGY: No anemia, easy bruising or bleeding SKIN: No rash or lesion. MUSCULOSKELETAL: No joint pain or arthritis.   NEUROLOGIC: No tingling, numbness, weakness.  PSYCHIATRY: No anxiety or depression.   DRUG ALLERGIES:  No Known Allergies  VITALS:  Blood pressure 112/54, pulse 85, temperature 99.1 F (37.3 C), temperature source Oral, resp. rate 32, height 5\' 6"  (1.676 m), weight 111.585 kg (246 lb), SpO2 92 %.  PHYSICAL EXAMINATION:  GENERAL:  74 y.o.-year-old patient lying in the bed with no acute distress. Obese.  EYES: Pupils equal, round, reactive to light and accommodation. No scleral icterus. Extraocular muscles intact.  HEENT: Head atraumatic, normocephalic. Oropharynx and nasopharynx clear.  NECK:  Supple, no jugular venous distention. No thyroid enlargement, no tenderness.  LUNGS: Normal breath sounds bilaterally, no wheezing, but has mild crackles on right base. No use of accessory muscles of respiration.  CARDIOVASCULAR: S1, S2 normal. No murmurs, rubs, or gallops.  ABDOMEN: Soft, nontender, mild distension. Bowel sounds present. No organomegaly or mass.  EXTREMITIES: No pedal  edema, cyanosis, or clubbing.  NEUROLOGIC: Cranial nerves II through XII are intact. Muscle strength 4/5 in all extremities. Sensation intact. Gait not checked.  PSYCHIATRIC: The patient is alert and oriented x 3.  SKIN: No obvious rash, lesion, or ulcer.    LABORATORY PANEL:   CBC  Recent Labs Lab 01/31/15 0426  WBC 11.1*  HGB 11.1*  HCT 32.3*  PLT 195   ------------------------------------------------------------------------------------------------------------------  Chemistries   Recent Labs Lab 01/26/15 1249  01/30/15 0451 01/31/15 0426  NA 133*  < > 133* 136  K 3.6  < > 3.3* 3.5  CL 97*  < > 99* 100*  CO2 27  < > 26 29  GLUCOSE 128*  < > 117* 106*  BUN 19  < > 22* 22*  CREATININE 1.08  < > 0.93 0.86  CALCIUM 8.9  < > 8.6* 8.8*  MG  --   < > 1.7  --   AST 23  --   --   --   ALT 15*  --   --   --   ALKPHOS 72  --   --   --   BILITOT 1.7*  --   --   --   < > = values in this interval not displayed. ------------------------------------------------------------------------------------------------------------------  Cardiac Enzymes  Recent Labs Lab 01/29/15 0702  TROPONINI 0.14*   ------------------------------------------------------------------------------------------------------------------  RADIOLOGY:  No results found.  EKG:   Orders placed or performed during the hospital encounter of 01/26/15  . ED EKG 12-Lead  . ED EKG 12-Lead  . EKG 12-Lead  . EKG 12-Lead  . EKG 12-Lead  . EKG 12-Lead  ASSESSMENT AND PLAN:   * Right lower lobe pneumonia with sepsis. Continue Levaquin, Zosyn and  Vancomycin were discontinued. Blood culture is negative so far.  * leukopenia with pancytopenia, possibly due to chemotherapy. Improved.   * Atrial fibrillation with RVR. Rate controlled. Continue by mouth Cardizem.INR is still suprapeutic at 4.98, no active bleeding.  Coumadin is on hold. Follow-up INR. Echo: LV EF: 45% -  50% Follow up Dr. Rockey Situ.  *  Esophageal cancer Continue pantoprazole and sucralfate. Per swallowing evaluation, no dysphagia but has choking with the high risk of aspiration. Aspiration precaution.  * Sleep apnea He does not use his CPAP at home  * Hypokalemia.potassium supplement given and improved. * Hypomagnesemia. Mag given and improved. * Elevated troponin. Possible due to demanding ischemia.  PT evaluation suggested that patient needed skilled nursing facility placement. All the records are reviewed and case discussed with Care Management/Social Workerr. Management plans discussed with the patient, brother and they are in agreement.    CODE STATUS: Full code  TOTAL CRITICAL TIME TAKING CARE OF THIS PATIENT: 42 minutes.   POSSIBLE D/C to [facility] FACILITY IN 2-3 DAYS, DEPENDING ON CLINICAL CONDITION.   Demetrios Loll M.D on 01/31/2015 at 12:16 PM  Between 7am to 6pm - Pager - [phone]  After 6pm go to [email or web address] - password [email or web address] [facility]  [place] [facility]  Office  [phone]  CC: Primary care physician; No PCP Per Patient

## 2015-01-31 NOTE — Progress Notes (Signed)
ANTICOAGULATION CONSULT NOTE   Pharmacy Consult for warfarin Indication: atrial fibrillation  No Known Allergies  Patient Measurements: Height: 5\' 6"  (167.6 cm) Weight: 246 lb (111.585 kg) IBW/kg (Calculated) : 63.8  Vital Signs: Temp: 99.1 F (37.3 C) (10/15 0800) Temp Source: Oral (10/15 0800) BP: 122/66 mmHg (10/15 1000) Pulse Rate: 94 (10/15 1000)  Labs:  Recent Labs  01/28/15 1105  01/29/15 0012 01/29/15 0702 01/30/15 0451 01/31/15 0426  HGB  --   < > 10.5* 11.3* 11.1* 11.1*  HCT  --   < > 31.1* 32.3* 31.3* 32.3*  PLT  --   < > 144* 150 176 195  LABPROT 43.0*  --   --  45.3* 46.1*  --   INR 4.55*  --   --  4.86* 4.98*  --   CREATININE  --   < > 1.23 1.14 0.93 0.86  TROPONINI  --   --  0.16* 0.14*  --   --   < > = values in this interval not displayed.  Estimated Creatinine Clearance: 85.7 mL/min (by C-G formula based on Cr of 0.86).   Medical History: Past Medical History  Diagnosis Date  . Esophageal carcinoma ([place])     a. Chemo: FOLFIRI  . Sleep apnea   . Persistent atrial fibrillation ([place])     a. 07/2013 Echo: EF 60-65%, mod dil LA, mild TR;  CHA2DS2VASc = 3-->chronic coumadin.  . Essential hypertension   . History of stress test     a. 06/2003 MV: EF 56%, no ischemia/infarct.  Marland Kitchen COPD (chronic obstructive pulmonary disease) ([place])      Assessment: Pharmacy consulted today to monitor and dose warfarin in this 74 year old male who has a history of atrial fibrillation. Patient was on warfarin prior to admission with a regimen of 12 mg warfarin daily. Patient is on day 3 of antibiotics - currently on levofloxacin.   Patients INR is supratherapeutic today 10/14 at 4.98.     Goal of Therapy:  INR 2-3 Monitor platelets by anticoagulation protocol: Yes   Plan:  Continue to hold warfarin due to supratherapeutic INR. Will plan on follow-up INR with am labs on 10/16.   10/15: INR ordered for am 10/16. Warfarin on hold. Patient on Levaquin.  Pharmacy  will continue to monitor and adjust per consult.    Chinita Greenland PharmD Clinical Pharmacist 01/31/2015 10:19 AM

## 2015-01-31 NOTE — Progress Notes (Signed)
Patient is alert and oriented to self. Reporting no pain. 2L n/c with oxygen saturation WNL. Voiding in urinal with intermittent spilling/incontinence. Pharmacy aware of coumadin dosing consult. To transfer to room 235. Report called to Maddie. Will transfer shortly.

## 2015-01-31 NOTE — Progress Notes (Signed)
SUBJECTIVE: Breathing is better. No chest pain.   Tele: atrial fib, rate 90-100 bpm.   BP 122/66 mmHg  Pulse 94  Temp(Src) 99.1 F (37.3 C) (Oral)  Resp 25  Ht 5\' 6"  (1.676 m)  Wt 246 lb (111.585 kg)  BMI 39.72 kg/m2  SpO2 94%  Intake/Output Summary (Last 24 hours) at 01/31/15 1042 Last data filed at 01/31/15 1000  Gross per 24 hour  Intake 595.67 ml  Output    700 ml  Net -104.33 ml    PHYSICAL EXAM General: Well developed, well nourished, in no acute distress. Alert and oriented x 3.  Psych:  Good affect, responds appropriately Neck: No JVD. No masses noted.  Lungs: Clear bilaterally with no wheezes or rhonci noted.  Heart: Irregular with no murmurs noted. Abdomen: Bowel sounds are present. Soft, non-tender.  Extremities: No lower extremity edema.   LABS: Basic Metabolic Panel:  Recent Labs  01/29/15 0012 01/29/15 0702 01/30/15 0451 01/31/15 0426  NA 134* 134* 133* 136  K 3.2* 3.5 3.3* 3.5  CL 100* 101 99* 100*  CO2 27 23 26 29   GLUCOSE 122* 116* 117* 106*  BUN 18 20 22* 22*  CREATININE 1.23 1.14 0.93 0.86  CALCIUM 8.3* 8.4* 8.6* 8.8*  MG 1.4* 1.5* 1.7  --   PHOS 3.0  --   --   --    CBC:  Recent Labs  01/30/15 0451 01/31/15 0426  WBC 5.3 11.1*  NEUTROABS 4.6 9.7*  HGB 11.1* 11.1*  HCT 31.3* 32.3*  MCV 90.0 91.3  PLT 176 195   Cardiac Enzymes:  Recent Labs  01/29/15 0012 01/29/15 0702  TROPONINI 0.16* 0.14*   Current Meds: . Chlorhexidine Gluconate Cloth  6 each Topical Q0600  . diltiazem  360 mg Oral Daily  . docusate sodium  100 mg Oral BID  . finasteride  5 mg Oral Daily  . gabapentin  300 mg Oral BID  . levofloxacin (LEVAQUIN) IV  750 mg Intravenous Q24H  . magic mouthwash  5-10 mL Oral QID  . mupirocin ointment  1 application Nasal BID  . pantoprazole  40 mg Oral Daily  . sucralfate  1 g Oral BID  . Warfarin - Pharmacist Dosing Inpatient   Does not apply q1800   Echo 01/29/15: Left ventricle: The cavity size was  normal. There was moderate concentric hypertrophy. Systolic function was mildly reduced. The estimated ejection fraction was in the range of 45% to 50%. Regional wall motion abnormalities cannot be excluded. The study is not technically sufficient to allow evaluation of LV diastolic function. - Mitral valve: There was mild regurgitation. - Left atrium: The atrium was moderately dilated. - Right ventricle: The cavity size was mildly dilated. Wall thickness was normal. Systolic function was normal. - Right atrium: The atrium was moderately dilated. - Tricuspid valve: There was mild-moderate regurgitation. - Pulmonary arteries: Systolic pressure was mildly elevated PA peak pressure: 37 mm Hg (S). Impressions: - Challenging image quality.   ASSESSMENT AND PLAN: 74 y/o male with a h/o persistent AF on coumadin and recurrent/residual esophageal cancer being managed with chemotherapy as an outpatient that was admitted 10/10 with fevers, chills, malaise, and RLL PNA that suffered a VT arrest in the setting of hypokalemia/hypomagnesemia on the evening of 10/12.  1. VT Arrest:Pt had progressively rapid AF throughout the day on 10/12 and developed VT and loss of consciousness @ 19:20 on the evening of 10/12. He remained in VT for ~ 90 secs  and then it broke spontaneously. He did not require defibrillation, antiarrhythmics, or epi. He was then tx to ICU. K and Mg were low and have been supplemented. No further VT. Atrial fib is rate controlled on po Diltiazem in setting of RLL pna/sepsis.  2. Persistent AFib: Rates now controlled. Will continue po Diltiazem and digoxin.   3. Esophageal Cancer: Outpt mgmt per heme/onc.  4. RLL PNA/sepsis: Abx per IM/CCM.  5. Essential HTN: BP stable.   6. Elevated troponin:Prev nl Myoview in 2005. LVEF low normal on echo 01/29/15. Suspect demand ischemia. Not currently a good candidate for ischemic w/u. Will follow  7. Hypokalemia:  Replace potassium this am.     MCALHANY,CHRISTOPHER  10/15/201610:42 AM

## 2015-02-01 LAB — CBC WITH DIFFERENTIAL/PLATELET
BASOS PCT: 0 %
Basophils Absolute: 0 10*3/uL (ref 0–0.1)
EOS PCT: 2 %
Eosinophils Absolute: 0.2 10*3/uL (ref 0–0.7)
HCT: 32.3 % — ABNORMAL LOW (ref 40.0–52.0)
HEMOGLOBIN: 11.1 g/dL — AB (ref 13.0–18.0)
LYMPHS ABS: 0.6 10*3/uL — AB (ref 1.0–3.6)
Lymphocytes Relative: 6 %
MCH: 31.4 pg (ref 26.0–34.0)
MCHC: 34.5 g/dL (ref 32.0–36.0)
MCV: 91.1 fL (ref 80.0–100.0)
Monocytes Absolute: 0.7 10*3/uL (ref 0.2–1.0)
Monocytes Relative: 6 %
NEUTROS PCT: 86 %
Neutro Abs: 10.1 10*3/uL — ABNORMAL HIGH (ref 1.4–6.5)
PLATELETS: 217 10*3/uL (ref 150–440)
RBC: 3.55 MIL/uL — AB (ref 4.40–5.90)
RDW: 21.4 % — ABNORMAL HIGH (ref 11.5–14.5)
WBC: 11.7 10*3/uL — AB (ref 3.8–10.6)

## 2015-02-01 LAB — PROTIME-INR
INR: 3.78
PROTHROMBIN TIME: 37.3 s — AB (ref 11.4–15.0)

## 2015-02-01 MED ORDER — INFLUENZA VAC SPLIT QUAD 0.5 ML IM SUSY
0.5000 mL | PREFILLED_SYRINGE | INTRAMUSCULAR | Status: AC
  Administered 2015-02-02: 0.5 mL via INTRAMUSCULAR
  Filled 2015-02-01: qty 0.5

## 2015-02-01 NOTE — Progress Notes (Signed)
Jeremy Gordon for warfarin Indication: atrial fibrillation  No Known Allergies  Patient Measurements: Height: 5\' 7"  (170.2 cm) (stated) Weight: 221 lb 6.4 oz (100.426 kg) (admission) IBW/kg (Calculated) : 66.1  Vital Signs: Temp: 98 F (36.7 C) (10/16 0507) BP: 113/65 mmHg (10/16 0507) Pulse Rate: 92 (10/16 0507)  Labs:  Recent Labs  01/30/15 0451 01/31/15 0426 02/01/15 0510  HGB 11.1* 11.1* 11.1*  HCT 31.3* 32.3* 32.3*  PLT 176 195 217  LABPROT 46.1*  --  37.3*  INR 4.98*  --  3.78  CREATININE 0.93 0.86  --     Estimated Creatinine Clearance: 82.5 mL/min (by C-G formula based on Cr of 0.86).   Medical History: Past Medical History  Diagnosis Date  . Esophageal carcinoma ([place])     a. Chemo: FOLFIRI  . Sleep apnea   . Persistent atrial fibrillation ([place])     a. 07/2013 Echo: EF 60-65%, mod dil LA, mild TR;  CHA2DS2VASc = 3-->chronic coumadin.  . Essential hypertension   . History of stress test     a. 06/2003 MV: EF 56%, no ischemia/infarct.  Marland Kitchen COPD (chronic obstructive pulmonary disease) ([place])      Assessment: Pharmacy consulted today to monitor and dose warfarin in this 74 year old male who has a history of atrial fibrillation. Patient was on warfarin prior to admission with a regimen of 12 mg warfarin daily. Patient is on day 3 of antibiotics - currently on levofloxacin.   Patients INR is supratherapeutic today 10/14 at 4.98.     Goal of Therapy:  INR 2-3 Monitor platelets by anticoagulation protocol: Yes   Plan:  Continue to hold warfarin due to supratherapeutic INR. Will plan on follow-up INR with am labs on 10/16.   10/15: INR ordered for am 10/16. Warfarin on hold. Patient on Levaquin.  10/16 INR 3.78. Will continue to hold warfarin. INR with am labs. Patient on Levaquin.  Pharmacy will continue to monitor and adjust per consult.    Chinita Greenland PharmD Clinical Pharmacist 02/01/2015 8:12 AM
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
## 2015-02-01 NOTE — Progress Notes (Signed)
[place] at [place] NAME: Jeremy Gordon    MR#:  [id number]  DATE OF BIRTH:  [birth year]  SUBJECTIVE:  CHIEF COMPLAINT:   Chief Complaint  Patient presents with  . Fever  . Dehydration  . Weakness  still cough but no SOB, on O2 [place] 2 L.  REVIEW OF SYSTEMS:  CONSTITUTIONAL: No fever and chills, has generalized  weakness.  EYES: No blurred or double vision.  EARS, NOSE, AND THROAT: No tinnitus or ear pain.  RESPIRATORY: Has  cough, no shortness of breath,  no wheezing or hemoptysis.  CARDIOVASCULAR: No chest pain, orthopnea, edema.  GASTROINTESTINAL: No nausea, vomiting, diarrhea or abdominal pain.  GENITOURINARY: No dysuria, hematuria.  ENDOCRINE: No polyuria, nocturia,  HEMATOLOGY: No anemia, easy bruising or bleeding SKIN: No rash or lesion. MUSCULOSKELETAL: No joint pain or arthritis.   NEUROLOGIC: No tingling, numbness, weakness.  PSYCHIATRY: No anxiety or depression.   DRUG ALLERGIES:  No Known Allergies  VITALS:  Blood pressure 113/65, pulse 92, temperature 98 F (36.7 C), temperature source Oral, resp. rate 18, height 5\' 7"  (1.702 m), weight 100.426 kg (221 lb 6.4 oz), SpO2 97 %.  PHYSICAL EXAMINATION:  GENERAL:  74 y.o.-year-old patient lying in the bed with no acute distress. Obese.  EYES: Pupils equal, round, reactive to light and accommodation. No scleral icterus. Extraocular muscles intact.  HEENT: Head atraumatic, normocephalic. Oropharynx and nasopharynx clear.  NECK:  Supple, no jugular venous distention. No thyroid enlargement, no tenderness.  LUNGS: Normal breath sounds bilaterally, no wheezing, but has mild crackles on right base. No use of accessory muscles of respiration.  CARDIOVASCULAR: S1, S2 normal. No murmurs, rubs, or gallops.  ABDOMEN: Soft, nontender, mild distension. Bowel sounds present. No organomegaly or mass.  EXTREMITIES: No pedal edema, cyanosis, or clubbing.  NEUROLOGIC: Cranial nerves  II through XII are intact. Muscle strength 4/5 in all extremities. Sensation intact. Gait not checked.  PSYCHIATRIC: The patient is alert and oriented x 3.  SKIN: No obvious rash, lesion, or ulcer.    LABORATORY PANEL:   CBC  Recent Labs Lab 02/01/15 0510  WBC 11.7*  HGB 11.1*  HCT 32.3*  PLT 217   ------------------------------------------------------------------------------------------------------------------  Chemistries   Recent Labs Lab 01/26/15 1249  01/30/15 0451 01/31/15 0426  NA 133*  < > 133* 136  K 3.6  < > 3.3* 3.5  CL 97*  < > 99* 100*  CO2 27  < > 26 29  GLUCOSE 128*  < > 117* 106*  BUN 19  < > 22* 22*  CREATININE 1.08  < > 0.93 0.86  CALCIUM 8.9  < > 8.6* 8.8*  MG  --   < > 1.7  --   AST 23  --   --   --   ALT 15*  --   --   --   ALKPHOS 72  --   --   --   BILITOT 1.7*  --   --   --   < > = values in this interval not displayed. ------------------------------------------------------------------------------------------------------------------  Cardiac Enzymes  Recent Labs Lab 01/29/15 0702  TROPONINI 0.14*   ------------------------------------------------------------------------------------------------------------------  RADIOLOGY:  No results found.  EKG:   Orders placed or performed during the hospital encounter of 01/26/15  . ED EKG 12-Lead  . ED EKG 12-Lead  . EKG 12-Lead  . EKG 12-Lead  . EKG 12-Lead  . EKG 12-Lead    ASSESSMENT AND PLAN:   *  Right lower lobe pneumonia with sepsis. Continue Levaquin. Zosyn and  Vancomycin were discontinued. Blood culture is negative so far.  * leukopenia with pancytopenia, possibly due to chemotherapy. Improved.   * Atrial fibrillation with RVR. Rate controlled. Continue by mouth Cardizem.INR is still suprapeutic but down to 3.78, no active bleeding.  Coumadin is on hold. Follow-up INR. Echo: LV EF: 45% -  50% Follow up Dr. Rockey Situ.  * Esophageal cancer Continue pantoprazole and  sucralfate. Per swallowing evaluation, no dysphagia but has choking with the high risk of aspiration. Aspiration precaution.   * Sleep apnea He does not use his CPAP at home  * Hypokalemia.potassium supplement given and improved. * Hypomagnesemia. Mag given and improved. * Elevated troponin. Possible due to demanding ischemia.  PT evaluation suggested that patient needed skilled nursing facility placement. All the records are reviewed and case discussed with Care Management/Social Workerr. Management plans discussed with the patient, brother and they are in agreement.    CODE STATUS: Full code  TOTAL CRITICAL TIME TAKING CARE OF THIS PATIENT: 38 minutes.   POSSIBLE D/C to [facility] FACILITY IN 2-3 DAYS, DEPENDING ON CLINICAL CONDITION.   Demetrios Loll M.D on 02/01/2015 at 11:10 AM  Between 7am to 6pm - Pager - [phone]  After 6pm go to [email or web address] - password [email or web address] [facility]  [place] [facility]  Office  [phone]  CC: Primary care physician; No PCP Per Patient

## 2015-02-01 NOTE — Progress Notes (Signed)
     SUBJECTIVE: Feeling better. No pain. Breathing improving.   Tele: atrial fib with PVCs. HR 90s  BP 138/78 mmHg  Pulse 73  Temp(Src) 97.6 F (36.4 C) (Oral)  Resp 18  Ht 5\' 7"  (1.702 m)  Wt 221 lb 6.4 oz (100.426 kg)  BMI 34.67 kg/m2  SpO2 97%  Intake/Output Summary (Last 24 hours) at 02/01/15 1120 Last data filed at 02/01/15 0523  Gross per 24 hour  Intake      0 ml  Output    750 ml  Net   -750 ml    PHYSICAL EXAM General: Well developed, well nourished, in no acute distress. Alert and oriented x 3.  Psych:  Good affect, responds appropriately Neck: No JVD. No masses noted.  Lungs: Clear bilaterally with no wheezes or rhonci noted.  Heart: Irregular with no murmurs noted. Abdomen: Bowel sounds are present. Soft, non-tender.  Extremities: No lower extremity edema.   LABS: Basic Metabolic Panel:  Recent Labs  01/30/15 0451 01/31/15 0426  NA 133* 136  K 3.3* 3.5  CL 99* 100*  CO2 26 29  GLUCOSE 117* 106*  BUN 22* 22*  CREATININE 0.93 0.86  CALCIUM 8.6* 8.8*  MG 1.7  --    CBC:  Recent Labs  01/31/15 0426 02/01/15 0510  WBC 11.1* 11.7*  NEUTROABS 9.7* 10.1*  HGB 11.1* 11.1*  HCT 32.3* 32.3*  MCV 91.3 91.1  PLT 195 217    Current Meds: . Chlorhexidine Gluconate Cloth  6 each Topical Q0600  . digoxin  0.125 mg Oral Daily  . diltiazem  360 mg Oral Daily  . docusate sodium  100 mg Oral BID  . finasteride  5 mg Oral Daily  . gabapentin  300 mg Oral BID  . [START ON 02/02/2015] Influenza vac split quadrivalent PF  0.5 mL Intramuscular Tomorrow-1000  . levofloxacin (LEVAQUIN) IV  750 mg Intravenous Q24H  . magic mouthwash  5-10 mL Oral QID  . mupirocin ointment  1 application Nasal BID  . pantoprazole  40 mg Oral Daily  . sucralfate  1 g Oral BID  . Warfarin - Pharmacist Dosing Inpatient   Does not apply q1800     ASSESSMENT AND PLAN: 74 y/o male with a h/o persistent AF on coumadin and recurrent/residual esophageal cancer being managed  with chemotherapy as an outpatient that was admitted 10/10 with fevers, chills, malaise, and RLL PNA that suffered a VT arrest in the setting of hypokalemia/hypomagnesemia on the evening of 10/12. Pt had progressively rapid AF throughout the day on 10/12 and developed VT and loss of consciousness @ 19:20 on the evening of 10/12. He remained in VT for ~ 90 secs and then it broke spontaneously. He did not require defibrillation, antiarrhythmics, or epi. He was then tx to ICU. K and Mg were low and have been supplemented.  1. VT Arrest: No further VT. Atrial fib is rate controlled on po Diltiazem in setting of RLL PNA/sepsis.  2. Persistent AFib: Rates now controlled. Will continue po Diltiazem and digoxin. INR 3.78 this am. Coumadin has been on hold.   3. Esophageal Cancer: Outpt mgmt per heme/onc.  4. RLL PNA/sepsis: Abx per IM/CCM.  5. Essential HTN: BP stable.   6. Elevated troponin:Prev nl Myoview in 2005. LVEF low normal on echo 01/29/15. Suspect demand ischemia. Not currently a good candidate for ischemic w/u. Will follow      MCALHANY,CHRISTOPHER  10/16/201611:20 AM

## 2015-02-02 DIAGNOSIS — A419 Sepsis, unspecified organism: Secondary | ICD-10-CM | POA: Diagnosis not present

## 2015-02-02 LAB — CBC WITH DIFFERENTIAL/PLATELET
BASOS ABS: 0 10*3/uL (ref 0–0.1)
BASOS PCT: 0 %
EOS ABS: 0.2 10*3/uL (ref 0–0.7)
EOS PCT: 2 %
HCT: 30.9 % — ABNORMAL LOW (ref 40.0–52.0)
Hemoglobin: 10.6 g/dL — ABNORMAL LOW (ref 13.0–18.0)
Lymphocytes Relative: 6 %
Lymphs Abs: 0.6 10*3/uL — ABNORMAL LOW (ref 1.0–3.6)
MCH: 31.3 pg (ref 26.0–34.0)
MCHC: 34.3 g/dL (ref 32.0–36.0)
MCV: 91.2 fL (ref 80.0–100.0)
MONO ABS: 0.8 10*3/uL (ref 0.2–1.0)
Monocytes Relative: 9 %
Neutro Abs: 8.1 10*3/uL — ABNORMAL HIGH (ref 1.4–6.5)
Neutrophils Relative %: 83 %
PLATELETS: 219 10*3/uL (ref 150–440)
RBC: 3.39 MIL/uL — AB (ref 4.40–5.90)
RDW: 21.6 % — ABNORMAL HIGH (ref 11.5–14.5)
WBC: 9.8 10*3/uL (ref 3.8–10.6)

## 2015-02-02 LAB — PROTIME-INR
INR: 3.05
PROTHROMBIN TIME: 31.6 s — AB (ref 11.4–15.0)

## 2015-02-02 MED ORDER — DIGOXIN 125 MCG PO TABS: 0.1250 mg | ORAL_TABLET | Freq: Every day | ORAL | Status: DC

## 2015-02-02 MED ORDER — DILTIAZEM HCL ER COATED BEADS 360 MG PO CP24: 360.0000 mg | ORAL_CAPSULE | Freq: Every day | ORAL | Status: DC

## 2015-02-02 MED ORDER — LEVOFLOXACIN 500 MG PO TABS: 500.0000 mg | ORAL_TABLET | Freq: Every day | ORAL | Status: DC

## 2015-02-02 MED ORDER — HYDROCODONE-ACETAMINOPHEN 10-325 MG PO TABS: 1.0000 | ORAL_TABLET | Freq: Four times a day (QID) | ORAL | Status: DC | PRN

## 2015-02-02 MED ORDER — WARFARIN SODIUM 4 MG PO TABS: 8.0000 mg | ORAL_TABLET | Freq: Every evening | ORAL | Status: DC

## 2015-02-02 MED ORDER — WARFARIN SODIUM 5 MG PO TABS: 9.5000 mg | ORAL_TABLET | Freq: Once | ORAL | Status: DC

## 2015-02-02 NOTE — Progress Notes (Signed)
     SUBJECTIVE: Feeling better. No pain. Breathing improving.   Tele: atrial fib with PVCs. HR 80- 90s  BP 122/56 mmHg  Pulse 89  Temp(Src) 98.6 F (37 C) (Oral)  Resp 16  Ht 5\' 7"  (1.702 m)  Wt 221 lb 6.4 oz (100.426 kg)  BMI 34.67 kg/m2  SpO2 96%  Intake/Output Summary (Last 24 hours) at 02/02/15 0824 Last data filed at 02/02/15 0520  Gross per 24 hour  Intake      0 ml  Output   1250 ml  Net  -1250 ml    PHYSICAL EXAM General: Well developed, well nourished, in no acute distress. Alert and oriented x 3.  Psych:  Good affect, responds appropriately Neck: No JVD. No masses noted.  Lungs: Clear bilaterally with no wheezes or rhonci noted.  Heart: Irregular with no murmurs noted. Abdomen: Bowel sounds are present. Soft, non-tender.  Extremities: No lower extremity edema.   LABS: Basic Metabolic Panel:  Recent Labs  01/31/15 0426  NA 136  K 3.5  CL 100*  CO2 29  GLUCOSE 106*  BUN 22*  CREATININE 0.86  CALCIUM 8.8*   CBC:  Recent Labs  02/01/15 0510 02/02/15 0450  WBC 11.7* 9.8  NEUTROABS 10.1* 8.1*  HGB 11.1* 10.6*  HCT 32.3* 30.9*  MCV 91.1 91.2  PLT 217 219    Current Meds: . Chlorhexidine Gluconate Cloth  6 each Topical Q0600  . digoxin  0.125 mg Oral Daily  . diltiazem  360 mg Oral Daily  . docusate sodium  100 mg Oral BID  . finasteride  5 mg Oral Daily  . gabapentin  300 mg Oral BID  . Influenza vac split quadrivalent PF  0.5 mL Intramuscular Tomorrow-1000  . levofloxacin (LEVAQUIN) IV  750 mg Intravenous Q24H  . magic mouthwash  5-10 mL Oral QID  . mupirocin ointment  1 application Nasal BID  . pantoprazole  40 mg Oral Daily  . sucralfate  1 g Oral BID  . warfarin  9.5 mg Oral ONCE-1800  . Warfarin - Pharmacist Dosing Inpatient   Does not apply q1800     ASSESSMENT AND PLAN: 74 y/o male with a h/o persistent AF on coumadin and recurrent/residual esophageal cancer being managed with chemotherapy as an outpatient that was admitted  10/10 with fevers, chills, malaise, and RLL PNA that suffered a VT arrest in the setting of hypokalemia/hypomagnesemia on the evening of 10/12. Pt had progressively rapid AF throughout the day on 10/12 and developed VT and loss of consciousness @ 19:20 on the evening of 10/12. He remained in VT for ~ 90 secs and then it broke spontaneously. He did not require defibrillation, antiarrhythmics, or epi.   1. VT Arrest: No further VT. Atrial fib is rate controlled on po Diltiazem in setting of RLL PNA/sepsis.  2. Persistent AFib: Rates now controlled with current dose of po Diltiazem and digoxin.  Coumadin has been on hold due to elevated INR.    3. Esophageal Cancer: Outpt mgmt per heme/onc.  4. RLL PNA/sepsis: Abx per IM/CCM.  5. Essential HTN: BP stable.   6. Elevated troponin:Prev nl Myoview in 2005. LVEF low normal on echo 01/29/15. Suspect demand ischemia. Not currently a good candidate for ischemic w/u.  Stable for discharge from a cardiac standpoint. Plans for rehab.  Will sign off. Please call with questions.      Jeremy Gordon  10/17/20168:24 AM

## 2015-02-02 NOTE — Progress Notes (Signed)
Patient discharged, report called to Nancee Liter at [facility] care center, iv and telemetry removed, reviewed discharge instuctions and home meds, packet ready and given to EMS, EMS arrived for transport

## 2015-02-02 NOTE — Progress Notes (Signed)
I examined the pt - sittting in chair- alert and oriented. Using 2 ltr oxygen via nasal canula- no complains.  Assesment & plan Pneumonia Esophageal cancer V tech and A fib   On Abx, Cx negative   HR under control- INR high     PT suggested Rehab, Pt agreed today.  I spoke to his son Nori Riis on phone- he agrred with the plan of possible d./c today to rehab if insurance authorization is obtained.

## 2015-02-02 NOTE — Clinical Social Work Placement (Signed)
   CLINICAL SOCIAL WORK PLACEMENT  NOTE  Date:  02/02/2015  Patient Details  Name: Jeremy Gordon MRN: [id number] Date of Birth: Sep 06, 1940  Clinical Social Work is seeking post-discharge placement for this patient at the [place] level of care (*CSW will initial, date and re-position this form in  chart as items are completed):  Yes   Patient/family provided with [place] Work Department's list of facilities offering this level of care within the geographic area requested by the patient (or if unable, by the patient's family).  Yes   Patient/family informed of their freedom to choose among providers that offer the needed level of care, that participate in Medicare, Medicaid or managed care program needed by the patient, have an available bed and are willing to accept the patient.  Yes   Patient/family informed of [facility]'s ownership interest in [facility] and [facility], as well as of the fact that they are under no obligation to receive care at these facilities.  PASRR submitted to EDS on 02/02/15     PASRR number received on 02/02/15     Existing PASRR number confirmed on       FL2 transmitted to all facilities in geographic area requested by pt/family on 02/02/15     FL2 transmitted to all facilities within larger geographic area on       Patient informed that his/her managed care company has contracts with or will negotiate with certain facilities, including the following:        Yes   Patient/family informed of bed offers received.  Patient chooses bed at  [facility])     Physician recommends and patient chooses bed at  [facility])    Patient to be transferred to  [facility]) on  .  Patient to be transferred to facility by  (EMS)     Patient family notified on 02/02/15 of transfer.  Name of family member notified:   (brother)     PHYSICIAN       Additional Comment:    _______________________________________________ Shela Leff, LCSW 02/02/2015, 12:25 PM

## 2015-02-02 NOTE — Clinical Social Work Note (Signed)
CSW informed by RN CM that the MD states patient is now agreeable for STR. CSW spoke with patient this morning and he confirmed that he is now agreeable for rehab. Bedsearch initiated. Shela Leff MSW,LCSW [phone]

## 2015-02-02 NOTE — Discharge Summary (Signed)
[facility] [place] NAME: Jeremy Gordon    MR#:  [id number]  DATE OF BIRTH:  1940/06/16  DATE OF ADMISSION:  01/26/2015 ADMITTING PHYSICIAN: Vaughan Basta, MD  DATE OF DISCHARGE: 02/02/2015 \ PRIMARY CARE PHYSICIAN: No PCP Per Patient    ADMISSION DIAGNOSIS:  RUQ abdominal pain [R10.11] Sepsis, due to unspecified organism ([place]) [A41.9] Aspiration pneumonia of right lower lobe, unspecified aspiration pneumonia type ([place]) [J69.0]  DISCHARGE DIAGNOSIS:  Principal Problem:   Aspiration pneumonia of right lower lobe (HCC) Active Problems:   Persistent atrial fibrillation (HCC)   Ventricular tachycardia (HCC)   Sepsis ([place])   Esophageal carcinoma ([place])   Essential hypertension   Hypokalemia   Hypomagnesemia   Atrial fibrillation with RVR ([place])   SECONDARY DIAGNOSIS:   Past Medical History  Diagnosis Date  . Esophageal carcinoma ([place])     a. Chemo: FOLFIRI  . Sleep apnea   . Persistent atrial fibrillation ([place])     a. 07/2013 Echo: EF 60-65%, mod dil LA, mild TR;  CHA2DS2VASc = 3-->chronic coumadin.  . Essential hypertension   . History of stress test     a. 06/2003 MV: EF 56%, no ischemia/infarct.  Marland Kitchen COPD (chronic obstructive pulmonary disease) [facility])     HOSPITAL COURSE:    * Right lower lobe pneumonia with sepsis. Continue Levaquin. Zosyn and Vancomycin were discontinued. Blood culture is negative so far.  * leukopenia with pancytopenia, possibly due to chemotherapy. Improved.   * Atrial fibrillation with RVR. Rate controlled. Continue by mouth Cardizem.INR is , 3.05, no active bleeding. Coumadin was on hold- as it was high up to 4.5. Follow-up INR. Echo: LV EF: 45% -  50% Follow up Dr. Rockey Situ. Decreased dose of coumadin on d/c, may need to recheck in 4-5 days as , its clearance is changing due to antibiotics.  * Esophageal cancer Continue pantoprazole and sucralfate. Per swallowing evaluation, no  dysphagia but has choking with the high risk of aspiration. Aspiration precaution.  * Sleep apnea He does not use his CPAP at home  * Hypokalemia.potassium supplement given and improved. * Hypomagnesemia. Mag given and improved. * Elevated troponin. Possible due to demanding ischemia.  DISCHARGE CONDITIONS:   Stable.  CONSULTS OBTAINED:  Treatment Team:  Demetrios Loll, MD Minna Merritts, MD Lloyd Huger, MD  DRUG ALLERGIES:  No Known Allergies  DISCHARGE MEDICATIONS:   Current Discharge Medication List    START taking these medications   Details  digoxin (LANOXIN) 0.125 MG tablet Take 1 tablet (0.125 mg total) by mouth daily. Qty: 30 tablet, Refills: 0      CONTINUE these medications which have CHANGED   Details  diltiazem (CARDIZEM CD) 360 MG 24 hr capsule Take 1 capsule (360 mg total) by mouth daily. Qty: 30 capsule, Refills: 0    warfarin (COUMADIN) 4 MG tablet Take 2 tablets (8 mg total) by mouth every evening. Qty: 60 tablet, Refills: 0      CONTINUE these medications which have NOT CHANGED   Details  albuterol (PROVENTIL HFA;VENTOLIN HFA) 108 (90 BASE) MCG/ACT inhaler Inhale 2 puffs into the lungs every 4 (four) hours as needed for wheezing or shortness of breath.    enalapril (VASOTEC) 20 MG tablet Take 20 mg by mouth 2 (two) times daily.    finasteride (PROSCAR) 5 MG tablet Take 5 mg by mouth daily.    gabapentin (NEURONTIN) 300 MG capsule Take 300 mg by mouth 2 (two) times  daily.    HYDROcodone-acetaminophen (NORCO) 10-325 MG tablet Take 1 tablet by mouth every 8 (eight) hours as needed for severe pain.    magic mouthwash SOLN Take 5-10 mLs by mouth 4 (four) times daily.    omeprazole (PRILOSEC) 20 MG capsule Take 20 mg by mouth 2 (two) times daily.    sucralfate (CARAFATE) 1 GM/10ML suspension Take 1 g by mouth 2 (two) times daily.      STOP taking these medications     metoprolol (LOPRESSOR) 50 MG tablet      amoxicillin (AMOXIL) 500  MG tablet          DISCHARGE INSTRUCTIONS:    Follow INR in 4-5 days.  If you experience worsening of your admission symptoms, develop shortness of breath, life threatening emergency, suicidal or homicidal thoughts you must seek medical attention immediately by calling 911 or calling your MD immediately  if symptoms less severe.  You Must read complete instructions/literature along with all the possible adverse reactions/side effects for all the Medicines you take and that have been prescribed to you. Take any new Medicines after you have completely understood and accept all the possible adverse reactions/side effects.   Please note  You were cared for by a hospitalist during your hospital stay. If you have any questions about your discharge medications or the care you received while you were in the hospital after you are discharged, you can call the unit and asked to speak with the hospitalist on call if the hospitalist that took care of you is not available. Once you are discharged, your primary care physician will handle any further medical issues. Please note that NO REFILLS for any discharge medications will be authorized once you are discharged, as it is imperative that you return to your primary care physician (or establish a relationship with a primary care physician if you do not have one) for your aftercare needs so that they can reassess your need for medications and monitor your lab values.    Today   CHIEF COMPLAINT:   Chief Complaint  Patient presents with  . Fever  . Dehydration  . Weakness    HISTORY OF PRESENT ILLNESS:  Jeremy Gordon  is a 74 y.o. male with a known history of esophageal cancer on radiation and chemotherapy, hypertension, sleep apnea, atrial fibrillation- since yesterday his feeling gradually more and more weak and last night he could not even get up to go to the bathroom and family noticed he is having fever. They called Dr. Beverly Gust office to  arrange for his next cycle of chemotherapy but he told as he is having fever is better to take him to emergency room rather than bringing him for chemotherapy and they brought him here. History obtained from his sister who is present in the room as patient is confused she told that patient was also complaining of some pain in his right side of abdomen upper quadrant and also complaining of some shortness of breath. In ER on CT scan of abdomen he was noted to the right lower lobe pneumonia.  VITAL SIGNS:  Blood pressure 122/56, pulse 89, temperature 98.6 F (37 C), temperature source Oral, resp. rate 16, height 5\' 7"  (1.702 m), weight 100.426 kg (221 lb 6.4 oz), SpO2 96 %.  I/O:   Intake/Output Summary (Last 24 hours) at 02/02/15 1149 Last data filed at 02/02/15 0520  Gross per 24 hour  Intake      0 ml  Output  950 ml  Net   -950 ml    PHYSICAL EXAMINATION:   GENERAL: 74 y.o.-year-old patient lying in the bed with no acute distress. Obese.  EYES: Pupils equal, round, reactive to light and accommodation. No scleral icterus. Extraocular muscles intact.  HEENT: Head atraumatic, normocephalic. Oropharynx and nasopharynx clear.  NECK: Supple, no jugular venous distention. No thyroid enlargement, no tenderness.  LUNGS: Normal breath sounds bilaterally, no wheezing, but has mild crackles on right base. No use of accessory muscles of respiration.  CARDIOVASCULAR: S1, S2 normal. No murmurs, rubs, or gallops.  ABDOMEN: Soft, nontender, mild distension. Bowel sounds present. No organomegaly or mass.  EXTREMITIES: No pedal edema, cyanosis, or clubbing.  NEUROLOGIC: Cranial nerves II through XII are intact. Muscle strength 4/5 in all extremities. Sensation intact. Gait not checked.  PSYCHIATRIC: The patient is alert and oriented x 3.  SKIN: No obvious rash, lesion, or ulcer.   DATA REVIEW:   CBC  Recent Labs Lab 02/02/15 0450  WBC 9.8  HGB 10.6*  HCT 30.9*  PLT 219     Chemistries   Recent Labs Lab 01/26/15 1249  01/30/15 0451 01/31/15 0426  NA 133*  < > 133* 136  K 3.6  < > 3.3* 3.5  CL 97*  < > 99* 100*  CO2 27  < > 26 29  GLUCOSE 128*  < > 117* 106*  BUN 19  < > 22* 22*  CREATININE 1.08  < > 0.93 0.86  CALCIUM 8.9  < > 8.6* 8.8*  MG  --   < > 1.7  --   AST 23  --   --   --   ALT 15*  --   --   --   ALKPHOS 72  --   --   --   BILITOT 1.7*  --   --   --   < > = values in this interval not displayed.  Cardiac Enzymes  Recent Labs Lab 01/29/15 0702  TROPONINI 0.14*    Microbiology Results  Results for orders placed or performed during the hospital encounter of 01/26/15  Culture, blood (routine x 2)     Status: None   Collection Time: 01/26/15 12:49 PM  Result Value Ref Range Status   Specimen Description BLOOD LEFT ARM  Final   Special Requests BAA, 10CC  Final   Culture NO GROWTH 5 DAYS  Final   Report Status 01/31/2015 FINAL  Final  Culture, blood (routine x 2)     Status: None   Collection Time: 01/26/15  1:52 PM  Result Value Ref Range Status   Specimen Description BLOOD RIGHT ARM  Final   Special Requests BAA, 5CC  Final   Culture NO GROWTH 5 DAYS  Final   Report Status 01/31/2015 FINAL  Final  Urine culture     Status: None   Collection Time: 01/26/15  1:52 PM  Result Value Ref Range Status   Specimen Description URINE, CLEAN CATCH  Final   Special Requests NONE  Final   Culture NO GROWTH 1 DAY  Final   Report Status 01/27/2015 FINAL  Final  MRSA PCR Screening     Status: Abnormal   Collection Time: 01/28/15  7:50 PM  Result Value Ref Range Status   MRSA by PCR POSITIVE (A) NEGATIVE Final    Comment:        The GeneXpert MRSA Assay (FDA approved for NASAL specimens only), is one component of a comprehensive MRSA colonization surveillance  program. It is not intended to diagnose MRSA infection nor to guide or monitor treatment for MRSA infections. READ BACK AND VERIFIED BY MICHELLE WILLIAMS @2237   01/28/15.Marland KitchenMarland KitchenAJO     RADIOLOGY:  No results found.  Management plans discussed with the patient, family and they are in agreement.  CODE STATUS:     Code Status Orders        Start     Ordered   01/26/15 1822  Full code   Continuous     01/26/15 1821      TOTAL TIME TAKING CARE OF THIS PATIENT: 35 minutes.    Vaughan Basta M.D on 02/02/2015 at 11:49 AM  Between 7am to 6pm - Pager - [phone]  After 6pm go to [email or web address] - password [email or web address] [facility]  [facility]  Office  [phone]  CC: Primary care physician; No PCP Per Patient   Note: This dictation was prepared with Dragon dictation along with smaller phrase technology. Any transcriptional errors that result from this process are unintentional.

## 2015-02-02 NOTE — Progress Notes (Signed)
[place] for warfarin Indication: atrial fibrillation  No Known Allergies  Patient Measurements: Height: 5\' 7"  (170.2 cm) (stated) Weight: 221 lb 6.4 oz (100.426 kg) (admission) IBW/kg (Calculated) : 66.1  Vital Signs: Temp: 98.6 F (37 C) (10/17 0514) Temp Source: Oral (10/17 0514) BP: 122/56 mmHg (10/17 0514) Pulse Rate: 89 (10/17 0514)  Labs:  Recent Labs  01/31/15 0426 02/01/15 0510 02/02/15 0450  HGB 11.1* 11.1* 10.6*  HCT 32.3* 32.3* 30.9*  PLT 195 217 219  LABPROT  --  37.3* 31.6*  INR  --  3.78 3.05  CREATININE 0.86  --   --     Estimated Creatinine Clearance: 82.5 mL/min (by C-G formula based on Cr of 0.86).   Medical History: Past Medical History  Diagnosis Date  . Esophageal carcinoma ([place])     a. Chemo: FOLFIRI  . Sleep apnea   . Persistent atrial fibrillation ([place])     a. 07/2013 Echo: EF 60-65%, mod dil LA, mild TR;  CHA2DS2VASc = 3-->chronic coumadin.  . Essential hypertension   . History of stress test     a. 06/2003 MV: EF 56%, no ischemia/infarct.  Marland Kitchen COPD (chronic obstructive pulmonary disease) ([place])      Assessment: Pharmacy consulted today to monitor and dose warfarin in this 74 year old male who has a history of atrial fibrillation. Patient was on warfarin prior to admission with a regimen of 12 mg warfarin daily. Patient is on day 4 of antibiotics - currently on levofloxacin.   Patients INR is just above goal INR at 3.05.    Goal of Therapy:  INR 2-3 Monitor platelets by anticoagulation protocol: Yes   Plan:  Will restart patient on lower dose of warfarin 9.5 mg po daily as INR is within range (~3) and warfarin may take 3 days to start to affect the INR.  This is a 20% reduction in dose compared to home dose of 12 mg po daily.  Patient's INR increased on 10/12.  This increase was most likely due to antibiotic therapy.  Will recheck INR in AM.   Pharmacy will continue to monitor and adjust per  consult.   Murrell Converse, PharmD Clinical Pharmacist 02/02/2015

## 2015-02-02 NOTE — Progress Notes (Signed)
Spoke with Glendell Docker, [facility] rep at [phone], to notify of non-emergent EMS transport.  Auth notification reference given as [id number].   Service date range good from 02/02/15 - 05/03/15.   Gap exception requested to determine if services can be considered at an in-network level.

## 2015-02-02 NOTE — Discharge Instructions (Signed)
Follow INR in 4-5 days to adjust coumadin dose.

## 2015-02-02 NOTE — Clinical Social Work Note (Signed)
Bed offers extended to patient and he has chosen [facility]. Patient's brother was in patient's room at the time and stated he will relay information to the family and is aware patient is to discharge today. Discharge summary sent. Nurse to call report. Patient to transport via EMS. Shela Leff MSW,LCSW [phone]

## 2015-02-11 ENCOUNTER — Other Ambulatory Visit: Payer: Self-pay | Admitting: *Deleted

## 2015-02-11 DIAGNOSIS — C159 Malignant neoplasm of esophagus, unspecified: Secondary | ICD-10-CM

## 2015-02-12 ENCOUNTER — Inpatient Hospital Stay: Payer: Medicare Other | Admitting: Oncology

## 2015-02-12 ENCOUNTER — Inpatient Hospital Stay: Payer: Medicare Other

## 2015-02-16 ENCOUNTER — Inpatient Hospital Stay: Payer: Medicare Other | Attending: Oncology

## 2015-02-16 ENCOUNTER — Inpatient Hospital Stay: Payer: Medicare Other | Admitting: Family Medicine

## 2015-02-16 ENCOUNTER — Telehealth: Payer: Self-pay | Admitting: *Deleted

## 2015-02-16 VITALS — BP 119/67 | HR 102 | Temp 97.0°F | Resp 20

## 2015-02-16 DIAGNOSIS — C159 Malignant neoplasm of esophagus, unspecified: Secondary | ICD-10-CM

## 2015-02-16 DIAGNOSIS — E860 Dehydration: Secondary | ICD-10-CM

## 2015-02-16 DIAGNOSIS — Z79899 Other long term (current) drug therapy: Secondary | ICD-10-CM

## 2015-02-16 LAB — COMPREHENSIVE METABOLIC PANEL
ALK PHOS: 115 U/L (ref 38–126)
ALT: 11 U/L — AB (ref 17–63)
AST: 21 U/L (ref 15–41)
Albumin: 2.6 g/dL — ABNORMAL LOW (ref 3.5–5.0)
Anion gap: 7 (ref 5–15)
BUN: 11 mg/dL (ref 6–20)
CO2: 29 mmol/L (ref 22–32)
CREATININE: 0.87 mg/dL (ref 0.61–1.24)
Calcium: 8.7 mg/dL — ABNORMAL LOW (ref 8.9–10.3)
Chloride: 99 mmol/L — ABNORMAL LOW (ref 101–111)
Glucose, Bld: 120 mg/dL — ABNORMAL HIGH (ref 65–99)
Potassium: 3.7 mmol/L (ref 3.5–5.1)
Sodium: 135 mmol/L (ref 135–145)
Total Bilirubin: 0.7 mg/dL (ref 0.3–1.2)
Total Protein: 6.5 g/dL (ref 6.5–8.1)

## 2015-02-16 LAB — CBC WITH DIFFERENTIAL/PLATELET
Basophils Absolute: 0.1 10*3/uL (ref 0–0.1)
Basophils Relative: 1 %
Eosinophils Absolute: 0.2 10*3/uL (ref 0–0.7)
Eosinophils Relative: 3 %
HCT: 31.8 % — ABNORMAL LOW (ref 40.0–52.0)
HEMOGLOBIN: 10.6 g/dL — AB (ref 13.0–18.0)
LYMPHS ABS: 1.8 10*3/uL (ref 1.0–3.6)
LYMPHS PCT: 28 %
MCH: 30.3 pg (ref 26.0–34.0)
MCHC: 33.3 g/dL (ref 32.0–36.0)
MCV: 90.8 fL (ref 80.0–100.0)
Monocytes Absolute: 0.7 10*3/uL (ref 0.2–1.0)
Monocytes Relative: 10 %
NEUTROS PCT: 58 %
Neutro Abs: 3.8 10*3/uL (ref 1.4–6.5)
Platelets: 196 10*3/uL (ref 150–440)
RBC: 3.5 MIL/uL — AB (ref 4.40–5.90)
RDW: 20.1 % — ABNORMAL HIGH (ref 11.5–14.5)
WBC: 6.5 10*3/uL (ref 3.8–10.6)

## 2015-02-16 MED ORDER — SODIUM CHLORIDE 0.9 % IV SOLN
Freq: Once | INTRAVENOUS | Status: AC
  Administered 2015-02-16: 11:00:00 via INTRAVENOUS
  Filled 2015-02-16: qty 2

## 2015-02-16 MED ORDER — HEPARIN SOD (PORK) LOCK FLUSH 100 UNIT/ML IV SOLN
500.0000 [IU] | Freq: Once | INTRAVENOUS | Status: AC
  Administered 2015-02-16: 500 [IU] via INTRAVENOUS
  Filled 2015-02-16: qty 5

## 2015-02-16 MED ORDER — SODIUM CHLORIDE 0.9 % IV SOLN
INTRAVENOUS | Status: DC
  Filled 2015-02-16: qty 1000

## 2015-02-16 MED ORDER — SODIUM CHLORIDE 0.9 % IJ SOLN
10.0000 mL | INTRAMUSCULAR | Status: DC | PRN
  Administered 2015-02-16: 10 mL via INTRAVENOUS
  Filled 2015-02-16: qty 10

## 2015-02-16 MED ORDER — SODIUM CHLORIDE 0.9 % IV SOLN
Freq: Once | INTRAVENOUS | Status: AC
  Administered 2015-02-16: 11:00:00 via INTRAVENOUS
  Filled 2015-02-16: qty 1000

## 2015-02-16 NOTE — Telephone Encounter (Signed)
Asking to be seen and get IVF. "Just don't feel good" Will go to [facility], agrees to be there by 1030

## 2015-02-16 NOTE — Progress Notes (Deleted)
[place]  [phone]) [phone]  Fax:(336) [place] DOB: 1940/08/09  MR#: [id number]  [place]O#:[id number]  Patient Care Team: No Pcp Per Patient as PCP - General (General Practice) No Pcp Per Patient (General Practice)  CHIEF COMPLAINT: No chief complaint on file.   INTERVAL HISTORY: ***  REVIEW OF SYSTEMS:   ROS  As per HPI. Otherwise, a complete review of systems is negatve.  ONCOLOGY HISTORY:  No history exists.    PAST MEDICAL HISTORY: Past Medical History  Diagnosis Date  . 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 Chemo: FOLFIRI  . Sleep apnea   . Persistent atrial fibrillation ([place])     a. 07/2013 Echo: EF 60-65%, mod dil LA, mild TR;  CHA2DS2VASc = 3-->chronic coumadin.  . Essential hypertension   . History of stress test     a. 06/2003 MV: EF 56%, no ischemia/infarct.  Marland Kitchen COPD (chronic obstructive pulmonary disease) ([place])     PAST SURGICAL HISTORY: Past Surgical History  Procedure Laterality Date  . Peripheral vascular catheterization N/A 11/20/2014    Procedure: Glori Luis Cath Insertion;  Surgeon: Algernon Huxley, MD;  Location: [facility];  Service: Cardiovascular;  Laterality: N/A;    FAMILY HISTORY Family History  Problem Relation Age of Onset  . Hypertension Sister   . Hypertension Brother     GYNECOLOGIC HISTORY:  No LMP for male patient.     ADVANCED DIRECTIVES:    HEALTH MAINTENANCE: Social History  Substance Use Topics  . Smoking status: Former Smoker -- 28 years  . Smokeless tobacco: Current User    Types: Snuff  . Alcohol Use: No     Colonoscopy:  PAP:  Bone density:  Lipid panel:  No Known Allergies  Current Outpatient Prescriptions  Medication Sig Dispense Refill  . albuterol (PROVENTIL HFA;VENTOLIN HFA) 108 (90 BASE) MCG/ACT inhaler Inhale 2 puffs into the lungs every 4 (four) hours as needed for wheezing or shortness of breath.    . digoxin (LANOXIN) 0.125 MG tablet Take 1 tablet (0.125 mg  total) by mouth daily. 30 tablet 0  . diltiazem (CARDIZEM CD) 360 MG 24 hr capsule Take 1 capsule (360 mg total) by mouth daily. 30 capsule 0  . enalapril (VASOTEC) 20 MG tablet Take 20 mg by mouth 2 (two) times daily.    . finasteride (PROSCAR) 5 MG tablet Take 5 mg by mouth daily.    Marland Kitchen gabapentin (NEURONTIN) 300 MG capsule Take 300 mg by mouth 2 (two) times daily.    Marland Kitchen HYDROcodone-acetaminophen (NORCO) 10-325 MG tablet Take 1 tablet by mouth every 6 (six) hours as needed for severe pain. 20 tablet 0  . levofloxacin (LEVAQUIN) 500 MG tablet Take 1 tablet (500 mg total) by mouth daily. 2 tablet 0  . magic mouthwash SOLN Take 5-10 mLs by mouth 4 (four) times daily.    Marland Kitchen omeprazole (PRILOSEC) 20 MG capsule Take 20 mg by mouth 2 (two) times daily.    . sucralfate (CARAFATE) 1 GM/10ML suspension Take 1 g by mouth 2 (two) times daily.    Marland Kitchen warfarin (COUMADIN) 4 MG tablet Take 2 tablets (8 mg total) by mouth every evening. 60 tablet 0   Current Facility-Administered Medications  Medication Dose Route Frequency Provider Last Rate Last Dose  . 0.9 %  sodium chloride infusion   Intravenous Once Evlyn Kanner, NP      . ondansetron (ZOFRAN) 4 mg, dexamethasone (DECADRON) 10 mg in  sodium chloride 0.9 % 50 mL IVPB   Intravenous Once Evlyn Kanner, NP       Facility-Administered Medications Ordered in Other Visits  Medication Dose Route Frequency Provider Last Rate Last Dose  . 0.9 %  sodium chloride infusion   Intravenous Once Lloyd Huger, MD      . 0.9 %  sodium chloride infusion   Intravenous Continuous Lloyd Huger, MD      . sodium chloride 0.9 % injection 10 mL  10 mL Intravenous PRN Lloyd Huger, MD   10 mL at 12/23/14 0945  . sodium chloride 0.9 % injection 10 mL  10 mL Intracatheter PRN Lloyd Huger, MD   10 mL at 12/30/14 0947  . sodium chloride 0.9 % injection 10 mL  10 mL Intravenous PRN Lloyd Huger, MD   10 mL at 01/15/15 1512    OBJECTIVE: There were  no vitals taken for this visit.   There is no weight on file to calculate BMI.    ECOG FS:{CHL ONC [id number]  General: Well-developed, well-nourished, no acute distress. Eyes: Pink conjunctiva, anicteric sclera. HEENT: Normocephalic, moist mucous membranes, clear oropharnyx. Lungs: Clear to auscultation bilaterally. Heart: Regular rate and rhythm. No rubs, murmurs, or gallops. Abdomen: Soft, nontender, nondistended. No organomegaly noted, normoactive bowel sounds. Breast: Breast palpated in a circular manner in the sitting and supine positions.  No masses or fullness palpated.  Axilla palpated in both positions with no masses or fullness palpated.  Musculoskeletal: No edema, cyanosis, or clubbing. Neuro: Alert, answering all questions appropriately. Cranial nerves grossly intact. Skin: No rashes or petechiae noted. Psych: Normal affect. Lymphatics: No cervical, calvicular, axillary or inguinal LAD.   LAB RESULTS:  Infusion on 02/16/2015  Component Date Value Ref Range Status  . WBC 02/16/2015 6.5  3.8 - 10.6 K/uL Final  . RBC 02/16/2015 3.50* 4.40 - 5.90 MIL/uL Final  . Hemoglobin 02/16/2015 10.6* 13.0 - 18.0 g/dL Final  . HCT 02/16/2015 31.8* 40.0 - 52.0 % Final  . MCV 02/16/2015 90.8  80.0 - 100.0 fL Final  . MCH 02/16/2015 30.3  26.0 - 34.0 pg Final  . MCHC 02/16/2015 33.3  32.0 - 36.0 g/dL Final  . RDW 02/16/2015 20.1* 11.5 - 14.5 % Final  . Platelets 02/16/2015 196  150 - 440 K/uL Final  . Neutrophils Relative % 02/16/2015 58   Final  . Neutro Abs 02/16/2015 3.8  1.4 - 6.5 K/uL Final  . Lymphocytes Relative 02/16/2015 28   Final  . Lymphs Abs 02/16/2015 1.8  1.0 - 3.6 K/uL Final  . Monocytes Relative 02/16/2015 10   Final  . Monocytes Absolute 02/16/2015 0.7  0.2 - 1.0 K/uL Final  . Eosinophils Relative 02/16/2015 3   Final  . Eosinophils Absolute 02/16/2015 0.2  0 - 0.7 K/uL Final  . Basophils Relative 02/16/2015 1   Final  . Basophils Absolute 02/16/2015 0.1  0 - 0.1  K/uL Final    STUDIES: No results found.  ASSESSMENT:   PLAN:  No problem-specific assessment & plan notes found for this encounter.   Patient expressed understanding and was in agreement with this plan. He also understands that He can call clinic at any time with any questions, concerns, or complaints.   Dr. Oliva Bustard was available for consultation and review of plan of care for this patient.  No matching staging information was found for the patient.  Evlyn Kanner, NP   02/16/2015 11:03 AM

## 2015-02-16 NOTE — Progress Notes (Signed)
Pt states he went to hospital recently for pneumonia. Then he went to rehab. He said they didn't do nothing for me. I laid in bed all day long. He got his nights and days mixed up. Since going home he has felt weak with zero energy. Has mild nausea at times. He gets some "acid/heartburn in his upper abd after eating. Bowels move every day. Voids extra good. No edema. Mild dyspnea with exertion. No coughing noted. He was given IV hydration with NS 1 liter and medicated with IV zofran and decadron in clinic this am.

## 2015-02-18 NOTE — Progress Notes (Signed)
This encounter was created in error - please disregard.

## 2015-02-20 ENCOUNTER — Inpatient Hospital Stay: Payer: Medicare Other | Attending: Oncology | Admitting: Oncology

## 2015-02-20 VITALS — BP 113/80 | HR 88 | Temp 95.8°F | Wt 218.5 lb

## 2015-02-20 DIAGNOSIS — I4891 Unspecified atrial fibrillation: Secondary | ICD-10-CM | POA: Insufficient documentation

## 2015-02-20 DIAGNOSIS — I517 Cardiomegaly: Secondary | ICD-10-CM | POA: Diagnosis not present

## 2015-02-20 DIAGNOSIS — Z87891 Personal history of nicotine dependence: Secondary | ICD-10-CM | POA: Diagnosis not present

## 2015-02-20 DIAGNOSIS — R1310 Dysphagia, unspecified: Secondary | ICD-10-CM | POA: Diagnosis not present

## 2015-02-20 DIAGNOSIS — Z79899 Other long term (current) drug therapy: Secondary | ICD-10-CM

## 2015-02-20 DIAGNOSIS — R531 Weakness: Secondary | ICD-10-CM | POA: Insufficient documentation

## 2015-02-20 DIAGNOSIS — Z7901 Long term (current) use of anticoagulants: Secondary | ICD-10-CM | POA: Insufficient documentation

## 2015-02-20 DIAGNOSIS — R5383 Other fatigue: Secondary | ICD-10-CM | POA: Diagnosis not present

## 2015-02-20 DIAGNOSIS — Z5111 Encounter for antineoplastic chemotherapy: Secondary | ICD-10-CM | POA: Diagnosis present

## 2015-02-20 DIAGNOSIS — M4322 Fusion of spine, cervical region: Secondary | ICD-10-CM | POA: Insufficient documentation

## 2015-02-20 DIAGNOSIS — J9811 Atelectasis: Secondary | ICD-10-CM | POA: Diagnosis not present

## 2015-02-20 DIAGNOSIS — C155 Malignant neoplasm of lower third of esophagus: Secondary | ICD-10-CM

## 2015-02-20 DIAGNOSIS — I6522 Occlusion and stenosis of left carotid artery: Secondary | ICD-10-CM | POA: Insufficient documentation

## 2015-02-20 DIAGNOSIS — R918 Other nonspecific abnormal finding of lung field: Secondary | ICD-10-CM | POA: Insufficient documentation

## 2015-02-20 DIAGNOSIS — I10 Essential (primary) hypertension: Secondary | ICD-10-CM | POA: Insufficient documentation

## 2015-02-20 DIAGNOSIS — C159 Malignant neoplasm of esophagus, unspecified: Secondary | ICD-10-CM | POA: Insufficient documentation

## 2015-02-20 DIAGNOSIS — G4730 Sleep apnea, unspecified: Secondary | ICD-10-CM | POA: Insufficient documentation

## 2015-02-20 DIAGNOSIS — R634 Abnormal weight loss: Secondary | ICD-10-CM | POA: Insufficient documentation

## 2015-02-20 MED ORDER — SODIUM CHLORIDE 0.9 % IV SOLN
Freq: Once | INTRAVENOUS | Status: AC
  Administered 2015-02-20: 10:00:00 via INTRAVENOUS
  Filled 2015-02-20: qty 1000

## 2015-02-20 MED ORDER — HEPARIN SOD (PORK) LOCK FLUSH 100 UNIT/ML IV SOLN
500.0000 [IU] | Freq: Once | INTRAVENOUS | Status: AC
  Administered 2015-02-20: 500 [IU] via INTRAVENOUS
  Filled 2015-02-20: qty 5

## 2015-02-20 MED ORDER — SODIUM CHLORIDE 0.9 % IJ SOLN
10.0000 mL | INTRAMUSCULAR | Status: DC | PRN
  Administered 2015-02-20: 10 mL
  Filled 2015-02-20: qty 10

## 2015-02-25 ENCOUNTER — Ambulatory Visit
Admission: RE | Admit: 2015-02-25 | Discharge: 2015-02-25 | Disposition: A | Discharge status: Home / Self Care | Payer: Medicare Other | Source: Ambulatory Visit | Attending: Oncology | Admitting: Oncology

## 2015-02-25 DIAGNOSIS — J189 Pneumonia, unspecified organism: Secondary | ICD-10-CM | POA: Insufficient documentation

## 2015-02-25 DIAGNOSIS — C155 Malignant neoplasm of lower third of esophagus: Secondary | ICD-10-CM | POA: Diagnosis not present

## 2015-02-25 MED ORDER — IOHEXOL 350 MG/ML SOLN
75.0000 mL | Freq: Once | INTRAVENOUS | Status: AC | PRN
  Administered 2015-02-25: 75 mL via INTRAVENOUS

## 2015-02-27 ENCOUNTER — Inpatient Hospital Stay: Payer: Medicare Other

## 2015-02-27 ENCOUNTER — Inpatient Hospital Stay (HOSPITAL_BASED_OUTPATIENT_CLINIC_OR_DEPARTMENT_OTHER): Payer: Medicare Other | Admitting: Oncology

## 2015-02-27 VITALS — BP 112/57 | HR 86 | Temp 96.1°F | Resp 20

## 2015-02-27 DIAGNOSIS — I6522 Occlusion and stenosis of left carotid artery: Secondary | ICD-10-CM

## 2015-02-27 DIAGNOSIS — C159 Malignant neoplasm of esophagus, unspecified: Secondary | ICD-10-CM

## 2015-02-27 DIAGNOSIS — R634 Abnormal weight loss: Secondary | ICD-10-CM

## 2015-02-27 DIAGNOSIS — I4891 Unspecified atrial fibrillation: Secondary | ICD-10-CM

## 2015-02-27 DIAGNOSIS — R1310 Dysphagia, unspecified: Secondary | ICD-10-CM

## 2015-02-27 DIAGNOSIS — J9811 Atelectasis: Secondary | ICD-10-CM | POA: Diagnosis not present

## 2015-02-27 DIAGNOSIS — R918 Other nonspecific abnormal finding of lung field: Secondary | ICD-10-CM

## 2015-02-27 DIAGNOSIS — G4730 Sleep apnea, unspecified: Secondary | ICD-10-CM

## 2015-02-27 DIAGNOSIS — R531 Weakness: Secondary | ICD-10-CM

## 2015-02-27 DIAGNOSIS — Z5111 Encounter for antineoplastic chemotherapy: Secondary | ICD-10-CM | POA: Diagnosis not present

## 2015-02-27 DIAGNOSIS — Z79899 Other long term (current) drug therapy: Secondary | ICD-10-CM

## 2015-02-27 DIAGNOSIS — Z87891 Personal history of nicotine dependence: Secondary | ICD-10-CM

## 2015-02-27 DIAGNOSIS — Z7901 Long term (current) use of anticoagulants: Secondary | ICD-10-CM

## 2015-02-27 DIAGNOSIS — I10 Essential (primary) hypertension: Secondary | ICD-10-CM

## 2015-02-27 DIAGNOSIS — R5383 Other fatigue: Secondary | ICD-10-CM

## 2015-02-27 DIAGNOSIS — M4322 Fusion of spine, cervical region: Secondary | ICD-10-CM

## 2015-02-27 DIAGNOSIS — I517 Cardiomegaly: Secondary | ICD-10-CM

## 2015-02-27 LAB — CBC WITH DIFFERENTIAL/PLATELET
BASOS ABS: 0.1 10*3/uL (ref 0–0.1)
Basophils Relative: 1 %
Eosinophils Absolute: 0.2 10*3/uL (ref 0–0.7)
Eosinophils Relative: 3 %
HEMATOCRIT: 34.6 % — AB (ref 40.0–52.0)
Hemoglobin: 11.4 g/dL — ABNORMAL LOW (ref 13.0–18.0)
LYMPHS PCT: 19 %
Lymphs Abs: 1.5 10*3/uL (ref 1.0–3.6)
MCH: 30.2 pg (ref 26.0–34.0)
MCHC: 33 g/dL (ref 32.0–36.0)
MCV: 91.3 fL (ref 80.0–100.0)
MONO ABS: 0.7 10*3/uL (ref 0.2–1.0)
MONOS PCT: 9 %
NEUTROS ABS: 5.3 10*3/uL (ref 1.4–6.5)
Neutrophils Relative %: 68 %
Platelets: 199 10*3/uL (ref 150–440)
RBC: 3.79 MIL/uL — ABNORMAL LOW (ref 4.40–5.90)
RDW: 19.3 % — AB (ref 11.5–14.5)
WBC: 7.7 10*3/uL (ref 3.8–10.6)

## 2015-02-27 LAB — COMPREHENSIVE METABOLIC PANEL
ALT: 13 U/L — ABNORMAL LOW (ref 17–63)
AST: 23 U/L (ref 15–41)
Albumin: 2.9 g/dL — ABNORMAL LOW (ref 3.5–5.0)
Alkaline Phosphatase: 97 U/L (ref 38–126)
Anion gap: 8 (ref 5–15)
BILIRUBIN TOTAL: 0.5 mg/dL (ref 0.3–1.2)
BUN: 17 mg/dL (ref 6–20)
CALCIUM: 8.6 mg/dL — AB (ref 8.9–10.3)
CO2: 27 mmol/L (ref 22–32)
Chloride: 100 mmol/L — ABNORMAL LOW (ref 101–111)
Creatinine, Ser: 0.88 mg/dL (ref 0.61–1.24)
GFR calc Af Amer: >60 mL/min (ref >60)
Glucose, Bld: 123 mg/dL — ABNORMAL HIGH (ref 65–99)
POTASSIUM: 3.9 mmol/L (ref 3.5–5.1)
Sodium: 135 mmol/L (ref 135–145)
TOTAL PROTEIN: 6.7 g/dL (ref 6.5–8.1)

## 2015-02-27 MED ORDER — HEPARIN SOD (PORK) LOCK FLUSH 100 UNIT/ML IV SOLN: 500.0000 [IU] | Freq: Once | INTRAVENOUS | Status: DC | PRN

## 2015-02-27 MED ORDER — SODIUM CHLORIDE 0.9 % IV SOLN
2400.0000 mg/m2 | INTRAVENOUS | Status: DC
  Administered 2015-02-27: 5400 mg via INTRAVENOUS
  Filled 2015-02-27: qty 108

## 2015-02-27 MED ORDER — SODIUM CHLORIDE 0.9 % IV SOLN
Freq: Once | INTRAVENOUS | Status: AC
  Administered 2015-02-27: 11:00:00 via INTRAVENOUS
  Filled 2015-02-27: qty 8

## 2015-02-27 MED ORDER — IRINOTECAN HCL CHEMO INJECTION 100 MG/5ML
400.0000 mg | Freq: Once | INTRAVENOUS | Status: AC
  Administered 2015-02-27: 400 mg via INTRAVENOUS
  Filled 2015-02-27: qty 20

## 2015-02-27 MED ORDER — FLUOROURACIL CHEMO INJECTION 2.5 GM/50ML
400.0000 mg/m2 | Freq: Once | INTRAVENOUS | Status: AC
  Administered 2015-02-27: 900 mg via INTRAVENOUS
  Filled 2015-02-27: qty 18

## 2015-02-27 MED ORDER — ATROPINE SULFATE 0.4 MG/ML IJ SOLN
0.4000 mg | Freq: Once | INTRAMUSCULAR | Status: AC | PRN
  Administered 2015-02-27: 0.4 mg via INTRAVENOUS
  Filled 2015-02-27: qty 1

## 2015-02-27 MED ORDER — LEUCOVORIN CALCIUM INJECTION 350 MG
400.0000 mg/m2 | Freq: Once | INTRAVENOUS | Status: AC
  Administered 2015-02-27: 900 mg via INTRAVENOUS
  Filled 2015-02-27: qty 45

## 2015-02-27 MED ORDER — SODIUM CHLORIDE 0.9 % IV SOLN
Freq: Once | INTRAVENOUS | Status: AC
  Administered 2015-02-27: 11:00:00 via INTRAVENOUS
  Filled 2015-02-27: qty 1000

## 2015-02-27 NOTE — Progress Notes (Signed)
[place]  Telephone:(336) [phone] Fax:(336) [phone]  ID: Jeremy Gordon OB: [birth year]  MR#: [id number]  KCL#:[id number]  Patient Care Team: [name], MD as PCP - General (Geriatric Medicine) No Pcp Per Patient (General Practice)  CHIEF COMPLAINT:  Chief Complaint  Patient presents with  . Esophageal Cancer    follow up    INTERVAL HISTORY: Patient returns to clinic today for further evaluation and consideration of cycle 4 of reFinitiating chemotherapy.  His strength is improving and he is nearly back to his baseline.  He has weakness and fatigue.  He continues to have occasional difficulty swallowing. He otherwise feels well. He has no neurologic complaints. He denies any recent fevers. He denies any chest pain, shortness of breath, or cough. He denies any nausea, vomiting, constipation, or diarrhea. He has no melena or hematochezia. He has no urinary complaints. Patient offers no further specific complaints today.  REVIEW OF SYSTEMS:   Review of Systems  Constitutional: Positive for weight loss. Negative for fever.  Respiratory: Negative.   Cardiovascular: Negative.   Gastrointestinal: Negative.   Musculoskeletal: Negative.   Neurological: Positive for weakness.  Endo/Heme/Allergies: Does not bruise/bleed easily.    As per HPI. Otherwise, a complete review of systems is negatve.  PAST MEDICAL HISTORY:  Hypertension, sleep apnea, atrial fibrillation.  PAST SURGICAL HISTORY: Negative.  FAMILY HISTORY: Reviewed and unchanged. No report of malignancy or chronic disease.     ADVANCED DIRECTIVES:    HEALTH MAINTENANCE: Social History  Substance Use Topics  . Smoking status: Former Smoker -- 25 years  . Smokeless tobacco: Current User    Types: Snuff  . Alcohol Use: No     Colonoscopy:  PAP:  Bone density:  Lipid panel:  No Known Allergies  Current Outpatient Prescriptions  Medication Sig Dispense Refill  . albuterol (PROVENTIL  HFA;VENTOLIN HFA) 108 (90 BASE) MCG/ACT inhaler Inhale 2 puffs into the lungs every 4 (four) hours as needed for wheezing or shortness of breath.    . digoxin (LANOXIN) 0.125 MG tablet Take 1 tablet (0.125 mg total) by mouth daily. 30 tablet 0  . diltiazem (CARDIZEM CD) 360 MG 24 hr capsule Take 1 capsule (360 mg total) by mouth daily. 30 capsule 0  . enalapril (VASOTEC) 20 MG tablet Take 20 mg by mouth 2 (two) times daily.    . finasteride (PROSCAR) 5 MG tablet Take 5 mg by mouth daily.    Marland Kitchen gabapentin (NEURONTIN) 300 MG capsule Take 300 mg by mouth 2 (two) times daily.    Marland Kitchen HYDROcodone-acetaminophen (NORCO) 10-325 MG tablet Take 1 tablet by mouth every 6 (six) hours as needed for severe pain. 20 tablet 0  . levofloxacin (LEVAQUIN) 500 MG tablet Take 1 tablet (500 mg total) by mouth daily. 2 tablet 0  . magic mouthwash SOLN Take 5-10 mLs by mouth 4 (four) times daily.    Marland Kitchen omeprazole (PRILOSEC) 20 MG capsule Take 20 mg by mouth 2 (two) times daily.    . sucralfate (CARAFATE) 1 GM/10ML suspension Take 1 g by mouth 2 (two) times daily.    Marland Kitchen warfarin (COUMADIN) 4 MG tablet Take 2 tablets (8 mg total) by mouth every evening. 60 tablet 0   Current Facility-Administered Medications  Medication Dose Route Frequency Provider Last Rate Last Dose  . sodium chloride 0.9 % injection 10 mL  10 mL Intracatheter PRN Lloyd Huger, MD   10 mL at 02/20/15 4944   Facility-Administered Medications Ordered in Other Visits  Medication Dose Route Frequency Provider Last Rate Last Dose  . 0.9 %  sodium chloride infusion   Intravenous Once Lloyd Huger, MD      . sodium chloride 0.9 % injection 10 mL  10 mL Intravenous PRN Lloyd Huger, MD   10 mL at 12/23/14 0945  . sodium chloride 0.9 % injection 10 mL  10 mL Intracatheter PRN Lloyd Huger, MD   10 mL at 12/30/14 0947  . sodium chloride 0.9 % injection 10 mL  10 mL Intravenous PRN Lloyd Huger, MD   10 mL at 01/15/15 1512     OBJECTIVE: Filed Vitals:   02/20/15 0901  BP: 113/80  Pulse: 88  Temp: 95.8 F (35.4 C)     Body mass index is 34.21 kg/(m^2).    ECOG FS:1 - Symptomatic but completely ambulatory  General: Well-developed, well-nourished, no acute distress. Eyes: anicteric sclera. Lungs: Clear to auscultation bilaterally. Heart: Regular rate and rhythm. No rubs, murmurs, or gallops. Abdomen: Soft, nontender, nondistended. No organomegaly noted, normoactive bowel sounds. Musculoskeletal: No edema, cyanosis, or clubbing. Neuro: Alert, answering all questions appropriately. Cranial nerves grossly intact. Skin: No rashes or petechiae noted. Psych: Normal affect.   LAB RESULTS:  Lab Results  Component Value Date   NA 135 02/16/2015   K 3.7 02/16/2015   CL 99* 02/16/2015   CO2 29 02/16/2015   GLUCOSE 120* 02/16/2015   BUN 11 02/16/2015   CREATININE 0.87 02/16/2015   CALCIUM 8.7* 02/16/2015   PROT 6.5 02/16/2015   ALBUMIN 2.6* 02/16/2015   AST 21 02/16/2015   ALT 11* 02/16/2015   ALKPHOS 115 02/16/2015   BILITOT 0.7 02/16/2015   GFRNONAA >60 02/16/2015   GFRAA >60 02/16/2015    Lab Results  Component Value Date   WBC 6.5 02/16/2015   NEUTROABS 3.8 02/16/2015   HGB 10.6* 02/16/2015   HCT 31.8* 02/16/2015   MCV 90.8 02/16/2015   PLT 196 02/16/2015     STUDIES: Ct Soft Tissue Neck W Contrast  02/25/2015  CLINICAL DATA:  Restaging esophageal cancer. Chemotherapy 2-3 weeks ago. EXAM: CT NECK WITH CONTRAST TECHNIQUE: Multidetector CT imaging of the neck was performed using the standard protocol following the bolus administration of intravenous contrast. CONTRAST:  91m OMNIPAQUE IOHEXOL 350 MG/ML SOLN COMPARISON:  11/10/2014 FINDINGS: Pharynx and larynx: The nasopharynx, oropharynx, oral cavity, hypopharynx, and larynx are unremarkable. The patient is edentulous. Salivary glands: No submandibular or parotid gland mass or inflammation. Thyroid: Unremarkable. Lymph nodes: No enlarged or  suspicious upper cervical lymph nodes. Level IV/supraclavicular lymph nodes have mildly increased in size from the prior study and measure up to 9 mm in short axis on the right (series 10, image 75, previously 6 mm) and 10 mm on the left (series 10, image 72, previously 6 mm). Vascular: Prominent atherosclerotic calcification about the carotid bifurcations with mild-to-moderate proximal left ICA stenosis. Right jugular Port-A-Cath partially visualized. Limited intracranial: The visualized portion of the brain is unremarkable. Visualized orbits: Unremarkable. Mastoids and visualized paranasal sinuses: Visualized paranasal sinuses are clear. Mastoid air cells are underpneumatized. There is chronic opacification of the left epitympanum and left mastoid antrum with surrounding sclerosis. Skeleton: Moderate multilevel cervical disc and facet degeneration. Left facet ankylosis at C5-6. No suspicious lytic or blastic osseous lesions are identified. Upper chest: Evaluated on concurrent dedicated chest CT. IMPRESSION: Increased size of bilateral level IV/supraclavicular lymph nodes, measuring up to 10 mm in short axis. These are indeterminate but suspicious for metastatic disease.  Electronically Signed   By: Logan Bores M.D.   On: 02/25/2015 16:31   Ct Chest W Contrast  02/25/2015  CLINICAL DATA:  Subsequent encounter for esophageal cancer restaging. EXAM: CT CHEST WITH CONTRAST TECHNIQUE: Multidetector CT imaging of the chest was performed during intravenous contrast administration. CONTRAST:  24m OMNIPAQUE IOHEXOL 350 MG/ML SOLN COMPARISON:  11/10/2014. FINDINGS: Mediastinum / Lymph Nodes: There is no axillary lymphadenopathy. Right-sided central line tip projects at the distal SVC. The right paratracheal lymph node measured previously at 7 mm short axis is now 10 mm short axis. 7 mm short axis subcarinal lymph node measured previously is now 9 mm. No left hilar lymphadenopathy. Focal airspace consolidation in the  posterior right lung base tracks up into the right hilum. Heart is enlarged. Circumferential wall thickening in the distal esophagus has decreased in the interval. Wall thickness was measured previously at 2.1 cm and has decreased to about 1.0 cm on the current study. Lungs / Pleura: Lung windows show interval development of confluent airspace consolidation in the right lower lobe without substantial volume loss. There is some atelectasis in the dependent left lower lobe. No discrete pulmonary parenchymal nodule or mass. Upper Abdomen: No focal abnormality seen within the visualized portions of the liver or spleen. Pancreas and right adrenal gland are unremarkable. 13 mm left adrenal nodule cannot be definitively characterize. MSK / Soft Tissues: Bone windows reveal no worrisome lytic or sclerotic osseous lesions. IMPRESSION: 1. Right lower lobe airspace disease has become much more confluent since the disease seen on the abdomen and pelvis CT of 01/26/2015. Imaging features remain compatible with pneumonia. 2. Interval decrease and wall thickness of the distal esophagus. 3. Small mediastinal lymph nodes have progressed minimally in the interval. Continued close attention recommended. Electronically Signed   By: EMisty StanleyM.D.   On: 02/25/2015 11:46   Dg Chest Port 1 View  01/28/2015  CLINICAL DATA:  74year old male with shortness of breath and cough. Initial encounter. EXAM: PORTABLE CHEST 1 VIEW COMPARISON:  01/26/2015 and earlier. FINDINGS: Portable AP upright view at 2120 hours. Pacing pads project over the left chest. Stable right chest porta cath. Increasing patchy right lung base and more confluent retrocardiac opacity, but no pneumothorax, pleural effusion or pulmonary edema. Stable cardiac size and mediastinal contours. IMPRESSION: New lung base opacity greater on the right could reflect atelectasis, aspiration or pneumonia. No pleural effusion. Electronically Signed   By: HGenevie AnnM.D.   On:  01/28/2015 21:33    ASSESSMENT: Recurrent adenocarcinoma of the esophagus, HER-2 negative.  PLAN:    1. Esophageal cancer: CT results from February 25, 2015 reviewed independently and reported as above with slight improved of disease.  Patient expresses interest in reinitiating treatments, therefore he will return to clinic in 1 week for his next cycle of FOLFIRI.  Plan to give treatment every 2 weeks with reimaging in approximately February 2017. PEG tube has also been discussed to help improve his nutrition, but patient continues to refuse this procedure.  Will also continue to use Neulasta with his treatments.  2. Atrial fibrillation: Continue Coumadin as prescribed. INRs are monitored by his primary care physician. 3. Thrombocytopenia: Resolved. 4. Difficulty swallowing: Secondary to persistent malignancy. Patient has refused PEG tube placement. 5. Nosebleed: Resolved.  6. Weakness: Patient will receive one liter IVF today.  Patient expressed understanding and was in agreement with this plan. He also understands that He can call clinic at any time with any questions, concerns, or complaints.  Lloyd Huger, MD   02/27/2015 5:56 AM

## 2015-03-01 ENCOUNTER — Ambulatory Visit: Payer: Medicare Other

## 2015-03-01 DIAGNOSIS — Z5111 Encounter for antineoplastic chemotherapy: Secondary | ICD-10-CM | POA: Diagnosis not present

## 2015-03-02 MED ORDER — HEPARIN SOD (PORK) LOCK FLUSH 100 UNIT/ML IV SOLN
500.0000 [IU] | Freq: Once | INTRAVENOUS | Status: AC | PRN
  Administered 2015-03-01: 500 [IU]

## 2015-03-02 MED ORDER — SODIUM CHLORIDE 0.9 % IJ SOLN
10.0000 mL | INTRAMUSCULAR | Status: DC | PRN
  Administered 2015-03-01: 10 mL
  Filled 2015-03-02: qty 10

## 2015-03-03 ENCOUNTER — Inpatient Hospital Stay: Payer: Medicare Other

## 2015-03-03 ENCOUNTER — Other Ambulatory Visit: Payer: Self-pay | Admitting: *Deleted

## 2015-03-03 VITALS — BP 94/51 | HR 52 | Temp 97.2°F | Resp 20

## 2015-03-03 DIAGNOSIS — C159 Malignant neoplasm of esophagus, unspecified: Secondary | ICD-10-CM

## 2015-03-03 DIAGNOSIS — Z5111 Encounter for antineoplastic chemotherapy: Secondary | ICD-10-CM | POA: Diagnosis not present

## 2015-03-03 MED ORDER — SODIUM CHLORIDE 0.9 % IV SOLN
Freq: Once | INTRAVENOUS | Status: AC
  Administered 2015-03-03: 11:00:00 via INTRAVENOUS
  Filled 2015-03-03: qty 1000

## 2015-03-03 MED ORDER — ONDANSETRON HCL 4 MG/2ML IJ SOLN
INTRAMUSCULAR | Status: AC
  Filled 2015-03-03: qty 4

## 2015-03-03 MED ORDER — DEXAMETHASONE SODIUM PHOSPHATE 10 MG/ML IJ SOLN
INTRAMUSCULAR | Status: AC
  Filled 2015-03-03: qty 1

## 2015-03-03 MED ORDER — HEPARIN SOD (PORK) LOCK FLUSH 100 UNIT/ML IV SOLN
500.0000 [IU] | Freq: Once | INTRAVENOUS | Status: AC
  Administered 2015-03-03: 500 [IU] via INTRAVENOUS
  Filled 2015-03-03: qty 5

## 2015-03-03 MED ORDER — SODIUM CHLORIDE 0.9 % IV SOLN
Freq: Once | INTRAVENOUS | Status: AC
  Administered 2015-03-03: 11:00:00 via INTRAVENOUS
  Filled 2015-03-03: qty 4

## 2015-03-03 MED ORDER — SODIUM CHLORIDE 0.9 % IJ SOLN
10.0000 mL | INTRAMUSCULAR | Status: DC | PRN
  Administered 2015-03-03: 10 mL via INTRAVENOUS
  Filled 2015-03-03: qty 10

## 2015-03-16 NOTE — Progress Notes (Signed)
[facility]  [phone] [phone]  ID: [name] OB: 1941-02-17  MR#: [id number]  QIO#:[id number]  Patient Care Team: [name], MD as PCP - General (Geriatric Medicine) No Pcp Per Patient (General Practice)  CHIEF COMPLAINT:  No chief complaint on file.   INTERVAL HISTORY: Patient returns to clinic today for further evaluation and consideration of cycle 1 of FOLFIRI.  He continues to have mild weakness and fatigue.  He continues to have occasional difficulty swallowing. He otherwise feels well. He has no neurologic complaints. He denies any recent fevers. He denies any chest pain, shortness of breath, or cough. He denies any nausea, vomiting, constipation, or diarrhea. He has no melena or hematochezia. He has no urinary complaints. Patient offers no further specific complaints today.  REVIEW OF SYSTEMS:   Review of Systems  Constitutional: Positive for weight loss. Negative for fever.  Respiratory: Negative.   Cardiovascular: Negative.   Gastrointestinal: Negative.   Musculoskeletal: Negative.   Neurological: Positive for weakness.  Endo/Heme/Allergies: Does not bruise/bleed easily.    As per HPI. Otherwise, a complete review of systems is negatve.  PAST MEDICAL HISTORY:  Hypertension, sleep apnea, atrial fibrillation.  PAST SURGICAL HISTORY: Negative.  FAMILY HISTORY: Reviewed and unchanged. No report of malignancy or chronic disease.     ADVANCED DIRECTIVES:    HEALTH MAINTENANCE: Social History  Substance Use Topics  . Smoking status: Former Smoker -- 8 years  . Smokeless tobacco: Current User    Types: Snuff  . Alcohol Use: No     Colonoscopy:  PAP:  Bone density:  Lipid panel:  No Known Allergies  Current Outpatient Prescriptions  Medication Sig Dispense Refill  . albuterol (PROVENTIL HFA;VENTOLIN HFA) 108 (90 BASE) MCG/ACT inhaler Inhale 2 puffs into the lungs every 4 (four) hours as needed for wheezing  or shortness of breath.    . digoxin (LANOXIN) 0.125 MG tablet Take 1 tablet (0.125 mg total) by mouth daily. 30 tablet 0  . diltiazem (CARDIZEM CD) 360 MG 24 hr capsule Take 1 capsule (360 mg total) by mouth daily. 30 capsule 0  . enalapril (VASOTEC) 20 MG tablet Take 20 mg by mouth 2 (two) times daily.    . finasteride (PROSCAR) 5 MG tablet Take 5 mg by mouth daily.    Marland Kitchen gabapentin (NEURONTIN) 300 MG capsule Take 300 mg by mouth 2 (two) times daily.    Marland Kitchen HYDROcodone-acetaminophen (NORCO) 10-325 MG tablet Take 1 tablet by mouth every 6 (six) hours as needed for severe pain. 20 tablet 0  . levofloxacin (LEVAQUIN) 500 MG tablet Take 1 tablet (500 mg total) by mouth daily. 2 tablet 0  . magic mouthwash SOLN Take 5-10 mLs by mouth 4 (four) times daily.    Marland Kitchen omeprazole (PRILOSEC) 20 MG capsule Take 20 mg by mouth 2 (two) times daily.    . sucralfate (CARAFATE) 1 GM/10ML suspension Take 1 g by mouth 2 (two) times daily.    Marland Kitchen warfarin (COUMADIN) 4 MG tablet Take 2 tablets (8 mg total) by mouth every evening. 60 tablet 0   No current facility-administered medications for this visit.   Facility-Administered Medications Ordered in Other Visits  Medication Dose Route Frequency Provider Last Rate Last Dose  . 0.9 %  sodium chloride infusion   Intravenous Once Lloyd Huger, MD      . sodium chloride 0.9 % injection 10 mL  10 mL Intravenous PRN Lloyd Huger, MD   10 mL at  12/23/14 0945  . sodium chloride 0.9 % injection 10 mL  10 mL Intracatheter PRN Lloyd Huger, MD   10 mL at 12/30/14 0947  . sodium chloride 0.9 % injection 10 mL  10 mL Intravenous PRN Lloyd Huger, MD   10 mL at 01/15/15 1512    OBJECTIVE: There were no vitals filed for this visit.   There is no weight on file to calculate BMI.    ECOG FS:1 - Symptomatic but completely ambulatory  General: Well-developed, well-nourished, no acute distress. Eyes: anicteric sclera. Lungs: Clear to auscultation  bilaterally. Heart: Regular rate and rhythm. No rubs, murmurs, or gallops. Abdomen: Soft, nontender, nondistended. No organomegaly noted, normoactive bowel sounds. Musculoskeletal: No edema, cyanosis, or clubbing. Neuro: Alert, answering all questions appropriately. Cranial nerves grossly intact. Skin: No rashes or petechiae noted. Psych: Normal affect.   LAB RESULTS:  Lab Results  Component Value Date   NA 135 02/27/2015   K 3.9 02/27/2015   CL 100* 02/27/2015   CO2 27 02/27/2015   GLUCOSE 123* 02/27/2015   BUN 17 02/27/2015   CREATININE 0.88 02/27/2015   CALCIUM 8.6* 02/27/2015   PROT 6.7 02/27/2015   ALBUMIN 2.9* 02/27/2015   AST 23 02/27/2015   ALT 13* 02/27/2015   ALKPHOS 97 02/27/2015   BILITOT 0.5 02/27/2015   GFRNONAA >60 02/27/2015   GFRAA >60 02/27/2015    Lab Results  Component Value Date   WBC 7.7 02/27/2015   NEUTROABS 5.3 02/27/2015   HGB 11.4* 02/27/2015   HCT 34.6* 02/27/2015   MCV 91.3 02/27/2015   PLT 199 02/27/2015     STUDIES: Ct Soft Tissue Neck W Contrast  02/25/2015  CLINICAL DATA:  Restaging esophageal cancer. Chemotherapy 2-3 weeks ago. EXAM: CT NECK WITH CONTRAST TECHNIQUE: Multidetector CT imaging of the neck was performed using the standard protocol following the bolus administration of intravenous contrast. CONTRAST:  10m OMNIPAQUE IOHEXOL 350 MG/ML SOLN COMPARISON:  11/10/2014 FINDINGS: Pharynx and larynx: The nasopharynx, oropharynx, oral cavity, hypopharynx, and larynx are unremarkable. The patient is edentulous. Salivary glands: No submandibular or parotid gland mass or inflammation. Thyroid: Unremarkable. Lymph nodes: No enlarged or suspicious upper cervical lymph nodes. Level IV/supraclavicular lymph nodes have mildly increased in size from the prior study and measure up to 9 mm in short axis on the right (series 10, image 75, previously 6 mm) and 10 mm on the left (series 10, image 72, previously 6 mm). Vascular: Prominent  atherosclerotic calcification about the carotid bifurcations with mild-to-moderate proximal left ICA stenosis. Right jugular Port-A-Cath partially visualized. Limited intracranial: The visualized portion of the brain is unremarkable. Visualized orbits: Unremarkable. Mastoids and visualized paranasal sinuses: Visualized paranasal sinuses are clear. Mastoid air cells are underpneumatized. There is chronic opacification of the left epitympanum and left mastoid antrum with surrounding sclerosis. Skeleton: Moderate multilevel cervical disc and facet degeneration. Left facet ankylosis at C5-6. No suspicious lytic or blastic osseous lesions are identified. Upper chest: Evaluated on concurrent dedicated chest CT. IMPRESSION: Increased size of bilateral level IV/supraclavicular lymph nodes, measuring up to 10 mm in short axis. These are indeterminate but suspicious for metastatic disease. Electronically Signed   By: ALogan BoresM.D.   On: 02/25/2015 16:31   Ct Chest W Contrast  02/25/2015  CLINICAL DATA:  Subsequent encounter for esophageal cancer restaging. EXAM: CT CHEST WITH CONTRAST TECHNIQUE: Multidetector CT imaging of the chest was performed during intravenous contrast administration. CONTRAST:  746mOMNIPAQUE IOHEXOL 350 MG/ML SOLN COMPARISON:  11/10/2014.  FINDINGS: Mediastinum / Lymph Nodes: There is no axillary lymphadenopathy. Right-sided central line tip projects at the distal SVC. The right paratracheal lymph node measured previously at 7 mm short axis is now 10 mm short axis. 7 mm short axis subcarinal lymph node measured previously is now 9 mm. No left hilar lymphadenopathy. Focal airspace consolidation in the posterior right lung base tracks up into the right hilum. Heart is enlarged. Circumferential wall thickening in the distal esophagus has decreased in the interval. Wall thickness was measured previously at 2.1 cm and has decreased to about 1.0 cm on the current study. Lungs / Pleura: Lung windows  show interval development of confluent airspace consolidation in the right lower lobe without substantial volume loss. There is some atelectasis in the dependent left lower lobe. No discrete pulmonary parenchymal nodule or mass. Upper Abdomen: No focal abnormality seen within the visualized portions of the liver or spleen. Pancreas and right adrenal gland are unremarkable. 13 mm left adrenal nodule cannot be definitively characterize. MSK / Soft Tissues: Bone windows reveal no worrisome lytic or sclerotic osseous lesions. IMPRESSION: 1. Right lower lobe airspace disease has become much more confluent since the disease seen on the abdomen and pelvis CT of 01/26/2015. Imaging features remain compatible with pneumonia. 2. Interval decrease and wall thickness of the distal esophagus. 3. Small mediastinal lymph nodes have progressed minimally in the interval. Continued close attention recommended. Electronically Signed   By: Misty Stanley M.D.   On: 02/25/2015 11:46    ASSESSMENT: Recurrent adenocarcinoma of the esophagus, HER-2 negative.  PLAN:    1. Esophageal cancer: CT results from February 25, 2015 reviewed independently and reported as above with slight improved of disease.  Proceed with cycle 1 of FOLFIRI.  Plan to give treatment every 2 weeks with reimaging in approximately February 2017. PEG tube has also been discussed to help improve his nutrition, but patient continues to refuse this procedure.  Will also continue to use Neulasta with his treatments. Return to clinic in 2 days for pump removal. Patient's next infusion will be in 3 weeks because of the Thanksgiving holiday. 2. Atrial fibrillation: Continue Coumadin as prescribed. INRs are monitored by his primary care physician. 3. Thrombocytopenia: Resolved. 4. Difficulty swallowing: Secondary to persistent malignancy. Patient has refused PEG tube placement. 5. Nosebleed: Resolved.   Patient expressed understanding and was in agreement with this  plan. He also understands that He can call clinic at any time with any questions, concerns, or complaints.    Lloyd Huger, MD   03/16/2015 11:01 PM

## 2015-03-19 ENCOUNTER — Ambulatory Visit
Admission: EM | Admit: 2015-03-19 | Discharge: 2015-03-19 | Disposition: A | Discharge status: Home / Self Care | Payer: Medicare Other | Attending: Family Medicine | Admitting: Family Medicine

## 2015-03-19 DIAGNOSIS — L03116 Cellulitis of left lower limb: Secondary | ICD-10-CM | POA: Diagnosis not present

## 2015-03-19 MED ORDER — MUPIROCIN 2 % EX OINT
TOPICAL_OINTMENT | CUTANEOUS | Status: DC
Start: 2015-03-19 — End: 2015-06-02

## 2015-03-19 MED ORDER — SULFAMETHOXAZOLE-TRIMETHOPRIM 800-160 MG PO TABS: 1.0000 | ORAL_TABLET | Freq: Two times a day (BID) | ORAL | Status: AC

## 2015-03-19 MED ORDER — TETANUS-DIPHTH-ACELL PERTUSSIS 5-2.5-18.5 LF-MCG/0.5 IM SUSP
0.5000 mL | Freq: Once | INTRAMUSCULAR | Status: AC
  Administered 2015-03-19: 0.5 mL via INTRAMUSCULAR

## 2015-03-19 NOTE — Discharge Instructions (Signed)
Take medication as prescribed. Clean daily with soap and water. Apply topical antibiotic as prescribed. Elevate leg.  Monitor area very closely. Follow-up with your primary care physician closely as discussed. Call to schedule follow-up appointment. Return to urgent care proceed to ER for increased redness, increased pain, swelling, calf pain, difficulty walking, new or worsening concerns.  Cellulitis Cellulitis is an infection of the skin and the tissue beneath it. The infected area is usually red and tender. Cellulitis occurs most often in the arms and lower legs.  CAUSES  Cellulitis is caused by bacteria that enter the skin through cracks or cuts in the skin. The most common types of bacteria that cause cellulitis are staphylococci and streptococci. SIGNS AND SYMPTOMS   Redness and warmth.  Swelling.  Tenderness or pain.  Fever. DIAGNOSIS  Your health care provider can usually determine what is wrong based on a physical exam. Blood tests may also be done. TREATMENT  Treatment usually involves taking an antibiotic medicine. HOME CARE INSTRUCTIONS   Take your antibiotic medicine as directed by your health care provider. Finish the antibiotic even if you start to feel better.  Keep the infected arm or leg elevated to reduce swelling.  Apply a warm cloth to the affected area up to 4 times per day to relieve pain.  Take medicines only as directed by your health care provider.  Keep all follow-up visits as directed by your health care provider. SEEK MEDICAL CARE IF:   You notice red streaks coming from the infected area.  Your red area gets larger or turns dark in color.  Your bone or joint underneath the infected area becomes painful after the skin has healed.  Your infection returns in the same area or another area.  You notice a swollen bump in the infected area.  You develop new symptoms.  You have a fever. SEEK IMMEDIATE MEDICAL CARE IF:   You feel very  sleepy.  You develop vomiting or diarrhea.  You have a general ill feeling (malaise) with muscle aches and pains.   This information is not intended to replace advice given to you by your health care provider. Make sure you discuss any questions you have with your health care provider.   Document Released: 01/12/2005 Document Revised: 12/24/2014 Document Reviewed: 06/20/2011 Elsevier Interactive Patient Education [employer].

## 2015-03-19 NOTE — ED Notes (Signed)
Scrape blade on back of tractor hit left inner lower leg one week ago. + sore with cellulitis

## 2015-03-19 NOTE — ED Provider Notes (Signed)
[facility]  ____________________________________________  Time seen: Approximately 3:00 PM  I have reviewed the triage vital signs and the nursing notes.   HISTORY  Chief Complaint Sore   HPI Jeremy Gordon is a 74 y.o. male presents with complaints of redness surrounding a cut to his left lower leg. Reports that one week ago he was outside and states that he was lowering a scraping blade on the back of a tractor a states that it accidentally hit the inside of his left lower leg. Denies fall or other injury. Denies head injury or loss of consciousness. Patient reports that he has continued to work and remain ambulatory. Patient states that has no pain with ambulating. States area is minimal tender and only with palpated directly at cut site.  Reports that the blade scraped his left lower leg. States that he did clean it with peroxide at home but states within a day or 2 the area was red directly surrounding the cut. States that the redness has gradually increased from the cut site. States it is minimally tender to touch directly around the cut. Denies bleeding, drainage, oozing. Denies lower leg swelling . Denies calf pain . Again denies pain with ambulation. Reports unsure of last tetanus immunization.    denies fevers, nausea, vomiting, weakness, dizziness, chest pain or shortness breath. Reports continues to eat and drink well.  PCP: Joella Prince   Past Medical History  Diagnosis Date  . Esophageal carcinoma ([place])     a. Chemo: FOLFIRI  . Sleep apnea   . Persistent atrial fibrillation ([place])     a. 07/2013 Echo: EF 60-65%, mod dil LA, mild TR;  CHA2DS2VASc = 3-->chronic coumadin.  . Essential hypertension   . History of stress test     a. 06/2003 MV: EF 56%, no ischemia/infarct.  Marland Kitchen COPD (chronic obstructive pulmonary disease) [facility])     Patient Active Problem List   Diagnosis Date Noted  . Persistent atrial fibrillation ([place]) 01/29/2015  . Ventricular tachycardia ([place])  01/29/2015  . Sepsis ([place]) 01/29/2015  . Esophageal carcinoma ([place]) 01/29/2015  . Essential hypertension 01/29/2015  . Hypokalemia 01/29/2015  . Hypomagnesemia 01/29/2015  . Atrial fibrillation with RVR ([place])   . Aspiration pneumonia of right lower lobe ([place])   . Pneumonia 01/26/2015  . Esophageal cancer ([place]) 11/19/2014  . Chest wall pain 10/08/2014  . Can't get food down 02/05/2014  . Apnea, sleep 10/09/2013  . Carpal tunnel syndrome 03/11/2013  . Sciatica 11/23/2012  . Acid reflux 11/23/2012  . 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 Allergic rhinitis 08/15/2012  . Back ache 08/15/2012  . Atrial fibrillation, chronic ([place]) 08/15/2012  . CAFL (chronic airflow limitation) ([place]) 08/15/2012  . Breathlessness on exertion 08/15/2012  . BP (high blood pressure) 08/15/2012  . Malaise and fatigue 08/15/2012  . 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 Arthritis of knee, degenerative 08/15/2012  . 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 Lumbar canal stenosis 08/15/2012  . 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 Peripheral vascular catheterization N/A 11/20/2014    Procedure: Glori Luis Cath Insertion;  Surgeon: Algernon Huxley, MD;  Location: [facility];  Service: Cardiovascular;  Laterality: N/A;  . Hernia repair      Current Outpatient Rx  Name  Route  Sig  Dispense  Refill  . diltiazem (CARDIZEM CD) 360 MG 24 hr capsule   Oral   Take 1  capsule (360 mg total) by mouth daily.   30 capsule   0   . albuterol (PROVENTIL HFA;VENTOLIN HFA) 108 (90 BASE) MCG/ACT inhaler   Inhalation   Inhale 2 puffs into the lungs every 4 (four) hours as needed for wheezing or shortness of breath.         . digoxin (LANOXIN) 0.125 MG tablet   Oral   Take 1 tablet (0.125 mg total) by mouth daily.   30 tablet   0   . enalapril (VASOTEC) 20 MG tablet   Oral   Take 20 mg by mouth 2 (two) times daily.          . finasteride (PROSCAR) 5 MG tablet   Oral   Take 5 mg by mouth daily.         Marland Kitchen gabapentin (NEURONTIN) 300 MG capsule   Oral   Take 300 mg by mouth 2 (two) times daily.         .           .           . magic mouthwash SOLN   Oral   Take 5-10 mLs by mouth 4 (four) times daily.         Marland Kitchen omeprazole (PRILOSEC) 20 MG capsule   Oral   Take 20 mg by mouth 2 (two) times daily.         . sucralfate (CARAFATE) 1 GM/10ML suspension   Oral   Take 1 g by mouth 2 (two) times daily.         Marland Kitchen warfarin (COUMADIN) 4 MG tablet   Oral   Take 2 tablets (8 mg total) by mouth every evening.   60 tablet   0     Allergies Review of patient's allergies indicates not on file.  Family History  Problem Relation Age of Onset  . Hypertension Sister   . Hypertension Brother   . Cancer Mother   . Stroke Father     Social History Social History  Substance Use Topics  . Smoking status: Current Some Day Smoker -- 62 years  . Smokeless tobacco: Current User    Types: Snuff  . Alcohol Use: No    Review of Systems Constitutional: No fever/chills Eyes: No visual changes. ENT: No sore throat. Cardiovascular: Denies chest pain. Respiratory: Denies shortness of breath. Gastrointestinal: No abdominal pain.  No nausea, no vomiting.  No diarrhea.  No constipation. Genitourinary: Negative for dysuria. Musculoskeletal: Negative for back pain. Skin: Negative for rash. Left lower leg redness  Neurological: Negative for headaches, focal weakness or numbness.  10-point ROS otherwise negative.  ____________________________________________   PHYSICAL EXAM:  VITAL SIGNS: ED Triage Vitals  Enc Vitals Group     BP 03/19/15 1356 141/65 mmHg     Pulse Rate 03/19/15 1356 98     Resp 03/19/15 1356 22     Temp 03/19/15 1356 97.7 F (36.5 C)     Temp Source 03/19/15 1356 Tympanic     SpO2 03/19/15 1356 97 %     Weight 03/19/15 1356 222 lb (100.699 kg)     Height 03/19/15 1356  5\' 7"  (1.702 m)     Head Cir --      Peak Flow --      Pain Score 03/19/15 1400 10     Pain Loc --      Pain Edu? --      Excl. in [place]? --     Constitutional:  Alert and oriented. Well appearing and in no acute distress. Eyes: Conjunctivae are normal. PERRL. EOMI. Head: Atraumatic.  Nose: No congestion/rhinnorhea.  Mouth/Throat: Mucous membranes are moist.   Neck: No stridor.  No cervical spine tenderness to palpation. Cardiovascular:Irregularly irregular.  Grossly normal heart sounds.  Good peripheral circulation. Respiratory: Normal respiratory effort.  No retractions. Lungs CTAB. Gastrointestinal: Soft and nontender. No distention. Normal Bowel sounds.  No abdominal bruits. No CVA tenderness. Musculoskeletal: No lower or upper extremity tenderness nor edema.   Bilateral pedal pulses equal and easily palpated.  Neurologic:  Normal speech and language. No gross focal neurologic deficits are appreciated. No gait instability. Skin:  Skin is warm, dry and intact. No rash noted. Except : Left lower leg medial aspect approximately 2 cm superficial healing crusted abrasion with 4 cm x 6 cm mild to moderate erythema immediately surrounding crusted healing abrasion, minimal tenderness to palpation , no induration, no fluctuance , no active drainage. Bilateral pedal pulses equal and easily palpated. No lower extremity edema bilaterally. Bilateral calf nontender. Negative Homans sign bilaterally.  Psychiatric: Mood and affect are normal. Speech and behavior are normal.  ____________________________________________   LABS (all labs ordered are listed, but only abnormal results are displayed)  Labs Reviewed - No data to display   Recent outpatient labs from 02/27/2015 reviewed by Epic.  ____________________________________________   INITIAL IMPRESSION / [place] / ED COURSE  Pertinent labs & imaging results that were available during my care of the patient were reviewed by me and  considered in my medical decision making (see chart for details).  Very well-appearing patient. No acute distress. Ambulatory in room without pain complaints.No pain with ambulation. Patient reports that one week ago he was working outside at his home and states a tractor blade scraped his left lower leg. States gradual onset of redness surrounding the scraped area. Left lower leg secondary cellulitis. Discussed with patient to clean at home with soap and water as well as elevate. Will treat with topical Bactroban and oral Bactrim. Discussed strict follow-up and return parameters. Patient follow-up with his primary care physician within 3 days for wound follow-up. Tetanus immunization up dated.   Discussed follow up with Primary care physician this week. Discussed follow up and return  urgent care or ER parameters including  increased redness, fever, pain, difficulty ambulating, leg swelling, calf pain , no resolution or any worsening concerns. Patient verbalized understanding and agreed to plan.   ____________________________________________   FINAL CLINICAL IMPRESSION(S) / ED DIAGNOSES  Final diagnoses:  Cellulitis of left lower leg       Marylene Land, NP 03/19/15 1616  Marylene Land, NP 03/19/15 1617

## 2015-03-20 ENCOUNTER — Inpatient Hospital Stay: Payer: Medicare Other | Attending: Oncology

## 2015-03-20 ENCOUNTER — Inpatient Hospital Stay (HOSPITAL_BASED_OUTPATIENT_CLINIC_OR_DEPARTMENT_OTHER): Payer: Medicare Other | Admitting: Oncology

## 2015-03-20 ENCOUNTER — Inpatient Hospital Stay: Payer: Medicare Other

## 2015-03-20 VITALS — BP 136/60 | HR 84 | Temp 96.1°F | Resp 20

## 2015-03-20 DIAGNOSIS — I4891 Unspecified atrial fibrillation: Secondary | ICD-10-CM | POA: Insufficient documentation

## 2015-03-20 DIAGNOSIS — R531 Weakness: Secondary | ICD-10-CM | POA: Insufficient documentation

## 2015-03-20 DIAGNOSIS — Z7689 Persons encountering health services in other specified circumstances: Secondary | ICD-10-CM | POA: Insufficient documentation

## 2015-03-20 DIAGNOSIS — C155 Malignant neoplasm of lower third of esophagus: Secondary | ICD-10-CM

## 2015-03-20 DIAGNOSIS — D701 Agranulocytosis secondary to cancer chemotherapy: Secondary | ICD-10-CM | POA: Diagnosis not present

## 2015-03-20 DIAGNOSIS — F17290 Nicotine dependence, other tobacco product, uncomplicated: Secondary | ICD-10-CM

## 2015-03-20 DIAGNOSIS — I10 Essential (primary) hypertension: Secondary | ICD-10-CM

## 2015-03-20 DIAGNOSIS — Z5111 Encounter for antineoplastic chemotherapy: Secondary | ICD-10-CM | POA: Diagnosis not present

## 2015-03-20 DIAGNOSIS — D649 Anemia, unspecified: Secondary | ICD-10-CM | POA: Diagnosis not present

## 2015-03-20 DIAGNOSIS — R5383 Other fatigue: Secondary | ICD-10-CM

## 2015-03-20 DIAGNOSIS — Z7901 Long term (current) use of anticoagulants: Secondary | ICD-10-CM

## 2015-03-20 DIAGNOSIS — R1310 Dysphagia, unspecified: Secondary | ICD-10-CM | POA: Insufficient documentation

## 2015-03-20 DIAGNOSIS — G4730 Sleep apnea, unspecified: Secondary | ICD-10-CM

## 2015-03-20 DIAGNOSIS — Z79899 Other long term (current) drug therapy: Secondary | ICD-10-CM | POA: Diagnosis not present

## 2015-03-20 LAB — COMPREHENSIVE METABOLIC PANEL
ALK PHOS: 94 U/L (ref 38–126)
ALT: 14 U/L — AB (ref 17–63)
AST: 19 U/L (ref 15–41)
Albumin: 3.1 g/dL — ABNORMAL LOW (ref 3.5–5.0)
Anion gap: 6 (ref 5–15)
BILIRUBIN TOTAL: 0.6 mg/dL (ref 0.3–1.2)
BUN: 12 mg/dL (ref 6–20)
CO2: 27 mmol/L (ref 22–32)
Calcium: 8.7 mg/dL — ABNORMAL LOW (ref 8.9–10.3)
Chloride: 102 mmol/L (ref 101–111)
Creatinine, Ser: 0.87 mg/dL (ref 0.61–1.24)
GLUCOSE: 113 mg/dL — AB (ref 65–99)
Potassium: 3.7 mmol/L (ref 3.5–5.1)
Sodium: 135 mmol/L (ref 135–145)
Total Protein: 6.4 g/dL — ABNORMAL LOW (ref 6.5–8.1)

## 2015-03-20 LAB — CBC WITH DIFFERENTIAL/PLATELET
Basophils Absolute: 0 10*3/uL (ref 0–0.1)
Basophils Relative: 1 %
EOS PCT: 4 %
Eosinophils Absolute: 0.1 10*3/uL (ref 0–0.7)
HCT: 33.1 % — ABNORMAL LOW (ref 40.0–52.0)
HEMOGLOBIN: 11.1 g/dL — AB (ref 13.0–18.0)
LYMPHS ABS: 1.1 10*3/uL (ref 1.0–3.6)
LYMPHS PCT: 34 %
MCH: 30.4 pg (ref 26.0–34.0)
MCHC: 33.7 g/dL (ref 32.0–36.0)
MCV: 90.4 fL (ref 80.0–100.0)
Monocytes Absolute: 0.6 10*3/uL (ref 0.2–1.0)
Monocytes Relative: 19 %
Neutro Abs: 1.4 10*3/uL (ref 1.4–6.5)
Neutrophils Relative %: 42 %
PLATELETS: 164 10*3/uL (ref 150–440)
RBC: 3.66 MIL/uL — AB (ref 4.40–5.90)
RDW: 17.8 % — ABNORMAL HIGH (ref 11.5–14.5)
WBC: 3.3 10*3/uL — AB (ref 3.8–10.6)

## 2015-03-20 MED ORDER — FLUOROURACIL CHEMO INJECTION 2.5 GM/50ML
400.0000 mg/m2 | Freq: Once | INTRAVENOUS | Status: AC
  Administered 2015-03-20: 900 mg via INTRAVENOUS
  Filled 2015-03-20: qty 18

## 2015-03-20 MED ORDER — SODIUM CHLORIDE 0.9 % IV SOLN
Freq: Once | INTRAVENOUS | Status: AC
Start: 2015-03-20 — End: 2015-03-20
  Administered 2015-03-20: 09:00:00 via INTRAVENOUS
  Filled 2015-03-20: qty 1000

## 2015-03-20 MED ORDER — HEPARIN SOD (PORK) LOCK FLUSH 100 UNIT/ML IV SOLN: 500.0000 [IU] | Freq: Once | INTRAVENOUS | Status: DC | PRN

## 2015-03-20 MED ORDER — SODIUM CHLORIDE 0.9 % IJ SOLN
10.0000 mL | INTRAMUSCULAR | Status: DC | PRN
  Administered 2015-03-20: 10 mL
  Filled 2015-03-20: qty 10

## 2015-03-20 MED ORDER — SODIUM CHLORIDE 0.9 % IV SOLN
Freq: Once | INTRAVENOUS | Status: AC
  Administered 2015-03-20: 10:00:00 via INTRAVENOUS
  Filled 2015-03-20: qty 8

## 2015-03-20 MED ORDER — SODIUM CHLORIDE 0.9 % IV SOLN
2400.0000 mg/m2 | INTRAVENOUS | Status: DC
  Administered 2015-03-20: 5400 mg via INTRAVENOUS
  Filled 2015-03-20: qty 108

## 2015-03-20 MED ORDER — LEUCOVORIN CALCIUM INJECTION 350 MG
400.0000 mg/m2 | Freq: Once | INTRAVENOUS | Status: AC
  Administered 2015-03-20: 900 mg via INTRAVENOUS
  Filled 2015-03-20: qty 45

## 2015-03-20 MED ORDER — IRINOTECAN HCL CHEMO INJECTION 100 MG/5ML
400.0000 mg | Freq: Once | INTRAVENOUS | Status: AC
  Administered 2015-03-20: 400 mg via INTRAVENOUS
  Filled 2015-03-20: qty 20

## 2015-03-20 NOTE — Progress Notes (Signed)
MD evaluated inflammation and scab on L shin. Pt had gone to urgent care and received antibiotics for it. MD said to proceed with chemo as ordered. Pt will continue to take antibiotics as ordered.

## 2015-03-22 ENCOUNTER — Inpatient Hospital Stay: Payer: Medicare Other

## 2015-03-22 VITALS — BP 143/82 | HR 80 | Temp 96.5°F | Resp 20

## 2015-03-22 DIAGNOSIS — C155 Malignant neoplasm of lower third of esophagus: Secondary | ICD-10-CM

## 2015-03-22 MED ORDER — HEPARIN SOD (PORK) LOCK FLUSH 100 UNIT/ML IV SOLN
500.0000 [IU] | Freq: Once | INTRAVENOUS | Status: AC | PRN
Start: 2015-03-22 — End: 2015-03-22
  Administered 2015-03-22: 500 [IU]

## 2015-03-22 MED ORDER — SODIUM CHLORIDE 0.9 % IJ SOLN
10.0000 mL | INTRAMUSCULAR | Status: DC | PRN
  Administered 2015-03-22: 10 mL
  Filled 2015-03-22: qty 10

## 2015-03-22 MED ORDER — PEGFILGRASTIM INJECTION 6 MG/0.6ML ~~LOC~~
6.0000 mg | PREFILLED_SYRINGE | Freq: Once | SUBCUTANEOUS | Status: AC
  Administered 2015-03-22: 6 mg via SUBCUTANEOUS

## 2015-04-02 ENCOUNTER — Inpatient Hospital Stay: Payer: Medicare Other

## 2015-04-02 VITALS — BP 113/58 | HR 108 | Temp 95.7°F | Resp 20

## 2015-04-02 DIAGNOSIS — C155 Malignant neoplasm of lower third of esophagus: Secondary | ICD-10-CM | POA: Diagnosis not present

## 2015-04-02 DIAGNOSIS — C159 Malignant neoplasm of esophagus, unspecified: Secondary | ICD-10-CM

## 2015-04-02 MED ORDER — SODIUM CHLORIDE 0.9 % IV SOLN
Freq: Once | INTRAVENOUS | Status: AC
  Administered 2015-04-02: 11:00:00 via INTRAVENOUS
  Filled 2015-04-02: qty 1000

## 2015-04-02 MED ORDER — SODIUM CHLORIDE 0.9 % IJ SOLN
10.0000 mL | INTRAMUSCULAR | Status: DC | PRN
  Administered 2015-04-02: 10 mL via INTRAVENOUS
  Filled 2015-04-02: qty 10

## 2015-04-02 MED ORDER — HEPARIN SOD (PORK) LOCK FLUSH 100 UNIT/ML IV SOLN
500.0000 [IU] | Freq: Once | INTRAVENOUS | Status: AC
  Administered 2015-04-02: 500 [IU] via INTRAVENOUS
  Filled 2015-04-02: qty 5

## 2015-04-05 NOTE — Progress Notes (Signed)
[name]  [phone] [phone]) [phone]  ID: Jeremy Gordon OB: 1940/11/26  MR#: [id number]  URK#:[id number]  Patient Care Team: [name], MD as PCP - General (Geriatric Medicine) No Pcp Per Patient (General Practice)  CHIEF COMPLAINT:  No chief complaint on file.   INTERVAL HISTORY: Patient returns to clinic today for further evaluation and consideration of cycle 2 of FOLFIRI.  He continues to have mild weakness and fatigue.  He does not complain of difficulty swallowing today. He has no neurologic complaints. He denies any recent fevers. He denies any chest pain, shortness of breath, or cough. He denies any nausea, vomiting, constipation, or diarrhea. He has no melena or hematochezia. He has no urinary complaints. Patient offers no specific complaints today.  REVIEW OF SYSTEMS:   Review of Systems  Constitutional: Positive for malaise/fatigue. Negative for fever and weight loss.  Respiratory: Negative.   Cardiovascular: Negative.   Gastrointestinal: Negative.   Musculoskeletal: Negative.   Neurological: Negative.   Endo/Heme/Allergies: Does not bruise/bleed easily.    As per HPI. Otherwise, a complete review of systems is negatve.  PAST MEDICAL HISTORY:  Hypertension, sleep apnea, atrial fibrillation.  PAST SURGICAL HISTORY: Negative.  FAMILY HISTORY: Reviewed and unchanged. No report of malignancy or chronic disease.     ADVANCED DIRECTIVES:    HEALTH MAINTENANCE: Social History  Substance Use Topics  . Smoking status: Current Some Day Smoker -- 62 years  . Smokeless tobacco: Current User    Types: Snuff  . Alcohol Use: No     Colonoscopy:  PAP:  Bone density:  Lipid panel:  Not on File  Current Outpatient Prescriptions  Medication Sig Dispense Refill  . albuterol (PROVENTIL HFA;VENTOLIN HFA) 108 (90 BASE) MCG/ACT inhaler Inhale 2 puffs into the lungs every 4 (four) hours as needed for wheezing or shortness of breath.     . digoxin (LANOXIN) 0.125 MG tablet Take 1 tablet (0.125 mg total) by mouth daily. 30 tablet 0  . diltiazem (CARDIZEM CD) 360 MG 24 hr capsule Take 1 capsule (360 mg total) by mouth daily. 30 capsule 0  . enalapril (VASOTEC) 20 MG tablet Take 20 mg by mouth 2 (two) times daily.    . finasteride (PROSCAR) 5 MG tablet Take 5 mg by mouth daily.    Marland Kitchen gabapentin (NEURONTIN) 300 MG capsule Take 300 mg by mouth 2 (two) times daily.    Marland Kitchen HYDROcodone-acetaminophen (NORCO) 10-325 MG tablet Take 1 tablet by mouth every 6 (six) hours as needed for severe pain. 20 tablet 0  . levofloxacin (LEVAQUIN) 500 MG tablet Take 1 tablet (500 mg total) by mouth daily. 2 tablet 0  . magic mouthwash SOLN Take 5-10 mLs by mouth 4 (four) times daily.    . mupirocin ointment (BACTROBAN) 2 % Apply threes times a day for 5 days 22 g 0  . omeprazole (PRILOSEC) 20 MG capsule Take 20 mg by mouth 2 (two) times daily.    . sucralfate (CARAFATE) 1 GM/10ML suspension Take 1 g by mouth 2 (two) times daily.    Marland Kitchen warfarin (COUMADIN) 4 MG tablet Take 2 tablets (8 mg total) by mouth every evening. 60 tablet 0   No current facility-administered medications for this visit.   Facility-Administered Medications Ordered in Other Visits  Medication Dose Route Frequency Provider Last Rate Last Dose  . 0.9 %  sodium chloride infusion   Intravenous Once Lloyd Huger, MD      . sodium chloride 0.9 %  injection 10 mL  10 mL Intravenous PRN Lloyd Huger, MD   10 mL at 12/23/14 0945  . sodium chloride 0.9 % injection 10 mL  10 mL Intracatheter PRN Lloyd Huger, MD   10 mL at 12/30/14 0947  . sodium chloride 0.9 % injection 10 mL  10 mL Intravenous PRN Lloyd Huger, MD   10 mL at 01/15/15 1512    OBJECTIVE: There were no vitals filed for this visit.   There is no weight on file to calculate BMI.    ECOG FS:1 - Symptomatic but completely ambulatory  General: Well-developed, well-nourished, no acute distress. Eyes:  anicteric sclera. Lungs: Clear to auscultation bilaterally. Heart: Regular rate and rhythm. No rubs, murmurs, or gallops. Abdomen: Soft, nontender, nondistended. No organomegaly noted, normoactive bowel sounds. Musculoskeletal: No edema, cyanosis, or clubbing. Neuro: Alert, answering all questions appropriately. Cranial nerves grossly intact. Skin: No rashes or petechiae noted. Psych: Normal affect.   LAB RESULTS:  Lab Results  Component Value Date   NA 135 03/20/2015   K 3.7 03/20/2015   CL 102 03/20/2015   CO2 27 03/20/2015   GLUCOSE 113* 03/20/2015   BUN 12 03/20/2015   CREATININE 0.87 03/20/2015   CALCIUM 8.7* 03/20/2015   PROT 6.4* 03/20/2015   ALBUMIN 3.1* 03/20/2015   AST 19 03/20/2015   ALT 14* 03/20/2015   ALKPHOS 94 03/20/2015   BILITOT 0.6 03/20/2015   GFRNONAA >60 03/20/2015   GFRAA >60 03/20/2015    Lab Results  Component Value Date   WBC 3.3* 03/20/2015   NEUTROABS 1.4 03/20/2015   HGB 11.1* 03/20/2015   HCT 33.1* 03/20/2015   MCV 90.4 03/20/2015   PLT 164 03/20/2015     STUDIES: No results found.  ASSESSMENT: Recurrent adenocarcinoma of the esophagus, HER-2 negative.  PLAN:    1. Esophageal cancer: CT results from February 25, 2015 reviewed independently and reported with slight improved of disease.  Proceed with cycle 2 of FOLFIRI.  Plan to give treatment every 2 weeks with reimaging in approximately February 2017. PEG tube has also been discussed to help improve his nutrition, but patient continues to refuse this procedure.  Will also continue to use Neulasta with his treatments. Return to clinic in 2 days for pump removal and then in 2 weeks for consideration of cycle 3. 2. Atrial fibrillation: Continue Coumadin as prescribed. INRs are monitored by his primary care physician. 3. Thrombocytopenia: Resolved. 4. Difficulty swallowing: Secondary to persistent malignancy. Patient has refused PEG tube placement. 5. Leukopenia: Secondary to  chemotherapy. Neulasta as above. 6. Anemia: Mild, monitor.  Patient expressed understanding and was in agreement with this plan. He also understands that He can call clinic at any time with any questions, concerns, or complaints.    Lloyd Huger, MD   04/05/2015 6:57 AM

## 2015-04-07 ENCOUNTER — Inpatient Hospital Stay (HOSPITAL_BASED_OUTPATIENT_CLINIC_OR_DEPARTMENT_OTHER): Payer: Medicare Other | Admitting: Oncology

## 2015-04-07 ENCOUNTER — Inpatient Hospital Stay: Payer: Medicare Other

## 2015-04-07 VITALS — BP 90/58 | HR 60 | Temp 97.4°F | Resp 20 | Wt 231.0 lb

## 2015-04-07 DIAGNOSIS — C155 Malignant neoplasm of lower third of esophagus: Secondary | ICD-10-CM | POA: Diagnosis not present

## 2015-04-07 DIAGNOSIS — Z7689 Persons encountering health services in other specified circumstances: Secondary | ICD-10-CM | POA: Diagnosis not present

## 2015-04-07 DIAGNOSIS — I4891 Unspecified atrial fibrillation: Secondary | ICD-10-CM | POA: Diagnosis not present

## 2015-04-07 DIAGNOSIS — D649 Anemia, unspecified: Secondary | ICD-10-CM

## 2015-04-07 DIAGNOSIS — Z7901 Long term (current) use of anticoagulants: Secondary | ICD-10-CM

## 2015-04-07 DIAGNOSIS — F17290 Nicotine dependence, other tobacco product, uncomplicated: Secondary | ICD-10-CM

## 2015-04-07 DIAGNOSIS — R5383 Other fatigue: Secondary | ICD-10-CM

## 2015-04-07 DIAGNOSIS — I10 Essential (primary) hypertension: Secondary | ICD-10-CM

## 2015-04-07 DIAGNOSIS — C801 Malignant (primary) neoplasm, unspecified: Secondary | ICD-10-CM

## 2015-04-07 DIAGNOSIS — R531 Weakness: Secondary | ICD-10-CM

## 2015-04-07 DIAGNOSIS — R1310 Dysphagia, unspecified: Secondary | ICD-10-CM

## 2015-04-07 DIAGNOSIS — D701 Agranulocytosis secondary to cancer chemotherapy: Secondary | ICD-10-CM

## 2015-04-07 DIAGNOSIS — G4730 Sleep apnea, unspecified: Secondary | ICD-10-CM

## 2015-04-07 DIAGNOSIS — Z79899 Other long term (current) drug therapy: Secondary | ICD-10-CM

## 2015-04-07 LAB — CBC WITH DIFFERENTIAL/PLATELET
Basophils Absolute: 0.1 10*3/uL (ref 0–0.1)
Basophils Relative: 1 %
Eosinophils Absolute: 0.2 10*3/uL (ref 0–0.7)
Eosinophils Relative: 3 %
HEMATOCRIT: 34 % — AB (ref 40.0–52.0)
HEMOGLOBIN: 11.5 g/dL — AB (ref 13.0–18.0)
LYMPHS ABS: 1.5 10*3/uL (ref 1.0–3.6)
LYMPHS PCT: 20 %
MCH: 30.4 pg (ref 26.0–34.0)
MCHC: 33.7 g/dL (ref 32.0–36.0)
MCV: 90.2 fL (ref 80.0–100.0)
MONO ABS: 0.7 10*3/uL (ref 0.2–1.0)
MONOS PCT: 10 %
NEUTROS ABS: 4.9 10*3/uL (ref 1.4–6.5)
NEUTROS PCT: 66 %
Platelets: 145 10*3/uL — ABNORMAL LOW (ref 150–440)
RBC: 3.77 MIL/uL — ABNORMAL LOW (ref 4.40–5.90)
RDW: 19.1 % — AB (ref 11.5–14.5)
WBC: 7.4 10*3/uL (ref 3.8–10.6)

## 2015-04-07 LAB — COMPREHENSIVE METABOLIC PANEL
ALBUMIN: 3.6 g/dL (ref 3.5–5.0)
ALK PHOS: 82 U/L (ref 38–126)
ALT: 14 U/L — ABNORMAL LOW (ref 17–63)
ANION GAP: 5 (ref 5–15)
AST: 19 U/L (ref 15–41)
BILIRUBIN TOTAL: 0.4 mg/dL (ref 0.3–1.2)
BUN: 16 mg/dL (ref 6–20)
CALCIUM: 8.7 mg/dL — AB (ref 8.9–10.3)
CO2: 28 mmol/L (ref 22–32)
Chloride: 101 mmol/L (ref 101–111)
Creatinine, Ser: 0.83 mg/dL (ref 0.61–1.24)
GLUCOSE: 99 mg/dL (ref 65–99)
POTASSIUM: 3.9 mmol/L (ref 3.5–5.1)
Sodium: 134 mmol/L — ABNORMAL LOW (ref 135–145)
TOTAL PROTEIN: 6.4 g/dL — AB (ref 6.5–8.1)

## 2015-04-07 MED ORDER — LEUCOVORIN CALCIUM INJECTION 350 MG: 400.0000 mg/m2 | Freq: Once | INTRAVENOUS | Status: DC

## 2015-04-07 MED ORDER — HEPARIN SOD (PORK) LOCK FLUSH 100 UNIT/ML IV SOLN: 500.0000 [IU] | Freq: Once | INTRAVENOUS | Status: DC

## 2015-04-07 MED ORDER — SODIUM CHLORIDE 0.9 % IV SOLN
2400.0000 mg/m2 | INTRAVENOUS | Status: DC
  Administered 2015-04-07: 5400 mg via INTRAVENOUS
  Filled 2015-04-07: qty 108

## 2015-04-07 MED ORDER — SODIUM CHLORIDE 0.9 % IJ SOLN
10.0000 mL | INTRAMUSCULAR | Status: DC | PRN
  Administered 2015-04-07: 10 mL via INTRAVENOUS
  Filled 2015-04-07: qty 10

## 2015-04-07 MED ORDER — DEXTROSE 5 % IV SOLN
400.0000 mg | Freq: Once | INTRAVENOUS | Status: AC
  Administered 2015-04-07: 400 mg via INTRAVENOUS
  Filled 2015-04-07: qty 6.67

## 2015-04-07 MED ORDER — LEUCOVORIN CALCIUM INJECTION 100 MG
20.0000 mg/m2 | Freq: Once | INTRAMUSCULAR | Status: AC
  Administered 2015-04-07: 44 mg via INTRAVENOUS
  Filled 2015-04-07: qty 2.2

## 2015-04-07 MED ORDER — FLUOROURACIL CHEMO INJECTION 2.5 GM/50ML
400.0000 mg/m2 | Freq: Once | INTRAVENOUS | Status: AC
  Administered 2015-04-07: 900 mg via INTRAVENOUS
  Filled 2015-04-07: qty 18

## 2015-04-07 MED ORDER — SODIUM CHLORIDE 0.9 % IV SOLN
Freq: Once | INTRAVENOUS | Status: AC
  Administered 2015-04-07: 11:00:00 via INTRAVENOUS
  Filled 2015-04-07: qty 8

## 2015-04-07 MED ORDER — SODIUM CHLORIDE 0.9 % IV SOLN
Freq: Once | INTRAVENOUS | Status: AC
  Administered 2015-04-07: 11:00:00 via INTRAVENOUS
  Filled 2015-04-07: qty 1000

## 2015-04-07 MED ORDER — ATROPINE SULFATE 0.4 MG/ML IJ SOLN
0.4000 mg | Freq: Once | INTRAMUSCULAR | Status: AC | PRN
  Administered 2015-04-07: 0.4 mg via INTRAVENOUS
  Filled 2015-04-07: qty 1

## 2015-04-07 NOTE — Progress Notes (Signed)
Patient here today for ongoing follow up and treatment consideration regarding esophageal cancer. Patient denies any concerns today. 

## 2015-04-09 ENCOUNTER — Inpatient Hospital Stay: Payer: Medicare Other

## 2015-04-09 VITALS — BP 110/68 | HR 102 | Temp 98.4°F | Resp 20

## 2015-04-09 DIAGNOSIS — C155 Malignant neoplasm of lower third of esophagus: Secondary | ICD-10-CM

## 2015-04-09 MED ORDER — PEGFILGRASTIM INJECTION 6 MG/0.6ML ~~LOC~~
6.0000 mg | PREFILLED_SYRINGE | Freq: Once | SUBCUTANEOUS | Status: AC
  Administered 2015-04-09: 6 mg via SUBCUTANEOUS
  Filled 2015-04-09: qty 0.6

## 2015-04-09 MED ORDER — HEPARIN SOD (PORK) LOCK FLUSH 100 UNIT/ML IV SOLN
500.0000 [IU] | Freq: Once | INTRAVENOUS | Status: AC | PRN
  Administered 2015-04-09: 500 [IU]
  Filled 2015-04-09: qty 5

## 2015-04-09 MED ORDER — SODIUM CHLORIDE 0.9 % IJ SOLN
10.0000 mL | INTRAMUSCULAR | Status: DC | PRN
  Administered 2015-04-09: 10 mL
  Filled 2015-04-09: qty 10

## 2015-04-17 ENCOUNTER — Other Ambulatory Visit: Payer: Self-pay | Admitting: *Deleted

## 2015-04-17 DIAGNOSIS — C159 Malignant neoplasm of esophagus, unspecified: Secondary | ICD-10-CM

## 2015-04-19 NOTE — Progress Notes (Signed)
[place]  [phone]) [phone] [phone]) [phone]  ID: Jeremy Gordon OB: 04/13/1941  MR#: [id number]  EXB#:[id number]  Patient Care Team: [name], MD as PCP - General (Geriatric Medicine) No Pcp Per Patient (General Practice)  CHIEF COMPLAINT:  Chief Complaint  Patient presents with  . Esophageal Cancer    follow up  . Chemotherapy    INTERVAL HISTORY: Patient returns to clinic today for further evaluation and consideration of cycle 3 of FOLFIRI.  He continues to have mild weakness and fatigue.  He does not complain of difficulty swallowing today. He has no neurologic complaints. He denies any recent fevers. He denies any chest pain, shortness of breath, or cough. He denies any nausea, vomiting, constipation, or diarrhea. He has no melena or hematochezia. He has no urinary complaints. Patient offers no specific complaints today.  REVIEW OF SYSTEMS:   Review of Systems  Constitutional: Positive for malaise/fatigue. Negative for fever and weight loss.  Respiratory: Negative.  Negative for shortness of breath.   Cardiovascular: Negative.  Negative for chest pain.  Gastrointestinal: Negative.   Musculoskeletal: Negative.   Neurological: Negative.   Endo/Heme/Allergies: Does not bruise/bleed easily.    As per HPI. Otherwise, a complete review of systems is negatve.  PAST MEDICAL HISTORY:  Hypertension, sleep apnea, atrial fibrillation.  PAST SURGICAL HISTORY: Negative.  FAMILY HISTORY: Reviewed and unchanged. No report of malignancy or chronic disease.     ADVANCED DIRECTIVES:    HEALTH MAINTENANCE: Social History  Substance Use Topics  . Smoking status: Current Some Day Smoker -- 62 years  . Smokeless tobacco: Current User    Types: Snuff  . Alcohol Use: No     Colonoscopy:  PAP:  Bone density:  Lipid panel:  Not on File  Current Outpatient Prescriptions  Medication Sig Dispense Refill  . albuterol (PROVENTIL HFA;VENTOLIN HFA)  108 (90 BASE) MCG/ACT inhaler Inhale 2 puffs into the lungs every 4 (four) hours as needed for wheezing or shortness of breath.    . digoxin (LANOXIN) 0.125 MG tablet Take 1 tablet (0.125 mg total) by mouth daily. 30 tablet 0  . diltiazem (CARDIZEM CD) 360 MG 24 hr capsule Take 1 capsule (360 mg total) by mouth daily. 30 capsule 0  . enalapril (VASOTEC) 20 MG tablet Take 20 mg by mouth 2 (two) times daily.    . finasteride (PROSCAR) 5 MG tablet Take 5 mg by mouth daily.    Marland Kitchen gabapentin (NEURONTIN) 300 MG capsule Take 300 mg by mouth 2 (two) times daily.    Marland Kitchen HYDROcodone-acetaminophen (NORCO) 10-325 MG tablet Take 1 tablet by mouth every 6 (six) hours as needed for severe pain. 20 tablet 0  . magic mouthwash SOLN Take 5-10 mLs by mouth 4 (four) times daily.    . mupirocin ointment (BACTROBAN) 2 % Apply threes times a day for 5 days 22 g 0  . omeprazole (PRILOSEC) 20 MG capsule Take 20 mg by mouth 2 (two) times daily.    . sucralfate (CARAFATE) 1 GM/10ML suspension Take 1 g by mouth 2 (two) times daily.    Marland Kitchen warfarin (COUMADIN) 4 MG tablet Take 2 tablets (8 mg total) by mouth every evening. 60 tablet 0  . levofloxacin (LEVAQUIN) 500 MG tablet Take 1 tablet (500 mg total) by mouth daily. (Patient not taking: Reported on 04/07/2015) 2 tablet 0   No current facility-administered medications for this visit.   Facility-Administered Medications Ordered in Other Visits  Medication Dose Route Frequency Provider  Last Rate Last Dose  . 0.9 %  sodium chloride infusion   Intravenous Once Lloyd Huger, MD      . sodium chloride 0.9 % injection 10 mL  10 mL Intravenous PRN Lloyd Huger, MD   10 mL at 12/23/14 0945  . sodium chloride 0.9 % injection 10 mL  10 mL Intracatheter PRN Lloyd Huger, MD   10 mL at 12/30/14 0947  . sodium chloride 0.9 % injection 10 mL  10 mL Intravenous PRN Lloyd Huger, MD   10 mL at 01/15/15 1512    OBJECTIVE: Filed Vitals:   04/07/15 1009  BP: 90/58    Pulse: 60  Temp: 97.4 F (36.3 C)  Resp: 20     Body mass index is 36.17 kg/(m^2).    ECOG FS:1 - Symptomatic but completely ambulatory  General: Well-developed, well-nourished, no acute distress. Eyes: anicteric sclera. Lungs: Clear to auscultation bilaterally. Heart: Regular rate and rhythm. No rubs, murmurs, or gallops. Abdomen: Soft, nontender, nondistended. No organomegaly noted, normoactive bowel sounds. Musculoskeletal: No edema, cyanosis, or clubbing. Neuro: Alert, answering all questions appropriately. Cranial nerves grossly intact. Skin: No rashes or petechiae noted. Psych: Normal affect.   LAB RESULTS:  Lab Results  Component Value Date   NA 134* 04/07/2015   K 3.9 04/07/2015   CL 101 04/07/2015   CO2 28 04/07/2015   GLUCOSE 99 04/07/2015   BUN 16 04/07/2015   CREATININE 0.83 04/07/2015   CALCIUM 8.7* 04/07/2015   PROT 6.4* 04/07/2015   ALBUMIN 3.6 04/07/2015   AST 19 04/07/2015   ALT 14* 04/07/2015   ALKPHOS 82 04/07/2015   BILITOT 0.4 04/07/2015   GFRNONAA >60 04/07/2015   GFRAA >60 04/07/2015    Lab Results  Component Value Date   WBC 7.4 04/07/2015   NEUTROABS 4.9 04/07/2015   HGB 11.5* 04/07/2015   HCT 34.0* 04/07/2015   MCV 90.2 04/07/2015   PLT 145* 04/07/2015     STUDIES: No results found.  ASSESSMENT: Recurrent adenocarcinoma of the esophagus, HER-2 negative.  PLAN:    1. Esophageal cancer: CT results from February 25, 2015 reviewed independently and reported with slight improved of disease.  Proceed with cycle 3 of FOLFIRI.  Plan to give treatment every 2 weeks with reimaging in approximately February 2017. PEG tube has also been discussed to help improve his nutrition, but patient continues to refuse this procedure.  Will also continue to use Neulasta with his treatments. Return to clinic in 2 days for pump removal and Neulasta then in 2 weeks for consideration of cycle 4. 2. Atrial fibrillation: Continue Coumadin as prescribed. INRs  are monitored by his primary care physician. 3. Thrombocytopenia: Mild, monitor. 4. Difficulty swallowing: Patient does not complain of this today. Secondary to persistent malignancy. Patient has refused PEG tube placement. 5. Leukopenia: Resolved. Neulasta as above. 6. Anemia: Mild, monitor.  Patient expressed understanding and was in agreement with this plan. He also understands that He can call clinic at any time with any questions, concerns, or complaints.    Lloyd Huger, MD   04/19/2015 8:13 AM

## 2015-04-21 ENCOUNTER — Inpatient Hospital Stay: Payer: Medicare Other

## 2015-04-21 ENCOUNTER — Inpatient Hospital Stay (HOSPITAL_BASED_OUTPATIENT_CLINIC_OR_DEPARTMENT_OTHER): Payer: Medicare Other | Admitting: Oncology

## 2015-04-21 ENCOUNTER — Inpatient Hospital Stay: Payer: Medicare Other | Attending: Oncology

## 2015-04-21 VITALS — BP 116/72 | HR 70 | Temp 97.2°F | Resp 16 | Wt 231.5 lb

## 2015-04-21 DIAGNOSIS — Z7689 Persons encountering health services in other specified circumstances: Secondary | ICD-10-CM

## 2015-04-21 DIAGNOSIS — I10 Essential (primary) hypertension: Secondary | ICD-10-CM | POA: Insufficient documentation

## 2015-04-21 DIAGNOSIS — D696 Thrombocytopenia, unspecified: Secondary | ICD-10-CM | POA: Diagnosis not present

## 2015-04-21 DIAGNOSIS — C159 Malignant neoplasm of esophagus, unspecified: Secondary | ICD-10-CM

## 2015-04-21 DIAGNOSIS — I4891 Unspecified atrial fibrillation: Secondary | ICD-10-CM | POA: Diagnosis not present

## 2015-04-21 DIAGNOSIS — D649 Anemia, unspecified: Secondary | ICD-10-CM

## 2015-04-21 DIAGNOSIS — Z5111 Encounter for antineoplastic chemotherapy: Secondary | ICD-10-CM | POA: Diagnosis not present

## 2015-04-21 DIAGNOSIS — G4730 Sleep apnea, unspecified: Secondary | ICD-10-CM | POA: Diagnosis not present

## 2015-04-21 DIAGNOSIS — R1310 Dysphagia, unspecified: Secondary | ICD-10-CM | POA: Diagnosis not present

## 2015-04-21 DIAGNOSIS — Z79899 Other long term (current) drug therapy: Secondary | ICD-10-CM | POA: Insufficient documentation

## 2015-04-21 DIAGNOSIS — Z7901 Long term (current) use of anticoagulants: Secondary | ICD-10-CM

## 2015-04-21 DIAGNOSIS — C155 Malignant neoplasm of lower third of esophagus: Secondary | ICD-10-CM

## 2015-04-21 LAB — CBC WITH DIFFERENTIAL/PLATELET
Basophils Absolute: 0 10*3/uL (ref 0–0.1)
Basophils Relative: 1 %
EOS ABS: 0.1 10*3/uL (ref 0–0.7)
Eosinophils Relative: 3 %
HEMATOCRIT: 36.7 % — AB (ref 40.0–52.0)
HEMOGLOBIN: 12.3 g/dL — AB (ref 13.0–18.0)
LYMPHS ABS: 1.1 10*3/uL (ref 1.0–3.6)
LYMPHS PCT: 23 %
MCH: 30.4 pg (ref 26.0–34.0)
MCHC: 33.5 g/dL (ref 32.0–36.0)
MCV: 90.8 fL (ref 80.0–100.0)
MONOS PCT: 10 %
Monocytes Absolute: 0.5 10*3/uL (ref 0.2–1.0)
NEUTROS PCT: 63 %
Neutro Abs: 3 10*3/uL (ref 1.4–6.5)
Platelets: 169 10*3/uL (ref 150–440)
RBC: 4.04 MIL/uL — ABNORMAL LOW (ref 4.40–5.90)
RDW: 19.3 % — ABNORMAL HIGH (ref 11.5–14.5)
WBC: 4.8 10*3/uL (ref 3.8–10.6)

## 2015-04-21 LAB — COMPREHENSIVE METABOLIC PANEL
ALT: 15 U/L — AB (ref 17–63)
AST: 18 U/L (ref 15–41)
Albumin: 3.9 g/dL (ref 3.5–5.0)
Alkaline Phosphatase: 94 U/L (ref 38–126)
Anion gap: 5 (ref 5–15)
BUN: 18 mg/dL (ref 6–20)
CHLORIDE: 101 mmol/L (ref 101–111)
CO2: 29 mmol/L (ref 22–32)
CREATININE: 1.03 mg/dL (ref 0.61–1.24)
Calcium: 8.9 mg/dL (ref 8.9–10.3)
GFR calc non Af Amer: >60 mL/min (ref >60)
Glucose, Bld: 122 mg/dL — ABNORMAL HIGH (ref 65–99)
Potassium: 3.5 mmol/L (ref 3.5–5.1)
SODIUM: 135 mmol/L (ref 135–145)
Total Bilirubin: 0.6 mg/dL (ref 0.3–1.2)
Total Protein: 6.8 g/dL (ref 6.5–8.1)

## 2015-04-21 MED ORDER — LEUCOVORIN CALCIUM INJECTION 350 MG: 400.0000 mg/m2 | Freq: Once | INTRAVENOUS | Status: DC

## 2015-04-21 MED ORDER — SODIUM CHLORIDE 0.9 % IV SOLN
2400.0000 mg/m2 | INTRAVENOUS | Status: DC
  Administered 2015-04-21: 5400 mg via INTRAVENOUS
  Filled 2015-04-21: qty 108

## 2015-04-21 MED ORDER — SODIUM CHLORIDE 0.9 % IJ SOLN
10.0000 mL | INTRAMUSCULAR | Status: DC | PRN
  Administered 2015-04-21: 10 mL
  Filled 2015-04-21: qty 10

## 2015-04-21 MED ORDER — FLUOROURACIL CHEMO INJECTION 2.5 GM/50ML
400.0000 mg/m2 | Freq: Once | INTRAVENOUS | Status: AC
  Administered 2015-04-21: 900 mg via INTRAVENOUS
  Filled 2015-04-21: qty 18

## 2015-04-21 MED ORDER — SODIUM CHLORIDE 0.9 % IV SOLN
Freq: Once | INTRAVENOUS | Status: AC
Start: 2015-04-21 — End: 2015-04-21
  Administered 2015-04-21: 11:00:00 via INTRAVENOUS
  Filled 2015-04-21: qty 1000

## 2015-04-21 MED ORDER — ATROPINE SULFATE 0.4 MG/ML IJ SOLN
0.4000 mg | Freq: Once | INTRAMUSCULAR | Status: AC | PRN
  Administered 2015-04-21: 0.4 mg via INTRAVENOUS
  Filled 2015-04-21: qty 1

## 2015-04-21 MED ORDER — LEUCOVORIN CALCIUM INJECTION 100 MG
20.0000 mg/m2 | Freq: Once | INTRAMUSCULAR | Status: AC
  Administered 2015-04-21: 44 mg via INTRAVENOUS
  Filled 2015-04-21: qty 2.2

## 2015-04-21 MED ORDER — SODIUM CHLORIDE 0.9 % IV SOLN
Freq: Once | INTRAVENOUS | Status: AC
  Administered 2015-04-21: 11:00:00 via INTRAVENOUS
  Filled 2015-04-21: qty 8

## 2015-04-21 MED ORDER — IRINOTECAN HCL CHEMO INJECTION 100 MG/5ML
400.0000 mg | Freq: Once | INTRAVENOUS | Status: AC
  Administered 2015-04-21: 400 mg via INTRAVENOUS
  Filled 2015-04-21: qty 6.67

## 2015-04-21 MED ORDER — HEPARIN SOD (PORK) LOCK FLUSH 100 UNIT/ML IV SOLN: 500.0000 [IU] | Freq: Once | INTRAVENOUS | Status: DC | PRN

## 2015-04-21 NOTE — Progress Notes (Signed)
Patient does not offer any problems today.  

## 2015-04-21 NOTE — Progress Notes (Signed)
[place]  [phone]) [phone] [phone]) [phone]  ID: Jeremy Gordon OB: 1940-08-05  MR#: [id number]  JJK#:[id number]  Patient Care Team: [name], MD as PCP - General (Geriatric Medicine) No Pcp Per Patient (General Practice)  CHIEF COMPLAINT:  Chief Complaint  Patient presents with  . Esophageal Cancer    INTERVAL HISTORY: Patient returns to clinic today for further evaluation and consideration of cycle 4 of FOLFIRI.  He does not complain of weakness or fatigue today. Patient states he now has a normal diet and has no difficulty swallowing. He has no neurologic complaints. He denies any recent fevers. He denies any chest pain, shortness of breath, or cough. He denies any nausea, vomiting, constipation, or diarrhea. He has no melena or hematochezia. He has no urinary complaints. Patient offers no specific complaints today.  REVIEW OF SYSTEMS:   Review of Systems  Constitutional: Negative for fever, weight loss and malaise/fatigue.  HENT: Negative.   Respiratory: Negative.  Negative for shortness of breath.   Cardiovascular: Negative.  Negative for chest pain.  Gastrointestinal: Negative.   Musculoskeletal: Negative.   Neurological: Negative.  Negative for weakness.  Endo/Heme/Allergies: Does not bruise/bleed easily.    As per HPI. Otherwise, a complete review of systems is negatve.  PAST MEDICAL HISTORY:  Hypertension, sleep apnea, atrial fibrillation.  PAST SURGICAL HISTORY: Negative.  FAMILY HISTORY: Reviewed and unchanged. No report of malignancy or chronic disease.     ADVANCED DIRECTIVES:    HEALTH MAINTENANCE: Social History  Substance Use Topics  . Smoking status: Current Some Day Smoker -- 62 years  . Smokeless tobacco: Current User    Types: Snuff  . Alcohol Use: No     Colonoscopy:  PAP:  Bone density:  Lipid panel:  Not on File  Current Outpatient Prescriptions  Medication Sig Dispense Refill  . albuterol  (PROVENTIL HFA;VENTOLIN HFA) 108 (90 BASE) MCG/ACT inhaler Inhale 2 puffs into the lungs every 4 (four) hours as needed for wheezing or shortness of breath.    . digoxin (LANOXIN) 0.125 MG tablet Take 1 tablet (0.125 mg total) by mouth daily. 30 tablet 0  . diltiazem (CARDIZEM CD) 360 MG 24 hr capsule Take 1 capsule (360 mg total) by mouth daily. 30 capsule 0  . enalapril (VASOTEC) 20 MG tablet Take 20 mg by mouth 2 (two) times daily.    . finasteride (PROSCAR) 5 MG tablet Take 5 mg by mouth daily.    Marland Kitchen gabapentin (NEURONTIN) 300 MG capsule Take 300 mg by mouth 2 (two) times daily.    Marland Kitchen HYDROcodone-acetaminophen (NORCO) 10-325 MG tablet Take 1 tablet by mouth every 6 (six) hours as needed for severe pain. 20 tablet 0  . magic mouthwash SOLN Take 5-10 mLs by mouth 4 (four) times daily.    . mupirocin ointment (BACTROBAN) 2 % Apply threes times a day for 5 days 22 g 0  . omeprazole (PRILOSEC) 20 MG capsule Take 20 mg by mouth 2 (two) times daily.    . sucralfate (CARAFATE) 1 GM/10ML suspension Take 1 g by mouth 2 (two) times daily.    Marland Kitchen warfarin (COUMADIN) 4 MG tablet Take 2 tablets (8 mg total) by mouth every evening. 60 tablet 0  . levofloxacin (LEVAQUIN) 500 MG tablet Take 1 tablet (500 mg total) by mouth daily. (Patient not taking: Reported on 04/07/2015) 2 tablet 0   No current facility-administered medications for this visit.   Facility-Administered Medications Ordered in Other Visits  Medication Dose Route  Frequency Provider Last Rate Last Dose  . 0.9 %  sodium chloride infusion   Intravenous Once Lloyd Huger, MD      . fluorouracil (ADRUCIL) 5,400 mg in sodium chloride 0.9 % 142 mL chemo infusion  2,400 mg/m2 (Treatment Plan Actual) Intravenous 1 day or 1 dose Lloyd Huger, MD      . fluorouracil (ADRUCIL) chemo injection 900 mg  400 mg/m2 (Treatment Plan Actual) Intravenous Once Lloyd Huger, MD      . heparin lock flush 100 unit/mL  500 Units Intracatheter Once PRN  Lloyd Huger, MD      . leucovorin injection 44 mg  20 mg/m2 Intravenous Once Lloyd Huger, MD      . sodium chloride 0.9 % injection 10 mL  10 mL Intravenous PRN Lloyd Huger, MD   10 mL at 12/23/14 0945  . sodium chloride 0.9 % injection 10 mL  10 mL Intracatheter PRN Lloyd Huger, MD   10 mL at 12/30/14 0947  . sodium chloride 0.9 % injection 10 mL  10 mL Intravenous PRN Lloyd Huger, MD   10 mL at 01/15/15 1512  . sodium chloride 0.9 % injection 10 mL  10 mL Intracatheter PRN Lloyd Huger, MD   10 mL at 04/21/15 0856    OBJECTIVE: Filed Vitals:   04/21/15 0949  BP: 116/72  Pulse: 70  Temp: 97.2 F (36.2 C)  Resp: 16     Body mass index is 36.25 kg/(m^2).    ECOG FS:1 - Symptomatic but completely ambulatory  General: Well-developed, well-nourished, no acute distress. Eyes: anicteric sclera. Lungs: Clear to auscultation bilaterally. Heart: Regular rate and rhythm. No rubs, murmurs, or gallops. Abdomen: Soft, nontender, nondistended. No organomegaly noted, normoactive bowel sounds. Musculoskeletal: No edema, cyanosis, or clubbing. Neuro: Alert, answering all questions appropriately. Cranial nerves grossly intact. Skin: No rashes or petechiae noted. Psych: Normal affect.   LAB RESULTS:  Lab Results  Component Value Date   NA 135 04/21/2015   K 3.5 04/21/2015   CL 101 04/21/2015   CO2 29 04/21/2015   GLUCOSE 122* 04/21/2015   BUN 18 04/21/2015   CREATININE 1.03 04/21/2015   CALCIUM 8.9 04/21/2015   PROT 6.8 04/21/2015   ALBUMIN 3.9 04/21/2015   AST 18 04/21/2015   ALT 15* 04/21/2015   ALKPHOS 94 04/21/2015   BILITOT 0.6 04/21/2015   GFRNONAA >60 04/21/2015   GFRAA >60 04/21/2015    Lab Results  Component Value Date   WBC 4.8 04/21/2015   NEUTROABS 3.0 04/21/2015   HGB 12.3* 04/21/2015   HCT 36.7* 04/21/2015   MCV 90.8 04/21/2015   PLT 169 04/21/2015     STUDIES: No results found.  ASSESSMENT: Recurrent adenocarcinoma  of the esophagus, HER-2 negative.  PLAN:    1. Esophageal cancer: CT results from February 25, 2015 reviewed independently and reported with slight improved of disease.  Proceed with cycle 4 of FOLFIRI.  Plan to give treatment every 2 weeks with reimaging in approximately February 2017. Will also continue to use Neulasta with his treatments. Return to clinic in 2 days for pump removal and Neulasta then in 2 weeks for consideration of cycle 5. 2. Atrial fibrillation: Continue Coumadin as prescribed. INRs are monitored by his primary care physician. 3. Thrombocytopenia: Patient's platelet count is now within normal limits. 4. Difficulty swallowing: Patient does not complain of this today. Secondary to persistent malignancy. Patient has refused PEG tube placement in the past.  5. Leukopenia: Resolved. Neulasta as above. 6. Anemia: Mild, monitor.  Patient expressed understanding and was in agreement with this plan. He also understands that He can call clinic at any time with any questions, concerns, or complaints.    Lloyd Huger, MD   04/21/2015 1:22 PM

## 2015-04-23 ENCOUNTER — Inpatient Hospital Stay: Payer: Medicare Other

## 2015-04-23 VITALS — BP 94/63 | HR 109 | Temp 96.2°F | Resp 24

## 2015-04-23 DIAGNOSIS — C155 Malignant neoplasm of lower third of esophagus: Secondary | ICD-10-CM

## 2015-04-23 DIAGNOSIS — C159 Malignant neoplasm of esophagus, unspecified: Secondary | ICD-10-CM | POA: Diagnosis not present

## 2015-04-23 MED ORDER — PEGFILGRASTIM INJECTION 6 MG/0.6ML ~~LOC~~
6.0000 mg | PREFILLED_SYRINGE | Freq: Once | SUBCUTANEOUS | Status: AC
  Administered 2015-04-23: 6 mg via SUBCUTANEOUS
  Filled 2015-04-23: qty 0.6

## 2015-04-23 MED ORDER — SODIUM CHLORIDE 0.9 % IJ SOLN
10.0000 mL | INTRAMUSCULAR | Status: DC | PRN
  Administered 2015-04-23: 10 mL
  Filled 2015-04-23: qty 10

## 2015-04-23 MED ORDER — HEPARIN SOD (PORK) LOCK FLUSH 100 UNIT/ML IV SOLN
500.0000 [IU] | Freq: Once | INTRAVENOUS | Status: AC | PRN
  Administered 2015-04-23: 500 [IU]
  Filled 2015-04-23: qty 5

## 2015-04-24 ENCOUNTER — Telehealth: Payer: Self-pay

## 2015-04-24 NOTE — Telephone Encounter (Signed)
  Oncology Nurse Navigator Documentation    Navigator Encounter Type: Telephone (04/24/15 1100)               Barriers/Navigation Needs: Financial (04/24/15 1100)                          Time Spent with Patient: 30 (04/24/15 1100)   Received call from patient needing assistance with oil. Will provide 200 gallons for patient.

## 2015-05-05 ENCOUNTER — Inpatient Hospital Stay: Payer: Medicare Other

## 2015-05-05 ENCOUNTER — Inpatient Hospital Stay (HOSPITAL_BASED_OUTPATIENT_CLINIC_OR_DEPARTMENT_OTHER): Payer: Medicare Other | Admitting: Oncology

## 2015-05-05 VITALS — BP 107/76 | HR 64 | Temp 96.1°F | Resp 18 | Wt 234.8 lb

## 2015-05-05 VITALS — BP 138/70 | HR 73 | Temp 97.1°F

## 2015-05-05 DIAGNOSIS — C159 Malignant neoplasm of esophagus, unspecified: Secondary | ICD-10-CM

## 2015-05-05 DIAGNOSIS — I10 Essential (primary) hypertension: Secondary | ICD-10-CM

## 2015-05-05 DIAGNOSIS — I4891 Unspecified atrial fibrillation: Secondary | ICD-10-CM

## 2015-05-05 DIAGNOSIS — D649 Anemia, unspecified: Secondary | ICD-10-CM

## 2015-05-05 DIAGNOSIS — C155 Malignant neoplasm of lower third of esophagus: Secondary | ICD-10-CM

## 2015-05-05 DIAGNOSIS — G4730 Sleep apnea, unspecified: Secondary | ICD-10-CM

## 2015-05-05 DIAGNOSIS — Z7901 Long term (current) use of anticoagulants: Secondary | ICD-10-CM

## 2015-05-05 DIAGNOSIS — Z79899 Other long term (current) drug therapy: Secondary | ICD-10-CM

## 2015-05-05 LAB — CBC WITH DIFFERENTIAL/PLATELET
BASOS PCT: 1 %
Basophils Absolute: 0 10*3/uL (ref 0–0.1)
EOS ABS: 0.1 10*3/uL (ref 0–0.7)
EOS PCT: 3 %
HCT: 37.4 % — ABNORMAL LOW (ref 40.0–52.0)
Hemoglobin: 12.3 g/dL — ABNORMAL LOW (ref 13.0–18.0)
LYMPHS ABS: 1.1 10*3/uL (ref 1.0–3.6)
Lymphocytes Relative: 20 %
MCH: 30.1 pg (ref 26.0–34.0)
MCHC: 33 g/dL (ref 32.0–36.0)
MCV: 91 fL (ref 80.0–100.0)
MONOS PCT: 11 %
Monocytes Absolute: 0.6 10*3/uL (ref 0.2–1.0)
Neutro Abs: 3.4 10*3/uL (ref 1.4–6.5)
Neutrophils Relative %: 65 %
Platelets: 155 10*3/uL (ref 150–440)
RBC: 4.1 MIL/uL — ABNORMAL LOW (ref 4.40–5.90)
RDW: 19.6 % — ABNORMAL HIGH (ref 11.5–14.5)
WBC: 5.2 10*3/uL (ref 3.8–10.6)

## 2015-05-05 LAB — COMPREHENSIVE METABOLIC PANEL
ALK PHOS: 95 U/L (ref 38–126)
ALT: 16 U/L — AB (ref 17–63)
AST: 19 U/L (ref 15–41)
Albumin: 3.5 g/dL (ref 3.5–5.0)
Anion gap: 6 (ref 5–15)
BUN: 11 mg/dL (ref 6–20)
CALCIUM: 8.9 mg/dL (ref 8.9–10.3)
CHLORIDE: 101 mmol/L (ref 101–111)
CO2: 29 mmol/L (ref 22–32)
CREATININE: 0.94 mg/dL (ref 0.61–1.24)
Glucose, Bld: 124 mg/dL — ABNORMAL HIGH (ref 65–99)
Potassium: 3.3 mmol/L — ABNORMAL LOW (ref 3.5–5.1)
Sodium: 136 mmol/L (ref 135–145)
Total Bilirubin: 0.7 mg/dL (ref 0.3–1.2)
Total Protein: 6.2 g/dL — ABNORMAL LOW (ref 6.5–8.1)

## 2015-05-05 MED ORDER — SODIUM CHLORIDE 0.9 % IV SOLN
Freq: Once | INTRAVENOUS | Status: AC
  Administered 2015-05-05: 11:00:00 via INTRAVENOUS
  Filled 2015-05-05: qty 1000

## 2015-05-05 MED ORDER — ATROPINE SULFATE 0.4 MG/ML IJ SOLN
0.4000 mg | Freq: Once | INTRAMUSCULAR | Status: AC | PRN
  Administered 2015-05-05: 0.4 mg via INTRAVENOUS
  Filled 2015-05-05: qty 1

## 2015-05-05 MED ORDER — HEPARIN SOD (PORK) LOCK FLUSH 100 UNIT/ML IV SOLN: 500.0000 [IU] | Freq: Once | INTRAVENOUS | Status: DC | PRN

## 2015-05-05 MED ORDER — LEUCOVORIN CALCIUM INJECTION 350 MG: 400.0000 mg/m2 | Freq: Once | INTRAVENOUS | Status: DC

## 2015-05-05 MED ORDER — SODIUM CHLORIDE 0.9 % IV SOLN
Freq: Once | INTRAVENOUS | Status: AC
  Administered 2015-05-05: 12:00:00 via INTRAVENOUS
  Filled 2015-05-05: qty 8

## 2015-05-05 MED ORDER — LEUCOVORIN CALCIUM INJECTION 100 MG
20.0000 mg/m2 | Freq: Once | INTRAMUSCULAR | Status: AC
  Administered 2015-05-05: 46 mg via INTRAVENOUS
  Filled 2015-05-05: qty 2.3

## 2015-05-05 MED ORDER — SODIUM CHLORIDE 0.9 % IJ SOLN
10.0000 mL | INTRAMUSCULAR | Status: DC | PRN
  Administered 2015-05-05: 10 mL
  Filled 2015-05-05: qty 10

## 2015-05-05 MED ORDER — IRINOTECAN HCL CHEMO INJECTION 100 MG/5ML
400.0000 mg | Freq: Once | INTRAVENOUS | Status: AC
  Administered 2015-05-05: 400 mg via INTRAVENOUS
  Filled 2015-05-05: qty 6.67

## 2015-05-05 MED ORDER — FLUOROURACIL CHEMO INJECTION 5 GM/100ML
2400.0000 mg/m2 | INTRAVENOUS | Status: DC
  Administered 2015-05-05: 5400 mg via INTRAVENOUS
  Filled 2015-05-05: qty 108

## 2015-05-05 MED ORDER — DIPHENHYDRAMINE HCL 50 MG/ML IJ SOLN
25.0000 mg | Freq: Once | INTRAMUSCULAR | Status: AC
Start: 2015-05-05 — End: 2015-05-05
  Administered 2015-05-05: 25 mg via INTRAVENOUS

## 2015-05-05 MED ORDER — FLUOROURACIL CHEMO INJECTION 2.5 GM/50ML
400.0000 mg/m2 | Freq: Once | INTRAVENOUS | Status: AC
  Administered 2015-05-05: 900 mg via INTRAVENOUS
  Filled 2015-05-05: qty 18

## 2015-05-05 NOTE — Progress Notes (Signed)
Patient reported that he was feeling funny and "swimmy headed"  Patient was advised to sit back down, Dr. Grayland Ormond called, 25 mg benadryl given.  Pateint reported feeling better and vital sign WDL

## 2015-05-05 NOTE — Progress Notes (Signed)
Patient reports his appetite is doing well with no pain but he has been feeling weak.

## 2015-05-06 NOTE — Progress Notes (Signed)
[place]  [phone] [phone]) [phone]  ID: Jeremy Gordon OB: 1941/03/31  MR#: [id number]  ZLD#:[id number]  Patient Care Team: [name], MD as PCP - General (Geriatric Medicine) No Pcp Per Patient (General Practice)  CHIEF COMPLAINT:  Chief Complaint  Patient presents with  . Esophageal Cancer    INTERVAL HISTORY: Patient returns to clinic today for further evaluation and consideration of cycle 5 of FOLFIRI.  He does not complain of weakness or fatigue today. Patient continues to have a normal diet and has no difficulty swallowing. He has no neurologic complaints. He denies any recent fevers. He denies any chest pain, shortness of breath, or cough. He denies any nausea, vomiting, constipation, or diarrhea. He has no melena or hematochezia. He has no urinary complaints. Patient offers no specific complaints today.  REVIEW OF SYSTEMS:   Review of Systems  Constitutional: Negative for fever, weight loss and malaise/fatigue.  HENT: Negative.   Respiratory: Negative.  Negative for shortness of breath.   Cardiovascular: Negative.  Negative for chest pain.  Gastrointestinal: Negative.   Musculoskeletal: Negative.   Neurological: Negative.  Negative for weakness.  Endo/Heme/Allergies: Does not bruise/bleed easily.    As per HPI. Otherwise, a complete review of systems is negatve.  PAST MEDICAL HISTORY:  Hypertension, sleep apnea, atrial fibrillation.  PAST SURGICAL HISTORY: Negative.  FAMILY HISTORY: Reviewed and unchanged. No report of malignancy or chronic disease.     ADVANCED DIRECTIVES:    HEALTH MAINTENANCE: Social History  Substance Use Topics  . Smoking status: Current Some Day Smoker -- 62 years  . Smokeless tobacco: Current User    Types: Snuff  . Alcohol Use: No     Colonoscopy:  PAP:  Bone density:  Lipid panel:  Not on File  Current Outpatient Prescriptions  Medication Sig Dispense Refill  . albuterol  (PROVENTIL HFA;VENTOLIN HFA) 108 (90 BASE) MCG/ACT inhaler Inhale 2 puffs into the lungs every 4 (four) hours as needed for wheezing or shortness of breath.    . digoxin (LANOXIN) 0.125 MG tablet Take 1 tablet (0.125 mg total) by mouth daily. 30 tablet 0  . diltiazem (CARDIZEM CD) 360 MG 24 hr capsule Take 1 capsule (360 mg total) by mouth daily. 30 capsule 0  . enalapril (VASOTEC) 20 MG tablet Take 20 mg by mouth 2 (two) times daily.    . finasteride (PROSCAR) 5 MG tablet Take 5 mg by mouth daily.    Marland Kitchen gabapentin (NEURONTIN) 300 MG capsule Take 300 mg by mouth 2 (two) times daily.    Marland Kitchen HYDROcodone-acetaminophen (NORCO) 10-325 MG tablet Take 1 tablet by mouth every 6 (six) hours as needed for severe pain. 20 tablet 0  . magic mouthwash SOLN Take 5-10 mLs by mouth 4 (four) times daily.    Marland Kitchen omeprazole (PRILOSEC) 20 MG capsule Take 20 mg by mouth 2 (two) times daily.    . sucralfate (CARAFATE) 1 GM/10ML suspension Take 1 g by mouth 2 (two) times daily.    Marland Kitchen warfarin (COUMADIN) 4 MG tablet Take 2 tablets (8 mg total) by mouth every evening. 60 tablet 0  . levofloxacin (LEVAQUIN) 500 MG tablet Take 1 tablet (500 mg total) by mouth daily. (Patient not taking: Reported on 04/07/2015) 2 tablet 0  . mupirocin ointment (BACTROBAN) 2 % Apply threes times a day for 5 days (Patient not taking: Reported on 05/05/2015) 22 g 0   No current facility-administered medications for this visit.   Facility-Administered Medications Ordered in Other  Visits  Medication Dose Route Frequency Provider Last Rate Last Dose  . 0.9 %  sodium chloride infusion   Intravenous Once Lloyd Huger, MD      . sodium chloride 0.9 % injection 10 mL  10 mL Intravenous PRN Lloyd Huger, MD   10 mL at 12/23/14 0945  . sodium chloride 0.9 % injection 10 mL  10 mL Intracatheter PRN Lloyd Huger, MD   10 mL at 12/30/14 0947  . sodium chloride 0.9 % injection 10 mL  10 mL Intravenous PRN Lloyd Huger, MD   10 mL at  01/15/15 1512    OBJECTIVE: Filed Vitals:   05/05/15 1057  BP: 107/76  Pulse: 64  Temp: 96.1 F (35.6 C)  Resp: 18     Body mass index is 36.76 kg/(m^2).    ECOG FS:1 - Symptomatic but completely ambulatory  General: Well-developed, well-nourished, no acute distress. Eyes: anicteric sclera. Lungs: Clear to auscultation bilaterally. Heart: Regular rate and rhythm. No rubs, murmurs, or gallops. Abdomen: Soft, nontender, nondistended. No organomegaly noted, normoactive bowel sounds. Musculoskeletal: No edema, cyanosis, or clubbing. Neuro: Alert, answering all questions appropriately. Cranial nerves grossly intact. Skin: No rashes or petechiae noted. Psych: Normal affect.   LAB RESULTS:  Lab Results  Component Value Date   NA 136 05/05/2015   K 3.3* 05/05/2015   CL 101 05/05/2015   CO2 29 05/05/2015   GLUCOSE 124* 05/05/2015   BUN 11 05/05/2015   CREATININE 0.94 05/05/2015   CALCIUM 8.9 05/05/2015   PROT 6.2* 05/05/2015   ALBUMIN 3.5 05/05/2015   AST 19 05/05/2015   ALT 16* 05/05/2015   ALKPHOS 95 05/05/2015   BILITOT 0.7 05/05/2015   GFRNONAA >60 05/05/2015   GFRAA >60 05/05/2015    Lab Results  Component Value Date   WBC 5.2 05/05/2015   NEUTROABS 3.4 05/05/2015   HGB 12.3* 05/05/2015   HCT 37.4* 05/05/2015   MCV 91.0 05/05/2015   PLT 155 05/05/2015     STUDIES: No results found.  ASSESSMENT: Recurrent adenocarcinoma of the esophagus, HER-2 negative.  PLAN:    1. Esophageal cancer: CT results from February 25, 2015 reviewed independently and reported with slight improved of disease.  Proceed with cycle 5 of FOLFIRI.  Plan to give treatment every 2 weeks with reimaging in approximately February 2017. Will also continue to use Neulasta with his treatments. Return to clinic in 2 days for pump removal and Neulasta then in 2 weeks for consideration of cycle 6. 2. Atrial fibrillation: Continue Coumadin as prescribed. INRs are monitored by his primary care  physician. 3. Thrombocytopenia: Patient's platelet count is now within normal limits. 4. Difficulty swallowing: Patient does not complain of this today. Secondary to persistent malignancy. Patient has refused PEG tube placement in the past. 5. Leukopenia: Resolved. Neulasta as above. 6. Anemia: Mild, monitor.  Patient expressed understanding and was in agreement with this plan. He also understands that He can call clinic at any time with any questions, concerns, or complaints.    Lloyd Huger, MD   05/06/2015 1:18 PM

## 2015-05-07 ENCOUNTER — Inpatient Hospital Stay: Payer: Medicare Other

## 2015-05-07 VITALS — BP 110/66 | HR 53 | Temp 98.0°F | Resp 18

## 2015-05-07 DIAGNOSIS — C159 Malignant neoplasm of esophagus, unspecified: Secondary | ICD-10-CM | POA: Diagnosis not present

## 2015-05-07 DIAGNOSIS — C155 Malignant neoplasm of lower third of esophagus: Secondary | ICD-10-CM

## 2015-05-07 DIAGNOSIS — C801 Malignant (primary) neoplasm, unspecified: Secondary | ICD-10-CM

## 2015-05-07 MED ORDER — HEPARIN SOD (PORK) LOCK FLUSH 100 UNIT/ML IV SOLN
INTRAVENOUS | Status: AC
  Filled 2015-05-07: qty 5

## 2015-05-07 MED ORDER — SODIUM CHLORIDE 0.9 % IJ SOLN
10.0000 mL | INTRAMUSCULAR | Status: DC | PRN
Start: 2015-05-07 — End: 2015-05-07
  Administered 2015-05-07: 10 mL via INTRAVENOUS
  Filled 2015-05-07: qty 10

## 2015-05-07 MED ORDER — PEGFILGRASTIM INJECTION 6 MG/0.6ML ~~LOC~~
6.0000 mg | PREFILLED_SYRINGE | Freq: Once | SUBCUTANEOUS | Status: AC
  Administered 2015-05-07: 6 mg via SUBCUTANEOUS
  Filled 2015-05-07: qty 0.6

## 2015-05-07 MED ORDER — HEPARIN SOD (PORK) LOCK FLUSH 100 UNIT/ML IV SOLN
500.0000 [IU] | Freq: Once | INTRAVENOUS | Status: AC
  Administered 2015-05-07: 500 [IU] via INTRAVENOUS

## 2015-05-11 ENCOUNTER — Other Ambulatory Visit: Payer: Self-pay | Admitting: Oncology

## 2015-05-11 ENCOUNTER — Inpatient Hospital Stay: Payer: Medicare Other

## 2015-05-11 VITALS — BP 126/74 | HR 96 | Temp 97.4°F | Resp 18

## 2015-05-11 DIAGNOSIS — C159 Malignant neoplasm of esophagus, unspecified: Secondary | ICD-10-CM

## 2015-05-11 DIAGNOSIS — C155 Malignant neoplasm of lower third of esophagus: Secondary | ICD-10-CM

## 2015-05-11 MED ORDER — SODIUM CHLORIDE 0.9 % IV SOLN
Freq: Once | INTRAVENOUS | Status: AC
  Administered 2015-05-11: 11:00:00 via INTRAVENOUS
  Filled 2015-05-11: qty 1000

## 2015-05-11 MED ORDER — SODIUM CHLORIDE 0.9 % IV SOLN
Freq: Once | INTRAVENOUS | Status: AC
  Administered 2015-05-11: 11:00:00 via INTRAVENOUS
  Filled 2015-05-11: qty 4

## 2015-05-11 MED ORDER — HEPARIN SOD (PORK) LOCK FLUSH 100 UNIT/ML IV SOLN
INTRAVENOUS | Status: AC
  Filled 2015-05-11: qty 5

## 2015-05-11 MED ORDER — HEPARIN SOD (PORK) LOCK FLUSH 100 UNIT/ML IV SOLN
500.0000 [IU] | Freq: Once | INTRAVENOUS | Status: AC
  Administered 2015-05-11: 500 [IU] via INTRAVENOUS

## 2015-05-19 ENCOUNTER — Inpatient Hospital Stay: Payer: Medicare Other

## 2015-05-19 ENCOUNTER — Inpatient Hospital Stay (HOSPITAL_BASED_OUTPATIENT_CLINIC_OR_DEPARTMENT_OTHER): Payer: Medicare Other | Admitting: Oncology

## 2015-05-19 VITALS — BP 114/64 | HR 76 | Temp 97.9°F | Resp 18 | Wt 231.3 lb

## 2015-05-19 DIAGNOSIS — C439 Malignant melanoma of skin, unspecified: Secondary | ICD-10-CM

## 2015-05-19 DIAGNOSIS — G4730 Sleep apnea, unspecified: Secondary | ICD-10-CM

## 2015-05-19 DIAGNOSIS — I482 Chronic atrial fibrillation, unspecified: Secondary | ICD-10-CM

## 2015-05-19 DIAGNOSIS — Z79899 Other long term (current) drug therapy: Secondary | ICD-10-CM

## 2015-05-19 DIAGNOSIS — I4819 Other persistent atrial fibrillation: Secondary | ICD-10-CM

## 2015-05-19 DIAGNOSIS — Z7901 Long term (current) use of anticoagulants: Secondary | ICD-10-CM

## 2015-05-19 DIAGNOSIS — R0681 Apnea, not elsewhere classified: Secondary | ICD-10-CM

## 2015-05-19 DIAGNOSIS — R1310 Dysphagia, unspecified: Secondary | ICD-10-CM

## 2015-05-19 DIAGNOSIS — I4891 Unspecified atrial fibrillation: Secondary | ICD-10-CM

## 2015-05-19 DIAGNOSIS — I472 Ventricular tachycardia, unspecified: Secondary | ICD-10-CM

## 2015-05-19 DIAGNOSIS — D649 Anemia, unspecified: Secondary | ICD-10-CM

## 2015-05-19 DIAGNOSIS — I10 Essential (primary) hypertension: Secondary | ICD-10-CM

## 2015-05-19 DIAGNOSIS — J690 Pneumonitis due to inhalation of food and vomit: Secondary | ICD-10-CM

## 2015-05-19 DIAGNOSIS — E876 Hypokalemia: Secondary | ICD-10-CM

## 2015-05-19 DIAGNOSIS — C155 Malignant neoplasm of lower third of esophagus: Secondary | ICD-10-CM

## 2015-05-19 DIAGNOSIS — B0229 Other postherpetic nervous system involvement: Secondary | ICD-10-CM

## 2015-05-19 DIAGNOSIS — M48061 Spinal stenosis, lumbar region without neurogenic claudication: Secondary | ICD-10-CM

## 2015-05-19 DIAGNOSIS — R5383 Other fatigue: Secondary | ICD-10-CM

## 2015-05-19 DIAGNOSIS — IMO0001 Reserved for inherently not codable concepts without codable children: Secondary | ICD-10-CM

## 2015-05-19 DIAGNOSIS — N400 Enlarged prostate without lower urinary tract symptoms: Secondary | ICD-10-CM

## 2015-05-19 DIAGNOSIS — D696 Thrombocytopenia, unspecified: Secondary | ICD-10-CM | POA: Diagnosis not present

## 2015-05-19 DIAGNOSIS — R5381 Other malaise: Secondary | ICD-10-CM

## 2015-05-19 DIAGNOSIS — Z7689 Persons encountering health services in other specified circumstances: Secondary | ICD-10-CM | POA: Diagnosis not present

## 2015-05-19 DIAGNOSIS — C159 Malignant neoplasm of esophagus, unspecified: Secondary | ICD-10-CM

## 2015-05-19 DIAGNOSIS — A419 Sepsis, unspecified organism: Secondary | ICD-10-CM

## 2015-05-19 DIAGNOSIS — R0789 Other chest pain: Secondary | ICD-10-CM

## 2015-05-19 LAB — CBC WITH DIFFERENTIAL/PLATELET
BASOS PCT: 1 %
Basophils Absolute: 0.1 10*3/uL (ref 0–0.1)
Eosinophils Absolute: 0.1 10*3/uL (ref 0–0.7)
Eosinophils Relative: 2 %
HEMATOCRIT: 38.4 % — AB (ref 40.0–52.0)
Hemoglobin: 13 g/dL (ref 13.0–18.0)
LYMPHS ABS: 1.4 10*3/uL (ref 1.0–3.6)
Lymphocytes Relative: 17 %
MCH: 31 pg (ref 26.0–34.0)
MCHC: 33.7 g/dL (ref 32.0–36.0)
MCV: 91.9 fL (ref 80.0–100.0)
MONO ABS: 0.9 10*3/uL (ref 0.2–1.0)
MONOS PCT: 11 %
NEUTROS ABS: 5.9 10*3/uL (ref 1.4–6.5)
Neutrophils Relative %: 69 %
Platelets: 141 10*3/uL — ABNORMAL LOW (ref 150–440)
RBC: 4.18 MIL/uL — ABNORMAL LOW (ref 4.40–5.90)
RDW: 20 % — AB (ref 11.5–14.5)
WBC: 8.4 10*3/uL (ref 3.8–10.6)

## 2015-05-19 LAB — COMPREHENSIVE METABOLIC PANEL
ALBUMIN: 4 g/dL (ref 3.5–5.0)
ALK PHOS: 96 U/L (ref 38–126)
ALT: 19 U/L (ref 17–63)
ANION GAP: 6 (ref 5–15)
AST: 21 U/L (ref 15–41)
BUN: 14 mg/dL (ref 6–20)
CALCIUM: 8.8 mg/dL — AB (ref 8.9–10.3)
CHLORIDE: 102 mmol/L (ref 101–111)
CO2: 27 mmol/L (ref 22–32)
Creatinine, Ser: 0.97 mg/dL (ref 0.61–1.24)
GFR calc Af Amer: >60 mL/min (ref >60)
GFR calc non Af Amer: >60 mL/min (ref >60)
GLUCOSE: 116 mg/dL — AB (ref 65–99)
Potassium: 3.9 mmol/L (ref 3.5–5.1)
SODIUM: 135 mmol/L (ref 135–145)
Total Bilirubin: 0.5 mg/dL (ref 0.3–1.2)
Total Protein: 6.6 g/dL (ref 6.5–8.1)

## 2015-05-19 MED ORDER — FLUOROURACIL CHEMO INJECTION 2.5 GM/50ML
400.0000 mg/m2 | Freq: Once | INTRAVENOUS | Status: AC
  Administered 2015-05-19: 900 mg via INTRAVENOUS
  Filled 2015-05-19: qty 18

## 2015-05-19 MED ORDER — SODIUM CHLORIDE 0.9 % IV SOLN
Freq: Once | INTRAVENOUS | Status: AC
  Administered 2015-05-19: 11:00:00 via INTRAVENOUS
  Filled 2015-05-19: qty 1000

## 2015-05-19 MED ORDER — LEUCOVORIN CALCIUM INJECTION 350 MG: 400.0000 mg/m2 | Freq: Once | INTRAVENOUS | Status: DC

## 2015-05-19 MED ORDER — IRINOTECAN HCL CHEMO INJECTION 100 MG/5ML
400.0000 mg | Freq: Once | INTRAVENOUS | Status: AC
  Administered 2015-05-19: 400 mg via INTRAVENOUS
  Filled 2015-05-19: qty 6.67

## 2015-05-19 MED ORDER — ATROPINE SULFATE 1 MG/ML IJ SOLN
0.5000 mg | Freq: Once | INTRAMUSCULAR | Status: AC | PRN
  Administered 2015-05-19: 0.5 mg via INTRAVENOUS
  Filled 2015-05-19: qty 1

## 2015-05-19 MED ORDER — HEPARIN SOD (PORK) LOCK FLUSH 100 UNIT/ML IV SOLN
500.0000 [IU] | Freq: Once | INTRAVENOUS | Status: DC
  Filled 2015-05-19: qty 5

## 2015-05-19 MED ORDER — SODIUM CHLORIDE 0.9 % IV SOLN
Freq: Once | INTRAVENOUS | Status: AC
  Administered 2015-05-19: 11:00:00 via INTRAVENOUS
  Filled 2015-05-19: qty 8

## 2015-05-19 MED ORDER — LEUCOVORIN CALCIUM INJECTION 100 MG
20.0000 mg/m2 | Freq: Once | INTRAMUSCULAR | Status: AC
  Administered 2015-05-19: 46 mg via INTRAVENOUS
  Filled 2015-05-19: qty 2.3

## 2015-05-19 MED ORDER — FLUOROURACIL CHEMO INJECTION 5 GM/100ML
2400.0000 mg/m2 | INTRAVENOUS | Status: DC
  Administered 2015-05-19: 5400 mg via INTRAVENOUS
  Filled 2015-05-19: qty 108

## 2015-05-19 MED ORDER — SODIUM CHLORIDE 0.9% FLUSH
10.0000 mL | INTRAVENOUS | Status: DC | PRN
Start: 2015-05-19 — End: 2015-05-19
  Administered 2015-05-19: 10 mL via INTRAVENOUS
  Filled 2015-05-19: qty 10

## 2015-05-19 NOTE — Progress Notes (Signed)
Jeremy [facility]  [phone]) [phone] [phone]) [phone]  ID: Jeremy Gordon OB: 1941-02-10  MR#: [id number]  BHA#:[id number]  Patient Care Team: Jeremy Ohms, MD as PCP - General (Geriatric Medicine) No Pcp Per Patient (General Practice)  CHIEF COMPLAINT:  Chief Complaint  Patient presents with  . Esophageal Cancer    INTERVAL HISTORY: Patient returns to clinic today for further evaluation and consideration of cycle 6 of FOLFIRI.  He does not complain of weakness or fatigue today. Patient does complain of trouble swallowing for the past 3 days. He has no neurologic complaints. He denies any recent fevers. He denies any chest pain, shortness of breath, or cough. He denies any nausea, vomiting, constipation, or diarrhea. He has no melena or hematochezia. He has no urinary complaints. Patient offers no specific complaints today.  REVIEW OF SYSTEMS:   Review of Systems  Constitutional: Negative for fever, weight loss and malaise/fatigue.  HENT: Negative.   Respiratory: Negative.  Negative for shortness of breath.   Cardiovascular: Negative.  Negative for chest pain.  Musculoskeletal: Negative.   Neurological: Negative.  Negative for weakness.  Endo/Heme/Allergies: Does not bruise/bleed easily.    As per HPI. Otherwise, a complete review of systems is negatve.  PAST MEDICAL HISTORY:  Hypertension, sleep apnea, atrial fibrillation.  PAST SURGICAL HISTORY: Negative.  FAMILY HISTORY: Reviewed and unchanged. No report of malignancy or chronic disease.     ADVANCED DIRECTIVES:    HEALTH MAINTENANCE: Social History  Substance Use Topics  . Smoking status: Current Some Day Smoker -- 62 years  . Smokeless tobacco: Current User    Types: Snuff  . Alcohol Use: No     Colonoscopy:  PAP:  Bone density:  Lipid panel:  Not on File  Current Outpatient Prescriptions  Medication Sig Dispense Refill  . albuterol (PROVENTIL HFA;VENTOLIN HFA) 108 (90 BASE)  MCG/ACT inhaler Inhale 2 puffs into the lungs every 4 (four) hours as needed for wheezing or shortness of breath.    . digoxin (LANOXIN) 0.125 MG tablet Take 1 tablet (0.125 mg total) by mouth daily. 30 tablet 0  . diltiazem (CARDIZEM CD) 360 MG 24 hr capsule Take 1 capsule (360 mg total) by mouth daily. 30 capsule 0  . enalapril (VASOTEC) 20 MG tablet Take 20 mg by mouth 2 (two) times daily.    . finasteride (PROSCAR) 5 MG tablet Take 5 mg by mouth daily.    Marland Kitchen gabapentin (NEURONTIN) 300 MG capsule Take 300 mg by mouth 2 (two) times daily.    Marland Kitchen HYDROcodone-acetaminophen (NORCO) 10-325 MG tablet Take 1 tablet by mouth every 6 (six) hours as needed for severe pain. 20 tablet 0  . levofloxacin (LEVAQUIN) 500 MG tablet Take 1 tablet (500 mg total) by mouth daily. (Patient not taking: Reported on 04/07/2015) 2 tablet 0  . magic mouthwash SOLN Take 5-10 mLs by mouth 4 (four) times daily.    . mupirocin ointment (BACTROBAN) 2 % Apply threes times a day for 5 days (Patient not taking: Reported on 05/05/2015) 22 g 0  . omeprazole (PRILOSEC) 20 MG capsule Take 20 mg by mouth 2 (two) times daily.    . sucralfate (CARAFATE) 1 GM/10ML suspension Take 1 g by mouth 2 (two) times daily.    Marland Kitchen warfarin (COUMADIN) 4 MG tablet Take 2 tablets (8 mg total) by mouth every evening. 60 tablet 0   No current facility-administered medications for this visit.   Facility-Administered Medications Ordered in Other Visits  Medication Dose Route  Frequency Provider Last Rate Last Dose  . 0.9 %  sodium chloride infusion   Intravenous Once Jeremy Huger, MD      . heparin lock flush 100 unit/mL  500 Units Intravenous Once Jeremy Huger, MD      . sodium chloride 0.9 % injection 10 mL  10 mL Intravenous PRN Jeremy Huger, MD   10 mL at 12/23/14 0945  . sodium chloride 0.9 % injection 10 mL  10 mL Intracatheter PRN Jeremy Huger, MD   10 mL at 12/30/14 0947  . sodium chloride 0.9 % injection 10 mL  10 mL  Intravenous PRN Jeremy Huger, MD   10 mL at 01/15/15 1512  . sodium chloride flush (NS) 0.9 % injection 10 mL  10 mL Intravenous PRN Jeremy Huger, MD   10 mL at 05/19/15 0955    OBJECTIVE: Filed Vitals:   05/19/15 1012  BP: 114/64  Pulse: 76  Temp: 97.9 F (36.6 C)  Resp: 18     Body mass index is 36.21 kg/(m^2).    ECOG FS:1 - Symptomatic but completely ambulatory  General: Well-developed, well-nourished, no acute distress. Eyes: anicteric sclera. Lungs: Clear to auscultation bilaterally. Heart: Regular rate and rhythm. No rubs, murmurs, or gallops. Abdomen: Soft, nontender, nondistended. No organomegaly noted, normoactive bowel sounds. Musculoskeletal: No edema, cyanosis, or clubbing. Neuro: Alert, answering all questions appropriately. Cranial nerves grossly intact. Skin: No rashes or petechiae noted. Psych: Normal affect.   LAB RESULTS:  Lab Results  Component Value Date   NA 135 05/19/2015   K 3.9 05/19/2015   CL 102 05/19/2015   CO2 27 05/19/2015   GLUCOSE 116* 05/19/2015   BUN 14 05/19/2015   CREATININE 0.97 05/19/2015   CALCIUM 8.8* 05/19/2015   PROT 6.6 05/19/2015   ALBUMIN 4.0 05/19/2015   AST 21 05/19/2015   ALT 19 05/19/2015   ALKPHOS 96 05/19/2015   BILITOT 0.5 05/19/2015   GFRNONAA >60 05/19/2015   GFRAA >60 05/19/2015    Lab Results  Component Value Date   WBC 8.4 05/19/2015   NEUTROABS 5.9 05/19/2015   HGB 13.0 05/19/2015   HCT 38.4* 05/19/2015   MCV 91.9 05/19/2015   PLT 141* 05/19/2015     STUDIES: No results found.  ASSESSMENT: Recurrent adenocarcinoma of the esophagus, HER-2 negative.  PLAN:    1. Esophageal cancer: CT results from February 25, 2015 reviewed independently and reported with slight improved of disease.  Proceed with cycle 6 of FOLFIRI.  Plan to give treatment every 2 weeks. Will reimage prior to next visit. Will also continue to use Neulasta with his treatments. Return to clinic in 2 days for pump removal  and Neulasta then in 2 weeks for consideration of cycle 7 and discussion of his imaging results. 2. Atrial fibrillation: Continue Coumadin as prescribed. INRs are monitored by his primary care physician. 3. Thrombocytopenia: Patient's platelet count dropped to 141 today. Monitor 4. Difficulty swallowing: Secondary to persistent malignancy. Patient has refused PEG tube placement in the past. Will reimage before next cycle.  5. Leukopenia: Resolved. Neulasta as above. 6. Anemia: HGB WNL today.   Patient expressed understanding and was in agreement with this plan. He also understands that He can call clinic at any time with any questions, concerns, or complaints.    Mayra Reel, NP   05/19/2015 10:46 AM

## 2015-05-19 NOTE — Progress Notes (Signed)
For the past 2-3 days he will have epigastric pain when drinking but has no problem with solid.  Was drinking buttermilk a couple days ago and was hurting so bad he actually vomited.  Is belching a lot.  Not constipated but when he has a bowel movement it burns.

## 2015-05-21 ENCOUNTER — Inpatient Hospital Stay: Payer: Medicare Other | Attending: Oncology

## 2015-05-21 VITALS — BP 104/65 | HR 109 | Temp 97.0°F | Resp 18

## 2015-05-21 DIAGNOSIS — R110 Nausea: Secondary | ICD-10-CM | POA: Diagnosis not present

## 2015-05-21 DIAGNOSIS — I10 Essential (primary) hypertension: Secondary | ICD-10-CM | POA: Diagnosis not present

## 2015-05-21 DIAGNOSIS — I4891 Unspecified atrial fibrillation: Secondary | ICD-10-CM | POA: Insufficient documentation

## 2015-05-21 DIAGNOSIS — I517 Cardiomegaly: Secondary | ICD-10-CM | POA: Insufficient documentation

## 2015-05-21 DIAGNOSIS — Z7901 Long term (current) use of anticoagulants: Secondary | ICD-10-CM | POA: Diagnosis not present

## 2015-05-21 DIAGNOSIS — Z7689 Persons encountering health services in other specified circumstances: Secondary | ICD-10-CM | POA: Insufficient documentation

## 2015-05-21 DIAGNOSIS — R079 Chest pain, unspecified: Secondary | ICD-10-CM | POA: Insufficient documentation

## 2015-05-21 DIAGNOSIS — C159 Malignant neoplasm of esophagus, unspecified: Secondary | ICD-10-CM | POA: Insufficient documentation

## 2015-05-21 DIAGNOSIS — F17210 Nicotine dependence, cigarettes, uncomplicated: Secondary | ICD-10-CM | POA: Insufficient documentation

## 2015-05-21 DIAGNOSIS — Z79899 Other long term (current) drug therapy: Secondary | ICD-10-CM | POA: Insufficient documentation

## 2015-05-21 DIAGNOSIS — I6529 Occlusion and stenosis of unspecified carotid artery: Secondary | ICD-10-CM | POA: Diagnosis not present

## 2015-05-21 DIAGNOSIS — G4730 Sleep apnea, unspecified: Secondary | ICD-10-CM | POA: Diagnosis not present

## 2015-05-21 DIAGNOSIS — Z5111 Encounter for antineoplastic chemotherapy: Secondary | ICD-10-CM | POA: Insufficient documentation

## 2015-05-21 DIAGNOSIS — C155 Malignant neoplasm of lower third of esophagus: Secondary | ICD-10-CM

## 2015-05-21 MED ORDER — PEGFILGRASTIM INJECTION 6 MG/0.6ML ~~LOC~~
6.0000 mg | PREFILLED_SYRINGE | Freq: Once | SUBCUTANEOUS | Status: AC
  Administered 2015-05-21: 6 mg via SUBCUTANEOUS
  Filled 2015-05-21: qty 0.6

## 2015-05-21 MED ORDER — HEPARIN SOD (PORK) LOCK FLUSH 100 UNIT/ML IV SOLN
INTRAVENOUS | Status: AC
  Filled 2015-05-21: qty 5

## 2015-05-21 MED ORDER — SODIUM CHLORIDE 0.9 % IJ SOLN
10.0000 mL | INTRAMUSCULAR | Status: DC | PRN
  Administered 2015-05-21: 10 mL
  Filled 2015-05-21: qty 10

## 2015-05-21 MED ORDER — HEPARIN SOD (PORK) LOCK FLUSH 100 UNIT/ML IV SOLN
500.0000 [IU] | Freq: Once | INTRAVENOUS | Status: AC | PRN
  Administered 2015-05-21: 500 [IU]

## 2015-05-25 ENCOUNTER — Inpatient Hospital Stay: Payer: Medicare Other

## 2015-05-25 ENCOUNTER — Other Ambulatory Visit: Payer: Self-pay | Admitting: *Deleted

## 2015-05-25 DIAGNOSIS — C159 Malignant neoplasm of esophagus, unspecified: Secondary | ICD-10-CM | POA: Diagnosis not present

## 2015-05-25 MED ORDER — SODIUM CHLORIDE 0.9% FLUSH
10.0000 mL | Freq: Once | INTRAVENOUS | Status: AC
  Administered 2015-05-25: 10 mL via INTRAVENOUS
  Filled 2015-05-25: qty 10

## 2015-05-25 MED ORDER — SODIUM CHLORIDE 0.9 % IV SOLN
Freq: Once | INTRAVENOUS | Status: AC
  Administered 2015-05-25: 10:00:00 via INTRAVENOUS
  Filled 2015-05-25: qty 1000

## 2015-05-25 MED ORDER — HEPARIN SOD (PORK) LOCK FLUSH 100 UNIT/ML IV SOLN
500.0000 [IU] | Freq: Once | INTRAVENOUS | Status: AC
  Administered 2015-05-25: 500 [IU] via INTRAVENOUS
  Filled 2015-05-25: qty 5

## 2015-05-29 ENCOUNTER — Ambulatory Visit
Admission: RE | Admit: 2015-05-29 | Discharge: 2015-05-29 | Disposition: A | Discharge status: Home / Self Care | Payer: Medicare Other | Source: Ambulatory Visit | Attending: Oncology | Admitting: Oncology

## 2015-05-29 DIAGNOSIS — R590 Localized enlarged lymph nodes: Secondary | ICD-10-CM | POA: Insufficient documentation

## 2015-05-29 DIAGNOSIS — C159 Malignant neoplasm of esophagus, unspecified: Secondary | ICD-10-CM | POA: Diagnosis not present

## 2015-05-29 MED ORDER — IOHEXOL 350 MG/ML SOLN
75.0000 mL | Freq: Once | INTRAVENOUS | Status: AC | PRN
  Administered 2015-05-29: 75 mL via INTRAVENOUS

## 2015-06-02 ENCOUNTER — Other Ambulatory Visit: Payer: Self-pay

## 2015-06-02 ENCOUNTER — Inpatient Hospital Stay (HOSPITAL_BASED_OUTPATIENT_CLINIC_OR_DEPARTMENT_OTHER): Payer: Medicare Other | Admitting: Oncology

## 2015-06-02 ENCOUNTER — Inpatient Hospital Stay: Payer: Medicare Other

## 2015-06-02 VITALS — BP 137/78 | HR 90 | Temp 97.8°F | Resp 18 | Wt 233.2 lb

## 2015-06-02 DIAGNOSIS — I517 Cardiomegaly: Secondary | ICD-10-CM

## 2015-06-02 DIAGNOSIS — Z79899 Other long term (current) drug therapy: Secondary | ICD-10-CM

## 2015-06-02 DIAGNOSIS — G4730 Sleep apnea, unspecified: Secondary | ICD-10-CM

## 2015-06-02 DIAGNOSIS — R110 Nausea: Secondary | ICD-10-CM

## 2015-06-02 DIAGNOSIS — Z7689 Persons encountering health services in other specified circumstances: Secondary | ICD-10-CM | POA: Diagnosis not present

## 2015-06-02 DIAGNOSIS — M48061 Spinal stenosis, lumbar region without neurogenic claudication: Secondary | ICD-10-CM

## 2015-06-02 DIAGNOSIS — I4891 Unspecified atrial fibrillation: Secondary | ICD-10-CM

## 2015-06-02 DIAGNOSIS — C155 Malignant neoplasm of lower third of esophagus: Secondary | ICD-10-CM

## 2015-06-02 DIAGNOSIS — N400 Enlarged prostate without lower urinary tract symptoms: Secondary | ICD-10-CM

## 2015-06-02 DIAGNOSIS — Z7901 Long term (current) use of anticoagulants: Secondary | ICD-10-CM

## 2015-06-02 DIAGNOSIS — I6529 Occlusion and stenosis of unspecified carotid artery: Secondary | ICD-10-CM

## 2015-06-02 DIAGNOSIS — C159 Malignant neoplasm of esophagus, unspecified: Secondary | ICD-10-CM

## 2015-06-02 DIAGNOSIS — R5383 Other fatigue: Secondary | ICD-10-CM

## 2015-06-02 DIAGNOSIS — I482 Chronic atrial fibrillation, unspecified: Secondary | ICD-10-CM

## 2015-06-02 DIAGNOSIS — IMO0001 Reserved for inherently not codable concepts without codable children: Secondary | ICD-10-CM

## 2015-06-02 DIAGNOSIS — F17210 Nicotine dependence, cigarettes, uncomplicated: Secondary | ICD-10-CM

## 2015-06-02 DIAGNOSIS — R0681 Apnea, not elsewhere classified: Secondary | ICD-10-CM

## 2015-06-02 DIAGNOSIS — I4819 Other persistent atrial fibrillation: Secondary | ICD-10-CM

## 2015-06-02 DIAGNOSIS — E876 Hypokalemia: Secondary | ICD-10-CM

## 2015-06-02 DIAGNOSIS — R0789 Other chest pain: Secondary | ICD-10-CM

## 2015-06-02 DIAGNOSIS — R5381 Other malaise: Secondary | ICD-10-CM

## 2015-06-02 DIAGNOSIS — C439 Malignant melanoma of skin, unspecified: Secondary | ICD-10-CM

## 2015-06-02 DIAGNOSIS — R079 Chest pain, unspecified: Secondary | ICD-10-CM | POA: Diagnosis not present

## 2015-06-02 DIAGNOSIS — I472 Ventricular tachycardia, unspecified: Secondary | ICD-10-CM

## 2015-06-02 DIAGNOSIS — I10 Essential (primary) hypertension: Secondary | ICD-10-CM

## 2015-06-02 DIAGNOSIS — J690 Pneumonitis due to inhalation of food and vomit: Secondary | ICD-10-CM

## 2015-06-02 DIAGNOSIS — B0229 Other postherpetic nervous system involvement: Secondary | ICD-10-CM

## 2015-06-02 DIAGNOSIS — A419 Sepsis, unspecified organism: Secondary | ICD-10-CM

## 2015-06-02 LAB — COMPREHENSIVE METABOLIC PANEL WITH GFR
ALT: 20 U/L (ref 17–63)
AST: 26 U/L (ref 15–41)
Albumin: 3.9 g/dL (ref 3.5–5.0)
Alkaline Phosphatase: 96 U/L (ref 38–126)
Anion gap: 6 (ref 5–15)
BUN: 13 mg/dL (ref 6–20)
CO2: 27 mmol/L (ref 22–32)
Calcium: 9 mg/dL (ref 8.9–10.3)
Chloride: 104 mmol/L (ref 101–111)
Creatinine, Ser: 1 mg/dL (ref 0.61–1.24)
GFR calc Af Amer: >60 mL/min
GFR calc non Af Amer: >60 mL/min
Glucose, Bld: 120 mg/dL — ABNORMAL HIGH (ref 65–99)
Potassium: 3.9 mmol/L (ref 3.5–5.1)
Sodium: 137 mmol/L (ref 135–145)
Total Bilirubin: 0.6 mg/dL (ref 0.3–1.2)
Total Protein: 6.3 g/dL — ABNORMAL LOW (ref 6.5–8.1)

## 2015-06-02 LAB — CBC WITH DIFFERENTIAL/PLATELET
BASOS PCT: 1 %
Basophils Absolute: 0.1 10*3/uL (ref 0–0.1)
EOS ABS: 0.1 10*3/uL (ref 0–0.7)
EOS PCT: 2 %
HCT: 38.6 % — ABNORMAL LOW (ref 40.0–52.0)
Hemoglobin: 13 g/dL (ref 13.0–18.0)
Lymphocytes Relative: 15 %
Lymphs Abs: 1.2 10*3/uL (ref 1.0–3.6)
MCH: 31.2 pg (ref 26.0–34.0)
MCHC: 33.7 g/dL (ref 32.0–36.0)
MCV: 92.7 fL (ref 80.0–100.0)
MONO ABS: 0.8 10*3/uL (ref 0.2–1.0)
MONOS PCT: 10 %
NEUTROS PCT: 72 %
Neutro Abs: 5.7 10*3/uL (ref 1.4–6.5)
PLATELETS: 116 10*3/uL — AB (ref 150–440)
RBC: 4.16 MIL/uL — ABNORMAL LOW (ref 4.40–5.90)
RDW: 19.9 % — AB (ref 11.5–14.5)
WBC: 7.9 10*3/uL (ref 3.8–10.6)

## 2015-06-02 MED ORDER — LEUCOVORIN CALCIUM INJECTION 100 MG: 20.0000 mg/m2 | Freq: Once | INTRAMUSCULAR | Status: DC

## 2015-06-02 MED ORDER — HEPARIN SOD (PORK) LOCK FLUSH 100 UNIT/ML IV SOLN: 500.0000 [IU] | Freq: Once | INTRAVENOUS | Status: DC | PRN

## 2015-06-02 MED ORDER — SODIUM CHLORIDE 0.9 % IV SOLN
2400.0000 mg/m2 | INTRAVENOUS | Status: DC
  Administered 2015-06-02: 5400 mg via INTRAVENOUS
  Filled 2015-06-02: qty 108

## 2015-06-02 MED ORDER — MAGIC MOUTHWASH: 5.0000 mL | Freq: Four times a day (QID) | ORAL | Status: DC

## 2015-06-02 MED ORDER — IRINOTECAN HCL CHEMO INJECTION 100 MG/5ML
400.0000 mg | Freq: Once | INTRAVENOUS | Status: AC
  Administered 2015-06-02: 400 mg via INTRAVENOUS
  Filled 2015-06-02: qty 6.67

## 2015-06-02 MED ORDER — ATROPINE SULFATE 1 MG/ML IJ SOLN
0.5000 mg | Freq: Once | INTRAMUSCULAR | Status: AC | PRN
  Administered 2015-06-02: 0.5 mg via INTRAVENOUS
  Filled 2015-06-02: qty 1

## 2015-06-02 MED ORDER — SUCRALFATE 1 GM/10ML PO SUSP: 1.0000 g | Freq: Two times a day (BID) | ORAL | Status: DC

## 2015-06-02 MED ORDER — SODIUM CHLORIDE 0.9% FLUSH
10.0000 mL | INTRAVENOUS | Status: DC | PRN
  Administered 2015-06-02: 10 mL via INTRAVENOUS
  Filled 2015-06-02: qty 10

## 2015-06-02 MED ORDER — SODIUM CHLORIDE 0.9 % IV SOLN
Freq: Once | INTRAVENOUS | Status: AC
  Administered 2015-06-02: 11:00:00 via INTRAVENOUS
  Filled 2015-06-02: qty 1000

## 2015-06-02 MED ORDER — LEUCOVORIN CALCIUM INJECTION 350 MG
400.0000 mg/m2 | Freq: Once | INTRAMUSCULAR | Status: AC
  Administered 2015-06-02: 900 mg via INTRAVENOUS
  Filled 2015-06-02: qty 45

## 2015-06-02 MED ORDER — SODIUM CHLORIDE 0.9 % IV SOLN
Freq: Once | INTRAVENOUS | Status: AC
  Administered 2015-06-02: 12:00:00 via INTRAVENOUS
  Filled 2015-06-02: qty 8

## 2015-06-02 MED ORDER — FLUOROURACIL CHEMO INJECTION 2.5 GM/50ML
400.0000 mg/m2 | Freq: Once | INTRAVENOUS | Status: AC
  Administered 2015-06-02: 900 mg via INTRAVENOUS
  Filled 2015-06-02: qty 18

## 2015-06-02 MED ORDER — HEPARIN SOD (PORK) LOCK FLUSH 100 UNIT/ML IV SOLN: 500.0000 [IU] | Freq: Once | INTRAVENOUS | Status: DC

## 2015-06-02 NOTE — Progress Notes (Signed)
Patient is having esophageal pain when eating and does have episodes of vomiting with the pain.  For the last few nights he has left side chest pain when lying down that improves when he gets up with no other symptoms.  His pulse feels irregular and goes from 70-90 on pulse ox, does have history of a-fib.

## 2015-06-03 NOTE — Progress Notes (Signed)
  Oncology Nurse Navigator Documentation  Navigator Location: [facility] (06/03/15 1100) Navigator Encounter Type: Other (06/03/15 1100)               Barriers/Navigation Needs: Coordination of Care (06/03/15 1100)   Interventions: Referrals;Coordination of Care (06/03/15 1100)                      Time Spent with Patient: 30 (06/03/15 1100)   Referral sent to [facility] for EGD for Dr Elspeth Cho

## 2015-06-04 ENCOUNTER — Inpatient Hospital Stay: Payer: Medicare Other

## 2015-06-04 VITALS — BP 113/73 | HR 74 | Temp 96.8°F | Resp 18

## 2015-06-04 DIAGNOSIS — C155 Malignant neoplasm of lower third of esophagus: Secondary | ICD-10-CM

## 2015-06-04 DIAGNOSIS — C159 Malignant neoplasm of esophagus, unspecified: Secondary | ICD-10-CM | POA: Diagnosis not present

## 2015-06-04 MED ORDER — HEPARIN SOD (PORK) LOCK FLUSH 100 UNIT/ML IV SOLN
INTRAVENOUS | Status: AC
  Filled 2015-06-04: qty 5

## 2015-06-04 MED ORDER — PEGFILGRASTIM INJECTION 6 MG/0.6ML ~~LOC~~
6.0000 mg | PREFILLED_SYRINGE | Freq: Once | SUBCUTANEOUS | Status: AC
  Administered 2015-06-04: 6 mg via SUBCUTANEOUS
  Filled 2015-06-04: qty 0.6

## 2015-06-04 MED ORDER — HEPARIN SOD (PORK) LOCK FLUSH 100 UNIT/ML IV SOLN
500.0000 [IU] | Freq: Once | INTRAVENOUS | Status: AC | PRN
  Administered 2015-06-04: 500 [IU]

## 2015-06-04 MED ORDER — SODIUM CHLORIDE 0.9 % IJ SOLN
10.0000 mL | INTRAMUSCULAR | Status: DC | PRN
  Administered 2015-06-04: 10 mL
  Filled 2015-06-04: qty 10

## 2015-06-10 ENCOUNTER — Inpatient Hospital Stay: Payer: Medicare Other

## 2015-06-10 ENCOUNTER — Other Ambulatory Visit: Payer: Self-pay | Admitting: *Deleted

## 2015-06-10 MED ORDER — DIPHENOXYLATE-ATROPINE 2.5-0.025 MG PO TABS: 1.0000 | ORAL_TABLET | Freq: Four times a day (QID) | ORAL | Status: DC | PRN

## 2015-06-12 NOTE — Progress Notes (Signed)
[place]  Telephone:(336) [phone] Fax:(336) [phone]  ID: Jeremy Gordon OB: Aug 19, 1940  MR#: [id number]  STM#:[id number]  Patient Care Team: [name], MD as PCP - General (Geriatric Medicine) No Pcp Per Patient (General Practice)  CHIEF COMPLAINT:  Chief Complaint  Patient presents with  . Esophageal Cancer    INTERVAL HISTORY: Patient returns to clinic today for further evaluation, discussion of his imaging results, and consideration of cycle 7 of FOLFIRI.  He does not complain of weakness or fatigue today. He has occasional esophageal pain while eating. He also states he has occasional left-sided chest pain upon lying down which resolves when he stands up. He has no neurologic complaints. He denies any recent fevers. He denies any shortness of breath or cough. He denies any nausea, vomiting, constipation, or diarrhea. He has no melena or hematochezia. He has no urinary complaints. Patient offers no  further specific complaints today.  REVIEW OF SYSTEMS:   Review of Systems  Constitutional: Negative for fever, weight loss and malaise/fatigue.  HENT: Negative.   Respiratory: Negative.  Negative for shortness of breath.   Cardiovascular: Positive for chest pain.  Gastrointestinal: Positive for nausea. Negative for blood in stool and melena.  Musculoskeletal: Negative.   Neurological: Negative.  Negative for weakness.  Endo/Heme/Allergies: Does not bruise/bleed easily.    As per HPI. Otherwise, a complete review of systems is negatve.  PAST MEDICAL HISTORY:  Hypertension, sleep apnea, atrial fibrillation.  PAST SURGICAL HISTORY: Negative.  FAMILY HISTORY: Reviewed and unchanged. No report of malignancy or chronic disease.     ADVANCED DIRECTIVES:    HEALTH MAINTENANCE: Social History  Substance Use Topics  . Smoking status: Current Some Day Smoker -- 62 years  . Smokeless tobacco: Current User    Types: Snuff  . Alcohol Use: No      Colonoscopy:  PAP:  Bone density:  Lipid panel:  No Known Allergies  Current Outpatient Prescriptions  Medication Sig Dispense Refill  . albuterol (PROVENTIL HFA;VENTOLIN HFA) 108 (90 BASE) MCG/ACT inhaler Inhale 2 puffs into the lungs every 4 (four) hours as needed for wheezing or shortness of breath.    . digoxin (LANOXIN) 0.125 MG tablet Take 1 tablet (0.125 mg total) by mouth daily. 30 tablet 0  . diltiazem (CARDIZEM CD) 360 MG 24 hr capsule Take 1 capsule (360 mg total) by mouth daily. 30 capsule 0  . enalapril (VASOTEC) 20 MG tablet Take 20 mg by mouth 2 (two) times daily.    . finasteride (PROSCAR) 5 MG tablet Take 5 mg by mouth daily.    Marland Kitchen gabapentin (NEURONTIN) 300 MG capsule Take 300 mg by mouth 2 (two) times daily.    Marland Kitchen HYDROcodone-acetaminophen (NORCO) 10-325 MG tablet Take 1 tablet by mouth every 6 (six) hours as needed for severe pain. 20 tablet 0  . magic mouthwash SOLN Take 5-10 mLs by mouth 4 (four) times daily. 480 mL 2  . omeprazole (PRILOSEC) 20 MG capsule Take 20 mg by mouth 2 (two) times daily.    . sucralfate (CARAFATE) 1 GM/10ML suspension Take 10 mLs (1 g total) by mouth 2 (two) times daily. 420 mL 2  . warfarin (COUMADIN) 4 MG tablet Take 2 tablets (8 mg total) by mouth every evening. 60 tablet 0  . diphenoxylate-atropine (LOMOTIL) 2.5-0.025 MG tablet Take 1 tablet by mouth 4 (four) times daily as needed for diarrhea or loose stools. 30 tablet 0   No current facility-administered medications for this visit.  Facility-Administered Medications Ordered in Other Visits  Medication Dose Route Frequency Provider Last Rate Last Dose  . 0.9 %  sodium chloride infusion   Intravenous Once Lloyd Huger, MD      . sodium chloride 0.9 % injection 10 mL  10 mL Intravenous PRN Lloyd Huger, MD   10 mL at 12/23/14 0945  . sodium chloride 0.9 % injection 10 mL  10 mL Intracatheter PRN Lloyd Huger, MD   10 mL at 12/30/14 0947  . sodium chloride 0.9 %  injection 10 mL  10 mL Intravenous PRN Lloyd Huger, MD   10 mL at 01/15/15 1512    OBJECTIVE: Filed Vitals:   06/02/15 0950  BP: 137/78  Pulse: 90  Temp: 97.8 F (36.6 C)  Resp: 18     Body mass index is 36.52 kg/(m^2).    ECOG FS:1 - Symptomatic but completely ambulatory  General: Well-developed, well-nourished, no acute distress. Eyes: anicteric sclera. Lungs: Clear to auscultation bilaterally. Heart: Regular rate and rhythm. No rubs, murmurs, or gallops. Abdomen: Soft, nontender, nondistended. No organomegaly noted, normoactive bowel sounds. Musculoskeletal: No edema, cyanosis, or clubbing. Neuro: Alert, answering all questions appropriately. Cranial nerves grossly intact. Skin: No rashes or petechiae noted. Psych: Normal affect.   LAB RESULTS:  Lab Results  Component Value Date   NA 137 06/02/2015   K 3.9 06/02/2015   CL 104 06/02/2015   CO2 27 06/02/2015   GLUCOSE 120* 06/02/2015   BUN 13 06/02/2015   CREATININE 1.00 06/02/2015   CALCIUM 9.0 06/02/2015   PROT 6.3* 06/02/2015   ALBUMIN 3.9 06/02/2015   AST 26 06/02/2015   ALT 20 06/02/2015   ALKPHOS 96 06/02/2015   BILITOT 0.6 06/02/2015   GFRNONAA >60 06/02/2015   GFRAA >60 06/02/2015    Lab Results  Component Value Date   WBC 7.9 06/02/2015   NEUTROABS 5.7 06/02/2015   HGB 13.0 06/02/2015   HCT 38.6* 06/02/2015   MCV 92.7 06/02/2015   PLT 116* 06/02/2015     STUDIES: Ct Soft Tissue Neck W Contrast  05/29/2015  CLINICAL DATA:  75 year old male with esophageal cancer. Difficulty swallowing for 3 days. Subsequent encounter. EXAM: CT NECK WITH CONTRAST TECHNIQUE: Multidetector CT imaging of the neck was performed using the standard protocol following the bolus administration of intravenous contrast. CONTRAST:  66m OMNIPAQUE IOHEXOL 350 MG/ML SOLN in conjunction with contrast enhanced imaging of the chest reported separately. COMPARISON:  Neck CT 11/10/2014 FINDINGS: Pharynx and larynx: Larynx is  stable and within normal limits. Pharyngeal contours are stable and within normal limits. Negative parapharyngeal and retropharyngeal spaces. Left hypo pharyngeal and piriform sinus effacement unchanged. No cervical esophageal abnormality is evident. Salivary glands: Negative sublingual space, submandibular glands, and bilateral parotid glands. Thyroid: Negative. Lymph nodes: Maximal right level 4 lymph nodes situated adjacent to the right IJ are stable, individually 9-10 mm short axis. Small bilateral level 3 nodes are stable. Mildly increased but still normal size 5 mm left level IIb node on series 7, image 52. Other level 2 nodes are stable and normal. No level 1 or level 5 lymphadenopathy. Vascular: Extensive aortic arch, carotid bifurcation, and ICA siphon calcified atherosclerosis. Distal vertebral artery calcified plaque. Major vascular structures in the neck and at the skullbase remain patent. Right IJ approach porta cath partially visible. Limited intracranial: Negative. Visualized orbits: Negative. Mastoids and visualized paranasal sinuses: Chronic bilateral mastoid sclerosis. Chronic opacification of the left mastoids and a portion of the left tympanic  cavity appears unchanged. Negative visualized paranasal sinuses. Skeleton: Degenerative changes in the cervical spine. Absent dentition. No acute or suspicious osseous lesion in the neck. Upper chest: Reported separately today. IMPRESSION: 1. Maximal right level 4 lymph nodes at the thoracic inlet are stable since July. No acute or definite metastatic process in the neck. 2. Pharynx and cervical esophagus appear stable and within normal limits. Chest findings today are reported separately. 3. Chronic left middle ear/mastoid inflammation. 4. Extensive calcified aortic and carotid atherosclerosis. Electronically Signed   By: Genevie Ann M.D.   On: 05/29/2015 11:20   Ct Chest W Contrast  05/29/2015  CLINICAL DATA:  Followup esophageal cancer with chemotherapy  EXAM: CT CHEST WITH CONTRAST TECHNIQUE: Multidetector CT imaging of the chest was performed during intravenous contrast administration. CONTRAST:  12m OMNIPAQUE IOHEXOL 350 MG/ML SOLN COMPARISON:  02/25/2015 FINDINGS: Central line extends to the cavoatrial junction. Thyroid is normal. Thoracic aortic calcification again identified including aortic valve. Mild cardiac enlargement. No pericardial effusion of significance. No significant pleural effusion. Distal 6 cm of esophagus shows moderate wall thickening similar to prior study. Small nonpathologic mediastinal lymph nodes similar to prior study. Sub carinal lymph node measured at 9 mm previously measures 8 mm on the current study. Prior 10 mm right paratracheal lymph node is now 8-9 mm. Nodal tissue in the right hilum is less prominent. Images through the upper abdomen show no acute abnormalities. There are no acute musculoskeletal findings. No significant pulmonary parenchymal opacification. 15 mm subpleural bleb medial right lung base similar to prior study. IMPRESSION: Stable distal esophageal wall thickening. Mild decrease in the size of nonpathologic mediastinal lymph nodes. Electronically Signed   By: RSkipper ClicheM.D.   On: 05/29/2015 11:18    ASSESSMENT: Recurrent adenocarcinoma of the esophagus, HER-2 negative.  PLAN:    1. Esophageal cancer: CT results from May 29, 2015 reviewed independently and reported with stable esophageal thickening.  Proceed with cycle 7 of FOLFIRI. Given the stability of his disease, will give patient some time off from chemotherapy. Return to clinic in 2 days for pump removal and Neulasta then in 6 weeks for further evaluation and consideration of reinitiation of chemotherapy. 2. Atrial fibrillation: Continue Coumadin as prescribed. INRs are monitored by his primary care physician. 3. Thrombocytopenia: Patient's platelet count dropped to 116 today. Monitor 4. Difficulty swallowing: Improved, imaging as above.  Patient has refused PEG tube placement in the past.  5. Leukopenia: Resolved. Neulasta as above. 6. Anemia: HGB WNL today.   Patient expressed understanding and was in agreement with this plan. He also understands that He can call clinic at any time with any questions, concerns, or complaints.    TLloyd Huger MD   06/12/2015 9:52 AM

## 2015-06-16 ENCOUNTER — Telehealth: Payer: Self-pay | Admitting: *Deleted

## 2015-06-16 NOTE — Telephone Encounter (Signed)
Asking for an appt to see Dr Grayland Ormond tomorrow regarding his indigestion. When questioning what he has tried for it he admits that he has not been taking his med as directed, he has only taken one dose of his Carafate, and that was this morning and is not using Prilosec. Per Dr Grayland Ormond he needs to take his meds as ordered and then if that does not work, then he can be seen. Pt advised of this and verbalized that he will take his meds and call back if no improvemant

## 2015-06-19 ENCOUNTER — Telehealth: Payer: Self-pay

## 2015-06-19 ENCOUNTER — Ambulatory Visit: Payer: Self-pay

## 2015-06-19 ENCOUNTER — Inpatient Hospital Stay: Payer: Medicare Other

## 2015-06-19 MED ORDER — AZITHROMYCIN 500 MG PO TABS: 500.0000 mg | ORAL_TABLET | Freq: Every day | ORAL | Status: DC

## 2015-06-19 MED ORDER — SODIUM CHLORIDE 0.9 % IV SOLN
Freq: Once | INTRAVENOUS | Status: DC
  Filled 2015-06-19: qty 2

## 2015-06-19 MED ORDER — PREDNISONE 10 MG PO TABS: 10.0000 mg | ORAL_TABLET | Freq: Every day | ORAL | Status: DC

## 2015-06-19 NOTE — Telephone Encounter (Signed)
Patient walked in the [facility] office with concerns of congestion and requesting IV fluids because it "makes him feel better".  Patient has not received treatment in 3 weeks with no fever.  Magda Paganini advised patient to take antibiotic that was called in to pharmacy along with Mucinex.  Patient agreed and is going to pharmacy.

## 2015-06-19 NOTE — Progress Notes (Unsigned)
This encounter was created in error - please disregard.

## 2015-07-16 ENCOUNTER — Inpatient Hospital Stay (HOSPITAL_BASED_OUTPATIENT_CLINIC_OR_DEPARTMENT_OTHER): Payer: Medicare Other | Admitting: Oncology

## 2015-07-16 ENCOUNTER — Inpatient Hospital Stay: Payer: Medicare Other

## 2015-07-16 ENCOUNTER — Inpatient Hospital Stay: Payer: Medicare Other | Attending: Oncology

## 2015-07-16 VITALS — BP 88/57 | HR 65 | Temp 97.4°F | Resp 18 | Wt 237.4 lb

## 2015-07-16 VITALS — BP 105/62 | HR 72

## 2015-07-16 DIAGNOSIS — C159 Malignant neoplasm of esophagus, unspecified: Secondary | ICD-10-CM | POA: Diagnosis not present

## 2015-07-16 DIAGNOSIS — R1310 Dysphagia, unspecified: Secondary | ICD-10-CM | POA: Insufficient documentation

## 2015-07-16 DIAGNOSIS — D696 Thrombocytopenia, unspecified: Secondary | ICD-10-CM | POA: Diagnosis not present

## 2015-07-16 DIAGNOSIS — G4730 Sleep apnea, unspecified: Secondary | ICD-10-CM | POA: Insufficient documentation

## 2015-07-16 DIAGNOSIS — R531 Weakness: Secondary | ICD-10-CM | POA: Diagnosis not present

## 2015-07-16 DIAGNOSIS — Z87891 Personal history of nicotine dependence: Secondary | ICD-10-CM

## 2015-07-16 DIAGNOSIS — I4891 Unspecified atrial fibrillation: Secondary | ICD-10-CM | POA: Diagnosis not present

## 2015-07-16 DIAGNOSIS — R5383 Other fatigue: Secondary | ICD-10-CM

## 2015-07-16 DIAGNOSIS — I10 Essential (primary) hypertension: Secondary | ICD-10-CM | POA: Diagnosis not present

## 2015-07-16 DIAGNOSIS — R1013 Epigastric pain: Secondary | ICD-10-CM | POA: Diagnosis not present

## 2015-07-16 DIAGNOSIS — C155 Malignant neoplasm of lower third of esophagus: Secondary | ICD-10-CM

## 2015-07-16 LAB — CBC WITH DIFFERENTIAL/PLATELET
BASOS ABS: 0.1 10*3/uL (ref 0–0.1)
BASOS PCT: 1 %
EOS PCT: 4 %
Eosinophils Absolute: 0.2 10*3/uL (ref 0–0.7)
HCT: 41.8 % (ref 40.0–52.0)
Hemoglobin: 14.1 g/dL (ref 13.0–18.0)
Lymphocytes Relative: 20 %
Lymphs Abs: 1.2 10*3/uL (ref 1.0–3.6)
MCH: 30.8 pg (ref 26.0–34.0)
MCHC: 33.7 g/dL (ref 32.0–36.0)
MCV: 91.6 fL (ref 80.0–100.0)
MONO ABS: 0.6 10*3/uL (ref 0.2–1.0)
Monocytes Relative: 9 %
Neutro Abs: 4.1 10*3/uL (ref 1.4–6.5)
Neutrophils Relative %: 66 %
PLATELETS: 107 10*3/uL — AB (ref 150–440)
RBC: 4.56 MIL/uL (ref 4.40–5.90)
RDW: 15.2 % — AB (ref 11.5–14.5)
WBC: 6.2 10*3/uL (ref 3.8–10.6)

## 2015-07-16 LAB — COMPREHENSIVE METABOLIC PANEL
ALBUMIN: 3.8 g/dL (ref 3.5–5.0)
ALK PHOS: 74 U/L (ref 38–126)
ALT: 13 U/L — ABNORMAL LOW (ref 17–63)
ANION GAP: 6 (ref 5–15)
AST: 22 U/L (ref 15–41)
BILIRUBIN TOTAL: 1 mg/dL (ref 0.3–1.2)
BUN: 14 mg/dL (ref 6–20)
CALCIUM: 8.9 mg/dL (ref 8.9–10.3)
CO2: 29 mmol/L (ref 22–32)
Chloride: 102 mmol/L (ref 101–111)
Creatinine, Ser: 1.08 mg/dL (ref 0.61–1.24)
GFR calc Af Amer: >60 mL/min (ref >60)
GFR calc non Af Amer: >60 mL/min (ref >60)
GLUCOSE: 114 mg/dL — AB (ref 65–99)
Potassium: 4.1 mmol/L (ref 3.5–5.1)
Sodium: 137 mmol/L (ref 135–145)
TOTAL PROTEIN: 6.7 g/dL (ref 6.5–8.1)

## 2015-07-16 MED ORDER — HEPARIN SOD (PORK) LOCK FLUSH 100 UNIT/ML IV SOLN
500.0000 [IU] | Freq: Once | INTRAVENOUS | Status: AC
  Administered 2015-07-16: 500 [IU] via INTRAVENOUS

## 2015-07-16 MED ORDER — SODIUM CHLORIDE 0.9 % IV SOLN
Freq: Once | INTRAVENOUS | Status: AC
  Administered 2015-07-16: 12:00:00 via INTRAVENOUS
  Filled 2015-07-16: qty 1000

## 2015-07-16 MED ORDER — SODIUM CHLORIDE 0.9% FLUSH
10.0000 mL | Freq: Once | INTRAVENOUS | Status: AC
  Administered 2015-07-16: 10 mL via INTRAVENOUS
  Filled 2015-07-16: qty 10

## 2015-07-16 NOTE — Progress Notes (Signed)
Patient's epigastric pain has returned and is having difficulty swallowing.  He thought he was suppose to go for a test where "they put the light down me" today but when he went to [facility] entrance they did not have anything scheduled.  When I look in his chart I do not see any tests that were ordered or scheduled.  Patient's blood pressure is low at 88/57 today and does report feeling weak.

## 2015-07-25 NOTE — Progress Notes (Signed)
[place]  [phone] [phone]) [phone]  ID: Jeremy Gordon OB: 04/07/1941  MR#: [id number]  KPT#:[id number]  Patient Care Team: [name], MD as PCP - General (Geriatric Medicine) No Pcp Per Patient (General Practice) Manya Silvas, MD (Gastroenterology)  CHIEF COMPLAINT:  Chief Complaint  Patient presents with  . Esophageal Cancer    INTERVAL HISTORY: Patient returns to clinic today for laboratory work and further evaluation. His dysphagia and epigastric pain has slowly gotten worse over the past several weeks. Patient was supposed to have endoscopy at [facility], but showed up at [facility] instead therefore procedure never was completed. He has increased weakness and fatigue. He has no neurologic complaints. He denies any recent fevers. He denies any shortness of breath or cough. He denies any nausea, vomiting, constipation, or diarrhea. He has no melena or hematochezia. He has no urinary complaints. Patient offers no  further specific complaints today.  REVIEW OF SYSTEMS:   Review of Systems  Constitutional: Negative for fever, weight loss and malaise/fatigue.  HENT:       Dysphasia.  Respiratory: Negative.  Negative for shortness of breath.   Cardiovascular: Positive for chest pain.  Gastrointestinal: Positive for nausea. Negative for blood in stool and melena.  Musculoskeletal: Negative.   Neurological: Negative.  Negative for weakness.  Endo/Heme/Allergies: Does not bruise/bleed easily.    As per HPI. Otherwise, a complete review of systems is negatve.  PAST MEDICAL HISTORY:  Hypertension, sleep apnea, atrial fibrillation.  PAST SURGICAL HISTORY: Negative.  FAMILY HISTORY: Reviewed and unchanged. No report of malignancy or chronic disease.     ADVANCED DIRECTIVES:    HEALTH MAINTENANCE: Social History  Substance Use Topics  . Smoking status: Current Some Day Smoker -- 62 years  . Smokeless tobacco: Current User    Types: Snuff    . Alcohol Use: No     Colonoscopy:  PAP:  Bone density:  Lipid panel:  No Known Allergies  Current Outpatient Prescriptions  Medication Sig Dispense Refill  . albuterol (PROVENTIL HFA;VENTOLIN HFA) 108 (90 BASE) MCG/ACT inhaler Inhale 2 puffs into the lungs every 4 (four) hours as needed for wheezing or shortness of breath.    . digoxin (LANOXIN) 0.125 MG tablet Take 1 tablet (0.125 mg total) by mouth daily. 30 tablet 0  . diltiazem (CARDIZEM CD) 360 MG 24 hr capsule Take 1 capsule (360 mg total) by mouth daily. 30 capsule 0  . enalapril (VASOTEC) 20 MG tablet Take 20 mg by mouth 2 (two) times daily.    . finasteride (PROSCAR) 5 MG tablet Take 5 mg by mouth daily.    Marland Kitchen gabapentin (NEURONTIN) 300 MG capsule Take 300 mg by mouth 2 (two) times daily.    Marland Kitchen HYDROcodone-acetaminophen (NORCO) 10-325 MG tablet Take 1 tablet by mouth every 6 (six) hours as needed for severe pain. 20 tablet 0  . magic mouthwash SOLN Take 5-10 mLs by mouth 4 (four) times daily. 480 mL 2  . omeprazole (PRILOSEC) 20 MG capsule Take 20 mg by mouth 2 (two) times daily.    . sucralfate (CARAFATE) 1 GM/10ML suspension Take 10 mLs (1 g total) by mouth 2 (two) times daily. 420 mL 2  . warfarin (COUMADIN) 4 MG tablet Take 2 tablets (8 mg total) by mouth every evening. 60 tablet 0  . diphenoxylate-atropine (LOMOTIL) 2.5-0.025 MG tablet Take 1 tablet by mouth 4 (four) times daily as needed for diarrhea or loose stools. (Patient not taking: Reported on 07/16/2015) 30  tablet 0   No current facility-administered medications for this visit.   Facility-Administered Medications Ordered in Other Visits  Medication Dose Route Frequency Provider Last Rate Last Dose  . 0.9 %  sodium chloride infusion   Intravenous Once Lloyd Huger, MD      . sodium chloride 0.9 % injection 10 mL  10 mL Intravenous PRN Lloyd Huger, MD   10 mL at 12/23/14 0945  . sodium chloride 0.9 % injection 10 mL  10 mL Intracatheter PRN Lloyd Huger, MD   10 mL at 12/30/14 0947  . sodium chloride 0.9 % injection 10 mL  10 mL Intravenous PRN Lloyd Huger, MD   10 mL at 01/15/15 1512    OBJECTIVE: Filed Vitals:   07/16/15 1103  BP: 88/57  Pulse: 65  Temp: 97.4 F (36.3 C)  Resp: 18     Body mass index is 37.18 kg/(m^2).    ECOG FS:1 - Symptomatic but completely ambulatory  General: Well-developed, well-nourished, no acute distress. Eyes: anicteric sclera. Lungs: Clear to auscultation bilaterally. Heart: Regular rate and rhythm. No rubs, murmurs, or gallops. Abdomen: Soft, nontender, nondistended. No organomegaly noted, normoactive bowel sounds. Musculoskeletal: No edema, cyanosis, or clubbing. Neuro: Alert, answering all questions appropriately. Cranial nerves grossly intact. Skin: No rashes or petechiae noted. Psych: Normal affect.   LAB RESULTS:  Lab Results  Component Value Date   NA 137 07/16/2015   K 4.1 07/16/2015   CL 102 07/16/2015   CO2 29 07/16/2015   GLUCOSE 114* 07/16/2015   BUN 14 07/16/2015   CREATININE 1.08 07/16/2015   CALCIUM 8.9 07/16/2015   PROT 6.7 07/16/2015   ALBUMIN 3.8 07/16/2015   AST 22 07/16/2015   ALT 13* 07/16/2015   ALKPHOS 74 07/16/2015   BILITOT 1.0 07/16/2015   GFRNONAA >60 07/16/2015   GFRAA >60 07/16/2015    Lab Results  Component Value Date   WBC 6.2 07/16/2015   NEUTROABS 4.1 07/16/2015   HGB 14.1 07/16/2015   HCT 41.8 07/16/2015   MCV 91.6 07/16/2015   PLT 107* 07/16/2015     STUDIES: No results found.  ASSESSMENT: Recurrent adenocarcinoma of the esophagus, HER-2 negative.  PLAN:    1. Esophageal cancer: CT results from May 29, 2015 reviewed independently and reported with stable esophageal thickening. Patient's last chemotherapy was on June 02, 2015.  Patient had an EGD scheduled, but missed his appointment. Because of his worsening dysphagia we will reschedule his EGD with local GI physician. Patient will return to clinic in 1 month  after his EGD discuss the results and treatment planning if necessary.  2. Atrial fibrillation: Continue Coumadin as prescribed. INRs are monitored by his primary care physician. 3. Thrombocytopenia: Patient's platelet count is decreased but essentially stable. Monitor 4. Difficulty swallowing: EGD and imaging as abovetube placement in the past.   Patient expressed understanding and was in agreement with this plan. He also understands that He can call clinic at any time with any questions, concerns, or complaints.    Lloyd Huger, MD   07/25/2015 7:19 AM

## 2015-08-04 ENCOUNTER — Telehealth: Payer: Self-pay | Admitting: *Deleted

## 2015-08-04 DIAGNOSIS — C159 Malignant neoplasm of esophagus, unspecified: Secondary | ICD-10-CM

## 2015-08-04 DIAGNOSIS — R109 Unspecified abdominal pain: Secondary | ICD-10-CM

## 2015-08-04 NOTE — Telephone Encounter (Signed)
Will fax referral, office notes to [facility].

## 2015-08-04 NOTE — Telephone Encounter (Signed)
Referral has been made to Dr Vira Agar, but they did not get it per their receptionist. Tillie Rung made aware Per Dr Grayland Ormond, have pt come in for lab work CBC, CMP, Lipase. Pt has agreed to go to [facility] for lab tomorrow at 1045

## 2015-08-04 NOTE — Telephone Encounter (Signed)
Complains of severe abd pain. He refers to "supposed to have a light run down my throat to see what is going on, but I haven't heard anything about it".  I do not see that this is scheduled. He is asking for something for his abd pain.

## 2015-08-05 ENCOUNTER — Inpatient Hospital Stay: Payer: Medicare Other | Attending: Oncology

## 2015-08-05 ENCOUNTER — Other Ambulatory Visit: Payer: Self-pay | Admitting: *Deleted

## 2015-08-05 ENCOUNTER — Telehealth: Payer: Self-pay | Admitting: Oncology

## 2015-08-05 DIAGNOSIS — R109 Unspecified abdominal pain: Secondary | ICD-10-CM

## 2015-08-05 DIAGNOSIS — C159 Malignant neoplasm of esophagus, unspecified: Secondary | ICD-10-CM

## 2015-08-05 LAB — CBC WITH DIFFERENTIAL/PLATELET
BASOS PCT: 1 %
Basophils Absolute: 0.1 10*3/uL (ref 0–0.1)
Eosinophils Absolute: 0.1 10*3/uL (ref 0–0.7)
Eosinophils Relative: 2 %
HEMATOCRIT: 43.1 % (ref 40.0–52.0)
HEMOGLOBIN: 14.1 g/dL (ref 13.0–18.0)
LYMPHS ABS: 1.1 10*3/uL (ref 1.0–3.6)
LYMPHS PCT: 19 %
MCH: 29.3 pg (ref 26.0–34.0)
MCHC: 32.6 g/dL (ref 32.0–36.0)
MCV: 89.6 fL (ref 80.0–100.0)
MONO ABS: 0.6 10*3/uL (ref 0.2–1.0)
MONOS PCT: 10 %
NEUTROS ABS: 4 10*3/uL (ref 1.4–6.5)
NEUTROS PCT: 68 %
Platelets: 118 10*3/uL — ABNORMAL LOW (ref 150–440)
RBC: 4.81 MIL/uL (ref 4.40–5.90)
RDW: 14.7 % — AB (ref 11.5–14.5)
WBC: 5.8 10*3/uL (ref 3.8–10.6)

## 2015-08-05 LAB — COMPREHENSIVE METABOLIC PANEL
ALBUMIN: 4 g/dL (ref 3.5–5.0)
ALK PHOS: 85 U/L (ref 38–126)
ALT: 16 U/L — ABNORMAL LOW (ref 17–63)
ANION GAP: 3 — AB (ref 5–15)
AST: 25 U/L (ref 15–41)
BILIRUBIN TOTAL: 1.1 mg/dL (ref 0.3–1.2)
BUN: 14 mg/dL (ref 6–20)
CALCIUM: 8.8 mg/dL — AB (ref 8.9–10.3)
CO2: 30 mmol/L (ref 22–32)
Chloride: 105 mmol/L (ref 101–111)
Creatinine, Ser: 1.03 mg/dL (ref 0.61–1.24)
GFR calc Af Amer: >60 mL/min (ref >60)
GLUCOSE: 113 mg/dL — AB (ref 65–99)
POTASSIUM: 3.8 mmol/L (ref 3.5–5.1)
Sodium: 138 mmol/L (ref 135–145)
TOTAL PROTEIN: 6.7 g/dL (ref 6.5–8.1)

## 2015-08-05 LAB — LIPASE, BLOOD: LIPASE: 20 U/L (ref 11–51)

## 2015-08-05 NOTE — Telephone Encounter (Signed)
Patient came in for labwork added on by triage nurse. He wants to know if he is supposed to get IVF today as well. Please advise, patient is in [facility] waiting room, waiting on answer. Thanks.

## 2015-08-05 NOTE — Telephone Encounter (Signed)
Please add infusion encounter for patient to receive 1 hour of IVF today in [place]. Sharyn Lull is aware, orders entered.

## 2015-08-05 NOTE — Telephone Encounter (Signed)
He was not scheduled for IVF nor was it even discussed. He was asking about a scope when he called me. See phone note form 4/18

## 2015-08-13 ENCOUNTER — Encounter: Payer: Self-pay | Admitting: *Deleted

## 2015-08-13 ENCOUNTER — Inpatient Hospital Stay: Payer: Medicare Other | Admitting: Oncology

## 2015-08-13 ENCOUNTER — Ambulatory Visit
Admission: EM | Admit: 2015-08-13 | Discharge: 2015-08-13 | Disposition: A | Discharge status: Home / Self Care | Payer: Medicare Other | Attending: Family Medicine | Admitting: Family Medicine

## 2015-08-13 ENCOUNTER — Inpatient Hospital Stay: Payer: Medicare Other

## 2015-08-13 DIAGNOSIS — J209 Acute bronchitis, unspecified: Secondary | ICD-10-CM | POA: Diagnosis not present

## 2015-08-13 MED ORDER — CEFUROXIME AXETIL 500 MG PO TABS: 500.0000 mg | ORAL_TABLET | Freq: Two times a day (BID) | ORAL | Status: DC

## 2015-08-13 MED ORDER — HYDROCOD POLST-CPM POLST ER 10-8 MG/5ML PO SUER: 5.0000 mL | Freq: Two times a day (BID) | ORAL | Status: DC | PRN

## 2015-08-13 NOTE — Discharge Instructions (Signed)

## 2015-08-13 NOTE — ED Notes (Signed)
Patient started having symptoms of nasal congestion, cough, and chest congestion for one week.

## 2015-08-13 NOTE — ED Provider Notes (Signed)
CSN: [id number]     Arrival date & time 08/13/15  1213 History   First MD Initiated Contact with Patient 08/13/15 1258    Nurses notes were reviewed. Chief Complaint  Patient presents with  . Nasal Congestion  . Cough  Patient is here because of cough persistent cough that is been going on for over a week. He has multiple medical problems please a very poor historian. Asked if he has been diagnosed as COPD he denies it however his records has that he's had a diagnosis of COPD. He only smoked about once a month but he still states that his wife smokes heavily. He has history of persistent and recurrent atrial fib. He's a history of esophageal carcinoma thyroidectomy as he has a cancer somewhere in his chest. In his family says history of cancer in his mother and his sister and brother both have hypertension and his father had a stroke. He denies having a fever doesn't state the cough is nonproductive but he has had this chest congestion and got worse yesterday after being outside working on his lawnmower initiated.    Surgery getting a chest x-ray but with a history of chemotherapy and other radiation recently and no fever will hold off on that. (Consider location/radiation/quality/duration/timing/severity/associated sxs/prior Treatment) Patient is a 75 y.o. male presenting with cough. The history is provided by the patient. The history is limited by a developmental delay and the condition of the patient. No language interpreter was used.  Cough Cough characteristics:  Productive and non-productive Severity:  Moderate Onset quality:  Gradual Duration:  1 week Timing:  Constant Progression:  Worsening Context: sick contacts, smoke exposure and upper respiratory infection   Relieved by:  Nothing Exacerbated by: Over-the-counter cough medicine has not helped with cough and congestion. Ineffective treatments:  Decongestant Associated symptoms: rhinorrhea   Associated symptoms: no chest pain and  no chills   Risk factors: no recent infection     Past Medical History  Diagnosis Date  . Sleep apnea   . Persistent atrial fibrillation ([place])     a. 07/2013 Echo: EF 60-65%, mod dil LA, mild TR;  CHA2DS2VASc = 3-->chronic coumadin.  . Essential hypertension   . History of stress test     a. 06/2003 MV: EF 56%, no ischemia/infarct.  Marland Kitchen COPD (chronic obstructive pulmonary disease) ([place])   . Esophageal carcinoma ([place])     a. Chemo: FOLFIRI   Past Surgical History  Procedure Laterality Date  . Peripheral vascular catheterization N/A 11/20/2014    Procedure: Glori Luis Cath Insertion;  Surgeon: Algernon Huxley, MD;  Location: [facility];  Service: Cardiovascular;  Laterality: N/A;  . Hernia repair     Family History  Problem Relation Age of Onset  . Hypertension Sister   . Hypertension Brother   . Cancer Mother   . Stroke Father    Social History  Substance Use Topics  . Smoking status: Current Some Day Smoker -- 62 years  . Smokeless tobacco: Current User    Types: Snuff  . Alcohol Use: No    Review of Systems  Unable to perform ROS: Other  Constitutional: Negative for chills.  HENT: Positive for rhinorrhea.   Respiratory: Positive for cough.   Cardiovascular: Negative for chest pain.    Allergies  Review of patient's allergies indicates no known allergies.  Home Medications   Prior to Admission medications   Medication Sig Start Date End Date Taking? Authorizing Provider  albuterol (PROVENTIL HFA;VENTOLIN HFA) 108 (  90 BASE) MCG/ACT inhaler Inhale 2 puffs into the lungs every 4 (four) hours as needed for wheezing or shortness of breath.    Historical Provider, MD  cefUROXime (CEFTIN) 500 MG tablet Take 1 tablet (500 mg total) by mouth 2 (two) times daily. 08/13/15   Frederich Cha, MD  chlorpheniramine-HYDROcodone [facility] PENNKINETIC ER) 10-8 MG/5ML SUER Take 5 mLs by mouth every 12 (twelve) hours as needed for cough. 08/13/15   Frederich Cha, MD  digoxin (LANOXIN) 0.125  MG tablet Take 1 tablet (0.125 mg total) by mouth daily. 02/02/15   Vaughan Basta, MD  diltiazem (CARDIZEM CD) 360 MG 24 hr capsule Take 1 capsule (360 mg total) by mouth daily. 02/02/15   Vaughan Basta, MD  diphenoxylate-atropine (LOMOTIL) 2.5-0.025 MG tablet Take 1 tablet by mouth 4 (four) times daily as needed for diarrhea or loose stools. Patient not taking: Reported on 07/16/2015 06/10/15   Lloyd Huger, MD  enalapril (VASOTEC) 20 MG tablet Take 20 mg by mouth 2 (two) times daily.    Historical Provider, MD  finasteride (PROSCAR) 5 MG tablet Take 5 mg by mouth daily.    Historical Provider, MD  gabapentin (NEURONTIN) 300 MG capsule Take 300 mg by mouth 2 (two) times daily.    Historical Provider, MD  HYDROcodone-acetaminophen (NORCO) 10-325 MG tablet Take 1 tablet by mouth every 6 (six) hours as needed for severe pain. 02/02/15   Vaughan Basta, MD  magic mouthwash SOLN Take 5-10 mLs by mouth 4 (four) times daily. 06/02/15   Lloyd Huger, MD  omeprazole (PRILOSEC) 20 MG capsule Take 20 mg by mouth 2 (two) times daily.    Historical Provider, MD  sucralfate (CARAFATE) 1 GM/10ML suspension Take 10 mLs (1 g total) by mouth 2 (two) times daily. 06/02/15   Lloyd Huger, MD  warfarin (COUMADIN) 4 MG tablet Take 2 tablets (8 mg total) by mouth every evening. 02/02/15   Vaughan Basta, MD   Meds Ordered and Administered this Visit  Medications - No data to display  BP 126/74 mmHg  Pulse 50  Temp(Src) 98 F (36.7 C) (Oral)  Resp 20  Ht 5\' 6"  (1.676 m)  Wt 231 lb (104.781 kg)  BMI 37.30 kg/m2  SpO2 97% No data found.   Physical Exam  Constitutional: He appears well-nourished.  HENT:  Head: Normocephalic and atraumatic.  Right Ear: External ear normal.  Left Ear: External ear normal.  Eyes: Conjunctivae are normal. Pupils are equal, round, and reactive to light.  Neck: Normal range of motion. Neck supple.  Cardiovascular: Normal rate.  Exam  reveals distant heart sounds.   Pulmonary/Chest: He has decreased breath sounds.  Musculoskeletal: Normal range of motion.  Lymphadenopathy:    He has cervical adenopathy.  Neurological: He is alert.  Psychiatric: He has a normal mood and affect.  Vitals reviewed.   ED Course  Procedures (including critical care time)  Labs Review Labs Reviewed - No data to display  Imaging Review No results found.   Visual Acuity Review  Right Eye Distance:   Left Eye Distance:   Bilateral Distance:    Right Eye Near:   Left Eye Near:    Bilateral Near:         MDM   1. Acute bronchitis, unspecified organism    We'll place on Tussionex 1/2-1 teaspoon twice a day and also placed on Ceftin 500 mg 1 tablet twice a day follow-up PCP if not better in next week or two.  Note: This dictation was prepared with Dragon dictation along with smaller phrase technology. Any transcriptional errors that result from this process are unintentional.    Frederich Cha, MD 08/13/15 1326

## 2015-08-19 ENCOUNTER — Inpatient Hospital Stay (HOSPITAL_BASED_OUTPATIENT_CLINIC_OR_DEPARTMENT_OTHER): Payer: Medicare Other | Admitting: Oncology

## 2015-08-19 ENCOUNTER — Inpatient Hospital Stay: Payer: Medicare Other | Attending: Oncology

## 2015-08-19 VITALS — BP 130/72 | HR 64 | Temp 97.1°F | Resp 20 | Wt 231.0 lb

## 2015-08-19 DIAGNOSIS — Z79899 Other long term (current) drug therapy: Secondary | ICD-10-CM

## 2015-08-19 DIAGNOSIS — R112 Nausea with vomiting, unspecified: Secondary | ICD-10-CM | POA: Diagnosis not present

## 2015-08-19 DIAGNOSIS — F17210 Nicotine dependence, cigarettes, uncomplicated: Secondary | ICD-10-CM | POA: Insufficient documentation

## 2015-08-19 DIAGNOSIS — I10 Essential (primary) hypertension: Secondary | ICD-10-CM | POA: Diagnosis not present

## 2015-08-19 DIAGNOSIS — R4702 Dysphasia: Secondary | ICD-10-CM

## 2015-08-19 DIAGNOSIS — G4730 Sleep apnea, unspecified: Secondary | ICD-10-CM | POA: Diagnosis not present

## 2015-08-19 DIAGNOSIS — I4891 Unspecified atrial fibrillation: Secondary | ICD-10-CM | POA: Diagnosis not present

## 2015-08-19 DIAGNOSIS — Z7901 Long term (current) use of anticoagulants: Secondary | ICD-10-CM | POA: Diagnosis not present

## 2015-08-19 DIAGNOSIS — R630 Anorexia: Secondary | ICD-10-CM

## 2015-08-19 DIAGNOSIS — R109 Unspecified abdominal pain: Secondary | ICD-10-CM | POA: Insufficient documentation

## 2015-08-19 DIAGNOSIS — C159 Malignant neoplasm of esophagus, unspecified: Secondary | ICD-10-CM | POA: Diagnosis not present

## 2015-08-19 DIAGNOSIS — C155 Malignant neoplasm of lower third of esophagus: Secondary | ICD-10-CM

## 2015-08-19 LAB — COMPREHENSIVE METABOLIC PANEL
ALBUMIN: 3.8 g/dL (ref 3.5–5.0)
ALK PHOS: 87 U/L (ref 38–126)
ALT: 15 U/L — ABNORMAL LOW (ref 17–63)
ANION GAP: 7 (ref 5–15)
AST: 25 U/L (ref 15–41)
BUN: 16 mg/dL (ref 6–20)
CALCIUM: 9.1 mg/dL (ref 8.9–10.3)
CO2: 28 mmol/L (ref 22–32)
Chloride: 100 mmol/L — ABNORMAL LOW (ref 101–111)
Creatinine, Ser: 1.05 mg/dL (ref 0.61–1.24)
GFR calc Af Amer: >60 mL/min (ref >60)
GFR calc non Af Amer: >60 mL/min (ref >60)
GLUCOSE: 162 mg/dL — AB (ref 65–99)
POTASSIUM: 3.9 mmol/L (ref 3.5–5.1)
SODIUM: 135 mmol/L (ref 135–145)
Total Bilirubin: 0.6 mg/dL (ref 0.3–1.2)
Total Protein: 6.8 g/dL (ref 6.5–8.1)

## 2015-08-19 LAB — CBC WITH DIFFERENTIAL/PLATELET
Basophils Absolute: 0 10*3/uL (ref 0–0.1)
Basophils Relative: 1 %
Eosinophils Absolute: 0.1 10*3/uL (ref 0–0.7)
Eosinophils Relative: 2 %
HEMATOCRIT: 44.3 % (ref 40.0–52.0)
Hemoglobin: 14.9 g/dL (ref 13.0–18.0)
LYMPHS PCT: 29 %
Lymphs Abs: 1.6 10*3/uL (ref 1.0–3.6)
MCH: 29 pg (ref 26.0–34.0)
MCHC: 33.6 g/dL (ref 32.0–36.0)
MCV: 86.4 fL (ref 80.0–100.0)
MONO ABS: 0.4 10*3/uL (ref 0.2–1.0)
MONOS PCT: 8 %
NEUTROS ABS: 3.2 10*3/uL (ref 1.4–6.5)
Neutrophils Relative %: 60 %
Platelets: 154 10*3/uL (ref 150–440)
RBC: 5.12 MIL/uL (ref 4.40–5.90)
RDW: 14.4 % (ref 11.5–14.5)
WBC: 5.4 10*3/uL (ref 3.8–10.6)

## 2015-08-19 NOTE — Progress Notes (Signed)
Patient is having difficulty swallowing and when he eats he will vomit.  While in the office today he has eaten 2 crackers and feels like is going to vomit now.  He is having epigastric/esophageal pain 5/10 that is worse after eating.  Has not had the EGD performed due to him not feeling well (per patient) and has not been rescheduled.

## 2015-08-19 NOTE — Progress Notes (Signed)
[place]  Telephone:(336) [phone] [phone]) [phone]  ID: Jeremy Gordon OB: [birth year]  MR#: [id number]  VQM#:[id number]  Patient Care Team: [name], MD as PCP - General (Geriatric Medicine) No Pcp Per Patient (General Practice) Manya Silvas, MD (Gastroenterology)  CHIEF COMPLAINT:  Chief Complaint  Patient presents with  . Esophageal Cancer    INTERVAL HISTORY: Patient returns to clinic today for laboratory work and endoscopy results. He did not have his EGD because he had a cold. He is feeling a little better and his cough is improved but he does have abdomen pain and he is having some trouble keeping food down. He does not have any other complaints today. He has no neurologic complaints. He denies any recent fevers. He denies any shortness of breath or chest pain. He denies any nausea, constipation, or diarrhea. He has no melena or hematochezia. He has no urinary complaints.   REVIEW OF SYSTEMS:   Review of Systems  Constitutional: Negative for fever, weight loss and malaise/fatigue.  HENT:       Dysphasia.  Respiratory: Negative.  Negative for shortness of breath.   Cardiovascular: Negative for chest pain.  Gastrointestinal: Positive for abdominal pain. Negative for nausea, blood in stool and melena.  Musculoskeletal: Negative.   Neurological: Negative.  Negative for weakness.  Endo/Heme/Allergies: Does not bruise/bleed easily.    As per HPI. Otherwise, a complete review of systems is negatve.  PAST MEDICAL HISTORY:  Hypertension, sleep apnea, atrial fibrillation.  PAST SURGICAL HISTORY: Negative.  FAMILY HISTORY: Reviewed and unchanged. No report of malignancy or chronic disease.     ADVANCED DIRECTIVES:    HEALTH MAINTENANCE: Social History  Substance Use Topics  . Smoking status: Current Some Day Smoker -- 62 years  . Smokeless tobacco: Current User    Types: Snuff  . Alcohol Use: No     No Known Allergies  Current  Outpatient Prescriptions  Medication Sig Dispense Refill  . albuterol (PROVENTIL HFA;VENTOLIN HFA) 108 (90 BASE) MCG/ACT inhaler Inhale 2 puffs into the lungs every 4 (four) hours as needed for wheezing or shortness of breath.    . cefUROXime (CEFTIN) 500 MG tablet Take 1 tablet (500 mg total) by mouth 2 (two) times daily. 20 tablet 0  . chlorpheniramine-HYDROcodone (TUSSIONEX PENNKINETIC ER) 10-8 MG/5ML SUER Take 5 mLs by mouth every 12 (twelve) hours as needed for cough. 115 mL 0  . digoxin (LANOXIN) 0.125 MG tablet Take 1 tablet (0.125 mg total) by mouth daily. 30 tablet 0  . diltiazem (CARDIZEM CD) 360 MG 24 hr capsule Take 1 capsule (360 mg total) by mouth daily. 30 capsule 0  . enalapril (VASOTEC) 20 MG tablet Take 20 mg by mouth 2 (two) times daily.    . finasteride (PROSCAR) 5 MG tablet Take 5 mg by mouth daily.    Marland Kitchen gabapentin (NEURONTIN) 300 MG capsule Take 300 mg by mouth 2 (two) times daily.    Marland Kitchen HYDROcodone-acetaminophen (NORCO) 10-325 MG tablet Take 1 tablet by mouth every 6 (six) hours as needed for severe pain. 20 tablet 0  . magic mouthwash SOLN Take 5-10 mLs by mouth 4 (four) times daily. 480 mL 2  . omeprazole (PRILOSEC) 20 MG capsule Take 20 mg by mouth 2 (two) times daily.    . sucralfate (CARAFATE) 1 GM/10ML suspension Take 10 mLs (1 g total) by mouth 2 (two) times daily. 420 mL 2  . warfarin (COUMADIN) 4 MG tablet Take 2 tablets (8 mg total)  by mouth every evening. 60 tablet 0  . diphenoxylate-atropine (LOMOTIL) 2.5-0.025 MG tablet Take 1 tablet by mouth 4 (four) times daily as needed for diarrhea or loose stools. (Patient not taking: Reported on 07/16/2015) 30 tablet 0   No current facility-administered medications for this visit.   Facility-Administered Medications Ordered in Other Visits  Medication Dose Route Frequency Provider Last Rate Last Dose  . 0.9 %  sodium chloride infusion   Intravenous Once Lloyd Huger, MD      . sodium chloride 0.9 % injection 10 mL   10 mL Intravenous PRN Lloyd Huger, MD   10 mL at 12/23/14 0945  . sodium chloride 0.9 % injection 10 mL  10 mL Intracatheter PRN Lloyd Huger, MD   10 mL at 12/30/14 0947  . sodium chloride 0.9 % injection 10 mL  10 mL Intravenous PRN Lloyd Huger, MD   10 mL at 01/15/15 1512    OBJECTIVE: Filed Vitals:   08/19/15 1057  BP: 130/72  Pulse: 64  Temp: 97.1 F (36.2 C)  Resp: 20     Body mass index is 37.3 kg/(m^2).    ECOG FS:1 - Symptomatic but completely ambulatory  General: Well-developed, well-nourished, no acute distress. Eyes: anicteric sclera. Lungs: Clear to auscultation bilaterally. Heart: Regular rate and rhythm. No rubs, murmurs, or gallops. Abdomen: Soft, nontender, nondistended. No organomegaly noted, normoactive bowel sounds. Musculoskeletal: No edema, cyanosis, or clubbing. Neuro: Alert, answering all questions appropriately. Cranial nerves grossly intact. Skin: No rashes or petechiae noted. Psych: Normal affect.   LAB RESULTS:  Lab Results  Component Value Date   NA 135 08/19/2015   K 3.9 08/19/2015   CL 100* 08/19/2015   CO2 28 08/19/2015   GLUCOSE 162* 08/19/2015   BUN 16 08/19/2015   CREATININE 1.05 08/19/2015   CALCIUM 9.1 08/19/2015   PROT 6.8 08/19/2015   ALBUMIN 3.8 08/19/2015   AST 25 08/19/2015   ALT 15* 08/19/2015   ALKPHOS 87 08/19/2015   BILITOT 0.6 08/19/2015   GFRNONAA >60 08/19/2015   GFRAA >60 08/19/2015    Lab Results  Component Value Date   WBC 5.4 08/19/2015   NEUTROABS 3.2 08/19/2015   HGB 14.9 08/19/2015   HCT 44.3 08/19/2015   MCV 86.4 08/19/2015   PLT 154 08/19/2015     STUDIES: No results found.  ASSESSMENT: Recurrent adenocarcinoma of the esophagus, HER-2 negative.  PLAN:    1. Esophageal cancer:  Patient's last chemotherapy was on June 02, 2015.  Patient had an EGD scheduled twice, but missed his first appointment and then had a cold so the second one was canceled. His labs are WNL today.  Patient will return to clinic after his EGD discuss the results and treatment planning if necessary.  2. Poor appetite: Patient does not require IV fluids today.   Patient expressed understanding and was in agreement with this plan. He also understands that He can call clinic at any time with any questions, concerns, or complaints.    Mayra Reel, NP   08/19/2015 11:21 AM  Patient was seen and evaluated independently and I agree with the assessment and plan as dictated above.  Lloyd Huger, MD 08/20/2015 4:54 PM

## 2015-08-24 ENCOUNTER — Telehealth: Payer: Self-pay | Admitting: Oncology

## 2015-08-24 ENCOUNTER — Inpatient Hospital Stay: Payer: Medicare Other

## 2015-08-24 ENCOUNTER — Telehealth: Payer: Self-pay | Admitting: *Deleted

## 2015-08-24 ENCOUNTER — Other Ambulatory Visit: Payer: Self-pay | Admitting: *Deleted

## 2015-08-24 DIAGNOSIS — J690 Pneumonitis due to inhalation of food and vomit: Secondary | ICD-10-CM | POA: Insufficient documentation

## 2015-08-24 DIAGNOSIS — C159 Malignant neoplasm of esophagus, unspecified: Secondary | ICD-10-CM

## 2015-08-24 NOTE — Telephone Encounter (Signed)
Patient's sister left a voicemail saying he has been feeling pretty sick over the past few days and would like to come in and see the doctor. Woodfin Ganja is out this week in [place]. Can I add him on one day in [place]? Please advise. Thanks.

## 2015-08-24 NOTE — Telephone Encounter (Signed)
Called asking to be seen, having difficulty with his esophagus and "just doesn't feel good" Please advise

## 2015-08-24 NOTE — Telephone Encounter (Signed)
Can have IVF, need to see, just saw on Wed and he needs to see GI. Appt for 2 PM given and agreed on.

## 2015-08-24 NOTE — Telephone Encounter (Signed)
I don't know what time you got that message, but he had an appt given to him this morning for IVF for this and he did not show up for it. He does have a GI procedure Kendra arranged for him

## 2015-08-24 NOTE — Telephone Encounter (Signed)
Call placed to patients sister to let her know patient has appointment with Dr. Vira Agar today at 1:30, she states the family will be sure he gets to his appointment. Patient is not coming in today for IVF, will call tomorrow if patient needs to come in for IVF.

## 2015-08-27 ENCOUNTER — Encounter: Payer: Self-pay | Admitting: *Deleted

## 2015-08-28 ENCOUNTER — Encounter
Admission: RE | Disposition: A | Discharge status: Home / Self Care | Payer: Self-pay | Source: Ambulatory Visit | Attending: Unknown Physician Specialty

## 2015-08-28 ENCOUNTER — Ambulatory Visit
Admission: RE | Admit: 2015-08-28 | Discharge: 2015-08-28 | Disposition: A | Discharge status: Home / Self Care | Payer: Medicare Other | Source: Ambulatory Visit | Attending: Unknown Physician Specialty | Admitting: Unknown Physician Specialty

## 2015-08-28 HISTORY — DX: Cardiac arrhythmia, unspecified: I49.9

## 2015-08-28 HISTORY — DX: Unspecified osteoarthritis, unspecified site: M19.90

## 2015-08-28 SURGERY — ESOPHAGOGASTRODUODENOSCOPY (EGD) WITH PROPOFOL
Anesthesia: General

## 2015-08-31 ENCOUNTER — Ambulatory Visit: Payer: Medicare Other | Admitting: Certified Registered Nurse Anesthetist

## 2015-08-31 ENCOUNTER — Encounter
Admission: RE | Disposition: A | Discharge status: Home / Self Care | Payer: Self-pay | Source: Ambulatory Visit | Attending: Unknown Physician Specialty

## 2015-08-31 ENCOUNTER — Ambulatory Visit
Admission: RE | Admit: 2015-08-31 | Discharge: 2015-08-31 | Disposition: A | Discharge status: Home / Self Care | Payer: Medicare Other | Source: Ambulatory Visit | Attending: Unknown Physician Specialty | Admitting: Unknown Physician Specialty

## 2015-08-31 DIAGNOSIS — K2970 Gastritis, unspecified, without bleeding: Secondary | ICD-10-CM | POA: Insufficient documentation

## 2015-08-31 DIAGNOSIS — M4806 Spinal stenosis, lumbar region: Secondary | ICD-10-CM | POA: Insufficient documentation

## 2015-08-31 DIAGNOSIS — J449 Chronic obstructive pulmonary disease, unspecified: Secondary | ICD-10-CM | POA: Diagnosis not present

## 2015-08-31 DIAGNOSIS — C155 Malignant neoplasm of lower third of esophagus: Secondary | ICD-10-CM | POA: Diagnosis not present

## 2015-08-31 DIAGNOSIS — Y842 Radiological procedure and radiotherapy as the cause of abnormal reaction of the patient, or of later complication, without mention of misadventure at the time of the procedure: Secondary | ICD-10-CM | POA: Diagnosis not present

## 2015-08-31 DIAGNOSIS — G4730 Sleep apnea, unspecified: Secondary | ICD-10-CM | POA: Insufficient documentation

## 2015-08-31 DIAGNOSIS — Z923 Personal history of irradiation: Secondary | ICD-10-CM | POA: Insufficient documentation

## 2015-08-31 DIAGNOSIS — K208 Other esophagitis: Secondary | ICD-10-CM | POA: Diagnosis not present

## 2015-08-31 DIAGNOSIS — Z85820 Personal history of malignant melanoma of skin: Secondary | ICD-10-CM | POA: Insufficient documentation

## 2015-08-31 DIAGNOSIS — Z7901 Long term (current) use of anticoagulants: Secondary | ICD-10-CM | POA: Insufficient documentation

## 2015-08-31 DIAGNOSIS — I10 Essential (primary) hypertension: Secondary | ICD-10-CM | POA: Insufficient documentation

## 2015-08-31 DIAGNOSIS — Z79899 Other long term (current) drug therapy: Secondary | ICD-10-CM | POA: Insufficient documentation

## 2015-08-31 DIAGNOSIS — I481 Persistent atrial fibrillation: Secondary | ICD-10-CM | POA: Diagnosis not present

## 2015-08-31 DIAGNOSIS — R1310 Dysphagia, unspecified: Secondary | ICD-10-CM | POA: Diagnosis present

## 2015-08-31 DIAGNOSIS — M1990 Unspecified osteoarthritis, unspecified site: Secondary | ICD-10-CM | POA: Diagnosis not present

## 2015-08-31 DIAGNOSIS — K219 Gastro-esophageal reflux disease without esophagitis: Secondary | ICD-10-CM | POA: Diagnosis not present

## 2015-08-31 HISTORY — PX: ESOPHAGOGASTRODUODENOSCOPY (EGD) WITH PROPOFOL: SHX5813

## 2015-08-31 SURGERY — ESOPHAGOGASTRODUODENOSCOPY (EGD) WITH PROPOFOL
Anesthesia: General

## 2015-08-31 MED ORDER — SODIUM CHLORIDE 0.9 % IV SOLN: INTRAVENOUS | Status: DC

## 2015-08-31 MED ORDER — PROPOFOL 10 MG/ML IV BOLUS
INTRAVENOUS | Status: DC | PRN
  Administered 2015-08-31: 30 mg via INTRAVENOUS

## 2015-08-31 MED ORDER — LIDOCAINE HCL (CARDIAC) 20 MG/ML IV SOLN
INTRAVENOUS | Status: DC | PRN
  Administered 2015-08-31: 30 mg via INTRAVENOUS

## 2015-08-31 MED ORDER — PROPOFOL 500 MG/50ML IV EMUL
INTRAVENOUS | Status: DC | PRN
  Administered 2015-08-31: 120 ug/kg/min via INTRAVENOUS

## 2015-08-31 MED ORDER — SODIUM CHLORIDE 0.9 % IV SOLN
INTRAVENOUS | Status: DC
  Administered 2015-08-31: 1000 mL via INTRAVENOUS

## 2015-08-31 MED ORDER — MIDAZOLAM HCL 2 MG/2ML IJ SOLN
INTRAMUSCULAR | Status: DC | PRN
  Administered 2015-08-31: 1 mg via INTRAVENOUS

## 2015-08-31 NOTE — Transfer of Care (Signed)
Immediate Anesthesia Transfer of Care Note  Patient: Jeremy Gordon  Procedure(s) Performed: Procedure(s): ESOPHAGOGASTRODUODENOSCOPY (EGD) WITH PROPOFOL (N/A)  Patient Location: PACU  Anesthesia Type:General  Level of Consciousness: sedated  Airway & Oxygen Therapy: Patient Spontanous Breathing and Patient connected to nasal cannula oxygen  Post-op Assessment: Report given to RN and Post -op Vital signs reviewed and stable  Post vital signs: Reviewed and stable  Last Vitals:  Filed Vitals:   08/31/15 0912 08/31/15 1041  BP: 138/72 92/57  Pulse: 85 96  Temp: 35.7 C 36 C  Resp: 20 18    Last Pain: There were no vitals filed for this visit.       Complications: No apparent anesthesia complications

## 2015-08-31 NOTE — Anesthesia Procedure Notes (Signed)
Date/Time: 08/31/2015 10:19 AM Performed by: Johnna Acosta Pre-anesthesia Checklist: Patient identified, Emergency Drugs available, Suction available, Patient being monitored and Timeout performed Patient Re-evaluated:Patient Re-evaluated prior to inductionOxygen Delivery Method: Nasal cannula

## 2015-08-31 NOTE — H&P (Signed)
Primary Care Physician:  [name], MD Primary Gastroenterologist:  Dr. Vira Agar  Pre-Procedure History & Physical: HPI:  Jeremy Gordon is a 75 y.o. male is here for an endoscopy.   Past Medical History  Diagnosis Date  . Sleep apnea   . Persistent atrial fibrillation ([place])     a. 07/2013 Echo: EF 60-65%, mod dil LA, mild TR;  CHA2DS2VASc = 3-->chronic coumadin.  . Essential hypertension   . History of stress test     a. 06/2003 MV: EF 56%, no ischemia/infarct.  Marland Kitchen COPD (chronic obstructive pulmonary disease) ([place])   . Esophageal carcinoma ([place])     a. Chemo: FOLFIRI  . Dysrhythmia   . Atrial fibrillation ([place])   . Arthritis     Past Surgical History  Procedure Laterality Date  . Peripheral vascular catheterization N/A 11/20/2014    Procedure: Glori Luis Cath Insertion;  Surgeon: Algernon Huxley, MD;  Location: [place] [facility];  Service: Cardiovascular;  Laterality: N/A;  . Hernia repair      Prior to Admission medications   Medication Sig Start Date End Date Taking? Authorizing Provider  digoxin (LANOXIN) 0.125 MG tablet Take 1 tablet (0.125 mg total) by mouth daily. 02/02/15  Yes Vaughan Basta, MD  warfarin (COUMADIN) 4 MG tablet Take 2 tablets (8 mg total) by mouth every evening. 02/02/15  Yes Vaughan Basta, MD  albuterol (PROVENTIL HFA;VENTOLIN HFA) 108 (90 BASE) MCG/ACT inhaler Inhale 2 puffs into the lungs every 4 (four) hours as needed for wheezing or shortness of breath.    Historical Provider, MD  cefUROXime (CEFTIN) 500 MG tablet Take 1 tablet (500 mg total) by mouth 2 (two) times daily. 08/13/15   Frederich Cha, MD  chlorpheniramine-HYDROcodone [facility] PENNKINETIC ER) 10-8 MG/5ML SUER Take 5 mLs by mouth every 12 (twelve) hours as needed for cough. 08/13/15   Frederich Cha, MD  diltiazem (CARDIZEM CD) 360 MG 24 hr capsule Take 1 capsule (360 mg total) by mouth daily. 02/02/15   Vaughan Basta, MD  diphenoxylate-atropine (LOMOTIL) 2.5-0.025 MG tablet  Take 1 tablet by mouth 4 (four) times daily as needed for diarrhea or loose stools. Patient not taking: Reported on 07/16/2015 06/10/15   Lloyd Huger, MD  enalapril (VASOTEC) 20 MG tablet Take 20 mg by mouth 2 (two) times daily.    Historical Provider, MD  finasteride (PROSCAR) 5 MG tablet Take 5 mg by mouth daily.    Historical Provider, MD  gabapentin (NEURONTIN) 300 MG capsule Take 300 mg by mouth 2 (two) times daily.    Historical Provider, MD  HYDROcodone-acetaminophen (NORCO) 10-325 MG tablet Take 1 tablet by mouth every 6 (six) hours as needed for severe pain. 02/02/15   Vaughan Basta, MD  magic mouthwash SOLN Take 5-10 mLs by mouth 4 (four) times daily. 06/02/15   Lloyd Huger, MD  omeprazole (PRILOSEC) 20 MG capsule Take 20 mg by mouth 2 (two) times daily.    Historical Provider, MD  sucralfate (CARAFATE) 1 GM/10ML suspension Take 10 mLs (1 g total) by mouth 2 (two) times daily. 06/02/15   Lloyd Huger, MD    Allergies as of 08/28/2015  . (No Known Allergies)    Family History  Problem Relation Age of Onset  . Hypertension Sister   . Hypertension Brother   . Cancer Mother   . Stroke Father     Social History   Social History  . Marital Status: Married    Spouse Name: N/A  . Number  of Children: N/A  . Years of Education: N/A   Occupational History  . Not on file.   Social History Main Topics  . Smoking status: Current Some Day Smoker -- 62 years  . Smokeless tobacco: Current User    Types: Snuff  . Alcohol Use: No  . Drug Use: No  . Sexual Activity: Not on file   Other Topics Concern  . Not on file   Social History Narrative    Review of Systems: See HPI, otherwise negative ROS  Physical Exam: BP 138/72 mmHg  Pulse 85  Temp(Src) 96.2 F (35.7 C) (Tympanic)  Resp 20  Ht 5\' 6"  (1.676 m)  Wt 102.513 kg (226 lb)  BMI 36.49 kg/m2  SpO2 96% General:   Alert,  pleasant and cooperative in NAD Head:  Normocephalic and  atraumatic. Neck:  Supple; no masses or thyromegaly. Lungs:  Clear throughout to auscultation.    Heart:  Regular rate and rhythm. Abdomen:  Soft, nontender and nondistended. Normal bowel sounds, without guarding, and without rebound.   Neurologic:  Alert and  oriented x4;  grossly normal neurologically.  Impression/Plan: [name] is here for an endoscopy to be performed for dysphagia  Risks, benefits, limitations, and alternatives regarding  endoscopy have been reviewed with the patient.  Questions have been answered.  All parties agreeable.   Gaylyn Cheers, MD  08/31/2015, 10:16 AM

## 2015-08-31 NOTE — Op Note (Signed)
[facility] Patient Name: Jeremy Gordon Procedure Date: 08/31/2015 10:18 AM MRN: [id number] Account #: [id number] Date of Birth: 1940-05-13 Admit Type: Outpatient Age: 75 Room: [facility] ENDO ROOM 1 Gender: Male Note Status: Finalized Procedure:            Upper GI endoscopy Indications:          Dysphagia Providers:            Manya Silvas, MD Medicines:            Propofol per Anesthesia Complications:        No immediate complications. Procedure:            Pre-Anesthesia Assessment:                       - After reviewing the risks and benefits, the patient                        was deemed in satisfactory condition to undergo the                        procedure.                       After obtaining informed consent, the endoscope was                        passed under direct vision. Throughout the procedure,                        the patient's blood pressure, pulse, and oxygen                        saturations were monitored continuously. The Endoscope                        was introduced through the mouth, and advanced to the                        second part of duodenum. The upper GI endoscopy was                        accomplished without difficulty. The patient tolerated                        the procedure well. Findings:      LA Grade C (one or more mucosal breaks continuous between tops of 2 or       more mucosal folds, less than 75% circumference) esophagitis with no       bleeding was found 33 cm from the incisors. Biopsies were taken with a       cold forceps for histology.      Diffuse and patchy severe inflammation characterized by erosions,       erythema, friability and shallow ulcerations was found in the gastric       body and in the gastric antrum. Biopsies were taken with a cold forceps       for histology. Patches of ulceration and erythematosis were seen and       biopsied, as were areas of ulceration. Irregular  inflammation and       ulceration with some areas broadly elevated  from 33cm to GEJ. Biopsies       done.      Patchy mild inflammation characterized by erythema and granularity was       found in the gastric body.      The examined duodenum was normal. 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 Biopsied.                       - Gastritis. Biopsied.                       - Gastritis.                       - Normal examined duodenum. Recommendation:       - Await pathology results. Pureed diet, Milk shakes,                        other liquids. Manya Silvas, MD 08/31/2015 10:45:01 AM This report has been signed electronically. Number of Addenda: 0 Note Initiated On: 08/31/2015 10:18 AM      [facility]

## 2015-08-31 NOTE — Anesthesia Preprocedure Evaluation (Signed)
Anesthesia Evaluation  Patient identified by MRN, date of birth, ID band Patient awake    Reviewed: Allergy & Precautions, H&P , NPO status , Patient's Chart, lab work & pertinent test results, reviewed documented beta blocker date and time   Airway Mallampati: II   Neck ROM: full    Dental  (+) Poor Dentition, Edentulous Upper, Edentulous Lower   Pulmonary neg pulmonary ROS, shortness of breath, with exertion and Long-Term Oxygen Therapy, sleep apnea and Continuous Positive Airway Pressure Ventilation , pneumonia, resolved, COPD, Current Smoker,    Pulmonary exam normal        Cardiovascular hypertension, negative cardio ROS Normal cardiovascular exam+ dysrhythmias  Rate:Normal     Neuro/Psych  Neuromuscular disease negative neurological ROS  negative psych ROS   GI/Hepatic negative GI ROS, Neg liver ROS, GERD  Medicated,  Endo/Other  negative endocrine ROS  Renal/GU negative Renal ROS  negative genitourinary   Musculoskeletal   Abdominal   Peds  Hematology negative hematology ROS (+)   Anesthesia Other Findings Past Medical History:   Sleep apnea                                                  Persistent atrial fibrillation ([place])                           Comment:a. 07/2013 Echo: EF 60-65%, mod dil LA, mild TR;              CHA2DS2VASc = 3-->chronic coumadin.   Essential hypertension                                       History of stress test                                         Comment:a. 06/2003 MV: EF 56%, no ischemia/infarct.   COPD (chronic obstructive pulmonary disease) (*              Esophageal carcinoma ([facility])                                     Comment:a. Chemo: FOLFIRI   Dysrhythmia                                                  Atrial fibrillation ([facility])                                    Arthritis                                                  Past Surgical History:   PERIPHERAL VASCULAR  CATHETERIZATION  N/A 11/20/2014       Comment:Procedure: Porta Cath Insertion;  Surgeon:               Algernon Huxley, MD;  Location: [place] [facility]               [facility];  Service: Cardiovascular;  Laterality:               N/A;   HERNIA REPAIR                                               BMI    Body Mass Index   36.49 kg/m 2     Reproductive/Obstetrics                             Anesthesia Physical Anesthesia Plan  ASA: IV  Anesthesia Plan: General   Post-op Pain Management:    Induction:   Airway Management Planned:   Additional Equipment:   Intra-op Plan:   Post-operative Plan:   Informed Consent: I have reviewed the patients History and Physical, chart, labs and discussed the procedure including the risks, benefits and alternatives for the proposed anesthesia with the patient or authorized representative who has indicated his/her understanding and acceptance.   Dental Advisory Given  Plan Discussed with: CRNA  Anesthesia Plan Comments:         Anesthesia Quick Evaluation

## 2015-09-01 LAB — SURGICAL PATHOLOGY

## 2015-09-02 ENCOUNTER — Encounter: Payer: Self-pay | Admitting: Unknown Physician Specialty

## 2015-09-03 NOTE — Anesthesia Postprocedure Evaluation (Signed)
Anesthesia Post Note  Patient: Jeremy Gordon  Procedure(s) Performed: Procedure(s) (LRB): ESOPHAGOGASTRODUODENOSCOPY (EGD) WITH PROPOFOL (N/A)  Patient location during evaluation: PACU Anesthesia Type: General Level of consciousness: awake and alert Pain management: pain level controlled Vital Signs Assessment: post-procedure vital signs reviewed and stable Respiratory status: spontaneous breathing, nonlabored ventilation, respiratory function stable and patient connected to nasal cannula oxygen Cardiovascular status: blood pressure returned to baseline and stable Postop Assessment: no signs of nausea or vomiting Anesthetic complications: no    Last Vitals:  Filed Vitals:   08/31/15 1100 08/31/15 1110  BP: 115/78 112/92  Pulse: 92 101  Temp:    Resp: 20 18    Last Pain:  Filed Vitals:   09/01/15 0807  PainSc: 0-No pain                 Molli Barrows

## 2015-09-04 ENCOUNTER — Telehealth: Payer: Self-pay | Admitting: Oncology

## 2015-09-04 NOTE — Telephone Encounter (Signed)
Cari called from Dr. Edd Fabian office at [facility]. She said Mr. Serrette does have cancer again and to please call her to discuss this. Thanks. [phone]

## 2015-09-04 NOTE — Telephone Encounter (Signed)
Please schedule pt back to see me next week.

## 2015-09-08 ENCOUNTER — Inpatient Hospital Stay (HOSPITAL_BASED_OUTPATIENT_CLINIC_OR_DEPARTMENT_OTHER): Payer: Medicare Other | Admitting: Oncology

## 2015-09-08 VITALS — BP 127/71 | HR 58 | Resp 20 | Wt 227.3 lb

## 2015-09-08 DIAGNOSIS — R112 Nausea with vomiting, unspecified: Secondary | ICD-10-CM

## 2015-09-08 DIAGNOSIS — R630 Anorexia: Secondary | ICD-10-CM

## 2015-09-08 DIAGNOSIS — R4702 Dysphasia: Secondary | ICD-10-CM

## 2015-09-08 DIAGNOSIS — C159 Malignant neoplasm of esophagus, unspecified: Secondary | ICD-10-CM

## 2015-09-08 DIAGNOSIS — F17210 Nicotine dependence, cigarettes, uncomplicated: Secondary | ICD-10-CM

## 2015-09-08 DIAGNOSIS — Z7901 Long term (current) use of anticoagulants: Secondary | ICD-10-CM

## 2015-09-08 DIAGNOSIS — R109 Unspecified abdominal pain: Secondary | ICD-10-CM

## 2015-09-08 DIAGNOSIS — C155 Malignant neoplasm of lower third of esophagus: Secondary | ICD-10-CM

## 2015-09-08 DIAGNOSIS — I10 Essential (primary) hypertension: Secondary | ICD-10-CM

## 2015-09-08 DIAGNOSIS — I4891 Unspecified atrial fibrillation: Secondary | ICD-10-CM

## 2015-09-08 DIAGNOSIS — Z79899 Other long term (current) drug therapy: Secondary | ICD-10-CM

## 2015-09-08 DIAGNOSIS — G4730 Sleep apnea, unspecified: Secondary | ICD-10-CM

## 2015-09-08 NOTE — Progress Notes (Signed)
Patient still has difficulty eating but was able to eat last night.

## 2015-09-10 DIAGNOSIS — C155 Malignant neoplasm of lower third of esophagus: Secondary | ICD-10-CM | POA: Insufficient documentation

## 2015-09-10 NOTE — Progress Notes (Addendum)
[place]  [phone] [phone]) [phone]  ID: Jeremy Gordon OB: January 04, 1941  MR#: [id number]  XQJ#:[id number]  Patient Care Team: [name], MD as PCP - General (Geriatric Medicine) No Pcp Per Patient (General Practice) Manya Silvas, MD (Gastroenterology)  CHIEF COMPLAINT: Malignant neoplasm of lower third of esophagus.  INTERVAL HISTORY: Patient returns to clinic today for further evaluation, discussion of his pathology results, and treatment planning. He continues to have intermittent dysphagia and difficulty swallowing. He has no neurologic complaints. He denies any recent fevers. He denies any shortness of breath or chest pain. He denies any nausea, constipation, or diarrhea. He has no melena or hematochezia. He has no urinary complaints. Patient offers no further specific complaints today.  REVIEW OF SYSTEMS:   Review of Systems  Constitutional: Negative for fever, weight loss and malaise/fatigue.  HENT:       Dysphasia.  Respiratory: Negative.  Negative for shortness of breath.   Cardiovascular: Negative for chest pain.  Gastrointestinal: Negative for nausea, abdominal pain, blood in stool and melena.  Musculoskeletal: Negative.   Neurological: Negative.  Negative for weakness.  Endo/Heme/Allergies: Does not bruise/bleed easily.    As per HPI. Otherwise, a complete review of systems is negatve.   PAST MEDICAL HISTORY:  Hypertension, sleep apnea, atrial fibrillation.  PAST SURGICAL HISTORY: Negative.  FAMILY HISTORY: Reviewed and unchanged. No report of malignancy or chronic disease.     ADVANCED DIRECTIVES:    HEALTH MAINTENANCE: Social History  Substance Use Topics  . Smoking status: Current Some Day Smoker -- 62 years  . Smokeless tobacco: Current User    Types: Snuff  . Alcohol Use: No     No Known Allergies  Current Outpatient Prescriptions  Medication Sig Dispense Refill  . albuterol (PROVENTIL HFA;VENTOLIN  HFA) 108 (90 BASE) MCG/ACT inhaler Inhale 2 puffs into the lungs every 4 (four) hours as needed for wheezing or shortness of breath.    . cefUROXime (CEFTIN) 500 MG tablet Take 1 tablet (500 mg total) by mouth 2 (two) times daily. 20 tablet 0  . chlorpheniramine-HYDROcodone (TUSSIONEX PENNKINETIC ER) 10-8 MG/5ML SUER Take 5 mLs by mouth every 12 (twelve) hours as needed for cough. 115 mL 0  . digoxin (LANOXIN) 0.125 MG tablet Take 1 tablet (0.125 mg total) by mouth daily. 30 tablet 0  . diltiazem (CARDIZEM CD) 360 MG 24 hr capsule Take 1 capsule (360 mg total) by mouth daily. 30 capsule 0  . enalapril (VASOTEC) 20 MG tablet Take 20 mg by mouth 2 (two) times daily.    . finasteride (PROSCAR) 5 MG tablet Take 5 mg by mouth daily.    Marland Kitchen gabapentin (NEURONTIN) 300 MG capsule Take 300 mg by mouth 2 (two) times daily.    Marland Kitchen HYDROcodone-acetaminophen (NORCO) 10-325 MG tablet Take 1 tablet by mouth every 6 (six) hours as needed for severe pain. 20 tablet 0  . magic mouthwash SOLN Take 5-10 mLs by mouth 4 (four) times daily. 480 mL 2  . omeprazole (PRILOSEC) 20 MG capsule Take 20 mg by mouth 2 (two) times daily.    . sucralfate (CARAFATE) 1 GM/10ML suspension Take 10 mLs (1 g total) by mouth 2 (two) times daily. 420 mL 2  . warfarin (COUMADIN) 4 MG tablet Take 2 tablets (8 mg total) by mouth every evening. 60 tablet 0   No current facility-administered medications for this visit.   Facility-Administered Medications Ordered in Other Visits  Medication Dose Route Frequency Provider Last Rate  Last Dose  . sodium chloride 0.9 % injection 10 mL  10 mL Intravenous PRN Lloyd Huger, MD   10 mL at 12/23/14 0945  . sodium chloride 0.9 % injection 10 mL  10 mL Intracatheter PRN Lloyd Huger, MD   10 mL at 12/30/14 0947  . sodium chloride 0.9 % injection 10 mL  10 mL Intravenous PRN Lloyd Huger, MD   10 mL at 01/15/15 1512    OBJECTIVE: Filed Vitals:   09/08/15 1025  BP: 127/71  Pulse: 58    Resp: 20     Body mass index is 36.7 kg/(m^2).    ECOG FS:1 - Symptomatic but completely ambulatory  General: Well-developed, well-nourished, no acute distress. Eyes: anicteric sclera. Lungs: Clear to auscultation bilaterally. Heart: Regular rate and rhythm. No rubs, murmurs, or gallops. Abdomen: Soft, nontender, nondistended. No organomegaly noted, normoactive bowel sounds. Musculoskeletal: No edema, cyanosis, or clubbing. Neuro: Alert, answering all questions appropriately. Cranial nerves grossly intact. Skin: No rashes or petechiae noted. Psych: Normal affect.   LAB RESULTS:  Lab Results  Component Value Date   NA 135 08/19/2015   K 3.9 08/19/2015   CL 100* 08/19/2015   CO2 28 08/19/2015   GLUCOSE 162* 08/19/2015   BUN 16 08/19/2015   CREATININE 1.05 08/19/2015   CALCIUM 9.1 08/19/2015   PROT 6.8 08/19/2015   ALBUMIN 3.8 08/19/2015   AST 25 08/19/2015   ALT 15* 08/19/2015   ALKPHOS 87 08/19/2015   BILITOT 0.6 08/19/2015   GFRNONAA >60 08/19/2015   GFRAA >60 08/19/2015    Lab Results  Component Value Date   WBC 5.4 08/19/2015   NEUTROABS 3.2 08/19/2015   HGB 14.9 08/19/2015   HCT 44.3 08/19/2015   MCV 86.4 08/19/2015   PLT 154 08/19/2015     STUDIES: No results found.  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  PLAN:    1. 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  Patient's most recent EGD confirmed recurrence of his malignancy. Will get PET scan to further assess the extent of his disease. Patient is not a surgical candidate and no longer can receive XRT. Patient will return to clinic on September 22, 2015 to initiate cycle 1 day 1 of Ramucirumab and Taxol. Will give Ramucirumab 8 mg/kg on days 1 and 15 and Taxol 80 mg/kg on days 1, 8, and 15. This will be a 28 day cycle and will reimage after 3 months in September 2017.  2. Poor appetite: Patient  does not require IV fluids today.  Approximately 30 minutes was spent in discussion of which greater than 50% was consultation.   Patient expressed understanding and was in agreement with this plan. He also understands that He can call clinic at any time with any questions, concerns, or complaints.    Lloyd Huger, MD   09/10/2015 8:41 AM

## 2015-09-11 ENCOUNTER — Telehealth: Payer: Self-pay | Admitting: *Deleted

## 2015-09-11 ENCOUNTER — Ambulatory Visit: Payer: Medicare Other | Admitting: Oncology

## 2015-09-11 NOTE — Telephone Encounter (Signed)
Called to report that he cannot swallow even soft foods and boost comes back up too. Per Dr Grayland Ormond he needs a PEG tube, but patient refusing. I advised Enid Derry of this and she will discuss with pt and she was instructed for him to go to ER if he has more problems over the wekend

## 2015-09-18 ENCOUNTER — Ambulatory Visit
Admission: RE | Admit: 2015-09-18 | Discharge: 2015-09-18 | Disposition: A | Discharge status: Home / Self Care | Payer: Medicare Other | Source: Ambulatory Visit | Attending: Oncology | Admitting: Oncology

## 2015-09-18 DIAGNOSIS — R935 Abnormal findings on diagnostic imaging of other abdominal regions, including retroperitoneum: Secondary | ICD-10-CM | POA: Insufficient documentation

## 2015-09-18 DIAGNOSIS — K4090 Unilateral inguinal hernia, without obstruction or gangrene, not specified as recurrent: Secondary | ICD-10-CM | POA: Insufficient documentation

## 2015-09-18 DIAGNOSIS — C155 Malignant neoplasm of lower third of esophagus: Secondary | ICD-10-CM | POA: Insufficient documentation

## 2015-09-18 LAB — GLUCOSE, CAPILLARY: Glucose-Capillary: 99 mg/dL (ref 65–99)

## 2015-09-18 MED ORDER — FLUDEOXYGLUCOSE F - 18 (FDG) INJECTION
12.8700 | Freq: Once | INTRAVENOUS | Status: AC | PRN
  Administered 2015-09-18: 12.87 via INTRAVENOUS

## 2015-09-21 ENCOUNTER — Other Ambulatory Visit: Payer: Self-pay | Admitting: *Deleted

## 2015-09-21 MED ORDER — PROCHLORPERAZINE MALEATE 10 MG PO TABS: 10.0000 mg | ORAL_TABLET | Freq: Four times a day (QID) | ORAL | Status: DC | PRN

## 2015-09-22 ENCOUNTER — Inpatient Hospital Stay: Payer: Medicare Other | Attending: Oncology

## 2015-09-22 ENCOUNTER — Inpatient Hospital Stay (HOSPITAL_BASED_OUTPATIENT_CLINIC_OR_DEPARTMENT_OTHER): Payer: Medicare Other | Admitting: Oncology

## 2015-09-22 ENCOUNTER — Inpatient Hospital Stay: Payer: Medicare Other

## 2015-09-22 VITALS — BP 144/73 | HR 64 | Temp 97.3°F | Resp 20 | Wt 220.7 lb

## 2015-09-22 VITALS — BP 135/80 | HR 76 | Temp 96.1°F | Resp 20

## 2015-09-22 DIAGNOSIS — K219 Gastro-esophageal reflux disease without esophagitis: Secondary | ICD-10-CM | POA: Insufficient documentation

## 2015-09-22 DIAGNOSIS — R1310 Dysphagia, unspecified: Secondary | ICD-10-CM | POA: Insufficient documentation

## 2015-09-22 DIAGNOSIS — R630 Anorexia: Secondary | ICD-10-CM

## 2015-09-22 DIAGNOSIS — R5383 Other fatigue: Secondary | ICD-10-CM | POA: Diagnosis not present

## 2015-09-22 DIAGNOSIS — E876 Hypokalemia: Secondary | ICD-10-CM | POA: Insufficient documentation

## 2015-09-22 DIAGNOSIS — G4730 Sleep apnea, unspecified: Secondary | ICD-10-CM

## 2015-09-22 DIAGNOSIS — F17210 Nicotine dependence, cigarettes, uncomplicated: Secondary | ICD-10-CM

## 2015-09-22 DIAGNOSIS — D72819 Decreased white blood cell count, unspecified: Secondary | ICD-10-CM | POA: Insufficient documentation

## 2015-09-22 DIAGNOSIS — Z79899 Other long term (current) drug therapy: Secondary | ICD-10-CM | POA: Insufficient documentation

## 2015-09-22 DIAGNOSIS — C159 Malignant neoplasm of esophagus, unspecified: Secondary | ICD-10-CM

## 2015-09-22 DIAGNOSIS — I4891 Unspecified atrial fibrillation: Secondary | ICD-10-CM

## 2015-09-22 DIAGNOSIS — K449 Diaphragmatic hernia without obstruction or gangrene: Secondary | ICD-10-CM | POA: Diagnosis not present

## 2015-09-22 DIAGNOSIS — R531 Weakness: Secondary | ICD-10-CM | POA: Insufficient documentation

## 2015-09-22 DIAGNOSIS — D696 Thrombocytopenia, unspecified: Secondary | ICD-10-CM | POA: Insufficient documentation

## 2015-09-22 DIAGNOSIS — I10 Essential (primary) hypertension: Secondary | ICD-10-CM

## 2015-09-22 DIAGNOSIS — I2510 Atherosclerotic heart disease of native coronary artery without angina pectoris: Secondary | ICD-10-CM | POA: Insufficient documentation

## 2015-09-22 DIAGNOSIS — C155 Malignant neoplasm of lower third of esophagus: Secondary | ICD-10-CM | POA: Diagnosis present

## 2015-09-22 DIAGNOSIS — Z5111 Encounter for antineoplastic chemotherapy: Secondary | ICD-10-CM | POA: Diagnosis not present

## 2015-09-22 DIAGNOSIS — Z7901 Long term (current) use of anticoagulants: Secondary | ICD-10-CM

## 2015-09-22 LAB — CBC WITH DIFFERENTIAL/PLATELET
Basophils Absolute: 0 10*3/uL (ref 0–0.1)
Basophils Relative: 1 %
EOS ABS: 0.2 10*3/uL (ref 0–0.7)
Eosinophils Relative: 5 %
HEMATOCRIT: 42.3 % (ref 40.0–52.0)
HEMOGLOBIN: 14.3 g/dL (ref 13.0–18.0)
LYMPHS ABS: 1.4 10*3/uL (ref 1.0–3.6)
LYMPHS PCT: 27 %
MCH: 28.3 pg (ref 26.0–34.0)
MCHC: 33.8 g/dL (ref 32.0–36.0)
MCV: 83.8 fL (ref 80.0–100.0)
Monocytes Absolute: 0.6 10*3/uL (ref 0.2–1.0)
Monocytes Relative: 11 %
NEUTROS ABS: 3.1 10*3/uL (ref 1.4–6.5)
NEUTROS PCT: 56 %
Platelets: 130 10*3/uL — ABNORMAL LOW (ref 150–440)
RBC: 5.05 MIL/uL (ref 4.40–5.90)
RDW: 15.1 % — ABNORMAL HIGH (ref 11.5–14.5)
WBC: 5.4 10*3/uL (ref 3.8–10.6)

## 2015-09-22 LAB — COMPREHENSIVE METABOLIC PANEL
ALT: 14 U/L — ABNORMAL LOW (ref 17–63)
ANION GAP: 3 — AB (ref 5–15)
AST: 23 U/L (ref 15–41)
Albumin: 3.7 g/dL (ref 3.5–5.0)
Alkaline Phosphatase: 74 U/L (ref 38–126)
BILIRUBIN TOTAL: 0.8 mg/dL (ref 0.3–1.2)
BUN: 18 mg/dL (ref 6–20)
CHLORIDE: 105 mmol/L (ref 101–111)
CO2: 30 mmol/L (ref 22–32)
Calcium: 8.5 mg/dL — ABNORMAL LOW (ref 8.9–10.3)
Creatinine, Ser: 0.87 mg/dL (ref 0.61–1.24)
GFR calc Af Amer: >60 mL/min (ref >60)
Glucose, Bld: 80 mg/dL (ref 65–99)
POTASSIUM: 3.3 mmol/L — AB (ref 3.5–5.1)
SODIUM: 138 mmol/L (ref 135–145)
TOTAL PROTEIN: 6.3 g/dL — AB (ref 6.5–8.1)

## 2015-09-22 MED ORDER — DIPHENHYDRAMINE HCL 50 MG/ML IJ SOLN
25.0000 mg | Freq: Once | INTRAMUSCULAR | Status: AC
  Administered 2015-09-22: 25 mg via INTRAVENOUS
  Filled 2015-09-22: qty 1

## 2015-09-22 MED ORDER — SODIUM CHLORIDE 0.9 % IV SOLN
10.0000 mg | Freq: Once | INTRAVENOUS | Status: AC
  Administered 2015-09-22: 10 mg via INTRAVENOUS
  Filled 2015-09-22: qty 1

## 2015-09-22 MED ORDER — HEPARIN SOD (PORK) LOCK FLUSH 100 UNIT/ML IV SOLN
500.0000 [IU] | Freq: Once | INTRAVENOUS | Status: AC
  Administered 2015-09-22: 500 [IU] via INTRAVENOUS
  Filled 2015-09-22: qty 5

## 2015-09-22 MED ORDER — DIPHENHYDRAMINE HCL 50 MG/ML IJ SOLN
25.0000 mg | Freq: Once | INTRAMUSCULAR | Status: AC
  Administered 2015-09-22: 25 mg via INTRAVENOUS

## 2015-09-22 MED ORDER — SODIUM CHLORIDE 0.9 % IV SOLN
8.0000 mg/kg | Freq: Once | INTRAVENOUS | Status: AC
  Administered 2015-09-22: 800 mg via INTRAVENOUS
  Filled 2015-09-22: qty 70

## 2015-09-22 MED ORDER — HYDROCORTISONE NA SUCCINATE PF 100 MG IJ SOLR
100.0000 mg | Freq: Once | INTRAMUSCULAR | Status: AC
  Administered 2015-09-22: 100 mg via INTRAVENOUS

## 2015-09-22 MED ORDER — ACETAMINOPHEN 325 MG PO TABS
650.0000 mg | ORAL_TABLET | Freq: Once | ORAL | Status: AC
  Administered 2015-09-22: 650 mg via ORAL
  Filled 2015-09-22: qty 2

## 2015-09-22 MED ORDER — SODIUM CHLORIDE 0.9 % IV SOLN
Freq: Once | INTRAVENOUS | Status: AC
  Administered 2015-09-22: 11:00:00 via INTRAVENOUS
  Filled 2015-09-22: qty 1000

## 2015-09-22 MED ORDER — PACLITAXEL CHEMO INJECTION 300 MG/50ML
80.0000 mg/m2 | Freq: Once | INTRAVENOUS | Status: DC
  Filled 2015-09-22: qty 29

## 2015-09-22 MED ORDER — FAMOTIDINE IN NACL 20-0.9 MG/50ML-% IV SOLN
20.0000 mg | Freq: Once | INTRAVENOUS | Status: AC
  Administered 2015-09-22: 20 mg via INTRAVENOUS
  Filled 2015-09-22: qty 50

## 2015-09-22 MED ORDER — SODIUM CHLORIDE 0.9% FLUSH
10.0000 mL | INTRAVENOUS | Status: DC | PRN
  Filled 2015-09-22: qty 10

## 2015-09-22 NOTE — Progress Notes (Signed)
Began Cyramza at 11:33 and at 12:05 noticed patient scratching his back, arms and legs, patient complained of sudden onset of itching and redness on arms, face, and legs.  Stopped Cyramza, began NS at 500/hr, took patient's vitals (see flowsheet), overhead paged Dr. Grayland Ormond.  Administered Benadryl 25 mg at 12:07 and Solutcortef 100 mg at 12:08.  Dr. Grayland Ormond examined the patient.  Plan is to NOT restart the Cyramza at this time.  Reevaluated patient again at 12:31 and patient still complaining of itching, visibly scratching, but states it is better, not as bad as before.  Called Dr. Grayland Ormond, plan is to watch patient for fifteen more minutes and if symptoms totally resolve begin Taxol.  If not, patient has return appointment next week and will schedule for Taxol at that time.  At 12:50 patient states itching is getting better, no longer visibly scratching, but not completely resolved.  Patient will return next week for appointment with Dr. Grayland Ormond to determine continued treatment plan.  LJ

## 2015-09-22 NOTE — Progress Notes (Signed)
Patient was able to eat a hamburger over the weekend but when he tried to [employer] it felt like it got stuck and wanted to come back up.  Does have trouble swalloing solids and liquids but does not have pain.

## 2015-09-27 NOTE — Progress Notes (Signed)
[place]  Telephone:(336) [phone] Fax:(336) [phone]  ID: Jeremy Gordon OB: 1940/10/19  MR#: [id number]  MWN#:[id number]  Patient Care Team: [name], MD as PCP - General (Geriatric Medicine) No Pcp Per Patient (General Practice) Manya Silvas, MD (Gastroenterology)  CHIEF COMPLAINT:  Chief Complaint  Patient presents with  . Esophageal Cancer    INTERVAL HISTORY: Patient returns to clinic today for further evaluation, and to initiate cycle 1, day 1 of Ramucirumab and Taxol. He continues to have intermittent dysphagia and difficulty swallowing. He has no neurologic complaints. He denies any recent fevers. He denies any shortness of breath or chest pain. He denies any nausea, constipation, or diarrhea. He has no melena or hematochezia. He has no urinary complaints. Patient offers no further specific complaints today.  REVIEW OF SYSTEMS:   Review of Systems  Constitutional: Negative for fever, weight loss and malaise/fatigue.  HENT:       Dysphasia.  Respiratory: Negative.  Negative for shortness of breath.   Cardiovascular: Negative for chest pain.  Gastrointestinal: Negative for nausea, abdominal pain, blood in stool and melena.  Musculoskeletal: Negative.   Neurological: Negative.  Negative for weakness.  Endo/Heme/Allergies: Does not bruise/bleed easily.    As per HPI. Otherwise, a complete review of systems is negatve.   PAST MEDICAL HISTORY:  Hypertension, sleep apnea, atrial fibrillation.  PAST SURGICAL HISTORY: Negative.  FAMILY HISTORY: Reviewed and unchanged. No report of malignancy or chronic disease.     ADVANCED DIRECTIVES:    HEALTH MAINTENANCE: Social History  Substance Use Topics  . Smoking status: Current Some Day Smoker -- 62 years  . Smokeless tobacco: Current User    Types: Snuff  . Alcohol Use: No     No Known Allergies  Current Outpatient Prescriptions  Medication Sig Dispense Refill  . albuterol (PROVENTIL  HFA;VENTOLIN HFA) 108 (90 BASE) MCG/ACT inhaler Inhale 2 puffs into the lungs every 4 (four) hours as needed for wheezing or shortness of breath.    . chlorpheniramine-HYDROcodone (TUSSIONEX PENNKINETIC ER) 10-8 MG/5ML SUER Take 5 mLs by mouth every 12 (twelve) hours as needed for cough. 115 mL 0  . digoxin (LANOXIN) 0.125 MG tablet Take 1 tablet (0.125 mg total) by mouth daily. 30 tablet 0  . diltiazem (CARDIZEM CD) 360 MG 24 hr capsule Take 1 capsule (360 mg total) by mouth daily. 30 capsule 0  . enalapril (VASOTEC) 20 MG tablet Take 20 mg by mouth 2 (two) times daily.    . finasteride (PROSCAR) 5 MG tablet Take 5 mg by mouth daily.    Marland Kitchen gabapentin (NEURONTIN) 300 MG capsule Take 300 mg by mouth 2 (two) times daily.    Marland Kitchen HYDROcodone-acetaminophen (NORCO) 10-325 MG tablet Take 1 tablet by mouth every 6 (six) hours as needed for severe pain. 20 tablet 0  . magic mouthwash SOLN Take 5-10 mLs by mouth 4 (four) times daily. 480 mL 2  . omeprazole (PRILOSEC) 20 MG capsule Take 20 mg by mouth 2 (two) times daily.    . prochlorperazine (COMPAZINE) 10 MG tablet Take 1 tablet (10 mg total) by mouth every 6 (six) hours as needed for nausea or vomiting. 30 tablet 1  . sucralfate (CARAFATE) 1 GM/10ML suspension Take 10 mLs (1 g total) by mouth 2 (two) times daily. 420 mL 2  . warfarin (COUMADIN) 4 MG tablet Take 2 tablets (8 mg total) by mouth every evening. 60 tablet 0   No current facility-administered medications for this visit.  Facility-Administered Medications Ordered in Other Visits  Medication Dose Route Frequency Provider Last Rate Last Dose  . sodium chloride 0.9 % injection 10 mL  10 mL Intravenous PRN Lloyd Huger, MD   10 mL at 12/23/14 0945  . sodium chloride 0.9 % injection 10 mL  10 mL Intravenous PRN Lloyd Huger, MD   10 mL at 01/15/15 1512    OBJECTIVE: Filed Vitals:   09/22/15 0943  BP: 144/73  Pulse: 64  Temp: 97.3 F (36.3 C)  Resp: 20     Body mass index is  35.64 kg/(m^2).    ECOG FS:1 - Symptomatic but completely ambulatory  General: Well-developed, well-nourished, no acute distress. Eyes: anicteric sclera. Lungs: Clear to auscultation bilaterally. Heart: Regular rate and rhythm. No rubs, murmurs, or gallops. Abdomen: Soft, nontender, nondistended. No organomegaly noted, normoactive bowel sounds. Musculoskeletal: No edema, cyanosis, or clubbing. Neuro: Alert, answering all questions appropriately. Cranial nerves grossly intact. Skin: No rashes or petechiae noted. Psych: Normal affect.   LAB RESULTS:  Lab Results  Component Value Date   NA 138 09/22/2015   K 3.3* 09/22/2015   CL 105 09/22/2015   CO2 30 09/22/2015   GLUCOSE 80 09/22/2015   BUN 18 09/22/2015   CREATININE 0.87 09/22/2015   CALCIUM 8.5* 09/22/2015   PROT 6.3* 09/22/2015   ALBUMIN 3.7 09/22/2015   AST 23 09/22/2015   ALT 14* 09/22/2015   ALKPHOS 74 09/22/2015   BILITOT 0.8 09/22/2015   GFRNONAA >60 09/22/2015   GFRAA >60 09/22/2015    Lab Results  Component Value Date   WBC 5.4 09/22/2015   NEUTROABS 3.1 09/22/2015   HGB 14.3 09/22/2015   HCT 42.3 09/22/2015   MCV 83.8 09/22/2015   PLT 130* 09/22/2015     STUDIES: Nm Pet Image Restag (ps) Skull Base To Thigh  09/18/2015  CLINICAL DATA:  Subsequent treatment strategy for esophageal cancer. Last chemotherapy was 06/02/2015. Most recent EGD confirmed recurrence of malignancy in the esophagus. EXAM: NUCLEAR MEDICINE PET SKULL BASE TO THIGH TECHNIQUE: 12.9 mCi F-18 FDG was injected intravenously. Full-ring PET imaging was performed from the skull base to thigh after the radiotracer. CT data was obtained and used for attenuation correction and anatomic localization. FASTING BLOOD GLUCOSE:  Value: 99 mg/dl COMPARISON:  07/23/2014. FINDINGS: NECK No hypermetabolic lymph nodes in the neck. CHEST No hypermetabolic mediastinal or hilar nodes. No suspicious pulmonary nodules on the CT scan. Right PICC line tip is  positioned at the junction of the SVC and RA. Coronary artery calcification is evident. Marked hypermetabolism is now evident in the distal esophagus with SUV max = 13.6. No evidence for hypermetabolic lymph nodes in the mediastinum or either hilum. ABDOMEN/PELVIS No abnormal hypermetabolic activity within the liver, pancreas, adrenal glands, or spleen. FDG accumulation within the hepatic parenchyma is heterogeneous and mottled, but no definite hepatic metastases are evident. No hypermetabolic lymph nodes in the abdomen or pelvis. There is abdominal aortic atherosclerosis without aneurysm. 2.3 cm soft tissue attenuating nodule seen in the subcutaneous fat of the left flank region is not substantially changed in the interval. This remains hypermetabolic. As on the prior study, there is focal FDG accumulation in a left inguinal hernia containing only fat. SKELETON No focal hypermetabolic activity to suggest skeletal metastasis. IMPRESSION: 1. Interval development of marked hypermetabolism in the distal esophagus, consistent with recurrent neoplasm. 2. No evidence for hypermetabolic metastatic disease in the chest, abdomen, or pelvis. 3. Persistent hypermetabolism within the left flank subcutaneous nodule,  potentially a focal area of infection/inflammation or sebaceous cyst with infection/inflammation. Overall imaging features are stable. 4. Persistent hypermetabolic focus in a fat containing left inguinal hernia likely related to chronic inflammation. . Electronically Signed   By: Misty Stanley M.D.   On: 09/18/2015 12:40    ASSESSMENT: Recurrent adenocarcinoma of the esophagus, HER-2 negative.  PLAN:    1. Esophageal cancer:  Patient's last chemotherapy was on June 02, 2015.  Patient's most recent EGD confirmed recurrence of his malignancy. PET scan results reviewed independently and reported as above with only evidence of local recurrence. Patient is not a surgical candidate and no longer can receive XRT.  Proceed with cycle 1 day 1 of Ramucirumab and Taxol. Will give Ramucirumab 8 mg/kg on days 1 and 15 and Taxol 80 mg/kg on days 1, 8, and 15. This will be a 28 day cycle and will reimage after 3 months in September 2017. Return to clinic in 1 week for consideration of cycle 1, day 8. 2. Poor appetite: Patient does not require IV fluids today.   Patient expressed understanding and was in agreement with this plan. He also understands that He can call clinic at any time with any questions, concerns, or complaints.    Lloyd Huger, MD   09/27/2015 6:56 AM

## 2015-09-29 ENCOUNTER — Other Ambulatory Visit: Payer: Self-pay | Admitting: *Deleted

## 2015-09-29 ENCOUNTER — Inpatient Hospital Stay: Payer: Medicare Other

## 2015-09-29 ENCOUNTER — Inpatient Hospital Stay (HOSPITAL_BASED_OUTPATIENT_CLINIC_OR_DEPARTMENT_OTHER): Payer: Medicare Other | Admitting: Oncology

## 2015-09-29 VITALS — BP 117/69 | HR 68 | Temp 98.6°F | Wt 226.2 lb

## 2015-09-29 VITALS — BP 132/77 | HR 80

## 2015-09-29 DIAGNOSIS — R1310 Dysphagia, unspecified: Secondary | ICD-10-CM

## 2015-09-29 DIAGNOSIS — I10 Essential (primary) hypertension: Secondary | ICD-10-CM

## 2015-09-29 DIAGNOSIS — C155 Malignant neoplasm of lower third of esophagus: Secondary | ICD-10-CM

## 2015-09-29 DIAGNOSIS — E876 Hypokalemia: Secondary | ICD-10-CM | POA: Diagnosis not present

## 2015-09-29 DIAGNOSIS — D696 Thrombocytopenia, unspecified: Secondary | ICD-10-CM | POA: Diagnosis not present

## 2015-09-29 DIAGNOSIS — C159 Malignant neoplasm of esophagus, unspecified: Secondary | ICD-10-CM

## 2015-09-29 DIAGNOSIS — R630 Anorexia: Secondary | ICD-10-CM

## 2015-09-29 DIAGNOSIS — F17210 Nicotine dependence, cigarettes, uncomplicated: Secondary | ICD-10-CM

## 2015-09-29 DIAGNOSIS — I2510 Atherosclerotic heart disease of native coronary artery without angina pectoris: Secondary | ICD-10-CM

## 2015-09-29 DIAGNOSIS — Z7901 Long term (current) use of anticoagulants: Secondary | ICD-10-CM

## 2015-09-29 DIAGNOSIS — Z79899 Other long term (current) drug therapy: Secondary | ICD-10-CM

## 2015-09-29 DIAGNOSIS — I4891 Unspecified atrial fibrillation: Secondary | ICD-10-CM

## 2015-09-29 DIAGNOSIS — K449 Diaphragmatic hernia without obstruction or gangrene: Secondary | ICD-10-CM

## 2015-09-29 DIAGNOSIS — G4730 Sleep apnea, unspecified: Secondary | ICD-10-CM

## 2015-09-29 LAB — CBC WITH DIFFERENTIAL/PLATELET
BASOS ABS: 0.1 10*3/uL (ref 0–0.1)
BASOS PCT: 1 %
Eosinophils Absolute: 0.2 10*3/uL (ref 0–0.7)
Eosinophils Relative: 4 %
HEMATOCRIT: 40.4 % (ref 40.0–52.0)
HEMOGLOBIN: 13.8 g/dL (ref 13.0–18.0)
Lymphocytes Relative: 17 %
Lymphs Abs: 1 10*3/uL (ref 1.0–3.6)
MCH: 28.4 pg (ref 26.0–34.0)
MCHC: 34.2 g/dL (ref 32.0–36.0)
MCV: 83.1 fL (ref 80.0–100.0)
MONOS PCT: 12 %
Monocytes Absolute: 0.7 10*3/uL (ref 0.2–1.0)
NEUTROS ABS: 3.8 10*3/uL (ref 1.4–6.5)
NEUTROS PCT: 66 %
Platelets: 118 10*3/uL — ABNORMAL LOW (ref 150–440)
RBC: 4.87 MIL/uL (ref 4.40–5.90)
RDW: 15.7 % — ABNORMAL HIGH (ref 11.5–14.5)
WBC: 5.8 10*3/uL (ref 3.8–10.6)

## 2015-09-29 LAB — COMPREHENSIVE METABOLIC PANEL
ALK PHOS: 85 U/L (ref 38–126)
ALT: 17 U/L (ref 17–63)
ANION GAP: 4 — AB (ref 5–15)
AST: 23 U/L (ref 15–41)
Albumin: 3.4 g/dL — ABNORMAL LOW (ref 3.5–5.0)
BUN: 13 mg/dL (ref 6–20)
CALCIUM: 8.3 mg/dL — AB (ref 8.9–10.3)
CO2: 30 mmol/L (ref 22–32)
Chloride: 101 mmol/L (ref 101–111)
Creatinine, Ser: 0.85 mg/dL (ref 0.61–1.24)
GFR calc non Af Amer: >60 mL/min (ref >60)
Glucose, Bld: 128 mg/dL — ABNORMAL HIGH (ref 65–99)
Potassium: 3 mmol/L — ABNORMAL LOW (ref 3.5–5.1)
SODIUM: 135 mmol/L (ref 135–145)
Total Bilirubin: 1.1 mg/dL (ref 0.3–1.2)
Total Protein: 6 g/dL — ABNORMAL LOW (ref 6.5–8.1)

## 2015-09-29 MED ORDER — SODIUM CHLORIDE 0.9% FLUSH
10.0000 mL | INTRAVENOUS | Status: DC | PRN
  Filled 2015-09-29: qty 10

## 2015-09-29 MED ORDER — HEPARIN SOD (PORK) LOCK FLUSH 100 UNIT/ML IV SOLN
500.0000 [IU] | Freq: Once | INTRAVENOUS | Status: AC | PRN
  Administered 2015-09-29: 500 [IU]
  Filled 2015-09-29: qty 5

## 2015-09-29 MED ORDER — SODIUM CHLORIDE 0.9% FLUSH
3.0000 mL | INTRAVENOUS | Status: DC | PRN
  Filled 2015-09-29: qty 3

## 2015-09-29 MED ORDER — SODIUM CHLORIDE 0.9 % IV SOLN: 40.0000 meq | Freq: Once | INTRAVENOUS | Status: DC

## 2015-09-29 MED ORDER — SODIUM CHLORIDE 0.9 % IV SOLN
40.0000 meq | Freq: Once | INTRAVENOUS | Status: AC
  Administered 2015-09-29: 40 meq via INTRAVENOUS
  Filled 2015-09-29: qty 20

## 2015-09-29 MED ORDER — HEPARIN SOD (PORK) LOCK FLUSH 100 UNIT/ML IV SOLN: 250.0000 [IU] | Freq: Once | INTRAVENOUS | Status: DC | PRN

## 2015-09-29 MED ORDER — MAGIC MOUTHWASH: 5.0000 mL | Freq: Four times a day (QID) | ORAL | Status: DC

## 2015-09-29 MED ORDER — SODIUM CHLORIDE 0.9 % IV SOLN
10.0000 mg | Freq: Once | INTRAVENOUS | Status: AC
  Administered 2015-09-29: 10 mg via INTRAVENOUS
  Filled 2015-09-29: qty 1

## 2015-09-29 MED ORDER — SODIUM CHLORIDE 0.9 % IV SOLN
Freq: Once | INTRAVENOUS | Status: AC
  Administered 2015-09-29: 10:00:00 via INTRAVENOUS
  Filled 2015-09-29: qty 1000

## 2015-09-29 MED ORDER — SUCRALFATE 1 GM/10ML PO SUSP: 1.0000 g | Freq: Three times a day (TID) | ORAL | Status: DC

## 2015-09-29 MED ORDER — DEXTROSE 5 % IV SOLN
80.0000 mg/m2 | Freq: Once | INTRAVENOUS | Status: AC
  Administered 2015-09-29: 174 mg via INTRAVENOUS
  Filled 2015-09-29: qty 29

## 2015-09-29 MED ORDER — FAMOTIDINE IN NACL 20-0.9 MG/50ML-% IV SOLN
20.0000 mg | Freq: Once | INTRAVENOUS | Status: AC
  Administered 2015-09-29: 20 mg via INTRAVENOUS
  Filled 2015-09-29: qty 50

## 2015-09-29 MED ORDER — DIPHENHYDRAMINE HCL 50 MG/ML IJ SOLN
25.0000 mg | Freq: Once | INTRAMUSCULAR | Status: AC
  Administered 2015-09-29: 25 mg via INTRAVENOUS
  Filled 2015-09-29: qty 1

## 2015-09-29 MED ORDER — ALTEPLASE 2 MG IJ SOLR: 2.0000 mg | Freq: Once | INTRAMUSCULAR | Status: DC | PRN

## 2015-10-05 NOTE — Progress Notes (Signed)
[place]  Telephone:(336) [phone] [phone]) [phone]  ID: Jeremy Gordon OB: [birth year]  MR#: [id number]  ZGY#:[id number]  Patient Care Team: [name], MD as PCP - General (Geriatric Medicine) No Pcp Per Patient (General Practice) Manya Silvas, MD (Gastroenterology)  CHIEF COMPLAINT:  Chief Complaint  Patient presents with  . Malignant neoplasm of lower third of esophagus ([place]    INTERVAL HISTORY: Patient returns to clinic today for further evaluation and consideration of cycle 1, day 8 of single agent Taxol. Patient had a reaction to Ramucirumab last week and this was discontinued. He continues to have intermittent dysphagia and difficulty swallowing. He has no neurologic complaints. He denies any recent fevers. He denies any shortness of breath or chest pain. He denies any nausea, constipation, or diarrhea. He has no melena or hematochezia. He has no urinary complaints. Patient offers no further specific complaints today.  REVIEW OF SYSTEMS:   Review of Systems  Constitutional: Negative for fever, weight loss and malaise/fatigue.  HENT:       Dysphasia.  Respiratory: Negative.  Negative for shortness of breath.   Cardiovascular: Negative for chest pain.  Gastrointestinal: Negative for nausea, abdominal pain, blood in stool and melena.  Musculoskeletal: Negative.   Neurological: Negative.  Negative for weakness.  Endo/Heme/Allergies: Does not bruise/bleed easily.    As per HPI. Otherwise, a complete review of systems is negatve.   PAST MEDICAL HISTORY:  Hypertension, sleep apnea, atrial fibrillation.  PAST SURGICAL HISTORY: Negative.  FAMILY HISTORY: Reviewed and unchanged. No report of malignancy or chronic disease.     ADVANCED DIRECTIVES:    HEALTH MAINTENANCE: Social History  Substance Use Topics  . Smoking status: Current Some Day Smoker -- 62 years  . Smokeless tobacco: Current User    Types: Snuff  . Alcohol Use: No     No  Known Allergies  Current Outpatient Prescriptions  Medication Sig Dispense Refill  . albuterol (PROAIR HFA) 108 (90 Base) MCG/ACT inhaler Inhale into the lungs.    . digoxin (LANOXIN) 0.125 MG tablet Take by mouth.    . diltiazem (CARDIZEM CD) 360 MG 24 hr capsule Take by mouth.    . diphenoxylate-atropine (LOMOTIL) 2.5-0.025 MG tablet Take by mouth.    . fluticasone (FLOVENT HFA) 220 MCG/ACT inhaler Inhale into the lungs.    . furosemide (LASIX) 20 MG tablet Take 20 mg by mouth.    Marland Kitchen HYDROcodone-acetaminophen (LORTAB) 10-325 MG tablet Take by mouth.    . metoprolol (LOPRESSOR) 50 MG tablet Take 50 mg by mouth.    . sucralfate (CARAFATE) 1 GM/10ML suspension Take 10 mLs (1 g total) by mouth 4 (four) times daily -  with meals and at bedtime. 420 mL 1  . warfarin (COUMADIN) 4 MG tablet     . cefUROXime (CEFTIN) 500 MG tablet Take 500 mg by mouth 2 (two) times daily.  0  . enalapril (VASOTEC) 20 MG tablet Take by mouth.    . finasteride (PROSCAR) 5 MG tablet Take 5 mg by mouth daily.    . fluticasone (FLONASE) 50 MCG/ACT nasal spray ONE SPRAY IN EACH NOSTRIL AT NIGHT AS DIRECTED  11  . gabapentin (NEURONTIN) 300 MG capsule Take by mouth.    Marland Kitchen HYDROcodone-acetaminophen (NORCO) 10-325 MG tablet Take 1 tablet by mouth every 6 (six) hours as needed for severe pain. 20 tablet 0  . magic mouthwash SOLN Take 5-10 mLs by mouth 4 (four) times daily. 480 mL 2  . omeprazole (PRILOSEC)  20 MG capsule Take by mouth.    . prochlorperazine (COMPAZINE) 10 MG tablet Take 1 tablet (10 mg total) by mouth every 6 (six) hours as needed for nausea or vomiting. 30 tablet 1  . tamsulosin (FLOMAX) 0.4 MG CAPS capsule Take 0.4 mg by mouth daily.  3  . warfarin (COUMADIN) 4 MG tablet Take 2 tablets (8 mg total) by mouth every evening. 60 tablet 0   No current facility-administered medications for this visit.   Facility-Administered Medications Ordered in Other Visits  Medication Dose Route Frequency Provider Last Rate  Last Dose  . sodium chloride 0.9 % injection 10 mL  10 mL Intravenous PRN Lloyd Huger, MD   10 mL at 12/23/14 0945  . sodium chloride 0.9 % injection 10 mL  10 mL Intravenous PRN Lloyd Huger, MD   10 mL at 01/15/15 1512    OBJECTIVE: Filed Vitals:   09/29/15 0925  BP: 117/69  Pulse: 68  Temp: 98.6 F (37 C)     Body mass index is 36.53 kg/(m^2).    ECOG FS:1 - Symptomatic but completely ambulatory  General: Well-developed, well-nourished, no acute distress. Eyes: anicteric sclera. Lungs: Clear to auscultation bilaterally. Heart: Regular rate and rhythm. No rubs, murmurs, or gallops. Abdomen: Soft, nontender, nondistended. No organomegaly noted, normoactive bowel sounds. Musculoskeletal: No edema, cyanosis, or clubbing. Neuro: Alert, answering all questions appropriately. Cranial nerves grossly intact. Skin: No rashes or petechiae noted. Psych: Normal affect.   LAB RESULTS:  Lab Results  Component Value Date   NA 135 09/29/2015   K 3.0* 09/29/2015   CL 101 09/29/2015   CO2 30 09/29/2015   GLUCOSE 128* 09/29/2015   BUN 13 09/29/2015   CREATININE 0.85 09/29/2015   CALCIUM 8.3* 09/29/2015   PROT 6.0* 09/29/2015   ALBUMIN 3.4* 09/29/2015   AST 23 09/29/2015   ALT 17 09/29/2015   ALKPHOS 85 09/29/2015   BILITOT 1.1 09/29/2015   GFRNONAA >60 09/29/2015   GFRAA >60 09/29/2015    Lab Results  Component Value Date   WBC 5.8 09/29/2015   NEUTROABS 3.8 09/29/2015   HGB 13.8 09/29/2015   HCT 40.4 09/29/2015   MCV 83.1 09/29/2015   PLT 118* 09/29/2015     STUDIES: Nm Pet Image Restag (ps) Skull Base To Thigh  09/18/2015  CLINICAL DATA:  Subsequent treatment strategy for esophageal cancer. Last chemotherapy was 06/02/2015. Most recent EGD confirmed recurrence of malignancy in the esophagus. EXAM: NUCLEAR MEDICINE PET SKULL BASE TO THIGH TECHNIQUE: 12.9 mCi F-18 FDG was injected intravenously. Full-ring PET imaging was performed from the skull base to thigh  after the radiotracer. CT data was obtained and used for attenuation correction and anatomic localization. FASTING BLOOD GLUCOSE:  Value: 99 mg/dl COMPARISON:  07/23/2014. FINDINGS: NECK No hypermetabolic lymph nodes in the neck. CHEST No hypermetabolic mediastinal or hilar nodes. No suspicious pulmonary nodules on the CT scan. Right PICC line tip is positioned at the junction of the SVC and RA. Coronary artery calcification is evident. Marked hypermetabolism is now evident in the distal esophagus with SUV max = 13.6. No evidence for hypermetabolic lymph nodes in the mediastinum or either hilum. ABDOMEN/PELVIS No abnormal hypermetabolic activity within the liver, pancreas, adrenal glands, or spleen. FDG accumulation within the hepatic parenchyma is heterogeneous and mottled, but no definite hepatic metastases are evident. No hypermetabolic lymph nodes in the abdomen or pelvis. There is abdominal aortic atherosclerosis without aneurysm. 2.3 cm soft tissue attenuating nodule seen in the subcutaneous  fat of the left flank region is not substantially changed in the interval. This remains hypermetabolic. As on the prior study, there is focal FDG accumulation in a left inguinal hernia containing only fat. SKELETON No focal hypermetabolic activity to suggest skeletal metastasis. IMPRESSION: 1. Interval development of marked hypermetabolism in the distal esophagus, consistent with recurrent neoplasm. 2. No evidence for hypermetabolic metastatic disease in the chest, abdomen, or pelvis. 3. Persistent hypermetabolism within the left flank subcutaneous nodule, potentially a focal area of infection/inflammation or sebaceous cyst with infection/inflammation. Overall imaging features are stable. 4. Persistent hypermetabolic focus in a fat containing left inguinal hernia likely related to chronic inflammation. . Electronically Signed   By: Misty Stanley M.D.   On: 09/18/2015 12:40    ASSESSMENT: Recurrent adenocarcinoma of the  esophagus, HER-2 negative.  PLAN:    1. Esophageal cancer:  Patient's last chemotherapy was on June 02, 2015.  Patient's most recent EGD confirmed recurrence of his malignancy. PET scan results reviewed independently and reported as above with only evidence of local recurrence. Patient is not a surgical candidate and no longer can receive XRT. Proceed with cycle 1 day 8 of single agent Taxol. Ramucirumab was discontinued secondary to reaction last week. Will Taxol 80 mg/kg on days 1, 8, and 15. This will be a 28 day cycle and will reimage after 3 months in September 2017. Return to clinic in 1 week for consideration of cycle 1, day 15. 2. Poor appetite: Patient does not require IV fluids today. 3. Hypokalemia: Patient was given dietary recommendations and will consider oral supplementation in the future. 4. Thrombocytopenia: Secondary to chemotherapy, monitor.   Patient expressed understanding and was in agreement with this plan. He also understands that He can call clinic at any time with any questions, concerns, or complaints.    Lloyd Huger, MD   10/05/2015 8:42 AM

## 2015-10-06 ENCOUNTER — Inpatient Hospital Stay: Payer: Medicare Other

## 2015-10-06 ENCOUNTER — Inpatient Hospital Stay (HOSPITAL_BASED_OUTPATIENT_CLINIC_OR_DEPARTMENT_OTHER): Payer: Medicare Other | Admitting: Oncology

## 2015-10-06 VITALS — BP 134/79 | HR 82 | Temp 98.4°F | Wt 230.6 lb

## 2015-10-06 DIAGNOSIS — R5383 Other fatigue: Secondary | ICD-10-CM

## 2015-10-06 DIAGNOSIS — C155 Malignant neoplasm of lower third of esophagus: Secondary | ICD-10-CM

## 2015-10-06 DIAGNOSIS — G4730 Sleep apnea, unspecified: Secondary | ICD-10-CM

## 2015-10-06 DIAGNOSIS — C801 Malignant (primary) neoplasm, unspecified: Secondary | ICD-10-CM

## 2015-10-06 DIAGNOSIS — I4891 Unspecified atrial fibrillation: Secondary | ICD-10-CM

## 2015-10-06 DIAGNOSIS — I2510 Atherosclerotic heart disease of native coronary artery without angina pectoris: Secondary | ICD-10-CM

## 2015-10-06 DIAGNOSIS — D696 Thrombocytopenia, unspecified: Secondary | ICD-10-CM

## 2015-10-06 DIAGNOSIS — E876 Hypokalemia: Secondary | ICD-10-CM | POA: Diagnosis not present

## 2015-10-06 DIAGNOSIS — K449 Diaphragmatic hernia without obstruction or gangrene: Secondary | ICD-10-CM

## 2015-10-06 DIAGNOSIS — R630 Anorexia: Secondary | ICD-10-CM | POA: Diagnosis not present

## 2015-10-06 DIAGNOSIS — F17210 Nicotine dependence, cigarettes, uncomplicated: Secondary | ICD-10-CM

## 2015-10-06 DIAGNOSIS — R1310 Dysphagia, unspecified: Secondary | ICD-10-CM

## 2015-10-06 DIAGNOSIS — C159 Malignant neoplasm of esophagus, unspecified: Secondary | ICD-10-CM

## 2015-10-06 DIAGNOSIS — I10 Essential (primary) hypertension: Secondary | ICD-10-CM

## 2015-10-06 DIAGNOSIS — R531 Weakness: Secondary | ICD-10-CM

## 2015-10-06 DIAGNOSIS — Z7901 Long term (current) use of anticoagulants: Secondary | ICD-10-CM

## 2015-10-06 DIAGNOSIS — Z79899 Other long term (current) drug therapy: Secondary | ICD-10-CM

## 2015-10-06 LAB — CBC WITH DIFFERENTIAL/PLATELET
BASOS PCT: 1 %
Basophils Absolute: 0 10*3/uL (ref 0–0.1)
EOS ABS: 0.1 10*3/uL (ref 0–0.7)
Eosinophils Relative: 4 %
HEMATOCRIT: 37.1 % — AB (ref 40.0–52.0)
Hemoglobin: 12.6 g/dL — ABNORMAL LOW (ref 13.0–18.0)
LYMPHS ABS: 0.8 10*3/uL — AB (ref 1.0–3.6)
Lymphocytes Relative: 21 %
MCH: 28.4 pg (ref 26.0–34.0)
MCHC: 33.9 g/dL (ref 32.0–36.0)
MCV: 84 fL (ref 80.0–100.0)
MONO ABS: 0.2 10*3/uL (ref 0.2–1.0)
MONOS PCT: 6 %
Neutro Abs: 2.6 10*3/uL (ref 1.4–6.5)
Neutrophils Relative %: 68 %
Platelets: 123 10*3/uL — ABNORMAL LOW (ref 150–440)
RBC: 4.42 MIL/uL (ref 4.40–5.90)
RDW: 16.1 % — AB (ref 11.5–14.5)
WBC: 3.8 10*3/uL (ref 3.8–10.6)

## 2015-10-06 LAB — COMPREHENSIVE METABOLIC PANEL
ALBUMIN: 3.1 g/dL — AB (ref 3.5–5.0)
ALK PHOS: 97 U/L (ref 38–126)
ALT: 22 U/L (ref 17–63)
AST: 21 U/L (ref 15–41)
Anion gap: 3 — ABNORMAL LOW (ref 5–15)
BUN: 14 mg/dL (ref 6–20)
CALCIUM: 8.4 mg/dL — AB (ref 8.9–10.3)
CHLORIDE: 106 mmol/L (ref 101–111)
CO2: 27 mmol/L (ref 22–32)
CREATININE: 0.77 mg/dL (ref 0.61–1.24)
GFR calc Af Amer: >60 mL/min (ref >60)
GFR calc non Af Amer: >60 mL/min (ref >60)
GLUCOSE: 134 mg/dL — AB (ref 65–99)
Potassium: 3.7 mmol/L (ref 3.5–5.1)
SODIUM: 136 mmol/L (ref 135–145)
Total Bilirubin: 0.9 mg/dL (ref 0.3–1.2)
Total Protein: 5.7 g/dL — ABNORMAL LOW (ref 6.5–8.1)

## 2015-10-06 MED ORDER — HEPARIN SOD (PORK) LOCK FLUSH 100 UNIT/ML IV SOLN
INTRAVENOUS | Status: AC
  Filled 2015-10-06: qty 5

## 2015-10-06 MED ORDER — MORPHINE SULFATE 2 MG/ML IJ SOLN
2.0000 mg | INTRAMUSCULAR | Status: DC | PRN
  Administered 2015-10-06: 2 mg via INTRAVENOUS
  Filled 2015-10-06: qty 1

## 2015-10-06 MED ORDER — SODIUM CHLORIDE 0.9 % IV SOLN
Freq: Once | INTRAVENOUS | Status: AC
  Administered 2015-10-06: 12:00:00 via INTRAVENOUS
  Filled 2015-10-06: qty 4

## 2015-10-06 MED ORDER — SODIUM CHLORIDE 0.9 % IV SOLN
Freq: Once | INTRAVENOUS | Status: AC
  Administered 2015-10-06: 11:00:00 via INTRAVENOUS
  Filled 2015-10-06: qty 1000

## 2015-10-06 MED ORDER — HEPARIN SOD (PORK) LOCK FLUSH 100 UNIT/ML IV SOLN
500.0000 [IU] | Freq: Once | INTRAVENOUS | Status: AC
  Administered 2015-10-06: 500 [IU] via INTRAVENOUS

## 2015-10-06 NOTE — Progress Notes (Signed)
[place]  Telephone:(336) [phone] Fax:(336) [phone]  ID: [name] OB: 31-Oct-1940  MR#: [id number]  UDJ#:[id number]  Patient Care Team: [name], MD as PCP - General (Geriatric Medicine) No Pcp Per Patient (General Practice) Manya Silvas, MD (Gastroenterology)  CHIEF COMPLAINT:  No chief complaint on file.   INTERVAL HISTORY: Patient returns to clinic today for further evaluation and consideration of cycle 1, day 15 of single agent Taxol. Patient had a reaction to Ramucirumab last week and this was discontinued. He patient states he has had increased weakness and fatigue and has "been in bed" for the last 3-4 days. He states his dysphagia is improved, but still evident. He has a poor appetite. He has no neurologic complaints. He denies any recent fevers. He denies any shortness of breath or chest pain. He denies any nausea, constipation, or diarrhea. He has no melena or hematochezia. He has no urinary complaints. Patient offers no further specific complaints today.  REVIEW OF SYSTEMS:   Review of Systems  Constitutional: Positive for malaise/fatigue. Negative for fever and weight loss.  HENT:       Dysphasia.  Respiratory: Negative.  Negative for shortness of breath.   Cardiovascular: Negative for chest pain.  Gastrointestinal: Negative for nausea, abdominal pain, blood in stool and melena.  Musculoskeletal: Negative.   Neurological: Positive for weakness.  Endo/Heme/Allergies: Does not bruise/bleed easily.    As per HPI. Otherwise, a complete review of systems is negatve.   PAST MEDICAL HISTORY:  Hypertension, sleep apnea, atrial fibrillation.  PAST SURGICAL HISTORY: Negative.  FAMILY HISTORY: Reviewed and unchanged. No report of malignancy or chronic disease.     ADVANCED DIRECTIVES:    HEALTH MAINTENANCE: Social History  Substance Use Topics  . Smoking status: Current Some Day Smoker -- 62 years  . Smokeless tobacco: Current User      Types: Snuff  . Alcohol Use: No     No Known Allergies  Current Outpatient Prescriptions  Medication Sig Dispense Refill  . albuterol (PROAIR HFA) 108 (90 Base) MCG/ACT inhaler Inhale into the lungs.    . digoxin (LANOXIN) 0.125 MG tablet Take by mouth.    . diphenoxylate-atropine (LOMOTIL) 2.5-0.025 MG tablet Take by mouth.    . enalapril (VASOTEC) 20 MG tablet Take by mouth.    . finasteride (PROSCAR) 5 MG tablet Take 5 mg by mouth daily.    . fluticasone (FLONASE) 50 MCG/ACT nasal spray ONE SPRAY IN EACH NOSTRIL AT NIGHT AS DIRECTED  11  . fluticasone (FLOVENT HFA) 220 MCG/ACT inhaler Inhale into the lungs.    . furosemide (LASIX) 20 MG tablet Take 20 mg by mouth.    . gabapentin (NEURONTIN) 300 MG capsule Take by mouth.    Marland Kitchen HYDROcodone-acetaminophen (LORTAB) 10-325 MG tablet Take by mouth.    Marland Kitchen HYDROcodone-acetaminophen (NORCO) 10-325 MG tablet Take 1 tablet by mouth every 6 (six) hours as needed for severe pain. 20 tablet 0  . magic mouthwash SOLN Take 5-10 mLs by mouth 4 (four) times daily. 480 mL 2  . metoprolol (LOPRESSOR) 50 MG tablet Take 50 mg by mouth.    . nystatin (MYCOSTATIN) 100000 UNIT/ML suspension     . omeprazole (PRILOSEC) 20 MG capsule Take by mouth.    . prochlorperazine (COMPAZINE) 10 MG tablet Take 1 tablet (10 mg total) by mouth every 6 (six) hours as needed for nausea or vomiting. 30 tablet 1  . sucralfate (CARAFATE) 1 GM/10ML suspension Take 10 mLs (1 g  total) by mouth 4 (four) times daily -  with meals and at bedtime. 420 mL 1  . tamsulosin (FLOMAX) 0.4 MG CAPS capsule Take 0.4 mg by mouth daily.  3  . warfarin (COUMADIN) 4 MG tablet Take 2 tablets (8 mg total) by mouth every evening. 60 tablet 0   Current Facility-Administered Medications  Medication Dose Route Frequency Provider Last Rate Last Dose  . morphine 2 MG/ML injection 2 mg  2 mg Intravenous Q2H PRN Lloyd Huger, MD   2 mg at 10/06/15 1202   Facility-Administered Medications Ordered  in Other Visits  Medication Dose Route Frequency Provider Last Rate Last Dose  . sodium chloride 0.9 % injection 10 mL  10 mL Intravenous PRN Lloyd Huger, MD   10 mL at 12/23/14 0945  . sodium chloride 0.9 % injection 10 mL  10 mL Intravenous PRN Lloyd Huger, MD   10 mL at 01/15/15 1512    OBJECTIVE: Filed Vitals:   10/06/15 1009  BP: 134/79  Pulse: 82  Temp: 98.4 F (36.9 C)     Body mass index is 37.24 kg/(m^2).    ECOG FS:1 - Symptomatic but completely ambulatory  General: Well-developed, well-nourished, no acute distress. Eyes: anicteric sclera. Lungs: Clear to auscultation bilaterally. Heart: Regular rate and rhythm. No rubs, murmurs, or gallops. Abdomen: Soft, nontender, nondistended. No organomegaly noted, normoactive bowel sounds. Musculoskeletal: No edema, cyanosis, or clubbing. Neuro: Alert, answering all questions appropriately. Cranial nerves grossly intact. Skin: No rashes or petechiae noted. Psych: Normal affect.   LAB RESULTS:  Lab Results  Component Value Date   NA 136 10/06/2015   K 3.7 10/06/2015   CL 106 10/06/2015   CO2 27 10/06/2015   GLUCOSE 134* 10/06/2015   BUN 14 10/06/2015   CREATININE 0.77 10/06/2015   CALCIUM 8.4* 10/06/2015   PROT 5.7* 10/06/2015   ALBUMIN 3.1* 10/06/2015   AST 21 10/06/2015   ALT 22 10/06/2015   ALKPHOS 97 10/06/2015   BILITOT 0.9 10/06/2015   GFRNONAA >60 10/06/2015   GFRAA >60 10/06/2015    Lab Results  Component Value Date   WBC 3.8 10/06/2015   NEUTROABS 2.6 10/06/2015   HGB 12.6* 10/06/2015   HCT 37.1* 10/06/2015   MCV 84.0 10/06/2015   PLT 123* 10/06/2015     STUDIES: Nm Pet Image Restag (ps) Skull Base To Thigh  09/18/2015  CLINICAL DATA:  Subsequent treatment strategy for esophageal cancer. Last chemotherapy was 06/02/2015. Most recent EGD confirmed recurrence of malignancy in the esophagus. EXAM: NUCLEAR MEDICINE PET SKULL BASE TO THIGH TECHNIQUE: 12.9 mCi F-18 FDG was injected  intravenously. Full-ring PET imaging was performed from the skull base to thigh after the radiotracer. CT data was obtained and used for attenuation correction and anatomic localization. FASTING BLOOD GLUCOSE:  Value: 99 mg/dl COMPARISON:  07/23/2014. FINDINGS: NECK No hypermetabolic lymph nodes in the neck. CHEST No hypermetabolic mediastinal or hilar nodes. No suspicious pulmonary nodules on the CT scan. Right PICC line tip is positioned at the junction of the SVC and RA. Coronary artery calcification is evident. Marked hypermetabolism is now evident in the distal esophagus with SUV max = 13.6. No evidence for hypermetabolic lymph nodes in the mediastinum or either hilum. ABDOMEN/PELVIS No abnormal hypermetabolic activity within the liver, pancreas, adrenal glands, or spleen. FDG accumulation within the hepatic parenchyma is heterogeneous and mottled, but no definite hepatic metastases are evident. No hypermetabolic lymph nodes in the abdomen or pelvis. There is abdominal aortic atherosclerosis  without aneurysm. 2.3 cm soft tissue attenuating nodule seen in the subcutaneous fat of the left flank region is not substantially changed in the interval. This remains hypermetabolic. As on the prior study, there is focal FDG accumulation in a left inguinal hernia containing only fat. SKELETON No focal hypermetabolic activity to suggest skeletal metastasis. IMPRESSION: 1. Interval development of marked hypermetabolism in the distal esophagus, consistent with recurrent neoplasm. 2. No evidence for hypermetabolic metastatic disease in the chest, abdomen, or pelvis. 3. Persistent hypermetabolism within the left flank subcutaneous nodule, potentially a focal area of infection/inflammation or sebaceous cyst with infection/inflammation. Overall imaging features are stable. 4. Persistent hypermetabolic focus in a fat containing left inguinal hernia likely related to chronic inflammation. . Electronically Signed   By: Misty Stanley M.D.   On: 09/18/2015 12:40    ASSESSMENT: Recurrent adenocarcinoma of the esophagus, HER-2 negative.  PLAN:    1. Esophageal cancer:  Patient's most recent EGD confirmed recurrence of his malignancy. PET scan results reviewed independently and reported as above with only evidence of local recurrence. Patient is not a surgical candidate and no longer can receive XRT. Delay cycle 1 day 15 of single agent Taxol secondary to increased weakness and fatigue. Patient will receive IV fluids as well as IV Decadron instead.  Ramucirumab was discontinued secondary to reaction. Patient may be only able to tolerate treatment on days 1 and 8 with single agent Taxol 80 mg/kg.  Reimage after 3 months in September 2017. Return to clinic in 1 week for reconsideration treatment. 2. Poor appetite: IV fluids and Decadron as above. 3. Hypokalemia: Resolved. Patient was given dietary recommendations and will consider oral supplementation in the future. 4. Thrombocytopenia: Secondary to chemotherapy, monitor.   Patient expressed understanding and was in agreement with this plan. He also understands that He can call clinic at any time with any questions, concerns, or complaints.    Lloyd Huger, MD   10/06/2015 11:24 PM

## 2015-10-13 ENCOUNTER — Inpatient Hospital Stay (HOSPITAL_BASED_OUTPATIENT_CLINIC_OR_DEPARTMENT_OTHER): Payer: Medicare Other | Admitting: Oncology

## 2015-10-13 ENCOUNTER — Inpatient Hospital Stay: Payer: Medicare Other

## 2015-10-13 ENCOUNTER — Inpatient Hospital Stay: Payer: Medicare Other | Admitting: Oncology

## 2015-10-13 ENCOUNTER — Other Ambulatory Visit: Payer: Self-pay | Admitting: *Deleted

## 2015-10-13 VITALS — BP 108/71 | HR 81 | Temp 98.0°F | Resp 18 | Wt 222.2 lb

## 2015-10-13 DIAGNOSIS — D696 Thrombocytopenia, unspecified: Secondary | ICD-10-CM

## 2015-10-13 DIAGNOSIS — R630 Anorexia: Secondary | ICD-10-CM | POA: Diagnosis not present

## 2015-10-13 DIAGNOSIS — I4891 Unspecified atrial fibrillation: Secondary | ICD-10-CM

## 2015-10-13 DIAGNOSIS — R5383 Other fatigue: Secondary | ICD-10-CM

## 2015-10-13 DIAGNOSIS — I10 Essential (primary) hypertension: Secondary | ICD-10-CM

## 2015-10-13 DIAGNOSIS — Z79899 Other long term (current) drug therapy: Secondary | ICD-10-CM

## 2015-10-13 DIAGNOSIS — C159 Malignant neoplasm of esophagus, unspecified: Secondary | ICD-10-CM

## 2015-10-13 DIAGNOSIS — E786 Lipoprotein deficiency: Secondary | ICD-10-CM | POA: Diagnosis not present

## 2015-10-13 DIAGNOSIS — R531 Weakness: Secondary | ICD-10-CM

## 2015-10-13 DIAGNOSIS — E876 Hypokalemia: Secondary | ICD-10-CM

## 2015-10-13 DIAGNOSIS — F17210 Nicotine dependence, cigarettes, uncomplicated: Secondary | ICD-10-CM

## 2015-10-13 DIAGNOSIS — G4730 Sleep apnea, unspecified: Secondary | ICD-10-CM

## 2015-10-13 DIAGNOSIS — D72819 Decreased white blood cell count, unspecified: Secondary | ICD-10-CM | POA: Diagnosis not present

## 2015-10-13 DIAGNOSIS — K449 Diaphragmatic hernia without obstruction or gangrene: Secondary | ICD-10-CM

## 2015-10-13 DIAGNOSIS — R1310 Dysphagia, unspecified: Secondary | ICD-10-CM

## 2015-10-13 DIAGNOSIS — I2510 Atherosclerotic heart disease of native coronary artery without angina pectoris: Secondary | ICD-10-CM

## 2015-10-13 DIAGNOSIS — K219 Gastro-esophageal reflux disease without esophagitis: Secondary | ICD-10-CM

## 2015-10-13 DIAGNOSIS — C155 Malignant neoplasm of lower third of esophagus: Secondary | ICD-10-CM

## 2015-10-13 DIAGNOSIS — Z7901 Long term (current) use of anticoagulants: Secondary | ICD-10-CM

## 2015-10-13 LAB — COMPREHENSIVE METABOLIC PANEL
ALBUMIN: 3.4 g/dL — AB (ref 3.5–5.0)
ALK PHOS: 94 U/L (ref 38–126)
ALT: 19 U/L (ref 17–63)
ANION GAP: 7 (ref 5–15)
AST: 24 U/L (ref 15–41)
BUN: 11 mg/dL (ref 6–20)
CALCIUM: 8.3 mg/dL — AB (ref 8.9–10.3)
CO2: 26 mmol/L (ref 22–32)
CREATININE: 0.94 mg/dL (ref 0.61–1.24)
Chloride: 103 mmol/L (ref 101–111)
GFR calc Af Amer: >60 mL/min (ref >60)
GFR calc non Af Amer: >60 mL/min (ref >60)
GLUCOSE: 164 mg/dL — AB (ref 65–99)
Potassium: 3 mmol/L — ABNORMAL LOW (ref 3.5–5.1)
SODIUM: 136 mmol/L (ref 135–145)
Total Bilirubin: 0.8 mg/dL (ref 0.3–1.2)
Total Protein: 5.9 g/dL — ABNORMAL LOW (ref 6.5–8.1)

## 2015-10-13 LAB — CBC WITH DIFFERENTIAL/PLATELET
BASOS PCT: 1 %
Basophils Absolute: 0 10*3/uL (ref 0–0.1)
Eosinophils Absolute: 0.2 10*3/uL (ref 0–0.7)
Eosinophils Relative: 5 %
HEMATOCRIT: 39.7 % — AB (ref 40.0–52.0)
Hemoglobin: 13.4 g/dL (ref 13.0–18.0)
Lymphocytes Relative: 28 %
Lymphs Abs: 0.8 10*3/uL — ABNORMAL LOW (ref 1.0–3.6)
MCH: 28.3 pg (ref 26.0–34.0)
MCHC: 33.8 g/dL (ref 32.0–36.0)
MCV: 83.7 fL (ref 80.0–100.0)
MONO ABS: 0.6 10*3/uL (ref 0.2–1.0)
MONOS PCT: 20 %
NEUTROS ABS: 1.4 10*3/uL (ref 1.4–6.5)
Neutrophils Relative %: 46 %
Platelets: 193 10*3/uL (ref 150–440)
RBC: 4.74 MIL/uL (ref 4.40–5.90)
RDW: 16.6 % — AB (ref 11.5–14.5)
WBC: 3 10*3/uL — ABNORMAL LOW (ref 3.8–10.6)

## 2015-10-13 MED ORDER — SODIUM CHLORIDE 0.9 % IV SOLN
Freq: Once | INTRAVENOUS | Status: AC
  Administered 2015-10-13: 14:00:00 via INTRAVENOUS
  Filled 2015-10-13: qty 250

## 2015-10-13 MED ORDER — PACLITAXEL CHEMO INJECTION 300 MG/50ML
80.0000 mg/m2 | Freq: Once | INTRAVENOUS | Status: AC
  Administered 2015-10-13: 174 mg via INTRAVENOUS
  Filled 2015-10-13: qty 29

## 2015-10-13 MED ORDER — DIPHENHYDRAMINE HCL 50 MG/ML IJ SOLN
25.0000 mg | Freq: Once | INTRAMUSCULAR | Status: AC
  Administered 2015-10-13: 25 mg via INTRAVENOUS
  Filled 2015-10-13: qty 1

## 2015-10-13 MED ORDER — SODIUM CHLORIDE 0.9 % IV SOLN: 40.0000 meq | Freq: Once | INTRAVENOUS | Status: DC

## 2015-10-13 MED ORDER — FAMOTIDINE IN NACL 20-0.9 MG/50ML-% IV SOLN
20.0000 mg | Freq: Once | INTRAVENOUS | Status: AC
Start: 2015-10-13 — End: 2015-10-13
  Administered 2015-10-13: 20 mg via INTRAVENOUS
  Filled 2015-10-13: qty 50

## 2015-10-13 MED ORDER — SODIUM CHLORIDE 0.9% FLUSH
10.0000 mL | INTRAVENOUS | Status: DC | PRN
  Administered 2015-10-13: 10 mL
  Filled 2015-10-13: qty 10

## 2015-10-13 MED ORDER — DEXAMETHASONE SODIUM PHOSPHATE 100 MG/10ML IJ SOLN
10.0000 mg | Freq: Once | INTRAMUSCULAR | Status: AC
  Administered 2015-10-13: 10 mg via INTRAVENOUS
  Filled 2015-10-13: qty 1

## 2015-10-13 MED ORDER — SODIUM CHLORIDE 0.9 % IV SOLN
Freq: Once | INTRAVENOUS | Status: AC
  Administered 2015-10-13: 12:00:00 via INTRAVENOUS
  Filled 2015-10-13: qty 1000

## 2015-10-13 MED ORDER — HEPARIN SOD (PORK) LOCK FLUSH 100 UNIT/ML IV SOLN
500.0000 [IU] | Freq: Once | INTRAVENOUS | Status: AC | PRN
  Administered 2015-10-13: 500 [IU]
  Filled 2015-10-13: qty 5

## 2015-10-13 NOTE — Progress Notes (Signed)
States has pain in left lower back due to "knot under skin." decreased appetite and fatigued today. Complains of acid reflux and hasn't been taking prilosec recently. Started taking prilosec yesterday and states that acid reflux is slightly improved today.

## 2015-10-13 NOTE — Progress Notes (Signed)
[place]  Telephone:(336) [phone] Fax:(336) [phone]  ID: [name] OB: 1940-09-22  MR#: [id number]  YDX#:[id number]  Patient Care Team: [name], MD as PCP - General (Geriatric Medicine) No Pcp Per Patient (General Practice) Manya Silvas, MD (Gastroenterology)  CHIEF COMPLAINT:  Chief Complaint  Patient presents with  . Esophageal Cancer    INTERVAL HISTORY: Patient returns to clinic today for further evaluation and consideration of cycle 2, day 1 of single agent Taxol. Patient had a reaction to Ramucirumab and this was discontinued. He states his dysphagia is improved, but still evident. He has a poor appetite. He feels his reflux is worse, but admits he has not taken his Prilosec as prescribed.  He has no neurologic complaints. He denies any recent fevers. He denies any shortness of breath or chest pain. He denies any nausea, constipation, or diarrhea. He has no melena or hematochezia. He has no urinary complaints. Patient offers no further specific complaints today.  REVIEW OF SYSTEMS:   Review of Systems  Constitutional: Positive for malaise/fatigue. Negative for fever and weight loss.  HENT:       Dysphasia.  Respiratory: Negative.  Negative for shortness of breath.   Cardiovascular: Negative for chest pain.  Gastrointestinal: Negative for nausea, abdominal pain, blood in stool and melena.  Musculoskeletal: Negative.   Neurological: Positive for weakness.  Endo/Heme/Allergies: Does not bruise/bleed easily.    As per HPI. Otherwise, a complete review of systems is negatve.   PAST MEDICAL HISTORY:  Hypertension, sleep apnea, atrial fibrillation.  PAST SURGICAL HISTORY: Negative.  FAMILY HISTORY: Reviewed and unchanged. No report of malignancy or chronic disease.     ADVANCED DIRECTIVES:    HEALTH MAINTENANCE: Social History  Substance Use Topics  . Smoking status: Current Some Day Smoker -- 62 years  . Smokeless tobacco: Current  User    Types: Snuff  . Alcohol Use: No     No Known Allergies  Current Outpatient Prescriptions  Medication Sig Dispense Refill  . albuterol (PROAIR HFA) 108 (90 Base) MCG/ACT inhaler Inhale into the lungs.    . digoxin (LANOXIN) 0.125 MG tablet Take by mouth.    . diphenoxylate-atropine (LOMOTIL) 2.5-0.025 MG tablet Take by mouth.    . enalapril (VASOTEC) 20 MG tablet Take by mouth.    . finasteride (PROSCAR) 5 MG tablet Take 5 mg by mouth daily.    . fluticasone (FLONASE) 50 MCG/ACT nasal spray ONE SPRAY IN EACH NOSTRIL AT NIGHT AS DIRECTED  11  . fluticasone (FLOVENT HFA) 220 MCG/ACT inhaler Inhale into the lungs.    . furosemide (LASIX) 20 MG tablet Take 20 mg by mouth.    . gabapentin (NEURONTIN) 300 MG capsule Take by mouth.    Marland Kitchen HYDROcodone-acetaminophen (NORCO) 10-325 MG tablet Take 1 tablet by mouth every 6 (six) hours as needed for severe pain. 20 tablet 0  . magic mouthwash SOLN Take 5-10 mLs by mouth 4 (four) times daily. 480 mL 2  . metoprolol (LOPRESSOR) 50 MG tablet Take 50 mg by mouth.    . nystatin (MYCOSTATIN) 100000 UNIT/ML suspension     . omeprazole (PRILOSEC) 20 MG capsule Take by mouth.    . prochlorperazine (COMPAZINE) 10 MG tablet Take 1 tablet (10 mg total) by mouth every 6 (six) hours as needed for nausea or vomiting. 30 tablet 1  . sucralfate (CARAFATE) 1 GM/10ML suspension Take 10 mLs (1 g total) by mouth 4 (four) times daily -  with meals and  at bedtime. 420 mL 1  . tamsulosin (FLOMAX) 0.4 MG CAPS capsule Take 0.4 mg by mouth daily.  3  . warfarin (COUMADIN) 4 MG tablet Take 2 tablets (8 mg total) by mouth every evening. 60 tablet 0   No current facility-administered medications for this visit.   Facility-Administered Medications Ordered in Other Visits  Medication Dose Route Frequency Provider Last Rate Last Dose  . heparin lock flush 100 unit/mL  500 Units Intracatheter Once PRN Lloyd Huger, MD      . PACLitaxel (TAXOL) 174 mg in dextrose 5 %  250 mL chemo infusion (</= 78m/m2)  80 mg/m2 (Treatment Plan Actual) Intravenous Once TLloyd Huger MD 279 mL/hr at 10/13/15 1244 174 mg at 10/13/15 1244  . sodium chloride 0.9 % 250 mL with potassium chloride 40 mEq infusion   Intravenous Once TLloyd Huger MD      . sodium chloride 0.9 % injection 10 mL  10 mL Intravenous PRN TLloyd Huger MD   10 mL at 12/23/14 0945  . sodium chloride 0.9 % injection 10 mL  10 mL Intravenous PRN TLloyd Huger MD   10 mL at 01/15/15 1512  . sodium chloride flush (NS) 0.9 % injection 10 mL  10 mL Intracatheter PRN TLloyd Huger MD   10 mL at 10/13/15 1029    OBJECTIVE: Filed Vitals:   10/13/15 1053  BP: 108/71  Pulse: 81  Temp: 98 F (36.7 C)  Resp: 18     Body mass index is 35.88 kg/(m^2).    ECOG FS:1 - Symptomatic but completely ambulatory  General: Well-developed, well-nourished, no acute distress. Eyes: anicteric sclera. Lungs: Clear to auscultation bilaterally. Heart: Regular rate and rhythm. No rubs, murmurs, or gallops. Abdomen: Soft, nontender, nondistended. No organomegaly noted, normoactive bowel sounds. Musculoskeletal: No edema, cyanosis, or clubbing. Neuro: Alert, answering all questions appropriately. Cranial nerves grossly intact. Skin: No rashes or petechiae noted. Psych: Normal affect.   LAB RESULTS:  Lab Results  Component Value Date   NA 136 10/13/2015   K 3.0* 10/13/2015   CL 103 10/13/2015   CO2 26 10/13/2015   GLUCOSE 164* 10/13/2015   BUN 11 10/13/2015   CREATININE 0.94 10/13/2015   CALCIUM 8.3* 10/13/2015   PROT 5.9* 10/13/2015   ALBUMIN 3.4* 10/13/2015   AST 24 10/13/2015   ALT 19 10/13/2015   ALKPHOS 94 10/13/2015   BILITOT 0.8 10/13/2015   GFRNONAA >60 10/13/2015   GFRAA >60 10/13/2015    Lab Results  Component Value Date   WBC 3.0* 10/13/2015   NEUTROABS 1.4 10/13/2015   HGB 13.4 10/13/2015   HCT 39.7* 10/13/2015   MCV 83.7 10/13/2015   PLT 193 10/13/2015      STUDIES: Nm Pet Image Restag (ps) Skull Base To Thigh  09/18/2015  CLINICAL DATA:  Subsequent treatment strategy for esophageal cancer. Last chemotherapy was 06/02/2015. Most recent EGD confirmed recurrence of malignancy in the esophagus. EXAM: NUCLEAR MEDICINE PET SKULL BASE TO THIGH TECHNIQUE: 12.9 mCi F-18 FDG was injected intravenously. Full-ring PET imaging was performed from the skull base to thigh after the radiotracer. CT data was obtained and used for attenuation correction and anatomic localization. FASTING BLOOD GLUCOSE:  Value: 99 mg/dl COMPARISON:  07/23/2014. FINDINGS: NECK No hypermetabolic lymph nodes in the neck. CHEST No hypermetabolic mediastinal or hilar nodes. No suspicious pulmonary nodules on the CT scan. Right PICC line tip is positioned at the junction of the SVC and RA. Coronary artery calcification  is evident. Marked hypermetabolism is now evident in the distal esophagus with SUV max = 13.6. No evidence for hypermetabolic lymph nodes in the mediastinum or either hilum. ABDOMEN/PELVIS No abnormal hypermetabolic activity within the liver, pancreas, adrenal glands, or spleen. FDG accumulation within the hepatic parenchyma is heterogeneous and mottled, but no definite hepatic metastases are evident. No hypermetabolic lymph nodes in the abdomen or pelvis. There is abdominal aortic atherosclerosis without aneurysm. 2.3 cm soft tissue attenuating nodule seen in the subcutaneous fat of the left flank region is not substantially changed in the interval. This remains hypermetabolic. As on the prior study, there is focal FDG accumulation in a left inguinal hernia containing only fat. SKELETON No focal hypermetabolic activity to suggest skeletal metastasis. IMPRESSION: 1. Interval development of marked hypermetabolism in the distal esophagus, consistent with recurrent neoplasm. 2. No evidence for hypermetabolic metastatic disease in the chest, abdomen, or pelvis. 3. Persistent hypermetabolism  within the left flank subcutaneous nodule, potentially a focal area of infection/inflammation or sebaceous cyst with infection/inflammation. Overall imaging features are stable. 4. Persistent hypermetabolic focus in a fat containing left inguinal hernia likely related to chronic inflammation. . Electronically Signed   By: Misty Stanley M.D.   On: 09/18/2015 12:40    ASSESSMENT: Recurrent adenocarcinoma of the esophagus, HER-2 negative.  PLAN:    1. Esophageal cancer:  Patient's most recent EGD confirmed recurrence of his malignancy. PET scan results reviewed independently and reported as above with only evidence of local recurrence. Patient is not a surgical candidate and no longer can receive XRT. Proceed with cycle 2, day 1 single agent Taxol today. Ramucirumab was discontinued secondary to reaction. Patient can only able to tolerate treatment on days 1 and 8 with single agent Taxol 80 mg/kg.  Reimage after 3 months in approximately September 2017. Return to clinic in 1 week for consideration of cycle 2, day 8. 2. Poor appetite: Improved, monitor. 3. Hypokalemia: Patient will receive 40 mEq IV potassium today. 4. Leukopenia: Mild, monitor. Secondary to chemotherapy.  Patient expressed understanding and was in agreement with this plan. He also understands that He can call clinic at any time with any questions, concerns, or complaints.    Lloyd Huger, MD   10/13/2015 1:34 PM

## 2015-10-21 ENCOUNTER — Inpatient Hospital Stay: Payer: Medicare Other

## 2015-10-21 ENCOUNTER — Inpatient Hospital Stay: Payer: Medicare Other | Attending: Oncology

## 2015-10-21 ENCOUNTER — Inpatient Hospital Stay (HOSPITAL_BASED_OUTPATIENT_CLINIC_OR_DEPARTMENT_OTHER): Payer: Medicare Other | Admitting: Oncology

## 2015-10-21 VITALS — BP 155/84 | HR 68 | Temp 96.1°F | Resp 18 | Wt 224.4 lb

## 2015-10-21 DIAGNOSIS — Z79899 Other long term (current) drug therapy: Secondary | ICD-10-CM | POA: Insufficient documentation

## 2015-10-21 DIAGNOSIS — I10 Essential (primary) hypertension: Secondary | ICD-10-CM

## 2015-10-21 DIAGNOSIS — D61810 Antineoplastic chemotherapy induced pancytopenia: Secondary | ICD-10-CM | POA: Diagnosis not present

## 2015-10-21 DIAGNOSIS — Z7901 Long term (current) use of anticoagulants: Secondary | ICD-10-CM | POA: Diagnosis not present

## 2015-10-21 DIAGNOSIS — G4730 Sleep apnea, unspecified: Secondary | ICD-10-CM | POA: Diagnosis not present

## 2015-10-21 DIAGNOSIS — F17210 Nicotine dependence, cigarettes, uncomplicated: Secondary | ICD-10-CM

## 2015-10-21 DIAGNOSIS — C155 Malignant neoplasm of lower third of esophagus: Secondary | ICD-10-CM | POA: Insufficient documentation

## 2015-10-21 DIAGNOSIS — I4891 Unspecified atrial fibrillation: Secondary | ICD-10-CM

## 2015-10-21 DIAGNOSIS — R630 Anorexia: Secondary | ICD-10-CM | POA: Insufficient documentation

## 2015-10-21 DIAGNOSIS — R1310 Dysphagia, unspecified: Secondary | ICD-10-CM

## 2015-10-21 DIAGNOSIS — Z5111 Encounter for antineoplastic chemotherapy: Secondary | ICD-10-CM | POA: Diagnosis not present

## 2015-10-21 DIAGNOSIS — C159 Malignant neoplasm of esophagus, unspecified: Secondary | ICD-10-CM

## 2015-10-21 LAB — CBC WITH DIFFERENTIAL/PLATELET
BASOS PCT: 1 %
Basophils Absolute: 0.1 10*3/uL (ref 0–0.1)
EOS ABS: 0.1 10*3/uL (ref 0–0.7)
EOS PCT: 2 %
HCT: 37.3 % — ABNORMAL LOW (ref 40.0–52.0)
Hemoglobin: 12.8 g/dL — ABNORMAL LOW (ref 13.0–18.0)
LYMPHS ABS: 1.2 10*3/uL (ref 1.0–3.6)
Lymphocytes Relative: 19 %
MCH: 28.7 pg (ref 26.0–34.0)
MCHC: 34.4 g/dL (ref 32.0–36.0)
MCV: 83.3 fL (ref 80.0–100.0)
MONO ABS: 0.6 10*3/uL (ref 0.2–1.0)
MONOS PCT: 10 %
NEUTROS PCT: 68 %
Neutro Abs: 4.2 10*3/uL (ref 1.4–6.5)
PLATELETS: 200 10*3/uL (ref 150–440)
RBC: 4.47 MIL/uL (ref 4.40–5.90)
RDW: 16.5 % — AB (ref 11.5–14.5)
WBC: 6.3 10*3/uL (ref 3.8–10.6)

## 2015-10-21 LAB — COMPREHENSIVE METABOLIC PANEL
ALBUMIN: 3.3 g/dL — AB (ref 3.5–5.0)
ALK PHOS: 83 U/L (ref 38–126)
ALT: 18 U/L (ref 17–63)
AST: 20 U/L (ref 15–41)
Anion gap: 3 — ABNORMAL LOW (ref 5–15)
BUN: 19 mg/dL (ref 6–20)
CALCIUM: 8.7 mg/dL — AB (ref 8.9–10.3)
CHLORIDE: 106 mmol/L (ref 101–111)
CO2: 29 mmol/L (ref 22–32)
CREATININE: 0.9 mg/dL (ref 0.61–1.24)
GFR calc Af Amer: >60 mL/min (ref >60)
GFR calc non Af Amer: >60 mL/min (ref >60)
GLUCOSE: 127 mg/dL — AB (ref 65–99)
Potassium: 3.6 mmol/L (ref 3.5–5.1)
SODIUM: 138 mmol/L (ref 135–145)
Total Bilirubin: 0.5 mg/dL (ref 0.3–1.2)
Total Protein: 6 g/dL — ABNORMAL LOW (ref 6.5–8.1)

## 2015-10-21 MED ORDER — FAMOTIDINE IN NACL 20-0.9 MG/50ML-% IV SOLN
20.0000 mg | Freq: Once | INTRAVENOUS | Status: AC
  Administered 2015-10-21: 20 mg via INTRAVENOUS
  Filled 2015-10-21: qty 50

## 2015-10-21 MED ORDER — DIPHENHYDRAMINE HCL 50 MG/ML IJ SOLN
25.0000 mg | Freq: Once | INTRAMUSCULAR | Status: AC
  Administered 2015-10-21: 25 mg via INTRAVENOUS
  Filled 2015-10-21: qty 1

## 2015-10-21 MED ORDER — PACLITAXEL CHEMO INJECTION 300 MG/50ML
80.0000 mg/m2 | Freq: Once | INTRAVENOUS | Status: AC
  Administered 2015-10-21: 174 mg via INTRAVENOUS
  Filled 2015-10-21: qty 29

## 2015-10-21 MED ORDER — HEPARIN SOD (PORK) LOCK FLUSH 100 UNIT/ML IV SOLN
500.0000 [IU] | Freq: Once | INTRAVENOUS | Status: AC | PRN
  Administered 2015-10-21: 500 [IU]
  Filled 2015-10-21: qty 5

## 2015-10-21 MED ORDER — DEXAMETHASONE SODIUM PHOSPHATE 100 MG/10ML IJ SOLN
10.0000 mg | Freq: Once | INTRAMUSCULAR | Status: AC
  Administered 2015-10-21: 10 mg via INTRAVENOUS
  Filled 2015-10-21: qty 1

## 2015-10-21 MED ORDER — SODIUM CHLORIDE 0.9 % IV SOLN
Freq: Once | INTRAVENOUS | Status: AC
  Administered 2015-10-21: 10:00:00 via INTRAVENOUS
  Filled 2015-10-21: qty 1000

## 2015-10-21 MED ORDER — SODIUM CHLORIDE 0.9% FLUSH
10.0000 mL | INTRAVENOUS | Status: DC | PRN
  Administered 2015-10-21: 10 mL via INTRAVENOUS
  Filled 2015-10-21: qty 10

## 2015-10-21 NOTE — Progress Notes (Signed)
Offers no complaints  

## 2015-10-21 NOTE — Progress Notes (Signed)
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  Chief Complaint  Patient presents with  . Esophageal Cancer    INTERVAL HISTORY: Patient returns to clinic today for further evaluation and consideration of cycle 2, day 8 of single agent Taxol. His dysphagia continues to improve, but still evident. He has a good appetite. He does not complain of reflux today. He has no neurologic complaints. He denies any recent fevers. He denies any shortness of breath or chest pain. He denies any nausea, vomiting, constipation, or diarrhea. He has no melena or hematochezia. He has no urinary complaints. Patient offers no further specific complaints today.  REVIEW OF SYSTEMS:   Review of Systems  Constitutional: Negative for fever, weight loss and malaise/fatigue.  HENT:       Dysphasia.  Respiratory: Negative.  Negative for shortness of breath.   Cardiovascular: Negative for chest pain.  Gastrointestinal: Negative for nausea, abdominal pain, blood in stool and melena.  Musculoskeletal: Negative.   Neurological: Negative for weakness.  Endo/Heme/Allergies: Does not bruise/bleed easily.    As per HPI. Otherwise, a complete review of systems is negatve.   PAST MEDICAL HISTORY:  Hypertension, sleep apnea, atrial fibrillation.  PAST SURGICAL HISTORY: Negative.  FAMILY HISTORY: Reviewed and unchanged. No report of malignancy or chronic disease.     ADVANCED DIRECTIVES:    HEALTH MAINTENANCE: Social History  Substance Use Topics  . Smoking status: Current Some Day Smoker -- 62 years  . Smokeless tobacco: Current User    Types: Snuff  . Alcohol Use: No      No Known Allergies  Current Outpatient Prescriptions  Medication Sig Dispense Refill  . albuterol (PROAIR HFA) 108 (90 Base) MCG/ACT inhaler Inhale into the lungs.    . digoxin (LANOXIN) 0.125 MG tablet Take by mouth.    . diphenoxylate-atropine (LOMOTIL) 2.5-0.025 MG tablet Take by mouth.    . enalapril (VASOTEC) 20 MG tablet Take by mouth.    . finasteride (PROSCAR) 5 MG tablet Take 5 mg by mouth daily.    . fluticasone (FLONASE) 50 MCG/ACT nasal spray ONE SPRAY IN EACH NOSTRIL AT NIGHT AS DIRECTED  11  . fluticasone (FLOVENT HFA) 220 MCG/ACT inhaler Inhale into the lungs.    . furosemide (LASIX) 20 MG tablet Take 20 mg by mouth.    . gabapentin (NEURONTIN) 300 MG capsule Take by mouth.    Marland Kitchen HYDROcodone-acetaminophen (NORCO) 10-325 MG tablet Take 1 tablet by mouth every 6 (six) hours as needed for severe pain. 20 tablet 0  . magic mouthwash SOLN Take 5-10 mLs by mouth 4 (four) times daily. 480 mL 2  . metoprolol (LOPRESSOR) 50 MG tablet Take 50 mg by mouth.    . nystatin (MYCOSTATIN) 100000 UNIT/ML suspension     . omeprazole (PRILOSEC) 20 MG capsule Take by mouth.    . prochlorperazine (COMPAZINE) 10 MG tablet Take 1 tablet (10 mg total) by mouth every 6 (six) hours as needed for nausea or vomiting. 30 tablet 1  . sucralfate (CARAFATE) 1 GM/10ML suspension Take 10 mLs (1 g total) by mouth 4 (four) times daily -  with meals and at bedtime. 420 mL 1  . tamsulosin (FLOMAX) 0.4 MG  CAPS capsule Take 0.4 mg by mouth daily.  3  . warfarin (COUMADIN) 4 MG tablet Take 2 tablets (8 mg total) by mouth every evening. 60 tablet 0   No current facility-administered medications for this visit.   Facility-Administered Medications Ordered in Other Visits  Medication Dose Route Frequency Provider Last Rate Last Dose  . heparin lock flush 100 unit/mL  500 Units Intracatheter Once PRN Lloyd Huger, MD      . PACLitaxel (TAXOL) 174 mg in dextrose 5 % 250 mL chemo infusion (</= 25m/m2)  80  mg/m2 (Treatment Plan Actual) Intravenous Once TLloyd Huger MD 279 mL/hr at 10/21/15 1051 174 mg at 10/21/15 1051  . sodium chloride 0.9 % injection 10 mL  10 mL Intravenous PRN TLloyd Huger MD   10 mL at 12/23/14 0945  . sodium chloride 0.9 % injection 10 mL  10 mL Intravenous PRN TLloyd Huger MD   10 mL at 01/15/15 1512  . sodium chloride flush (NS) 0.9 % injection 10 mL  10 mL Intravenous PRN TLloyd Huger MD   10 mL at 10/21/15 0844    OBJECTIVE: Filed Vitals:   10/21/15 0858  BP: 155/84  Pulse: 68  Temp: 96.1 F (35.6 C)  Resp: 18     Body mass index is 36.24 kg/(m^2).    ECOG FS:1 - Symptomatic but completely ambulatory  General: Well-developed, well-nourished, no acute distress. Eyes: anicteric sclera. Lungs: Clear to auscultation bilaterally. Heart: Regular rate and rhythm. No rubs, murmurs, or gallops. Abdomen: Soft, nontender, nondistended. No organomegaly noted, normoactive bowel sounds. Musculoskeletal: No edema, cyanosis, or clubbing. Neuro: Alert, answering all questions appropriately. Cranial nerves grossly intact. Skin: No rashes or petechiae noted. Psych: Normal affect.   LAB RESULTS:  Lab Results  Component Value Date   NA 138 10/21/2015   K 3.6 10/21/2015   CL 106 10/21/2015   CO2 29 10/21/2015   GLUCOSE 127* 10/21/2015   BUN 19 10/21/2015   CREATININE 0.90 10/21/2015   CALCIUM 8.7* 10/21/2015   PROT 6.0* 10/21/2015   ALBUMIN 3.3* 10/21/2015   AST 20 10/21/2015   ALT 18 10/21/2015   ALKPHOS 83 10/21/2015   BILITOT 0.5 10/21/2015   GFRNONAA >60 10/21/2015   GFRAA >60 10/21/2015    Lab Results  Component Value Date   WBC 6.3 10/21/2015   NEUTROABS 4.2 10/21/2015   HGB 12.8* 10/21/2015   HCT 37.3* 10/21/2015   MCV 83.3 10/21/2015   PLT 200 10/21/2015     STUDIES: No results found.  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  PLAN:    1. Recurrent adenocarcinoma of  the lower third of esophagus, HER-2 negative: :  Patient's most recent EGD confirmed recurrence of his malignancy. PET scan results reviewed independently and reported as above with only evidence of local recurrence. Patient is not a surgical candidate and no longer can receive XRT. Proceed with cycle 2, day 8 single agent Taxol today. Ramucirumab was discontinued secondary to reaction. Reimage after 3 months in approximately September 2017. Return to clinic in 1 week for consideration of cycle 2, day 15. 2. Poor appetite: Improved, monitor. 3. Hypokalemia: Resolved. Continue oral potassium supplementation. 4. Leukopenia: Resolved, monitor.   Patient expressed understanding and was in agreement with this plan. He also understands that He can call clinic at any time with any questions, concerns, or complaints.    TLloyd Huger MD   10/21/2015 11:13 AM

## 2015-10-27 ENCOUNTER — Inpatient Hospital Stay (HOSPITAL_BASED_OUTPATIENT_CLINIC_OR_DEPARTMENT_OTHER): Payer: Medicare Other | Admitting: Oncology

## 2015-10-27 ENCOUNTER — Inpatient Hospital Stay: Payer: Medicare Other

## 2015-10-27 VITALS — BP 114/76 | HR 82 | Temp 97.6°F | Resp 20 | Wt 224.0 lb

## 2015-10-27 DIAGNOSIS — I10 Essential (primary) hypertension: Secondary | ICD-10-CM

## 2015-10-27 DIAGNOSIS — R630 Anorexia: Secondary | ICD-10-CM

## 2015-10-27 DIAGNOSIS — D61810 Antineoplastic chemotherapy induced pancytopenia: Secondary | ICD-10-CM

## 2015-10-27 DIAGNOSIS — I4891 Unspecified atrial fibrillation: Secondary | ICD-10-CM

## 2015-10-27 DIAGNOSIS — R1310 Dysphagia, unspecified: Secondary | ICD-10-CM | POA: Diagnosis not present

## 2015-10-27 DIAGNOSIS — F17210 Nicotine dependence, cigarettes, uncomplicated: Secondary | ICD-10-CM

## 2015-10-27 DIAGNOSIS — C159 Malignant neoplasm of esophagus, unspecified: Secondary | ICD-10-CM

## 2015-10-27 DIAGNOSIS — C155 Malignant neoplasm of lower third of esophagus: Secondary | ICD-10-CM | POA: Diagnosis not present

## 2015-10-27 DIAGNOSIS — Z7901 Long term (current) use of anticoagulants: Secondary | ICD-10-CM

## 2015-10-27 DIAGNOSIS — G4730 Sleep apnea, unspecified: Secondary | ICD-10-CM

## 2015-10-27 DIAGNOSIS — Z79899 Other long term (current) drug therapy: Secondary | ICD-10-CM

## 2015-10-27 LAB — CBC WITH DIFFERENTIAL/PLATELET
BASOS ABS: 0 10*3/uL (ref 0–0.1)
BASOS PCT: 1 %
Eosinophils Absolute: 0.2 10*3/uL (ref 0–0.7)
Eosinophils Relative: 6 %
HEMATOCRIT: 35 % — AB (ref 40.0–52.0)
HEMOGLOBIN: 11.9 g/dL — AB (ref 13.0–18.0)
LYMPHS PCT: 25 %
Lymphs Abs: 0.7 10*3/uL — ABNORMAL LOW (ref 1.0–3.6)
MCH: 28.5 pg (ref 26.0–34.0)
MCHC: 34 g/dL (ref 32.0–36.0)
MCV: 84 fL (ref 80.0–100.0)
Monocytes Absolute: 0.2 10*3/uL (ref 0.2–1.0)
Monocytes Relative: 7 %
NEUTROS ABS: 1.6 10*3/uL (ref 1.4–6.5)
NEUTROS PCT: 61 %
Platelets: 149 10*3/uL — ABNORMAL LOW (ref 150–440)
RBC: 4.17 MIL/uL — AB (ref 4.40–5.90)
RDW: 17.4 % — ABNORMAL HIGH (ref 11.5–14.5)
WBC: 2.7 10*3/uL — AB (ref 3.8–10.6)

## 2015-10-27 LAB — COMPREHENSIVE METABOLIC PANEL
ALBUMIN: 3.1 g/dL — AB (ref 3.5–5.0)
ALT: 17 U/L (ref 17–63)
ANION GAP: 4 — AB (ref 5–15)
AST: 23 U/L (ref 15–41)
Alkaline Phosphatase: 81 U/L (ref 38–126)
BILIRUBIN TOTAL: 0.8 mg/dL (ref 0.3–1.2)
BUN: 17 mg/dL (ref 6–20)
CO2: 27 mmol/L (ref 22–32)
Calcium: 8.5 mg/dL — ABNORMAL LOW (ref 8.9–10.3)
Chloride: 106 mmol/L (ref 101–111)
Creatinine, Ser: 0.8 mg/dL (ref 0.61–1.24)
GLUCOSE: 181 mg/dL — AB (ref 65–99)
POTASSIUM: 3.5 mmol/L (ref 3.5–5.1)
Sodium: 137 mmol/L (ref 135–145)
TOTAL PROTEIN: 5.5 g/dL — AB (ref 6.5–8.1)

## 2015-10-27 MED ORDER — HEPARIN SOD (PORK) LOCK FLUSH 100 UNIT/ML IV SOLN
500.0000 [IU] | Freq: Once | INTRAVENOUS | Status: AC | PRN
  Administered 2015-10-27: 500 [IU]
  Filled 2015-10-27: qty 5

## 2015-10-27 MED ORDER — FAMOTIDINE IN NACL 20-0.9 MG/50ML-% IV SOLN
20.0000 mg | Freq: Once | INTRAVENOUS | Status: AC
  Administered 2015-10-27: 20 mg via INTRAVENOUS
  Filled 2015-10-27: qty 50

## 2015-10-27 MED ORDER — DEXAMETHASONE SODIUM PHOSPHATE 100 MG/10ML IJ SOLN
10.0000 mg | Freq: Once | INTRAMUSCULAR | Status: AC
  Administered 2015-10-27: 10 mg via INTRAVENOUS
  Filled 2015-10-27: qty 1

## 2015-10-27 MED ORDER — SODIUM CHLORIDE 0.9 % IV SOLN
Freq: Once | INTRAVENOUS | Status: AC
  Administered 2015-10-27: 12:00:00 via INTRAVENOUS
  Filled 2015-10-27: qty 1000

## 2015-10-27 MED ORDER — PACLITAXEL CHEMO INJECTION 300 MG/50ML
80.0000 mg/m2 | Freq: Once | INTRAVENOUS | Status: AC
  Administered 2015-10-27: 174 mg via INTRAVENOUS
  Filled 2015-10-27: qty 29

## 2015-10-27 MED ORDER — SODIUM CHLORIDE 0.9% FLUSH
10.0000 mL | INTRAVENOUS | Status: DC | PRN
  Administered 2015-10-27: 10 mL
  Filled 2015-10-27: qty 10

## 2015-10-27 MED ORDER — DIPHENHYDRAMINE HCL 50 MG/ML IJ SOLN
25.0000 mg | Freq: Once | INTRAMUSCULAR | Status: AC
  Administered 2015-10-27: 25 mg via INTRAVENOUS
  Filled 2015-10-27: qty 1

## 2015-10-27 NOTE — Progress Notes (Signed)
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   INTERVAL HISTORY: Patient returns to clinic today for further evaluation and consideration of cycle 2, day 15 of single agent Taxol. His dysphagia continues to improve. He has a good appetite. He does not complain of reflux today. He has no neurologic complaints. He denies any recent fevers. He denies any shortness of breath or chest pain. He denies any nausea, vomiting, constipation, or diarrhea. He has no melena or hematochezia. He has no urinary complaints. Patient offers no further specific complaints today.  REVIEW OF SYSTEMS:   Review of Systems  Constitutional: Negative for fever, weight loss and malaise/fatigue.  HENT:       Dysphasia.  Respiratory: Negative.  Negative for shortness of breath.   Cardiovascular: Negative for chest pain.  Gastrointestinal: Negative for nausea, abdominal pain, blood in stool and melena.  Musculoskeletal: Negative.   Neurological: Negative for weakness.  Endo/Heme/Allergies: Does not bruise/bleed easily.    As per HPI. Otherwise, a complete review of systems is negatve.   PAST MEDICAL HISTORY:  Hypertension, sleep apnea, atrial fibrillation.  PAST SURGICAL HISTORY: Negative.  FAMILY HISTORY: Reviewed and unchanged. No report of malignancy or chronic disease.     ADVANCED DIRECTIVES:    HEALTH MAINTENANCE: Social History  Substance Use Topics  . Smoking status: Current Some Day Smoker -- 62 years  . Smokeless tobacco: Current User    Types: Snuff  . Alcohol Use: No     No Known Allergies  Current Outpatient Prescriptions  Medication Sig  Dispense Refill  . albuterol (PROAIR HFA) 108 (90 Base) MCG/ACT inhaler Inhale into the lungs.    . digoxin (LANOXIN) 0.125 MG tablet Take by mouth.    . diphenoxylate-atropine (LOMOTIL) 2.5-0.025 MG tablet Take by mouth.    . enalapril (VASOTEC) 20 MG tablet Take by mouth.    . finasteride (PROSCAR) 5 MG tablet Take 5 mg by mouth daily.    . fluticasone (FLONASE) 50 MCG/ACT nasal spray ONE SPRAY IN EACH NOSTRIL AT NIGHT AS DIRECTED  11  . fluticasone (FLOVENT HFA) 220 MCG/ACT inhaler Inhale into the lungs.    . furosemide (LASIX) 20 MG tablet Take 20 mg by mouth.    . gabapentin (NEURONTIN) 300 MG capsule Take by mouth.    Marland Kitchen HYDROcodone-acetaminophen (NORCO) 10-325 MG tablet Take 1 tablet by mouth every 6 (six) hours as needed for severe pain. 20 tablet 0  . magic mouthwash SOLN Take 5-10 mLs by mouth 4 (four) times daily. 480 mL 2  . metoprolol (LOPRESSOR) 50 MG tablet Take 50 mg by mouth.    . nystatin (MYCOSTATIN) 100000 UNIT/ML suspension     . omeprazole (PRILOSEC) 20 MG capsule Take by mouth.    . prochlorperazine (COMPAZINE) 10 MG tablet Take 1 tablet (10 mg total) by mouth every 6 (six) hours as needed for nausea or vomiting. 30 tablet 1  . sucralfate (CARAFATE) 1 GM/10ML suspension Take 10 mLs (1 g total) by mouth 4 (four) times daily -  with meals and at bedtime. 420 mL 1  . tamsulosin (FLOMAX) 0.4 MG CAPS capsule Take 0.4 mg by mouth daily.  3  . warfarin (COUMADIN) 4  MG tablet Take 2 tablets (8 mg total) by mouth every evening. 60 tablet 0   No current facility-administered medications for this visit.   Facility-Administered Medications Ordered in Other Visits  Medication Dose Route Frequency Provider Last Rate Last Dose  . sodium chloride 0.9 % injection 10 mL  10 mL Intravenous PRN Lloyd Huger, MD   10 mL at 12/23/14 0945  . sodium chloride 0.9 % injection 10 mL  10 mL Intravenous PRN Lloyd Huger, MD   10 mL at 01/15/15 1512    OBJECTIVE: Filed Vitals:    10/27/15 1033  BP: 114/76  Pulse: 82  Temp: 97.6 F (36.4 C)  Resp: 20     Body mass index is 36.17 kg/(m^2).    ECOG FS:1 - Symptomatic but completely ambulatory  General: Well-developed, well-nourished, no acute distress. Eyes: anicteric sclera. Lungs: Clear to auscultation bilaterally. Heart: Regular rate and rhythm. No rubs, murmurs, or gallops. Abdomen: Soft, nontender, nondistended. No organomegaly noted, normoactive bowel sounds. Musculoskeletal: No edema, cyanosis, or clubbing. Neuro: Alert, answering all questions appropriately. Cranial nerves grossly intact. Skin: No rashes or petechiae noted. Psych: Normal affect.   LAB RESULTS:  Lab Results  Component Value Date   NA 137 10/27/2015   K 3.5 10/27/2015   CL 106 10/27/2015   CO2 27 10/27/2015   GLUCOSE 181* 10/27/2015   BUN 17 10/27/2015   CREATININE 0.80 10/27/2015   CALCIUM 8.5* 10/27/2015   PROT 5.5* 10/27/2015   ALBUMIN 3.1* 10/27/2015   AST 23 10/27/2015   ALT 17 10/27/2015   ALKPHOS 81 10/27/2015   BILITOT 0.8 10/27/2015   GFRNONAA >60 10/27/2015   GFRAA >60 10/27/2015    Lab Results  Component Value Date   WBC 2.7* 10/27/2015   NEUTROABS 1.6 10/27/2015   HGB 11.9* 10/27/2015   HCT 35.0* 10/27/2015   MCV 84.0 10/27/2015   PLT 149* 10/27/2015     STUDIES: No results found.  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  PLAN:    1. 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 PET scan results reviewed independently with only evidence of local recurrence. Patient is not a surgical candidate and no longer can receive XRT. Proceed with cycle 2, day 15 single agent Taxol today. Ramucirumab was discontinued secondary to reaction. Reimage after 3 months in approximately September 2017. Return to clinic in 2 weeks for consideration of cycle 3, day 1. 2. Poor appetite:  Improved, monitor. 3. Hypokalemia: Resolved. Continue oral potassium supplementation. 4. 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  Patient expressed understanding and was in agreement with this plan. He also understands that He can call clinic at any time with any questions, concerns, or complaints.    Lloyd Huger, MD   10/27/2015 5:08 PM

## 2015-10-27 NOTE — Progress Notes (Signed)
Patient here today for ongoing follow up and treatment consideration regarding esophageal cancer. Patient reports weakness and difficulty tolerating some foods.

## 2015-11-10 ENCOUNTER — Inpatient Hospital Stay: Payer: Medicare Other

## 2015-11-10 ENCOUNTER — Inpatient Hospital Stay (HOSPITAL_BASED_OUTPATIENT_CLINIC_OR_DEPARTMENT_OTHER): Payer: Medicare Other | Admitting: Oncology

## 2015-11-10 VITALS — BP 116/71 | HR 76 | Temp 97.1°F | Wt 228.0 lb

## 2015-11-10 DIAGNOSIS — I10 Essential (primary) hypertension: Secondary | ICD-10-CM

## 2015-11-10 DIAGNOSIS — I4891 Unspecified atrial fibrillation: Secondary | ICD-10-CM

## 2015-11-10 DIAGNOSIS — F17210 Nicotine dependence, cigarettes, uncomplicated: Secondary | ICD-10-CM

## 2015-11-10 DIAGNOSIS — R630 Anorexia: Secondary | ICD-10-CM

## 2015-11-10 DIAGNOSIS — Z7901 Long term (current) use of anticoagulants: Secondary | ICD-10-CM

## 2015-11-10 DIAGNOSIS — Z79899 Other long term (current) drug therapy: Secondary | ICD-10-CM

## 2015-11-10 DIAGNOSIS — C155 Malignant neoplasm of lower third of esophagus: Secondary | ICD-10-CM

## 2015-11-10 DIAGNOSIS — C159 Malignant neoplasm of esophagus, unspecified: Secondary | ICD-10-CM

## 2015-11-10 DIAGNOSIS — R1310 Dysphagia, unspecified: Secondary | ICD-10-CM

## 2015-11-10 DIAGNOSIS — G4730 Sleep apnea, unspecified: Secondary | ICD-10-CM

## 2015-11-10 LAB — CBC WITH DIFFERENTIAL/PLATELET
Basophils Absolute: 0.1 10*3/uL (ref 0–0.1)
Basophils Relative: 2 %
Eosinophils Absolute: 0.2 10*3/uL (ref 0–0.7)
Eosinophils Relative: 5 %
HEMATOCRIT: 34 % — AB (ref 40.0–52.0)
HEMOGLOBIN: 11.6 g/dL — AB (ref 13.0–18.0)
LYMPHS ABS: 0.9 10*3/uL — AB (ref 1.0–3.6)
LYMPHS PCT: 19 %
MCH: 28.4 pg (ref 26.0–34.0)
MCHC: 34.1 g/dL (ref 32.0–36.0)
MCV: 83.5 fL (ref 80.0–100.0)
MONOS PCT: 15 %
Monocytes Absolute: 0.7 10*3/uL (ref 0.2–1.0)
NEUTROS ABS: 3 10*3/uL (ref 1.4–6.5)
NEUTROS PCT: 59 %
Platelets: 210 10*3/uL (ref 150–440)
RBC: 4.08 MIL/uL — AB (ref 4.40–5.90)
RDW: 18 % — ABNORMAL HIGH (ref 11.5–14.5)
WBC: 4.9 10*3/uL (ref 3.8–10.6)

## 2015-11-10 LAB — COMPREHENSIVE METABOLIC PANEL WITH GFR
ALT: 14 U/L — ABNORMAL LOW (ref 17–63)
AST: 26 U/L (ref 15–41)
Albumin: 3.1 g/dL — ABNORMAL LOW (ref 3.5–5.0)
Alkaline Phosphatase: 100 U/L (ref 38–126)
Anion gap: 8 (ref 5–15)
BUN: 13 mg/dL (ref 6–20)
CO2: 26 mmol/L (ref 22–32)
Calcium: 8.3 mg/dL — ABNORMAL LOW (ref 8.9–10.3)
Chloride: 99 mmol/L — ABNORMAL LOW (ref 101–111)
Creatinine, Ser: 0.99 mg/dL (ref 0.61–1.24)
GFR calc Af Amer: >60 mL/min
GFR calc non Af Amer: >60 mL/min
Glucose, Bld: 164 mg/dL — ABNORMAL HIGH (ref 65–99)
Potassium: 3.7 mmol/L (ref 3.5–5.1)
Sodium: 133 mmol/L — ABNORMAL LOW (ref 135–145)
Total Bilirubin: 0.5 mg/dL (ref 0.3–1.2)
Total Protein: 5.8 g/dL — ABNORMAL LOW (ref 6.5–8.1)

## 2015-11-10 MED ORDER — SODIUM CHLORIDE 0.9 % IV SOLN
Freq: Once | INTRAVENOUS | Status: AC
  Administered 2015-11-10: 11:00:00 via INTRAVENOUS
  Filled 2015-11-10: qty 1000

## 2015-11-10 MED ORDER — DIPHENHYDRAMINE HCL 50 MG/ML IJ SOLN
25.0000 mg | Freq: Once | INTRAMUSCULAR | Status: AC
  Administered 2015-11-10: 25 mg via INTRAVENOUS
  Filled 2015-11-10: qty 1

## 2015-11-10 MED ORDER — SODIUM CHLORIDE 0.9% FLUSH
10.0000 mL | INTRAVENOUS | Status: AC | PRN
  Administered 2015-11-10: 10 mL via INTRAVENOUS
  Filled 2015-11-10: qty 10

## 2015-11-10 MED ORDER — PACLITAXEL CHEMO INJECTION 300 MG/50ML
80.0000 mg/m2 | Freq: Once | INTRAVENOUS | Status: AC
  Administered 2015-11-10: 174 mg via INTRAVENOUS
  Filled 2015-11-10: qty 29

## 2015-11-10 MED ORDER — HEPARIN SOD (PORK) LOCK FLUSH 100 UNIT/ML IV SOLN
500.0000 [IU] | Freq: Once | INTRAVENOUS | Status: AC
  Administered 2015-11-10: 500 [IU] via INTRAVENOUS
  Filled 2015-11-10: qty 5

## 2015-11-10 MED ORDER — SODIUM CHLORIDE 0.9 % IV SOLN
10.0000 mg | Freq: Once | INTRAVENOUS | Status: AC
  Administered 2015-11-10: 10 mg via INTRAVENOUS
  Filled 2015-11-10: qty 1

## 2015-11-10 MED ORDER — FAMOTIDINE IN NACL 20-0.9 MG/50ML-% IV SOLN
20.0000 mg | Freq: Once | INTRAVENOUS | Status: AC
  Administered 2015-11-10: 20 mg via INTRAVENOUS
  Filled 2015-11-10: qty 50

## 2015-11-12 NOTE — Progress Notes (Signed)
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   INTERVAL HISTORY: Patient returns to clinic today for further evaluation and consideration of cycle 3, day 1 of single agent Taxol. Patient does not complain of dysphagia or difficulty swallowing today and has a good appetite. He does not complain of reflux today. He has no neurologic complaints. He denies any recent fevers. He denies any shortness of breath or chest pain. He denies any nausea, vomiting, constipation, or diarrhea. He has no melena or hematochezia. He has no urinary complaints. Patient offers no specific complaints today.  REVIEW OF SYSTEMS:   Review of Systems  Constitutional: Negative for fever, malaise/fatigue and weight loss.  Respiratory: Negative.  Negative for shortness of breath.   Cardiovascular: Negative for chest pain.  Gastrointestinal: Negative for abdominal pain, blood in stool, melena and nausea.  Musculoskeletal: Negative.   Neurological: Negative for weakness.  Endo/Heme/Allergies: Does not bruise/bleed easily.    As per HPI. Otherwise, a complete review of systems is negatve.   PAST MEDICAL HISTORY:  Hypertension, sleep apnea, atrial fibrillation.  PAST SURGICAL HISTORY: Negative.  FAMILY HISTORY: Reviewed and unchanged. No report of malignancy or chronic disease.     ADVANCED DIRECTIVES:    HEALTH MAINTENANCE: Social History  Substance Use Topics  . Smoking status: Current Some Day Smoker    Years: 62.00  . Smokeless tobacco: Current User    Types: Snuff  . Alcohol use No     No Known Allergies  Current Outpatient Prescriptions  Medication Sig  Dispense Refill  . albuterol (PROVENTIL HFA;VENTOLIN HFA) 108 (90 Base) MCG/ACT inhaler Inhale into the lungs.    Marland Kitchen diltiazem (DILACOR XR) 120 MG 24 hr capsule Take 120 mg by mouth.    . fluticasone (FLONASE) 50 MCG/ACT nasal spray 1 spray by Each Nare route nightly. Frequency:QD   Dosage:50   MCG  Instructions:  Note:Dose: 20 MCG    . furosemide (LASIX) 20 MG tablet Take 20 mg by mouth.    . gabapentin (NEURONTIN) 300 MG capsule Take 300 mg by mouth.    Marland Kitchen HYDROcodone-acetaminophen (NORCO) 10-325 MG tablet Take by mouth.    . metoprolol (LOPRESSOR) 50 MG tablet Take 50 mg by mouth.    Marland Kitchen omeprazole (PRILOSEC) 20 MG capsule Take 20 mg by mouth.    . warfarin (COUMADIN) 4 MG tablet Take 2 tablets by mouth with evening meal    . cefUROXime (CEFTIN) 500 MG tablet Take 500 mg by mouth 2 (two) times daily.  0  . chlorpheniramine-HYDROcodone (TUSSIONEX) 10-8 MG/5ML SUER TAKE 5ML BY MOUTH EVERY TWELVE HOURS AS NEEDED FOR COUGH  0  . digoxin (LANOXIN) 0.125 MG tablet Take by mouth.    . diltiazem (CARDIZEM CD) 120 MG 24 hr capsule     . diphenoxylate-atropine (LOMOTIL) 2.5-0.025 MG tablet Take by mouth.    . enalapril (VASOTEC) 20 MG tablet Take by mouth.    . finasteride (PROSCAR) 5 MG tablet Take 5 mg by mouth daily.    . fluticasone (FLONASE) 50 MCG/ACT nasal spray ONE SPRAY IN EACH NOSTRIL AT NIGHT AS DIRECTED  11  . fluticasone (FLOVENT HFA) 220 MCG/ACT inhaler Inhale into the lungs.    Marland Kitchen  magic mouthwash SOLN Take 5-10 mLs by mouth 4 (four) times daily. 480 mL 2  . nystatin (MYCOSTATIN) 100000 UNIT/ML suspension     . prochlorperazine (COMPAZINE) 10 MG tablet Take 10 mg by mouth.    . sucralfate (CARAFATE) 1 GM/10ML suspension Take 10 mLs (1 g total) by mouth 4 (four) times daily -  with meals and at bedtime. 420 mL 1  . tamsulosin (FLOMAX) 0.4 MG CAPS capsule Take 0.4 mg by mouth daily.  3   No current facility-administered medications for this visit.    Facility-Administered Medications  Ordered in Other Visits  Medication Dose Route Frequency Provider Last Rate Last Dose  . sodium chloride 0.9 % injection 10 mL  10 mL Intravenous PRN Lloyd Huger, MD   10 mL at 12/23/14 0945  . sodium chloride 0.9 % injection 10 mL  10 mL Intravenous PRN Lloyd Huger, MD   10 mL at 01/15/15 1512  . sodium chloride flush (NS) 0.9 % injection 10 mL  10 mL Intravenous PRN Lloyd Huger, MD   10 mL at 11/10/15 0939    OBJECTIVE: Vitals:   11/10/15 1011  BP: 116/71  Pulse: 76  Temp: 97.1 F (36.2 C)     Body mass index is 36.79 kg/m.    ECOG FS:1 - Symptomatic but completely ambulatory  General: Well-developed, well-nourished, no acute distress. Eyes: anicteric sclera. Lungs: Clear to auscultation bilaterally. Heart: Regular rate and rhythm. No rubs, murmurs, or gallops. Abdomen: Soft, nontender, nondistended. No organomegaly noted, normoactive bowel sounds. Musculoskeletal: No edema, cyanosis, or clubbing. Neuro: Alert, answering all questions appropriately. Cranial nerves grossly intact. Skin: No rashes or petechiae noted. Psych: Normal affect.   LAB RESULTS:  Lab Results  Component Value Date   NA 133 (L) 11/10/2015   K 3.7 11/10/2015   CL 99 (L) 11/10/2015   CO2 26 11/10/2015   GLUCOSE 164 (H) 11/10/2015   BUN 13 11/10/2015   CREATININE 0.99 11/10/2015   CALCIUM 8.3 (L) 11/10/2015   PROT 5.8 (L) 11/10/2015   ALBUMIN 3.1 (L) 11/10/2015   AST 26 11/10/2015   ALT 14 (L) 11/10/2015   ALKPHOS 100 11/10/2015   BILITOT 0.5 11/10/2015   GFRNONAA >60 11/10/2015   GFRAA >60 11/10/2015    Lab Results  Component Value Date   WBC 4.9 11/10/2015   NEUTROABS 3.0 11/10/2015   HGB 11.6 (L) 11/10/2015   HCT 34.0 (L) 11/10/2015   MCV 83.5 11/10/2015   PLT 210 11/10/2015     STUDIES: No results found.  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  PLAN:    1. Recurrent adenocarcinoma of the lower third of  esophagus, HER-2 negative: Patient's most recent EGD confirmed recurrence of his malignancy. PET scan results reviewed independently with only evidence of local recurrence. Patient is not a surgical candidate and no longer can receive XRT. Proceed with cycle 3, day 1 single agent Taxol today. Ramucirumab was discontinued secondary to reaction. Reimage after 3 months in approximately September 2017. Return to clinic in 1 week for consideration of cycle 3, day 8. 2. Poor appetite: Improved, monitor. 3. Hypokalemia: Resolved. Continue oral potassium supplementation. 4. Pancytopenia: Resolved, monitor.  Patient expressed understanding and was in agreement with this plan. He also understands that He can call clinic at any time with any questions, concerns, or complaints.    Lloyd Huger, MD   11/12/2015 10:52 AM

## 2015-11-13 ENCOUNTER — Other Ambulatory Visit: Payer: Self-pay | Admitting: Oncology

## 2015-11-13 DIAGNOSIS — C155 Malignant neoplasm of lower third of esophagus: Secondary | ICD-10-CM

## 2015-11-13 MED ORDER — PROCHLORPERAZINE MALEATE 10 MG PO TABS: 10.0000 mg | ORAL_TABLET | Freq: Four times a day (QID) | ORAL | 5 refills | Status: AC | PRN

## 2015-11-16 NOTE — Progress Notes (Signed)
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  INTERVAL HISTORY: Patient returns to clinic today for further evaluation and consideration of cycle 3, day 8 of single agent Taxol. Patient does not complain of dysphagia or difficulty swallowing today and has a good appetite. He does not complain of reflux today. He did note some increased weakness and fatigue since his last treatment. He also had several days of nausea, but this has resolved. He has no neurologic complaints. He denies any recent fevers. He denies any shortness of breath or chest pain. He denies any vomiting, constipation, or diarrhea. He has no melena or hematochezia. He has no urinary complaints. Patient offers no further specific complaints today.  REVIEW OF SYSTEMS:   Review of Systems  Constitutional: Positive for malaise/fatigue. Negative for fever and weight loss.  Respiratory: Negative.  Negative for shortness of breath.   Cardiovascular: Negative for chest pain.  Gastrointestinal: Positive for nausea. Negative for abdominal pain, blood in stool and melena.  Musculoskeletal: Negative.   Neurological: Positive for weakness.  Endo/Heme/Allergies: Does not bruise/bleed easily.    As per HPI. Otherwise, a complete review of systems is negatve.   PAST MEDICAL HISTORY:  Hypertension, sleep apnea, atrial fibrillation.  PAST SURGICAL HISTORY: Negative.  FAMILY HISTORY: Reviewed and unchanged. No report of malignancy or chronic disease.     ADVANCED DIRECTIVES:    HEALTH MAINTENANCE: Social History  Substance Use Topics  . Smoking status: Current Some Day Smoker    Years:  62.00  . Smokeless tobacco: Current User    Types: Snuff  . Alcohol use No     No Known Allergies  Current Outpatient Prescriptions  Medication Sig Dispense Refill  . albuterol (PROVENTIL HFA;VENTOLIN HFA) 108 (90 Base) MCG/ACT inhaler Inhale into the lungs.    . chlorpheniramine-HYDROcodone (TUSSIONEX) 10-8 MG/5ML SUER TAKE 5ML BY MOUTH EVERY TWELVE HOURS AS NEEDED FOR COUGH  0  . digoxin (LANOXIN) 0.125 MG tablet Take by mouth.    . diltiazem (CARDIZEM CD) 120 MG 24 hr capsule     . diphenoxylate-atropine (LOMOTIL) 2.5-0.025 MG tablet Take by mouth.    . enalapril (VASOTEC) 20 MG tablet Take by mouth.    . finasteride (PROSCAR) 5 MG tablet Take 5 mg by mouth daily.    . fluticasone (FLONASE) 50 MCG/ACT nasal spray ONE SPRAY IN EACH NOSTRIL AT NIGHT AS DIRECTED  11  . fluticasone (FLONASE) 50 MCG/ACT nasal spray 1 spray by Each Nare route nightly. Frequency:QD   Dosage:50   MCG  Instructions:  Note:Dose: 77 MCG    . fluticasone (FLOVENT HFA) 220 MCG/ACT inhaler Inhale into the lungs.    . furosemide (LASIX) 20 MG tablet Take 20 mg by mouth.    . gabapentin (NEURONTIN) 300 MG capsule Take 300 mg by mouth.    Marland Kitchen HYDROcodone-acetaminophen (NORCO) 10-325 MG tablet Take by mouth.    . magic mouthwash SOLN Take 5-10 mLs by mouth 4 (four) times daily. 480 mL 2  . metoprolol (LOPRESSOR) 50 MG tablet Take 50 mg by mouth.    . nystatin (MYCOSTATIN) 100000 UNIT/ML suspension     . omeprazole (PRILOSEC) 20 MG capsule Take 20 mg by mouth.    Marland Kitchen  prochlorperazine (COMPAZINE) 10 MG tablet Take 1 tablet (10 mg total) by mouth every 6 (six) hours as needed for nausea or vomiting. 90 tablet 5  . sucralfate (CARAFATE) 1 GM/10ML suspension Take 10 mLs (1 g total) by mouth 4 (four) times daily -  with meals and at bedtime. 420 mL 1  . tamsulosin (FLOMAX) 0.4 MG CAPS capsule Take 0.4 mg by mouth daily.  3  . warfarin (COUMADIN) 4 MG tablet Take 2 tablets by mouth with evening meal    . diltiazem (DILACOR XR)  120 MG 24 hr capsule Take 120 mg by mouth.     No current facility-administered medications for this visit.    Facility-Administered Medications Ordered in Other Visits  Medication Dose Route Frequency Provider Last Rate Last Dose  . sodium chloride 0.9 % injection 10 mL  10 mL Intravenous PRN Lloyd Huger, MD   10 mL at 12/23/14 0945  . sodium chloride 0.9 % injection 10 mL  10 mL Intravenous PRN Lloyd Huger, MD   10 mL at 01/15/15 1512  . sodium chloride flush (NS) 0.9 % injection 10 mL  10 mL Intravenous PRN Lloyd Huger, MD   10 mL at 11/10/15 0939    OBJECTIVE: Vitals:   11/17/15 1038  BP: 135/78  Pulse: 62  Resp: (!) 22  Temp: (!) 94.7 F (34.8 C)     Body mass index is 36.53 kg/m.    ECOG FS:1 - Symptomatic but completely ambulatory  General: Well-developed, well-nourished, no acute distress. Eyes: anicteric sclera. Lungs: Clear to auscultation bilaterally. Heart: Regular rate and rhythm. No rubs, murmurs, or gallops. Abdomen: Soft, nontender, nondistended. No organomegaly noted, normoactive bowel sounds. Musculoskeletal: No edema, cyanosis, or clubbing. Neuro: Alert, answering all questions appropriately. Cranial nerves grossly intact. Skin: No rashes or petechiae noted. Psych: Normal affect.   LAB RESULTS:  Lab Results  Component Value Date   NA 135 11/17/2015   K 3.6 11/17/2015   CL 104 11/17/2015   CO2 27 11/17/2015   GLUCOSE 89 11/17/2015   BUN 16 11/17/2015   CREATININE 0.77 11/17/2015   CALCIUM 8.4 (L) 11/17/2015   PROT 5.9 (L) 11/17/2015   ALBUMIN 3.3 (L) 11/17/2015   AST 16 11/17/2015   ALT 13 (L) 11/17/2015   ALKPHOS 92 11/17/2015   BILITOT 0.6 11/17/2015   GFRNONAA >60 11/17/2015   GFRAA >60 11/17/2015    Lab Results  Component Value Date   WBC 6.4 11/17/2015   NEUTROABS 4.5 11/17/2015   HGB 11.2 (L) 11/17/2015   HCT 32.6 (L) 11/17/2015   MCV 83.2 11/17/2015   PLT 220 11/17/2015     STUDIES: No results  found.  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  PLAN:    1. 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 PET scan results reviewed independently with only evidence of local recurrence. Patient is not a surgical candidate and no longer can receive XRT. Proceed with cycle 3, day 8 single agent Taxol today. Ramucirumab was discontinued secondary to reaction. Reimage after 3 months in approximately September 2017. Return to clinic in 2 weeks for consideration of cycle 4, day 1. 2. Poor appetite: Improved, monitor. 3. Hypokalemia: Resolved. Continue oral potassium supplementation. 4. Anemia: Mild, monitor. 5. Nausea: Patient was given a prescription for Compazine.  Patient expressed understanding and was in agreement with this plan. He also understands that He  can call clinic at any time with any questions, concerns, or complaints.    Lloyd Huger, MD   11/18/2015 12:09 AM

## 2015-11-17 ENCOUNTER — Inpatient Hospital Stay: Payer: Medicare Other

## 2015-11-17 ENCOUNTER — Inpatient Hospital Stay: Payer: Medicare Other | Attending: Oncology | Admitting: Oncology

## 2015-11-17 VITALS — BP 135/78 | HR 62 | Temp 94.7°F | Resp 22 | Ht 66.0 in | Wt 226.3 lb

## 2015-11-17 DIAGNOSIS — F17210 Nicotine dependence, cigarettes, uncomplicated: Secondary | ICD-10-CM | POA: Diagnosis not present

## 2015-11-17 DIAGNOSIS — R531 Weakness: Secondary | ICD-10-CM | POA: Diagnosis not present

## 2015-11-17 DIAGNOSIS — C155 Malignant neoplasm of lower third of esophagus: Secondary | ICD-10-CM | POA: Diagnosis not present

## 2015-11-17 DIAGNOSIS — G4730 Sleep apnea, unspecified: Secondary | ICD-10-CM | POA: Diagnosis not present

## 2015-11-17 DIAGNOSIS — R5383 Other fatigue: Secondary | ICD-10-CM | POA: Diagnosis not present

## 2015-11-17 DIAGNOSIS — Z7901 Long term (current) use of anticoagulants: Secondary | ICD-10-CM | POA: Diagnosis not present

## 2015-11-17 DIAGNOSIS — Z79899 Other long term (current) drug therapy: Secondary | ICD-10-CM | POA: Diagnosis not present

## 2015-11-17 DIAGNOSIS — Z5111 Encounter for antineoplastic chemotherapy: Secondary | ICD-10-CM | POA: Insufficient documentation

## 2015-11-17 DIAGNOSIS — I10 Essential (primary) hypertension: Secondary | ICD-10-CM

## 2015-11-17 DIAGNOSIS — E876 Hypokalemia: Secondary | ICD-10-CM | POA: Diagnosis not present

## 2015-11-17 DIAGNOSIS — R110 Nausea: Secondary | ICD-10-CM | POA: Diagnosis not present

## 2015-11-17 DIAGNOSIS — I4891 Unspecified atrial fibrillation: Secondary | ICD-10-CM | POA: Diagnosis not present

## 2015-11-17 DIAGNOSIS — R630 Anorexia: Secondary | ICD-10-CM

## 2015-11-17 DIAGNOSIS — D649 Anemia, unspecified: Secondary | ICD-10-CM

## 2015-11-17 DIAGNOSIS — C159 Malignant neoplasm of esophagus, unspecified: Secondary | ICD-10-CM

## 2015-11-17 LAB — COMPREHENSIVE METABOLIC PANEL
ALK PHOS: 92 U/L (ref 38–126)
ALT: 13 U/L — AB (ref 17–63)
ANION GAP: 4 — AB (ref 5–15)
AST: 16 U/L (ref 15–41)
Albumin: 3.3 g/dL — ABNORMAL LOW (ref 3.5–5.0)
BILIRUBIN TOTAL: 0.6 mg/dL (ref 0.3–1.2)
BUN: 16 mg/dL (ref 6–20)
CALCIUM: 8.4 mg/dL — AB (ref 8.9–10.3)
CO2: 27 mmol/L (ref 22–32)
CREATININE: 0.77 mg/dL (ref 0.61–1.24)
Chloride: 104 mmol/L (ref 101–111)
Glucose, Bld: 89 mg/dL (ref 65–99)
Potassium: 3.6 mmol/L (ref 3.5–5.1)
SODIUM: 135 mmol/L (ref 135–145)
TOTAL PROTEIN: 5.9 g/dL — AB (ref 6.5–8.1)

## 2015-11-17 LAB — CBC WITH DIFFERENTIAL/PLATELET
BASOS ABS: 0.1 10*3/uL (ref 0–0.1)
BASOS PCT: 1 %
EOS ABS: 0.4 10*3/uL (ref 0–0.7)
Eosinophils Relative: 6 %
HEMATOCRIT: 32.6 % — AB (ref 40.0–52.0)
HEMOGLOBIN: 11.2 g/dL — AB (ref 13.0–18.0)
Lymphocytes Relative: 16 %
Lymphs Abs: 1 10*3/uL (ref 1.0–3.6)
MCH: 28.7 pg (ref 26.0–34.0)
MCHC: 34.5 g/dL (ref 32.0–36.0)
MCV: 83.2 fL (ref 80.0–100.0)
Monocytes Absolute: 0.4 10*3/uL (ref 0.2–1.0)
Monocytes Relative: 7 %
NEUTROS ABS: 4.5 10*3/uL (ref 1.4–6.5)
NEUTROS PCT: 70 %
Platelets: 220 10*3/uL (ref 150–440)
RBC: 3.91 MIL/uL — ABNORMAL LOW (ref 4.40–5.90)
RDW: 17.8 % — ABNORMAL HIGH (ref 11.5–14.5)
WBC: 6.4 10*3/uL (ref 3.8–10.6)

## 2015-11-17 MED ORDER — SODIUM CHLORIDE 0.9 % IV SOLN
Freq: Once | INTRAVENOUS | Status: AC
Start: 2015-11-17 — End: 2015-11-17
  Administered 2015-11-17: 12:00:00 via INTRAVENOUS
  Filled 2015-11-17: qty 1000

## 2015-11-17 MED ORDER — PACLITAXEL CHEMO INJECTION 300 MG/50ML
80.0000 mg/m2 | Freq: Once | INTRAVENOUS | Status: AC
  Administered 2015-11-17: 174 mg via INTRAVENOUS
  Filled 2015-11-17: qty 29

## 2015-11-17 MED ORDER — MORPHINE SULFATE 2 MG/ML IJ SOLN
2.0000 mg | Freq: Once | INTRAMUSCULAR | Status: AC
  Administered 2015-11-17: 2 mg via INTRAVENOUS
  Filled 2015-11-17: qty 1

## 2015-11-17 MED ORDER — SODIUM CHLORIDE 0.9% FLUSH
10.0000 mL | INTRAVENOUS | Status: DC | PRN
  Filled 2015-11-17: qty 10

## 2015-11-17 MED ORDER — FAMOTIDINE IN NACL 20-0.9 MG/50ML-% IV SOLN
20.0000 mg | Freq: Once | INTRAVENOUS | Status: AC
  Administered 2015-11-17: 20 mg via INTRAVENOUS
  Filled 2015-11-17: qty 50

## 2015-11-17 MED ORDER — DIPHENHYDRAMINE HCL 50 MG/ML IJ SOLN
25.0000 mg | Freq: Once | INTRAMUSCULAR | Status: AC
Start: 2015-11-17 — End: 2015-11-17
  Administered 2015-11-17: 25 mg via INTRAVENOUS
  Filled 2015-11-17: qty 1

## 2015-11-17 MED ORDER — HEPARIN SOD (PORK) LOCK FLUSH 100 UNIT/ML IV SOLN
500.0000 [IU] | Freq: Once | INTRAVENOUS | Status: AC | PRN
Start: 2015-11-17 — End: 2015-11-17
  Administered 2015-11-17: 500 [IU]
  Filled 2015-11-17: qty 5

## 2015-11-17 MED ORDER — DEXAMETHASONE SODIUM PHOSPHATE 100 MG/10ML IJ SOLN
10.0000 mg | Freq: Once | INTRAMUSCULAR | Status: AC
  Administered 2015-11-17: 10 mg via INTRAVENOUS
  Filled 2015-11-17: qty 1

## 2015-11-17 NOTE — Progress Notes (Signed)
Pt reports he has felt worse this time since his treatment.   He can eat a little bit gets nauseated and feel like it will all come back up. Just has an over all feeling no feeling well.  Pt would like to know if MD has had opportunity to call in nausea medication

## 2015-11-30 NOTE — Progress Notes (Signed)
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  INTERVAL HISTORY: Patient returns to clinic today for further evaluation and consideration of cycle 4, day 1 of single agent Taxol. Patient does not complain of dysphagia or difficulty swallowing today and has a good appetite. He does not complain of reflux today. He does not complain of weakness and fatigue. He has had several days of nausea, but this has resolved. He has no neurologic complaints. He denies any recent fevers. He denies any shortness of breath or chest pain. He denies any vomiting, constipation, or diarrhea. He has no melena or hematochezia. He has no urinary complaints. Patient offers no further specific complaints today.  REVIEW OF SYSTEMS:   Review of Systems  Constitutional: Negative for fever, malaise/fatigue and weight loss.  Respiratory: Negative.  Negative for shortness of breath.   Cardiovascular: Negative for chest pain.  Gastrointestinal: Positive for nausea. Negative for abdominal pain, blood in stool and melena.  Musculoskeletal: Negative.   Neurological: Negative.  Negative for weakness.  Endo/Heme/Allergies: Does not bruise/bleed easily.  Psychiatric/Behavioral: Negative.     As per HPI. Otherwise, a complete review of systems is negatve.   PAST MEDICAL HISTORY:  Hypertension, sleep apnea, atrial fibrillation.  PAST SURGICAL HISTORY: Negative.  FAMILY HISTORY: Reviewed and unchanged. No report of malignancy or chronic disease.     ADVANCED DIRECTIVES:    HEALTH MAINTENANCE: Social History  Substance Use Topics  . Smoking status: Current Some Day Smoker   Years: 62.00  . Smokeless tobacco: Current User    Types: Snuff  . Alcohol use No     No Known Allergies  Current Outpatient Prescriptions  Medication Sig Dispense Refill  . albuterol (PROVENTIL HFA;VENTOLIN HFA) 108 (90 Base) MCG/ACT inhaler Inhale into the lungs.    . chlorpheniramine-HYDROcodone (TUSSIONEX) 10-8 MG/5ML SUER TAKE 5ML BY MOUTH EVERY TWELVE HOURS AS NEEDED FOR COUGH  0  . digoxin (LANOXIN) 0.125 MG tablet Take by mouth.    . diltiazem (CARDIZEM CD) 120 MG 24 hr capsule     . diphenoxylate-atropine (LOMOTIL) 2.5-0.025 MG tablet Take by mouth.    . enalapril (VASOTEC) 20 MG tablet Take by mouth.    . finasteride (PROSCAR) 5 MG tablet Take 5 mg by mouth daily.    . fluticasone (FLONASE) 50 MCG/ACT nasal spray ONE SPRAY IN EACH NOSTRIL AT NIGHT AS DIRECTED  11  . fluticasone (FLOVENT HFA) 220 MCG/ACT inhaler Inhale into the lungs.    . furosemide (LASIX) 20 MG tablet Take 20 mg by mouth.    . gabapentin (NEURONTIN) 300 MG capsule Take 300 mg by mouth.    Marland Kitchen HYDROcodone-acetaminophen (NORCO) 10-325 MG tablet Take 1 tablet by mouth every 6 (six) hours as needed. 30 tablet 0  . magic mouthwash SOLN Take 5-10 mLs by mouth 4 (four) times daily. 480 mL 2  . metoprolol (LOPRESSOR) 50 MG tablet Take 50 mg by mouth.    . nystatin (MYCOSTATIN) 100000 UNIT/ML suspension     . omeprazole (PRILOSEC) 20 MG capsule Take 20 mg by mouth.    . prochlorperazine (COMPAZINE) 10 MG tablet Take 1 tablet (10 mg total) by mouth every 6 (six) hours as needed for  nausea or vomiting. 90 tablet 5  . sucralfate (CARAFATE) 1 GM/10ML suspension Take 10 mLs (1 g total) by mouth 4 (four) times daily -  with meals and at bedtime. 420 mL 1  . tamsulosin (FLOMAX) 0.4 MG CAPS capsule Take 0.4 mg by mouth daily.  3  . warfarin (COUMADIN) 4 MG tablet Take 2 tablets by mouth with evening meal     No current facility-administered medications for this visit.    Facility-Administered Medications Ordered in Other  Visits  Medication Dose Route Frequency Provider Last Rate Last Dose  . sodium chloride 0.9 % injection 10 mL  10 mL Intravenous PRN Lloyd Huger, MD   10 mL at 12/23/14 0945  . sodium chloride 0.9 % injection 10 mL  10 mL Intravenous PRN Lloyd Huger, MD   10 mL at 01/15/15 1512  . sodium chloride flush (NS) 0.9 % injection 10 mL  10 mL Intravenous PRN Lloyd Huger, MD   10 mL at 11/10/15 0939    OBJECTIVE: Vitals:   12/01/15 0948  Resp: 18  Temp: 97.2 F (36.2 C)     Body mass index is 36.86 kg/m.    ECOG FS:1 - Symptomatic but completely ambulatory  General: Well-developed, well-nourished, no acute distress. Eyes: anicteric sclera. Lungs: Clear to auscultation bilaterally. Heart: Regular rate and rhythm. No rubs, murmurs, or gallops. Abdomen: Soft, nontender, nondistended. No organomegaly noted, normoactive bowel sounds. Musculoskeletal: No edema, cyanosis, or clubbing. Neuro: Alert, answering all questions appropriately. Cranial nerves grossly intact. Skin: No rashes or petechiae noted. Psych: Normal affect.   LAB RESULTS:  Lab Results  Component Value Date   NA 135 12/01/2015   K 3.5 12/01/2015   CL 103 12/01/2015   CO2 27 12/01/2015   GLUCOSE 142 (H) 12/01/2015   BUN 10 12/01/2015   CREATININE 0.89 12/01/2015   CALCIUM 8.4 (L) 12/01/2015   PROT 6.1 (L) 12/01/2015   ALBUMIN 3.3 (L) 12/01/2015   AST 21 12/01/2015   ALT 15 (L) 12/01/2015   ALKPHOS 78 12/01/2015   BILITOT 0.7 12/01/2015   GFRNONAA >60 12/01/2015   GFRAA >60 12/01/2015    Lab Results  Component Value Date   WBC 4.2 12/01/2015   NEUTROABS 2.4 12/01/2015   HGB 11.7 (L) 12/01/2015   HCT 33.9 (L) 12/01/2015   MCV 85.2 12/01/2015   PLT 193 12/01/2015     STUDIES: No results found.  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  PLAN:    1. 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 PET scan results reviewed independently with only evidence of local recurrence. Patient is not a surgical candidate and no longer can receive XRT. Proceed with cycle 4, day 1 single agent Taxol today. Ramucirumab was discontinued secondary to reaction. Reimage after completion of cycle 4. Return to clinic in 1 week for consideration of cycle 4, day 8. 2. Poor appetite: Improved, monitor. 3. Hypokalemia: Resolved. Continue oral potassium supplementation. 4. Anemia: Mild, monitor. 5. Nausea: Continue Compazine as needed.  Patient expressed understanding and was in agreement with this plan. He also understands that He can call clinic at any time with any questions, concerns, or complaints.    Lloyd Huger, MD   12/01/2015 10:22 PM

## 2015-12-01 ENCOUNTER — Inpatient Hospital Stay: Payer: Medicare Other

## 2015-12-01 ENCOUNTER — Inpatient Hospital Stay (HOSPITAL_BASED_OUTPATIENT_CLINIC_OR_DEPARTMENT_OTHER): Payer: Medicare Other | Admitting: Oncology

## 2015-12-01 VITALS — Temp 97.2°F | Resp 18 | Wt 228.4 lb

## 2015-12-01 DIAGNOSIS — R531 Weakness: Secondary | ICD-10-CM

## 2015-12-01 DIAGNOSIS — D649 Anemia, unspecified: Secondary | ICD-10-CM

## 2015-12-01 DIAGNOSIS — R110 Nausea: Secondary | ICD-10-CM

## 2015-12-01 DIAGNOSIS — C155 Malignant neoplasm of lower third of esophagus: Secondary | ICD-10-CM

## 2015-12-01 DIAGNOSIS — R630 Anorexia: Secondary | ICD-10-CM | POA: Diagnosis not present

## 2015-12-01 DIAGNOSIS — I4891 Unspecified atrial fibrillation: Secondary | ICD-10-CM

## 2015-12-01 DIAGNOSIS — C159 Malignant neoplasm of esophagus, unspecified: Secondary | ICD-10-CM

## 2015-12-01 DIAGNOSIS — I10 Essential (primary) hypertension: Secondary | ICD-10-CM

## 2015-12-01 DIAGNOSIS — R5383 Other fatigue: Secondary | ICD-10-CM

## 2015-12-01 DIAGNOSIS — Z7901 Long term (current) use of anticoagulants: Secondary | ICD-10-CM

## 2015-12-01 DIAGNOSIS — Z79899 Other long term (current) drug therapy: Secondary | ICD-10-CM

## 2015-12-01 DIAGNOSIS — F17210 Nicotine dependence, cigarettes, uncomplicated: Secondary | ICD-10-CM

## 2015-12-01 DIAGNOSIS — G4730 Sleep apnea, unspecified: Secondary | ICD-10-CM

## 2015-12-01 LAB — CBC WITH DIFFERENTIAL/PLATELET
BASOS ABS: 0 10*3/uL (ref 0–0.1)
Basophils Relative: 1 %
EOS ABS: 0.2 10*3/uL (ref 0–0.7)
EOS PCT: 4 %
HCT: 33.9 % — ABNORMAL LOW (ref 40.0–52.0)
Hemoglobin: 11.7 g/dL — ABNORMAL LOW (ref 13.0–18.0)
Lymphocytes Relative: 22 %
Lymphs Abs: 0.9 10*3/uL — ABNORMAL LOW (ref 1.0–3.6)
MCH: 29.5 pg (ref 26.0–34.0)
MCHC: 34.6 g/dL (ref 32.0–36.0)
MCV: 85.2 fL (ref 80.0–100.0)
Monocytes Absolute: 0.7 10*3/uL (ref 0.2–1.0)
Monocytes Relative: 17 %
Neutro Abs: 2.4 10*3/uL (ref 1.4–6.5)
Neutrophils Relative %: 56 %
PLATELETS: 193 10*3/uL (ref 150–440)
RBC: 3.98 MIL/uL — AB (ref 4.40–5.90)
RDW: 20.3 % — ABNORMAL HIGH (ref 11.5–14.5)
WBC: 4.2 10*3/uL (ref 3.8–10.6)

## 2015-12-01 LAB — COMPREHENSIVE METABOLIC PANEL
ALT: 15 U/L — AB (ref 17–63)
AST: 21 U/L (ref 15–41)
Albumin: 3.3 g/dL — ABNORMAL LOW (ref 3.5–5.0)
Alkaline Phosphatase: 78 U/L (ref 38–126)
Anion gap: 5 (ref 5–15)
BILIRUBIN TOTAL: 0.7 mg/dL (ref 0.3–1.2)
BUN: 10 mg/dL (ref 6–20)
CO2: 27 mmol/L (ref 22–32)
CREATININE: 0.89 mg/dL (ref 0.61–1.24)
Calcium: 8.4 mg/dL — ABNORMAL LOW (ref 8.9–10.3)
Chloride: 103 mmol/L (ref 101–111)
GFR calc Af Amer: >60 mL/min (ref >60)
Glucose, Bld: 142 mg/dL — ABNORMAL HIGH (ref 65–99)
Potassium: 3.5 mmol/L (ref 3.5–5.1)
Sodium: 135 mmol/L (ref 135–145)
TOTAL PROTEIN: 6.1 g/dL — AB (ref 6.5–8.1)

## 2015-12-01 MED ORDER — HEPARIN SOD (PORK) LOCK FLUSH 100 UNIT/ML IV SOLN
500.0000 [IU] | Freq: Once | INTRAVENOUS | Status: AC
  Administered 2015-12-01: 500 [IU] via INTRAVENOUS
  Filled 2015-12-01: qty 5

## 2015-12-01 MED ORDER — SODIUM CHLORIDE 0.9 % IJ SOLN
10.0000 mL | Freq: Once | INTRAMUSCULAR | Status: AC
  Administered 2015-12-01: 10 mL via INTRAVENOUS
  Filled 2015-12-01: qty 10

## 2015-12-01 MED ORDER — FAMOTIDINE IN NACL 20-0.9 MG/50ML-% IV SOLN
20.0000 mg | Freq: Once | INTRAVENOUS | Status: AC
  Administered 2015-12-01: 20 mg via INTRAVENOUS
  Filled 2015-12-01: qty 50

## 2015-12-01 MED ORDER — HYDROCODONE-ACETAMINOPHEN 10-325 MG PO TABS: 1.0000 | ORAL_TABLET | Freq: Four times a day (QID) | ORAL | 0 refills | Status: DC | PRN

## 2015-12-01 MED ORDER — PACLITAXEL CHEMO INJECTION 300 MG/50ML
80.0000 mg/m2 | Freq: Once | INTRAVENOUS | Status: AC
  Administered 2015-12-01: 174 mg via INTRAVENOUS
  Filled 2015-12-01: qty 29

## 2015-12-01 MED ORDER — SODIUM CHLORIDE 0.9 % IV SOLN
10.0000 mg | Freq: Once | INTRAVENOUS | Status: AC
  Administered 2015-12-01: 10 mg via INTRAVENOUS
  Filled 2015-12-01: qty 1

## 2015-12-01 MED ORDER — SODIUM CHLORIDE 0.9 % IV SOLN
Freq: Once | INTRAVENOUS | Status: AC
  Administered 2015-12-01: 11:00:00 via INTRAVENOUS
  Filled 2015-12-01: qty 1000

## 2015-12-01 MED ORDER — DIPHENHYDRAMINE HCL 50 MG/ML IJ SOLN
25.0000 mg | Freq: Once | INTRAMUSCULAR | Status: AC
  Administered 2015-12-01: 25 mg via INTRAVENOUS
  Filled 2015-12-01: qty 1

## 2015-12-01 NOTE — Progress Notes (Signed)
States after last treatment had decreased appetite and decreased energy levels. Feeling slightly better today.

## 2015-12-03 ENCOUNTER — Telehealth: Payer: Self-pay | Admitting: *Deleted

## 2015-12-03 MED ORDER — OMEPRAZOLE 20 MG PO CPDR: 20.0000 mg | DELAYED_RELEASE_CAPSULE | Freq: Every day | ORAL | 2 refills | Status: DC

## 2015-12-03 NOTE — Telephone Encounter (Signed)
Called to report that he has had bad heartburn since chemo and his stomach meds are not helping. Please advise

## 2015-12-07 NOTE — Progress Notes (Signed)
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  INTERVAL HISTORY: Patient returns to clinic today for further evaluation and consideration of cycle 4, day 8 of single agent Taxol. Patient does not complain of dysphagia or difficulty swallowing today and has a good appetite. He does not complain of reflux today. He does not complain of weakness and fatigue. He has no neurologic complaints. He denies any recent fevers. He denies any shortness of breath or chest pain. He denies any nausea, vomiting, constipation, or diarrhea. He has no melena or hematochezia. He has no urinary complaints. Patient offers no specific complaints today.  REVIEW OF SYSTEMS:   Review of Systems  Constitutional: Negative for fever, malaise/fatigue and weight loss.  Respiratory: Negative.  Negative for shortness of breath.   Cardiovascular: Negative for chest pain.  Gastrointestinal: Negative for abdominal pain, blood in stool, melena and nausea.  Musculoskeletal: Negative.   Neurological: Negative.  Negative for weakness.  Endo/Heme/Allergies: Does not bruise/bleed easily.  Psychiatric/Behavioral: Negative.     As per HPI. Otherwise, a complete review of systems is negatve.   PAST MEDICAL HISTORY:  Hypertension, sleep apnea, atrial fibrillation.  PAST SURGICAL HISTORY: Negative.  FAMILY HISTORY: Reviewed and unchanged. No report of malignancy or chronic disease.     ADVANCED DIRECTIVES:    HEALTH MAINTENANCE: Social History  Substance Use Topics  . Smoking status: Current Some Day Smoker    Years: 62.00  . Smokeless tobacco: Current User    Types: Snuff  .  Alcohol use No     No Known Allergies  Current Outpatient Prescriptions  Medication Sig Dispense Refill  . albuterol (PROVENTIL HFA;VENTOLIN HFA) 108 (90 Base) MCG/ACT inhaler Inhale into the lungs.    . chlorpheniramine-HYDROcodone (TUSSIONEX) 10-8 MG/5ML SUER TAKE 5ML BY MOUTH EVERY TWELVE HOURS AS NEEDED FOR COUGH  0  . digoxin (LANOXIN) 0.125 MG tablet Take by mouth.    . diltiazem (CARDIZEM CD) 120 MG 24 hr capsule     . diphenoxylate-atropine (LOMOTIL) 2.5-0.025 MG tablet Take by mouth.    . enalapril (VASOTEC) 20 MG tablet Take by mouth.    . finasteride (PROSCAR) 5 MG tablet Take 5 mg by mouth daily.    . fluticasone (FLONASE) 50 MCG/ACT nasal spray ONE SPRAY IN EACH NOSTRIL AT NIGHT AS DIRECTED  11  . fluticasone (FLOVENT HFA) 220 MCG/ACT inhaler Inhale into the lungs.    . furosemide (LASIX) 20 MG tablet Take 20 mg by mouth.    . gabapentin (NEURONTIN) 300 MG capsule Take 300 mg by mouth.    Marland Kitchen HYDROcodone-acetaminophen (NORCO) 10-325 MG tablet Take 1 tablet by mouth every 6 (six) hours as needed. 30 tablet 0  . magic mouthwash SOLN Take 5-10 mLs by mouth 4 (four) times daily. 480 mL 2  . metoprolol (LOPRESSOR) 50 MG tablet Take 50 mg by mouth.    . nystatin (MYCOSTATIN) 100000 UNIT/ML suspension     . omeprazole (PRILOSEC) 20 MG capsule Take 1 capsule (20 mg total) by mouth daily. 30 capsule 2  . prochlorperazine (COMPAZINE) 10 MG tablet Take 1 tablet (10 mg total) by mouth every 6 (six) hours as needed for nausea or vomiting. 90 tablet 5  .  sucralfate (CARAFATE) 1 GM/10ML suspension Take 10 mLs (1 g total) by mouth 4 (four) times daily -  with meals and at bedtime. 420 mL 1  . tamsulosin (FLOMAX) 0.4 MG CAPS capsule Take 0.4 mg by mouth daily.  3  . warfarin (COUMADIN) 4 MG tablet Take 2 tablets by mouth with evening meal     No current facility-administered medications for this visit.    Facility-Administered Medications Ordered in Other Visits  Medication Dose Route  Frequency Provider Last Rate Last Dose  . heparin lock flush 100 unit/mL  500 Units Intravenous Once Lloyd Huger, MD      . sodium chloride 0.9 % injection 10 mL  10 mL Intravenous PRN Lloyd Huger, MD   10 mL at 12/23/14 0945  . sodium chloride 0.9 % injection 10 mL  10 mL Intravenous PRN Lloyd Huger, MD   10 mL at 01/15/15 1512  . sodium chloride flush (NS) 0.9 % injection 10 mL  10 mL Intravenous PRN Lloyd Huger, MD   10 mL at 11/10/15 0939    OBJECTIVE: Vitals:   12/08/15 0912  BP: (!) 144/90  Pulse: 81  Resp: 20  Temp: 97.7 F (36.5 C)     Body mass index is 36.95 kg/m.    ECOG FS:1 - Symptomatic but completely ambulatory  General: Well-developed, well-nourished, no acute distress. Eyes: anicteric sclera. Lungs: Clear to auscultation bilaterally. Heart: Regular rate and rhythm. No rubs, murmurs, or gallops. Abdomen: Soft, nontender, nondistended. No organomegaly noted, normoactive bowel sounds. Musculoskeletal: No edema, cyanosis, or clubbing. Neuro: Alert, answering all questions appropriately. Cranial nerves grossly intact. Skin: No rashes or petechiae noted. Psych: Normal affect.   LAB RESULTS:  Lab Results  Component Value Date   NA 135 12/08/2015   K 3.5 12/08/2015   CL 104 12/08/2015   CO2 25 12/08/2015   GLUCOSE 201 (H) 12/08/2015   BUN 16 12/08/2015   CREATININE 0.90 12/08/2015   CALCIUM 8.4 (L) 12/08/2015   PROT 5.9 (L) 12/08/2015   ALBUMIN 3.5 12/08/2015   AST 28 12/08/2015   ALT 14 (L) 12/08/2015   ALKPHOS 72 12/08/2015   BILITOT 0.6 12/08/2015   GFRNONAA >60 12/08/2015   GFRAA >60 12/08/2015    Lab Results  Component Value Date   WBC 6.1 12/08/2015   NEUTROABS 4.6 12/08/2015   HGB 11.6 (L) 12/08/2015   HCT 33.7 (L) 12/08/2015   MCV 86.7 12/08/2015   PLT 211 12/08/2015     STUDIES: No results found.  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  PLAN:    1.  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 PET scan results reviewed independently with only evidence of local recurrence. Patient is not a surgical candidate and no longer can receive XRT. Proceed with cycle 4, day 8 single agent Taxol today. Ramucirumab was discontinued secondary to reaction. Reimage after completion of cycle 4. Return to clinic in 1 week for consideration of cycle 4, day 15. Plan to reimage with PET scan as well as refer patient back for EGD at the conclusion of cycle 4. 2. Poor appetite: Improved, monitor. 3. Hypokalemia: Resolved. Continue oral potassium supplementation. 4. Anemia: Mild, monitor. 5. Nausea: Continue Compazine as needed.  Patient expressed understanding and was in agreement with this plan. He also understands that He can call clinic at any time with any questions, concerns, or  complaints.    Lloyd Huger, MD   12/08/2015 9:33 AM

## 2015-12-08 ENCOUNTER — Inpatient Hospital Stay (HOSPITAL_BASED_OUTPATIENT_CLINIC_OR_DEPARTMENT_OTHER): Payer: Medicare Other | Admitting: Oncology

## 2015-12-08 ENCOUNTER — Inpatient Hospital Stay: Payer: Medicare Other

## 2015-12-08 VITALS — BP 144/90 | HR 81 | Temp 97.7°F | Resp 20 | Wt 228.9 lb

## 2015-12-08 DIAGNOSIS — Z7901 Long term (current) use of anticoagulants: Secondary | ICD-10-CM

## 2015-12-08 DIAGNOSIS — F17210 Nicotine dependence, cigarettes, uncomplicated: Secondary | ICD-10-CM

## 2015-12-08 DIAGNOSIS — R110 Nausea: Secondary | ICD-10-CM | POA: Diagnosis not present

## 2015-12-08 DIAGNOSIS — R531 Weakness: Secondary | ICD-10-CM

## 2015-12-08 DIAGNOSIS — I4891 Unspecified atrial fibrillation: Secondary | ICD-10-CM

## 2015-12-08 DIAGNOSIS — Z79899 Other long term (current) drug therapy: Secondary | ICD-10-CM

## 2015-12-08 DIAGNOSIS — R5383 Other fatigue: Secondary | ICD-10-CM

## 2015-12-08 DIAGNOSIS — D649 Anemia, unspecified: Secondary | ICD-10-CM | POA: Diagnosis not present

## 2015-12-08 DIAGNOSIS — C155 Malignant neoplasm of lower third of esophagus: Secondary | ICD-10-CM

## 2015-12-08 DIAGNOSIS — R630 Anorexia: Secondary | ICD-10-CM

## 2015-12-08 DIAGNOSIS — G4730 Sleep apnea, unspecified: Secondary | ICD-10-CM

## 2015-12-08 DIAGNOSIS — C159 Malignant neoplasm of esophagus, unspecified: Secondary | ICD-10-CM

## 2015-12-08 DIAGNOSIS — I10 Essential (primary) hypertension: Secondary | ICD-10-CM

## 2015-12-08 LAB — CBC WITH DIFFERENTIAL/PLATELET
BASOS ABS: 0.1 10*3/uL (ref 0–0.1)
BASOS PCT: 1 %
EOS PCT: 3 %
Eosinophils Absolute: 0.2 10*3/uL (ref 0–0.7)
HCT: 33.7 % — ABNORMAL LOW (ref 40.0–52.0)
Hemoglobin: 11.6 g/dL — ABNORMAL LOW (ref 13.0–18.0)
LYMPHS PCT: 16 %
Lymphs Abs: 1 10*3/uL (ref 1.0–3.6)
MCH: 29.9 pg (ref 26.0–34.0)
MCHC: 34.4 g/dL (ref 32.0–36.0)
MCV: 86.7 fL (ref 80.0–100.0)
MONO ABS: 0.3 10*3/uL (ref 0.2–1.0)
Monocytes Relative: 5 %
Neutro Abs: 4.6 10*3/uL (ref 1.4–6.5)
Neutrophils Relative %: 75 %
PLATELETS: 211 10*3/uL (ref 150–440)
RBC: 3.89 MIL/uL — ABNORMAL LOW (ref 4.40–5.90)
RDW: 19.3 % — AB (ref 11.5–14.5)
WBC: 6.1 10*3/uL (ref 3.8–10.6)

## 2015-12-08 LAB — COMPREHENSIVE METABOLIC PANEL
ALBUMIN: 3.5 g/dL (ref 3.5–5.0)
ALK PHOS: 72 U/L (ref 38–126)
ALT: 14 U/L — ABNORMAL LOW (ref 17–63)
AST: 28 U/L (ref 15–41)
Anion gap: 6 (ref 5–15)
BILIRUBIN TOTAL: 0.6 mg/dL (ref 0.3–1.2)
BUN: 16 mg/dL (ref 6–20)
CALCIUM: 8.4 mg/dL — AB (ref 8.9–10.3)
CO2: 25 mmol/L (ref 22–32)
Chloride: 104 mmol/L (ref 101–111)
Creatinine, Ser: 0.9 mg/dL (ref 0.61–1.24)
GFR calc Af Amer: >60 mL/min (ref >60)
GFR calc non Af Amer: >60 mL/min (ref >60)
GLUCOSE: 201 mg/dL — AB (ref 65–99)
Potassium: 3.5 mmol/L (ref 3.5–5.1)
Sodium: 135 mmol/L (ref 135–145)
TOTAL PROTEIN: 5.9 g/dL — AB (ref 6.5–8.1)

## 2015-12-08 MED ORDER — SODIUM CHLORIDE 0.9 % IV SOLN
10.0000 mg | Freq: Once | INTRAVENOUS | Status: AC
  Administered 2015-12-08: 10 mg via INTRAVENOUS
  Filled 2015-12-08: qty 1

## 2015-12-08 MED ORDER — PACLITAXEL CHEMO INJECTION 300 MG/50ML
80.0000 mg/m2 | Freq: Once | INTRAVENOUS | Status: AC
  Administered 2015-12-08: 174 mg via INTRAVENOUS
  Filled 2015-12-08: qty 29

## 2015-12-08 MED ORDER — SODIUM CHLORIDE 0.9 % IV SOLN
Freq: Once | INTRAVENOUS | Status: AC
  Administered 2015-12-08: 10:00:00 via INTRAVENOUS
  Filled 2015-12-08: qty 1000

## 2015-12-08 MED ORDER — HEPARIN SOD (PORK) LOCK FLUSH 100 UNIT/ML IV SOLN
500.0000 [IU] | Freq: Once | INTRAVENOUS | Status: AC
  Administered 2015-12-08: 500 [IU] via INTRAVENOUS
  Filled 2015-12-08: qty 5

## 2015-12-08 MED ORDER — FAMOTIDINE IN NACL 20-0.9 MG/50ML-% IV SOLN
20.0000 mg | Freq: Once | INTRAVENOUS | Status: AC
  Administered 2015-12-08: 20 mg via INTRAVENOUS
  Filled 2015-12-08: qty 50

## 2015-12-08 MED ORDER — SODIUM CHLORIDE 0.9% FLUSH
10.0000 mL | Freq: Once | INTRAVENOUS | Status: AC
  Administered 2015-12-08: 10 mL via INTRAVENOUS
  Filled 2015-12-08: qty 10

## 2015-12-08 MED ORDER — DIPHENHYDRAMINE HCL 50 MG/ML IJ SOLN
25.0000 mg | Freq: Once | INTRAMUSCULAR | Status: AC
  Administered 2015-12-08: 25 mg via INTRAVENOUS
  Filled 2015-12-08: qty 1

## 2015-12-14 NOTE — Progress Notes (Signed)
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  INTERVAL HISTORY: Patient returns to clinic today for further evaluation and consideration of cycle 4, day 15 of single agent Taxol. Patient does not complain of dysphagia or difficulty swallowing today and has a good appetite. He does not complain of reflux today. He does not complain of weakness and fatigue. He has no neurologic complaints. He denies any recent fevers. He denies any shortness of breath or chest pain. He denies any nausea, vomiting, constipation, or diarrhea. He has no melena or hematochezia. He has no urinary complaints. Patient offers no specific complaints today.  REVIEW OF SYSTEMS:   Review of Systems  Constitutional: Negative for fever, malaise/fatigue and weight loss.  Respiratory: Negative.  Negative for shortness of breath.   Cardiovascular: Negative for chest pain.  Gastrointestinal: Negative for abdominal pain, blood in stool, melena and nausea.  Musculoskeletal: Negative.   Neurological: Negative.  Negative for weakness.  Endo/Heme/Allergies: Does not bruise/bleed easily.  Psychiatric/Behavioral: Negative.     As per HPI. Otherwise, a complete review of systems is negative.   PAST MEDICAL HISTORY:  Hypertension, sleep apnea, atrial fibrillation.  PAST SURGICAL HISTORY: Negative.  FAMILY HISTORY: Reviewed and unchanged. No report of malignancy or chronic disease.     ADVANCED DIRECTIVES:    HEALTH MAINTENANCE: Social History  Substance Use Topics  . Smoking status: Current Some Day Smoker    Years: 62.00  . Smokeless tobacco: Current User    Types: Snuff  .  Alcohol use No     No Known Allergies  Current Outpatient Prescriptions  Medication Sig Dispense Refill  . albuterol (PROVENTIL HFA;VENTOLIN HFA) 108 (90 Base) MCG/ACT inhaler Inhale into the lungs.    . chlorpheniramine-HYDROcodone (TUSSIONEX) 10-8 MG/5ML SUER TAKE 5ML BY MOUTH EVERY TWELVE HOURS AS NEEDED FOR COUGH  0  . digoxin (LANOXIN) 0.125 MG tablet Take by mouth.    . diltiazem (CARDIZEM CD) 120 MG 24 hr capsule     . diphenoxylate-atropine (LOMOTIL) 2.5-0.025 MG tablet Take by mouth.    . enalapril (VASOTEC) 20 MG tablet Take by mouth.    . finasteride (PROSCAR) 5 MG tablet Take 5 mg by mouth daily.    . fluticasone (FLONASE) 50 MCG/ACT nasal spray ONE SPRAY IN EACH NOSTRIL AT NIGHT AS DIRECTED  11  . fluticasone (FLOVENT HFA) 220 MCG/ACT inhaler Inhale into the lungs.    . furosemide (LASIX) 20 MG tablet Take 20 mg by mouth.    . gabapentin (NEURONTIN) 300 MG capsule Take 300 mg by mouth.    Marland Kitchen HYDROcodone-acetaminophen (NORCO) 10-325 MG tablet Take 1 tablet by mouth every 6 (six) hours as needed. 30 tablet 0  . magic mouthwash SOLN Take 5-10 mLs by mouth 4 (four) times daily. 480 mL 2  . metoprolol (LOPRESSOR) 50 MG tablet Take 50 mg by mouth.    . nystatin (MYCOSTATIN) 100000 UNIT/ML suspension     . omeprazole (PRILOSEC) 20 MG capsule Take 1 capsule (20 mg total) by mouth daily. 30 capsule 2  . prochlorperazine (COMPAZINE) 10 MG tablet Take 1 tablet (10 mg total) by mouth every 6 (six) hours as needed for nausea or vomiting. 90 tablet 5  .  sucralfate (CARAFATE) 1 GM/10ML suspension Take 10 mLs (1 g total) by mouth 4 (four) times daily -  with meals and at bedtime. 420 mL 1  . tamsulosin (FLOMAX) 0.4 MG CAPS capsule Take 0.4 mg by mouth daily.  3  . warfarin (COUMADIN) 4 MG tablet Take 2 tablets by mouth with evening meal     No current facility-administered medications for this visit.    Facility-Administered Medications Ordered in Other Visits  Medication Dose Route  Frequency Provider Last Rate Last Dose  . sodium chloride 0.9 % injection 10 mL  10 mL Intravenous PRN Lloyd Huger, MD   10 mL at 12/23/14 0945  . sodium chloride 0.9 % injection 10 mL  10 mL Intravenous PRN Lloyd Huger, MD   10 mL at 01/15/15 1512  . sodium chloride flush (NS) 0.9 % injection 10 mL  10 mL Intravenous PRN Lloyd Huger, MD   10 mL at 11/10/15 0939    OBJECTIVE: Vitals:   12/15/15 0852  BP: 126/72  Pulse: 76  Resp: 19  Temp: (!) 95 F (35 C)     Body mass index is 36.97 kg/m.    ECOG FS:1 - Symptomatic but completely ambulatory  General: Well-developed, well-nourished, no acute distress. Eyes: anicteric sclera. Lungs: Clear to auscultation bilaterally. Heart: Regular rate and rhythm. No rubs, murmurs, or gallops. Abdomen: Soft, nontender, nondistended. No organomegaly noted, normoactive bowel sounds. Musculoskeletal: No edema, cyanosis, or clubbing. Neuro: Alert, answering all questions appropriately. Cranial nerves grossly intact. Skin: No rashes or petechiae noted. Psych: Normal affect.   LAB RESULTS:  Lab Results  Component Value Date   NA 135 12/15/2015   K 3.4 (L) 12/15/2015   CL 103 12/15/2015   CO2 28 12/15/2015   GLUCOSE 116 (H) 12/15/2015   BUN 14 12/15/2015   CREATININE 0.80 12/15/2015   CALCIUM 8.4 (L) 12/15/2015   PROT 5.7 (L) 12/15/2015   ALBUMIN 3.4 (L) 12/15/2015   AST 20 12/15/2015   ALT 15 (L) 12/15/2015   ALKPHOS 66 12/15/2015   BILITOT 0.7 12/15/2015   GFRNONAA >60 12/15/2015   GFRAA >60 12/15/2015    Lab Results  Component Value Date   WBC 3.4 (L) 12/15/2015   NEUTROABS 2.2 12/15/2015   HGB 10.8 (L) 12/15/2015   HCT 31.7 (L) 12/15/2015   MCV 87.1 12/15/2015   PLT 175 12/15/2015     STUDIES: No results found.  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  PLAN:    1. 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 PET scan results reviewed independently with only evidence of local recurrence. Patient is not a surgical candidate and no longer can receive XRT. Proceed with cycle 4, day 15 single agent Taxol today. Ramucirumab was discontinued secondary to reaction. Patient has now completed this round of chemotherapy and will refer back to GI for repeat EGD. Will also get a repeat PET scan in 6 weeks. Return to clinic 1 to 2 days after his PET scan to discuss the results and further evaluation.  2. Poor appetite: Improved, monitor. 3. Hypokalemia: Resolved. Continue oral potassium supplementation. 4. Anemia: Mild, monitor. 5. Nausea: Continue Compazine as needed.  Patient expressed understanding and was in agreement with this plan. He also understands that He can call clinic at any time with any questions, concerns, or complaints.    Lloyd Huger, MD  12/15/2015 9:34 AM

## 2015-12-15 ENCOUNTER — Ambulatory Visit: Payer: Medicare Other

## 2015-12-15 ENCOUNTER — Encounter: Payer: Self-pay | Admitting: Oncology

## 2015-12-15 ENCOUNTER — Inpatient Hospital Stay: Payer: Medicare Other

## 2015-12-15 ENCOUNTER — Other Ambulatory Visit: Payer: Medicare Other

## 2015-12-15 ENCOUNTER — Ambulatory Visit: Payer: Medicare Other | Admitting: Oncology

## 2015-12-15 ENCOUNTER — Inpatient Hospital Stay (HOSPITAL_BASED_OUTPATIENT_CLINIC_OR_DEPARTMENT_OTHER): Payer: Medicare Other | Admitting: Oncology

## 2015-12-15 VITALS — BP 126/72 | HR 76 | Temp 95.0°F | Resp 19 | Ht 66.0 in | Wt 229.1 lb

## 2015-12-15 DIAGNOSIS — D649 Anemia, unspecified: Secondary | ICD-10-CM

## 2015-12-15 DIAGNOSIS — C155 Malignant neoplasm of lower third of esophagus: Secondary | ICD-10-CM | POA: Diagnosis not present

## 2015-12-15 DIAGNOSIS — I4891 Unspecified atrial fibrillation: Secondary | ICD-10-CM

## 2015-12-15 DIAGNOSIS — E876 Hypokalemia: Secondary | ICD-10-CM | POA: Diagnosis not present

## 2015-12-15 DIAGNOSIS — R630 Anorexia: Secondary | ICD-10-CM | POA: Diagnosis not present

## 2015-12-15 DIAGNOSIS — R5383 Other fatigue: Secondary | ICD-10-CM

## 2015-12-15 DIAGNOSIS — R531 Weakness: Secondary | ICD-10-CM

## 2015-12-15 DIAGNOSIS — F17210 Nicotine dependence, cigarettes, uncomplicated: Secondary | ICD-10-CM

## 2015-12-15 DIAGNOSIS — I10 Essential (primary) hypertension: Secondary | ICD-10-CM

## 2015-12-15 DIAGNOSIS — C159 Malignant neoplasm of esophagus, unspecified: Secondary | ICD-10-CM

## 2015-12-15 DIAGNOSIS — Z7901 Long term (current) use of anticoagulants: Secondary | ICD-10-CM

## 2015-12-15 DIAGNOSIS — Z79899 Other long term (current) drug therapy: Secondary | ICD-10-CM

## 2015-12-15 DIAGNOSIS — G4730 Sleep apnea, unspecified: Secondary | ICD-10-CM

## 2015-12-15 DIAGNOSIS — R110 Nausea: Secondary | ICD-10-CM

## 2015-12-15 LAB — CBC WITH DIFFERENTIAL/PLATELET
Basophils Absolute: 0 10*3/uL (ref 0–0.1)
Basophils Relative: 1 %
Eosinophils Absolute: 0.1 10*3/uL (ref 0–0.7)
Eosinophils Relative: 3 %
HCT: 31.7 % — ABNORMAL LOW (ref 40.0–52.0)
HEMOGLOBIN: 10.8 g/dL — AB (ref 13.0–18.0)
LYMPHS ABS: 0.8 10*3/uL — AB (ref 1.0–3.6)
LYMPHS PCT: 24 %
MCH: 29.7 pg (ref 26.0–34.0)
MCHC: 34.1 g/dL (ref 32.0–36.0)
MCV: 87.1 fL (ref 80.0–100.0)
MONOS PCT: 8 %
Monocytes Absolute: 0.3 10*3/uL (ref 0.2–1.0)
NEUTROS PCT: 64 %
Neutro Abs: 2.2 10*3/uL (ref 1.4–6.5)
Platelets: 175 10*3/uL (ref 150–440)
RBC: 3.64 MIL/uL — AB (ref 4.40–5.90)
RDW: 20.4 % — ABNORMAL HIGH (ref 11.5–14.5)
WBC: 3.4 10*3/uL — AB (ref 3.8–10.6)

## 2015-12-15 LAB — COMPREHENSIVE METABOLIC PANEL
ALBUMIN: 3.4 g/dL — AB (ref 3.5–5.0)
ALT: 15 U/L — ABNORMAL LOW (ref 17–63)
AST: 20 U/L (ref 15–41)
Alkaline Phosphatase: 66 U/L (ref 38–126)
Anion gap: 4 — ABNORMAL LOW (ref 5–15)
BILIRUBIN TOTAL: 0.7 mg/dL (ref 0.3–1.2)
BUN: 14 mg/dL (ref 6–20)
CO2: 28 mmol/L (ref 22–32)
Calcium: 8.4 mg/dL — ABNORMAL LOW (ref 8.9–10.3)
Chloride: 103 mmol/L (ref 101–111)
Creatinine, Ser: 0.8 mg/dL (ref 0.61–1.24)
GFR calc Af Amer: >60 mL/min (ref >60)
GFR calc non Af Amer: >60 mL/min (ref >60)
GLUCOSE: 116 mg/dL — AB (ref 65–99)
POTASSIUM: 3.4 mmol/L — AB (ref 3.5–5.1)
SODIUM: 135 mmol/L (ref 135–145)
TOTAL PROTEIN: 5.7 g/dL — AB (ref 6.5–8.1)

## 2015-12-15 MED ORDER — DEXTROSE 5 % IV SOLN
80.0000 mg/m2 | Freq: Once | INTRAVENOUS | Status: AC
Start: 2015-12-15 — End: 2015-12-15
  Administered 2015-12-15: 174 mg via INTRAVENOUS
  Filled 2015-12-15: qty 29

## 2015-12-15 MED ORDER — SODIUM CHLORIDE 0.9 % IV SOLN
10.0000 mg | Freq: Once | INTRAVENOUS | Status: AC
  Administered 2015-12-15: 10 mg via INTRAVENOUS
  Filled 2015-12-15: qty 1

## 2015-12-15 MED ORDER — HEPARIN SOD (PORK) LOCK FLUSH 100 UNIT/ML IV SOLN
500.0000 [IU] | Freq: Once | INTRAVENOUS | Status: AC | PRN
  Administered 2015-12-15: 500 [IU]
  Filled 2015-12-15: qty 5

## 2015-12-15 MED ORDER — FAMOTIDINE IN NACL 20-0.9 MG/50ML-% IV SOLN
20.0000 mg | Freq: Once | INTRAVENOUS | Status: AC
  Administered 2015-12-15: 20 mg via INTRAVENOUS
  Filled 2015-12-15: qty 50

## 2015-12-15 MED ORDER — DIPHENHYDRAMINE HCL 50 MG/ML IJ SOLN
25.0000 mg | Freq: Once | INTRAMUSCULAR | Status: AC
Start: 2015-12-15 — End: 2015-12-15
  Administered 2015-12-15: 25 mg via INTRAVENOUS
  Filled 2015-12-15: qty 1

## 2015-12-15 MED ORDER — SODIUM CHLORIDE 0.9 % IV SOLN
Freq: Once | INTRAVENOUS | Status: AC
  Administered 2015-12-15: 10:00:00 via INTRAVENOUS
  Filled 2015-12-15: qty 1000

## 2015-12-15 NOTE — Progress Notes (Signed)
Pt reports "soreness where the cancer is".  Nausea and vomiting after eating sometimes

## 2015-12-23 NOTE — Progress Notes (Signed)
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  INTERVAL HISTORY: Patient returns to clinic today as an add-on with increasing congestion without fever. He also has some increased weakness and fatigue. Patient does not complain of dysphagia or difficulty swallowing today and has a good appetite. He does not complain of reflux today.  He has no neurologic complaints. He denies any shortness of breath or chest pain. He denies any nausea, vomiting, constipation, or diarrhea. He has no melena or hematochezia. He has no urinary complaints. Patient offers no further specific complaints today.  REVIEW OF SYSTEMS:   Review of Systems  Constitutional: Positive for malaise/fatigue. Negative for fever and weight loss.  Respiratory: Negative.  Negative for shortness of breath.   Cardiovascular: Negative for chest pain.  Gastrointestinal: Negative for abdominal pain, blood in stool, melena and nausea.  Genitourinary: Negative.   Musculoskeletal: Negative.   Neurological: Positive for weakness.  Endo/Heme/Allergies: Does not bruise/bleed easily.  Psychiatric/Behavioral: Negative.  The patient is not nervous/anxious.     As per HPI. Otherwise, a complete review of systems is negative.   PAST MEDICAL HISTORY:  Hypertension, sleep apnea, atrial fibrillation.  PAST SURGICAL HISTORY: Negative.  FAMILY HISTORY: Reviewed and unchanged. No report of malignancy or chronic disease.     ADVANCED DIRECTIVES:    HEALTH MAINTENANCE: Social History  Substance Use Topics  . Smoking status: Current Some Day Smoker    Years: 62.00  . Smokeless tobacco: Current User     Types: Snuff  . Alcohol use No     No Known Allergies  Current Outpatient Prescriptions  Medication Sig Dispense Refill  . albuterol (PROVENTIL HFA;VENTOLIN HFA) 108 (90 Base) MCG/ACT inhaler Inhale into the lungs.    . chlorpheniramine-HYDROcodone (TUSSIONEX) 10-8 MG/5ML SUER TAKE 5ML BY MOUTH EVERY TWELVE HOURS AS NEEDED FOR COUGH  0  . digoxin (LANOXIN) 0.125 MG tablet Take by mouth.    . diltiazem (CARDIZEM CD) 120 MG 24 hr capsule     . diphenoxylate-atropine (LOMOTIL) 2.5-0.025 MG tablet Take by mouth.    . enalapril (VASOTEC) 20 MG tablet Take by mouth.    . finasteride (PROSCAR) 5 MG tablet Take 5 mg by mouth daily.    . fluticasone (FLONASE) 50 MCG/ACT nasal spray ONE SPRAY IN EACH NOSTRIL AT NIGHT AS DIRECTED  11  . fluticasone (FLOVENT HFA) 220 MCG/ACT inhaler Inhale into the lungs.    . furosemide (LASIX) 20 MG tablet Take 20 mg by mouth.    . gabapentin (NEURONTIN) 300 MG capsule Take 300 mg by mouth.    Marland Kitchen HYDROcodone-acetaminophen (NORCO) 10-325 MG tablet Take 1 tablet by mouth every 6 (six) hours as needed. 30 tablet 0  . magic mouthwash SOLN Take 5-10 mLs by mouth 4 (four) times daily. 480 mL 2  . metoprolol (LOPRESSOR) 50 MG tablet Take 50 mg by mouth.    . nystatin (MYCOSTATIN) 100000 UNIT/ML suspension     . omeprazole (PRILOSEC) 20 MG capsule Take 1 capsule (20 mg total) by mouth daily. 30 capsule 2  . prochlorperazine (COMPAZINE) 10 MG tablet Take 1 tablet (10 mg total) by mouth every 6 (six) hours as needed for nausea or vomiting. 90 tablet  5  . sucralfate (CARAFATE) 1 GM/10ML suspension Take 10 mLs (1 g total) by mouth 4 (four) times daily -  with meals and at bedtime. 420 mL 1  . tamsulosin (FLOMAX) 0.4 MG CAPS capsule Take 0.4 mg by mouth daily.  3  . warfarin (COUMADIN) 4 MG tablet Take 2 tablets by mouth with evening meal     No current facility-administered medications for this visit.    Facility-Administered Medications Ordered in Other Visits    Medication Dose Route Frequency Provider Last Rate Last Dose  . sodium chloride 0.9 % injection 10 mL  10 mL Intravenous PRN Lloyd Huger, MD   10 mL at 12/23/14 0945  . sodium chloride 0.9 % injection 10 mL  10 mL Intravenous PRN Lloyd Huger, MD   10 mL at 01/15/15 1512  . sodium chloride flush (NS) 0.9 % injection 10 mL  10 mL Intravenous PRN Lloyd Huger, MD   10 mL at 11/10/15 0939    OBJECTIVE: Vitals:   12/24/15 1506  BP: 120/67  Pulse: 84  Resp: 18  Temp: 97.5 F (36.4 C)     Body mass index is 38.7 kg/m.    ECOG FS:1 - Symptomatic but completely ambulatory  General: Well-developed, well-nourished, no acute distress. Eyes: anicteric sclera. HEENT: Oropharynx clear without exudate or erythema. No palpable lymphadenopathy. Lungs: Clear to auscultation bilaterally. Heart: Regular rate and rhythm. No rubs, murmurs, or gallops. Abdomen: Soft, nontender, nondistended. No organomegaly noted, normoactive bowel sounds. Musculoskeletal: No edema, cyanosis, or clubbing. Neuro: Alert, answering all questions appropriately. Cranial nerves grossly intact. Skin: No rashes or petechiae noted. Psych: Normal affect.   LAB RESULTS:  Lab Results  Component Value Date   NA 135 12/15/2015   K 3.4 (L) 12/15/2015   CL 103 12/15/2015   CO2 28 12/15/2015   GLUCOSE 116 (H) 12/15/2015   BUN 14 12/15/2015   CREATININE 0.80 12/15/2015   CALCIUM 8.4 (L) 12/15/2015   PROT 5.7 (L) 12/15/2015   ALBUMIN 3.4 (L) 12/15/2015   AST 20 12/15/2015   ALT 15 (L) 12/15/2015   ALKPHOS 66 12/15/2015   BILITOT 0.7 12/15/2015   GFRNONAA >60 12/15/2015   GFRAA >60 12/15/2015    Lab Results  Component Value Date   WBC 3.4 (L) 12/15/2015   NEUTROABS 2.2 12/15/2015   HGB 10.8 (L) 12/15/2015   HCT 31.7 (L) 12/15/2015   MCV 87.1 12/15/2015   PLT 175 12/15/2015     STUDIES: No results found.  ASSESSMENT: Recurrent adenocarcinoma of the lower third of esophagus, HER-2  negative.  PLAN:    1. 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 Patient is not a surgical candidate and no longer can receive XRT. He has been instructed to keep his previously scheduled PET scan as well as GI appointment for repeat EGD in approximately 6 weeks. Return to clinic 1 to 2 days after to discuss the results and further evaluation.  2. Congestion: Improving. Patient has been instructed to continue OTC medications as needed.  3. Hypokalemia: Mild. Continue oral potassium supplementation. 4. Anemia: Mild, monitor. 5. Nausea: Continue Compazine as needed.  Patient expressed understanding and was in agreement with this plan. He also understands that He can call clinic at any time with any questions, concerns, or complaints.    Lloyd Huger, MD   12/27/2015 9:58 PM

## 2015-12-24 ENCOUNTER — Inpatient Hospital Stay: Payer: Medicare Other | Attending: Oncology | Admitting: Oncology

## 2015-12-24 VITALS — BP 120/67 | HR 84 | Temp 97.5°F | Resp 18 | Wt 239.8 lb

## 2015-12-24 DIAGNOSIS — I4891 Unspecified atrial fibrillation: Secondary | ICD-10-CM | POA: Insufficient documentation

## 2015-12-24 DIAGNOSIS — R0981 Nasal congestion: Secondary | ICD-10-CM | POA: Diagnosis not present

## 2015-12-24 DIAGNOSIS — D649 Anemia, unspecified: Secondary | ICD-10-CM | POA: Diagnosis not present

## 2015-12-24 DIAGNOSIS — I10 Essential (primary) hypertension: Secondary | ICD-10-CM | POA: Diagnosis not present

## 2015-12-24 DIAGNOSIS — Z7901 Long term (current) use of anticoagulants: Secondary | ICD-10-CM

## 2015-12-24 DIAGNOSIS — R5383 Other fatigue: Secondary | ICD-10-CM | POA: Insufficient documentation

## 2015-12-24 DIAGNOSIS — C155 Malignant neoplasm of lower third of esophagus: Secondary | ICD-10-CM | POA: Diagnosis present

## 2015-12-24 DIAGNOSIS — E876 Hypokalemia: Secondary | ICD-10-CM | POA: Insufficient documentation

## 2015-12-24 DIAGNOSIS — Z79899 Other long term (current) drug therapy: Secondary | ICD-10-CM | POA: Insufficient documentation

## 2015-12-24 DIAGNOSIS — G4730 Sleep apnea, unspecified: Secondary | ICD-10-CM | POA: Diagnosis not present

## 2015-12-24 DIAGNOSIS — F17210 Nicotine dependence, cigarettes, uncomplicated: Secondary | ICD-10-CM | POA: Diagnosis not present

## 2015-12-24 DIAGNOSIS — R110 Nausea: Secondary | ICD-10-CM | POA: Insufficient documentation

## 2015-12-24 DIAGNOSIS — R531 Weakness: Secondary | ICD-10-CM | POA: Insufficient documentation

## 2015-12-24 NOTE — Progress Notes (Signed)
States is feeling well. Offers no complaints. 

## 2016-01-08 ENCOUNTER — Ambulatory Visit
Admission: EM | Admit: 2016-01-08 | Discharge: 2016-01-08 | Disposition: A | Discharge status: Home / Self Care | Payer: Medicare Other | Attending: Family Medicine | Admitting: Family Medicine

## 2016-01-08 DIAGNOSIS — J441 Chronic obstructive pulmonary disease with (acute) exacerbation: Secondary | ICD-10-CM | POA: Diagnosis not present

## 2016-01-08 MED ORDER — LEVOFLOXACIN 500 MG PO TABS: 500.0000 mg | ORAL_TABLET | Freq: Every day | ORAL | 0 refills | Status: DC

## 2016-01-08 MED ORDER — PREDNISONE 20 MG PO TABS: 20.0000 mg | ORAL_TABLET | Freq: Every day | ORAL | 0 refills | Status: DC

## 2016-01-08 NOTE — ED Triage Notes (Signed)
Patient complains of cough, congestions, fever. Patient states that he has body aches. Patient states that he has not been feeling good the past few days.

## 2016-01-08 NOTE — ED Provider Notes (Signed)
[facility]    CSN: [id number] Arrival date & time: 01/08/16  [id number]  First Provider Contact:  None       History   Chief Complaint Chief Complaint  Patient presents with  . Cough    HPI Jeremy Gordon is a 75 y.o. male.    URI  Presenting symptoms: congestion, cough, fatigue and fever   Severity:  Moderate Onset quality:  Sudden Duration:  5 days Timing:  Constant Progression:  Worsening Chronicity:  New Relieved by:  None tried Risk factors: being elderly, chronic cardiac disease, chronic respiratory disease and sick contacts   Risk factors: no chronic kidney disease, no diabetes mellitus, no immunosuppression, no recent illness and no recent travel     Past Medical History:  Diagnosis Date  . Arthritis   . Atrial fibrillation ([place])   . COPD (chronic obstructive pulmonary disease) ([place])   . Dysrhythmia   . Esophageal carcinoma ([place])    a. Chemo: FOLFIRI  . Essential hypertension   . History of stress test    a. 06/2003 MV: EF 56%, no ischemia/infarct.  . Persistent atrial fibrillation ([place])    a. 07/2013 Echo: EF 60-65%, mod dil LA, mild TR;  CHA2DS2VASc = 3-->chronic coumadin.  . Sleep apnea     Patient Active Problem List   Diagnosis Date Noted  . Malignant neoplasm of lower third of esophagus ([place]) 09/10/2015  . Pneumonitis due to inhalation of food or vomitus ([place]) 08/24/2015  . Persistent atrial fibrillation ([place]) 01/29/2015  . Ventricular tachycardia ([place]) 01/29/2015  . Sepsis ([place]) 01/29/2015  . Essential hypertension 01/29/2015  . Hypokalemia 01/29/2015  . Hypomagnesemia 01/29/2015  . Systemic infection ([place]) 01/29/2015  . Atrial fibrillation ([place])   . Aspiration pneumonia of right lower lobe ([place])   . Pneumonia 01/26/2015  . PNA (pneumonia) 01/26/2015  . Chest wall pain 10/08/2014  . Chest pain 10/08/2014  . Dysphagia 02/05/2014  . Apnea, sleep 10/09/2013  . Carpal tunnel syndrome 03/11/2013  . Sciatica 11/23/2012  . Acid  reflux 11/23/2012  . Gastro-esophageal reflux disease without esophagitis 11/23/2012  . 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 Enlarged prostate 10/15/2012  . Allergic rhinitis 08/15/2012  . Back ache 08/15/2012  . Atrial fibrillation, chronic ([place]) 08/15/2012  . CAFL (chronic airflow limitation) ([place]) 08/15/2012  . Breathlessness on exertion 08/15/2012  . BP (high blood pressure) 08/15/2012  . Malaise and fatigue 08/15/2012  . 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 Arthritis of knee, degenerative 08/15/2012  . 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 Lumbar canal stenosis 08/15/2012  . Chronic obstructive pulmonary disease ([place]) 08/15/2012  . Dorsalgia 08/15/2012  . Essential (primary) hypertension 08/15/2012  . Breath shortness 08/15/2012  . Does not feel right 08/15/2012  . Post viral syndrome 08/15/2012  . 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 ESOPHAGOGASTRODUODENOSCOPY (EGD) WITH PROPOFOL N/A 08/31/2015   Procedure: ESOPHAGOGASTRODUODENOSCOPY (EGD) WITH PROPOFOL;  Surgeon: Manya Silvas, MD;  Location: [facility] ENDOSCOPY;  Service: Endoscopy;  Laterality: N/A;  . HERNIA REPAIR    . PERIPHERAL VASCULAR CATHETERIZATION N/A 11/20/2014   Procedure: Glori Luis Cath Insertion;  Surgeon: Algernon Huxley, MD;  Location: [facility];  Service: Cardiovascular;  Laterality: N/A;       Home Medications    Prior to Admission medications   Medication Sig Start Date End Date Taking? Authorizing Provider  albuterol (PROVENTIL HFA;VENTOLIN HFA) 108 (90  Base) MCG/ACT inhaler Inhale into the lungs. 08/28/15 08/27/16 Yes Historical Provider, MD  digoxin (LANOXIN) 0.125 MG tablet Take by mouth. 02/02/15  Yes Historical Provider, MD  diltiazem (CARDIZEM CD) 120 MG 24 hr capsule  11/09/15  Yes Historical Provider, MD  diphenoxylate-atropine (LOMOTIL) 2.5-0.025 MG tablet Take by mouth. 06/10/15  Yes  Historical Provider, MD  enalapril (VASOTEC) 20 MG tablet Take by mouth.   Yes Historical Provider, MD  finasteride (PROSCAR) 5 MG tablet Take 5 mg by mouth daily.   Yes Historical Provider, MD  fluticasone (FLONASE) 50 MCG/ACT nasal spray ONE SPRAY IN EACH NOSTRIL AT NIGHT AS DIRECTED 07/01/15  Yes Historical Provider, MD  fluticasone (FLOVENT HFA) 220 MCG/ACT inhaler Inhale into the lungs. 08/28/15 08/27/16 Yes Historical Provider, MD  furosemide (LASIX) 20 MG tablet Take 20 mg by mouth. 09/05/14  Yes Historical Provider, MD  gabapentin (NEURONTIN) 300 MG capsule Take 300 mg by mouth. 07/07/15  Yes Historical Provider, MD  HYDROcodone-acetaminophen (NORCO) 10-325 MG tablet Take 1 tablet by mouth every 6 (six) hours as needed. 12/01/15  Yes Lloyd Huger, MD  magic mouthwash SOLN Take 5-10 mLs by mouth 4 (four) times daily. 09/29/15  Yes Lloyd Huger, MD  metoprolol (LOPRESSOR) 50 MG tablet Take 50 mg by mouth. 07/30/15 07/29/16 Yes Historical Provider, MD  nystatin (MYCOSTATIN) 100000 UNIT/ML suspension  09/30/15  Yes Historical Provider, MD  omeprazole (PRILOSEC) 20 MG capsule Take 1 capsule (20 mg total) by mouth daily. 12/03/15  Yes Lloyd Huger, MD  prochlorperazine (COMPAZINE) 10 MG tablet Take 1 tablet (10 mg total) by mouth every 6 (six) hours as needed for nausea or vomiting. 11/13/15  Yes Lloyd Huger, MD  sucralfate (CARAFATE) 1 GM/10ML suspension Take 10 mLs (1 g total) by mouth 4 (four) times daily -  with meals and at bedtime. 09/29/15  Yes Lloyd Huger, MD  tamsulosin (FLOMAX) 0.4 MG CAPS capsule Take 0.4 mg by mouth daily. 08/25/15  Yes Historical Provider, MD  warfarin (COUMADIN) 4 MG tablet Take 2 tablets by mouth with evening meal 07/27/15  Yes Historical Provider, MD  chlorpheniramine-HYDROcodone (TUSSIONEX) 10-8 MG/5ML SUER TAKE 5ML BY MOUTH EVERY TWELVE HOURS AS NEEDED FOR COUGH 08/13/15   Historical Provider, MD  levofloxacin (LEVAQUIN) 500 MG tablet Take 1 tablet  (500 mg total) by mouth daily. 01/08/16   Norval Gable, MD  predniSONE (DELTASONE) 20 MG tablet Take 1 tablet (20 mg total) by mouth daily. 01/08/16   Norval Gable, MD    Family History Family History  Problem Relation Age of Onset  . Cancer Mother   . Stroke Father   . Hypertension Sister   . Hypertension Brother     Social History Social History  Substance Use Topics  . Smoking status: Current Some Day Smoker    Years: 62.00  . Smokeless tobacco: Current User    Types: Snuff  . Alcohol use No     Allergies   Review of patient's allergies indicates no known allergies.   Review of Systems Review of Systems  Constitutional: Positive for fatigue and fever.  HENT: Positive for congestion.   Respiratory: Positive for cough.      Physical Exam Triage Vital Signs ED Triage Vitals  Enc Vitals Group     BP 01/08/16 1052 113/65     Pulse Rate 01/08/16 1052 60     Resp 01/08/16 1052 18     Temp 01/08/16 1052 97.3 F (36.3 C)  Temp Source 01/08/16 1052 Tympanic     SpO2 01/08/16 1052 98 %     Weight 01/08/16 1050 239 lb 12 oz (108.7 kg)     Height 01/08/16 1050 5\' 7"  (1.702 m)     Head Circumference --      Peak Flow --      Pain Score 01/08/16 1052 7     Pain Loc --      Pain Edu? --      Excl. in [place]? --    No data found.   Updated Vital Signs BP 113/65 (BP Location: Left Arm)   Pulse 60   Temp 97.3 F (36.3 C) (Tympanic)   Resp 18   Ht 5\' 7"  (1.702 m)   Wt 239 lb 12 oz (108.7 kg)   SpO2 98%   BMI 37.55 kg/m   Visual Acuity Right Eye Distance:   Left Eye Distance:   Bilateral Distance:    Right Eye Near:   Left Eye Near:    Bilateral Near:     Physical Exam  Constitutional: He appears well-developed and well-nourished. No distress. He is not intubated.  HENT:  Head: Normocephalic and atraumatic.  Right Ear: Tympanic membrane, external ear and ear canal normal.  Left Ear: Tympanic membrane, external ear and ear canal normal.  Nose: Nose  normal.  Mouth/Throat: Uvula is midline, oropharynx is clear and moist and mucous membranes are normal. No oropharyngeal exudate or tonsillar abscesses.  Eyes: Conjunctivae and EOM are normal. Pupils are equal, round, and reactive to light. Right eye exhibits no discharge. Left eye exhibits no discharge. No scleral icterus.  Neck: Normal range of motion. Neck supple. No tracheal deviation present. No thyromegaly present.  Cardiovascular: Normal rate, regular rhythm and normal heart sounds.   Pulmonary/Chest: Effort normal. No accessory muscle usage or stridor. No apnea, no tachypnea and no bradypnea. He is not intubated. No respiratory distress. He has no wheezes. He has rales in the left lower field. He exhibits no tenderness.  Lymphadenopathy:    He has no cervical adenopathy.  Neurological: He is alert.  Skin: Skin is warm and dry. No rash noted. He is not diaphoretic.  Nursing note and vitals reviewed.    UC Treatments / Results  Labs (all labs ordered are listed, but only abnormal results are displayed) Labs Reviewed - No data to display  EKG  EKG Interpretation None       Radiology No results found.  Procedures Procedures (including critical care time)  Medications Ordered in UC Medications - No data to display   Initial Impression / Assessment and Plan / UC Course  I have reviewed the triage vital signs and the nursing notes.  Pertinent labs & imaging results that were available during my care of the patient were reviewed by me and considered in my medical decision making (see chart for details).  Clinical Course     Final Clinical Impressions(s) / UC Diagnoses   Final diagnoses:  COPD exacerbation ([place])    New Prescriptions Discharge Medication List as of 01/08/2016 11:31 AM    START taking these medications   Details  levofloxacin (LEVAQUIN) 500 MG tablet Take 1 tablet (500 mg total) by mouth daily., Starting Fri 01/08/2016, Normal    predniSONE  (DELTASONE) 20 MG tablet Take 1 tablet (20 mg total) by mouth daily., Starting Fri 01/08/2016, Normal       1.  diagnosis reviewed with patient 2. rx as per orders above; reviewed possible side  effects, interactions, risks and benefits; due to new prescriptions explained to patient that I recommend he follow up with his PCP or cardiologist early next week for recheck of his coumadin (INR test) 3. Recommend supportive treatment with rest 4. Follow-up prn if symptoms worsen or don't improve   Norval Gable, MD 01/08/16 1451

## 2016-01-15 ENCOUNTER — Inpatient Hospital Stay: Payer: Medicare Other

## 2016-01-15 ENCOUNTER — Emergency Department: Payer: Medicare Other

## 2016-01-15 ENCOUNTER — Inpatient Hospital Stay
Admission: EM | Admit: 2016-01-15 | Discharge: 2016-01-19 | DRG: 481 | Disposition: A | Discharge status: Home Health | Payer: Medicare Other | Attending: Internal Medicine | Admitting: Internal Medicine

## 2016-01-15 ENCOUNTER — Encounter: Payer: Self-pay | Admitting: Emergency Medicine

## 2016-01-15 ENCOUNTER — Inpatient Hospital Stay
Admit: 2016-01-15 | Discharge: 2016-01-15 | Disposition: A | Discharge status: Home / Self Care | Payer: Medicare Other | Attending: Cardiovascular Disease | Admitting: Cardiovascular Disease

## 2016-01-15 DIAGNOSIS — N400 Enlarged prostate without lower urinary tract symptoms: Secondary | ICD-10-CM | POA: Diagnosis present

## 2016-01-15 DIAGNOSIS — Z79899 Other long term (current) drug therapy: Secondary | ICD-10-CM

## 2016-01-15 DIAGNOSIS — M25551 Pain in right hip: Secondary | ICD-10-CM

## 2016-01-15 DIAGNOSIS — S72001A Fracture of unspecified part of neck of right femur, initial encounter for closed fracture: Secondary | ICD-10-CM | POA: Diagnosis present

## 2016-01-15 DIAGNOSIS — R2689 Other abnormalities of gait and mobility: Secondary | ICD-10-CM

## 2016-01-15 DIAGNOSIS — D72829 Elevated white blood cell count, unspecified: Secondary | ICD-10-CM | POA: Diagnosis present

## 2016-01-15 DIAGNOSIS — E860 Dehydration: Secondary | ICD-10-CM | POA: Diagnosis present

## 2016-01-15 DIAGNOSIS — Z8249 Family history of ischemic heart disease and other diseases of the circulatory system: Secondary | ICD-10-CM | POA: Diagnosis not present

## 2016-01-15 DIAGNOSIS — C159 Malignant neoplasm of esophagus, unspecified: Secondary | ICD-10-CM | POA: Diagnosis present

## 2016-01-15 DIAGNOSIS — S72141A Displaced intertrochanteric fracture of right femur, initial encounter for closed fracture: Secondary | ICD-10-CM | POA: Diagnosis present

## 2016-01-15 DIAGNOSIS — F17200 Nicotine dependence, unspecified, uncomplicated: Secondary | ICD-10-CM | POA: Diagnosis present

## 2016-01-15 DIAGNOSIS — J449 Chronic obstructive pulmonary disease, unspecified: Secondary | ICD-10-CM | POA: Diagnosis present

## 2016-01-15 DIAGNOSIS — Z6836 Body mass index (BMI) 36.0-36.9, adult: Secondary | ICD-10-CM

## 2016-01-15 DIAGNOSIS — Z7901 Long term (current) use of anticoagulants: Secondary | ICD-10-CM | POA: Diagnosis not present

## 2016-01-15 DIAGNOSIS — I10 Essential (primary) hypertension: Secondary | ICD-10-CM | POA: Diagnosis present

## 2016-01-15 DIAGNOSIS — K219 Gastro-esophageal reflux disease without esophagitis: Secondary | ICD-10-CM | POA: Diagnosis present

## 2016-01-15 DIAGNOSIS — W08XXXA Fall from other furniture, initial encounter: Secondary | ICD-10-CM | POA: Diagnosis present

## 2016-01-15 DIAGNOSIS — M6281 Muscle weakness (generalized): Secondary | ICD-10-CM

## 2016-01-15 DIAGNOSIS — M25561 Pain in right knee: Secondary | ICD-10-CM | POA: Diagnosis present

## 2016-01-15 DIAGNOSIS — Z823 Family history of stroke: Secondary | ICD-10-CM | POA: Diagnosis not present

## 2016-01-15 DIAGNOSIS — M160 Bilateral primary osteoarthritis of hip: Secondary | ICD-10-CM | POA: Diagnosis present

## 2016-01-15 DIAGNOSIS — I482 Chronic atrial fibrillation: Secondary | ICD-10-CM | POA: Diagnosis present

## 2016-01-15 DIAGNOSIS — G4730 Sleep apnea, unspecified: Secondary | ICD-10-CM | POA: Diagnosis present

## 2016-01-15 DIAGNOSIS — I429 Cardiomyopathy, unspecified: Secondary | ICD-10-CM | POA: Diagnosis present

## 2016-01-15 DIAGNOSIS — T1490XA Injury, unspecified, initial encounter: Secondary | ICD-10-CM

## 2016-01-15 DIAGNOSIS — Z809 Family history of malignant neoplasm, unspecified: Secondary | ICD-10-CM

## 2016-01-15 LAB — URINALYSIS COMPLETE WITH MICROSCOPIC (ARMC ONLY)
BACTERIA UA: NONE SEEN
BILIRUBIN URINE: NEGATIVE
GLUCOSE, UA: NEGATIVE mg/dL
Hgb urine dipstick: NEGATIVE
LEUKOCYTES UA: NEGATIVE
NITRITE: NEGATIVE
Protein, ur: NEGATIVE mg/dL
Specific Gravity, Urine: 1.029 (ref 1.005–1.030)
pH: 5 (ref 5.0–8.0)

## 2016-01-15 LAB — PROTIME-INR
INR: 2.12
PROTHROMBIN TIME: 24.1 s — AB (ref 11.4–15.2)

## 2016-01-15 LAB — COMPREHENSIVE METABOLIC PANEL
ALBUMIN: 3.4 g/dL — AB (ref 3.5–5.0)
ALT: 15 U/L — AB (ref 17–63)
AST: 21 U/L (ref 15–41)
Alkaline Phosphatase: 59 U/L (ref 38–126)
Anion gap: 6 (ref 5–15)
BUN: 31 mg/dL — ABNORMAL HIGH (ref 6–20)
CHLORIDE: 105 mmol/L (ref 101–111)
CO2: 28 mmol/L (ref 22–32)
CREATININE: 1.1 mg/dL (ref 0.61–1.24)
Calcium: 8.9 mg/dL (ref 8.9–10.3)
GFR calc non Af Amer: >60 mL/min (ref >60)
Glucose, Bld: 104 mg/dL — ABNORMAL HIGH (ref 65–99)
Potassium: 3.5 mmol/L (ref 3.5–5.1)
SODIUM: 139 mmol/L (ref 135–145)
Total Bilirubin: 1 mg/dL (ref 0.3–1.2)
Total Protein: 5.7 g/dL — ABNORMAL LOW (ref 6.5–8.1)

## 2016-01-15 LAB — CBC WITH DIFFERENTIAL/PLATELET
BASOS ABS: 0.1 10*3/uL (ref 0–0.1)
BASOS PCT: 1 %
EOS ABS: 0.1 10*3/uL (ref 0–0.7)
EOS PCT: 1 %
HCT: 38.8 % — ABNORMAL LOW (ref 40.0–52.0)
Hemoglobin: 13.1 g/dL (ref 13.0–18.0)
LYMPHS ABS: 0.9 10*3/uL — AB (ref 1.0–3.6)
Lymphocytes Relative: 6 %
MCH: 30.3 pg (ref 26.0–34.0)
MCHC: 33.6 g/dL (ref 32.0–36.0)
MCV: 90.2 fL (ref 80.0–100.0)
Monocytes Absolute: 1.2 10*3/uL — ABNORMAL HIGH (ref 0.2–1.0)
Monocytes Relative: 8 %
Neutro Abs: 12.4 10*3/uL — ABNORMAL HIGH (ref 1.4–6.5)
Neutrophils Relative %: 84 %
PLATELETS: 194 10*3/uL (ref 150–440)
RBC: 4.3 MIL/uL — AB (ref 4.40–5.90)
RDW: 19 % — ABNORMAL HIGH (ref 11.5–14.5)
WBC: 14.7 10*3/uL — AB (ref 3.8–10.6)

## 2016-01-15 LAB — SURGICAL PCR SCREEN
MRSA, PCR: NEGATIVE
Staphylococcus aureus: NEGATIVE

## 2016-01-15 LAB — APTT: aPTT: 46 seconds — ABNORMAL HIGH (ref 24–36)

## 2016-01-15 MED ORDER — DIGOXIN 125 MCG PO TABS
0.1250 mg | ORAL_TABLET | Freq: Every day | ORAL | Status: DC
  Administered 2016-01-15 – 2016-01-16 (×2): 0.125 mg via ORAL
  Filled 2016-01-15 (×2): qty 1

## 2016-01-15 MED ORDER — SODIUM CHLORIDE 0.9% FLUSH: 3.0000 mL | Freq: Two times a day (BID) | INTRAVENOUS | Status: DC

## 2016-01-15 MED ORDER — TAMSULOSIN HCL 0.4 MG PO CAPS
0.4000 mg | ORAL_CAPSULE | Freq: Every day | ORAL | Status: DC
  Administered 2016-01-15 – 2016-01-16 (×2): 0.4 mg via ORAL
  Filled 2016-01-15 (×2): qty 1

## 2016-01-15 MED ORDER — ACETAMINOPHEN 325 MG PO TABS: 650.0000 mg | ORAL_TABLET | Freq: Four times a day (QID) | ORAL | Status: DC | PRN

## 2016-01-15 MED ORDER — VITAMIN K1 10 MG/ML IJ SOLN
10.0000 mg | Freq: Once | INTRAMUSCULAR | Status: AC
  Administered 2016-01-15: 10 mg via SUBCUTANEOUS
  Filled 2016-01-15: qty 1

## 2016-01-15 MED ORDER — ALBUTEROL SULFATE (2.5 MG/3ML) 0.083% IN NEBU: 2.5000 mg | INHALATION_SOLUTION | RESPIRATORY_TRACT | Status: DC | PRN

## 2016-01-15 MED ORDER — ACETAMINOPHEN 650 MG RE SUPP: 650.0000 mg | Freq: Four times a day (QID) | RECTAL | Status: DC | PRN

## 2016-01-15 MED ORDER — METOPROLOL TARTRATE 50 MG PO TABS
50.0000 mg | ORAL_TABLET | Freq: Two times a day (BID) | ORAL | Status: DC
  Administered 2016-01-15 – 2016-01-17 (×4): 50 mg via ORAL
  Filled 2016-01-15 (×4): qty 1

## 2016-01-15 MED ORDER — SUCRALFATE 1 GM/10ML PO SUSP
1.0000 g | Freq: Three times a day (TID) | ORAL | Status: DC
  Administered 2016-01-15 – 2016-01-16 (×4): 1 g via ORAL
  Filled 2016-01-15 (×9): qty 10

## 2016-01-15 MED ORDER — HYDROCODONE-ACETAMINOPHEN 5-325 MG PO TABS
1.0000 | ORAL_TABLET | ORAL | Status: DC | PRN
  Administered 2016-01-16: 1 via ORAL
  Administered 2016-01-16: 2 via ORAL
  Filled 2016-01-15: qty 2
  Filled 2016-01-15: qty 1

## 2016-01-15 MED ORDER — DILTIAZEM HCL ER COATED BEADS 120 MG PO CP24
120.0000 mg | ORAL_CAPSULE | Freq: Every day | ORAL | Status: DC
  Administered 2016-01-15 – 2016-01-16 (×2): 120 mg via ORAL
  Filled 2016-01-15 (×2): qty 1

## 2016-01-15 MED ORDER — ALBUTEROL SULFATE HFA 108 (90 BASE) MCG/ACT IN AERS: 1.0000 | INHALATION_SPRAY | RESPIRATORY_TRACT | Status: DC | PRN

## 2016-01-15 MED ORDER — HYDROCOD POLST-CPM POLST ER 10-8 MG/5ML PO SUER: 5.0000 mL | Freq: Two times a day (BID) | ORAL | Status: DC | PRN

## 2016-01-15 MED ORDER — PREDNISONE 20 MG PO TABS
20.0000 mg | ORAL_TABLET | Freq: Every day | ORAL | Status: DC
  Administered 2016-01-15 – 2016-01-16 (×2): 20 mg via ORAL
  Filled 2016-01-15 (×2): qty 1

## 2016-01-15 MED ORDER — PANTOPRAZOLE SODIUM 40 MG PO TBEC
40.0000 mg | DELAYED_RELEASE_TABLET | Freq: Every day | ORAL | Status: DC
  Administered 2016-01-15 – 2016-01-16 (×2): 40 mg via ORAL
  Filled 2016-01-15 (×2): qty 1

## 2016-01-15 MED ORDER — ONDANSETRON HCL 4 MG/2ML IJ SOLN: 4.0000 mg | Freq: Four times a day (QID) | INTRAMUSCULAR | Status: DC | PRN

## 2016-01-15 MED ORDER — MORPHINE SULFATE (PF) 4 MG/ML IV SOLN
4.0000 mg | Freq: Once | INTRAVENOUS | Status: AC
  Administered 2016-01-15: 4 mg via INTRAVENOUS
  Filled 2016-01-15: qty 1

## 2016-01-15 MED ORDER — KETOROLAC TROMETHAMINE 15 MG/ML IJ SOLN
15.0000 mg | Freq: Four times a day (QID) | INTRAMUSCULAR | Status: DC | PRN
  Administered 2016-01-15: 15 mg via INTRAVENOUS
  Filled 2016-01-15: qty 1

## 2016-01-15 MED ORDER — FINASTERIDE 5 MG PO TABS
5.0000 mg | ORAL_TABLET | Freq: Every day | ORAL | Status: DC
  Administered 2016-01-15 – 2016-01-16 (×2): 5 mg via ORAL
  Filled 2016-01-15 (×2): qty 1

## 2016-01-15 MED ORDER — ONDANSETRON HCL 4 MG PO TABS: 4.0000 mg | ORAL_TABLET | Freq: Four times a day (QID) | ORAL | Status: DC | PRN

## 2016-01-15 MED ORDER — SODIUM CHLORIDE 0.9 % IV SOLN
INTRAVENOUS | Status: DC
  Administered 2016-01-15 – 2016-01-16 (×3): via INTRAVENOUS

## 2016-01-15 NOTE — ED Notes (Signed)
Patient to and from x-ray

## 2016-01-15 NOTE — Progress Notes (Signed)
Jeremy Gordon is a 75 y.o. male  [id number]  Primary Cardiologist: Neoma Laming Reason for Consultation: Preoperative clearance for right hip fracture surgery  HPI: This is a 75 year old white male with a past medical history of atrial fibrillation on Coumadin presented to the hospital after falling down and has right hip fracture. Patient states he has chest pain all the time never had any cardiac workup to evaluate the etiology of chest pain.   Review of Systems: No chest pain at this time no shortness of breath no dizziness   Past Medical History:  Diagnosis Date  . Arthritis   . Atrial fibrillation ([place])   . COPD (chronic obstructive pulmonary disease) ([place])   . Dysrhythmia   . Esophageal carcinoma ([place])    a. Chemo: FOLFIRI  . Essential hypertension   . History of stress test    a. 06/2003 MV: EF 56%, no ischemia/infarct.  . Persistent atrial fibrillation ([place])    a. 07/2013 Echo: EF 60-65%, mod dil LA, mild TR;  CHA2DS2VASc = 3-->chronic coumadin.  . Sleep apnea     Medications Prior to Admission  Medication Sig Dispense Refill  . albuterol (PROVENTIL HFA;VENTOLIN HFA) 108 (90 Base) MCG/ACT inhaler Inhale into the lungs.    . digoxin (LANOXIN) 0.125 MG tablet Take by mouth.    . diltiazem (CARDIZEM CD) 120 MG 24 hr capsule     . diphenoxylate-atropine (LOMOTIL) 2.5-0.025 MG tablet Take by mouth.    . enalapril (VASOTEC) 20 MG tablet Take by mouth.    . gabapentin (NEURONTIN) 300 MG capsule Take 300 mg by mouth 2 (two) times daily.     Marland Kitchen HYDROcodone-acetaminophen (NORCO/VICODIN) 5-325 MG tablet Take 1 tablet by mouth every 6 (six) hours as needed for moderate pain.    Marland Kitchen levofloxacin (LEVAQUIN) 500 MG tablet Take 1 tablet (500 mg total) by mouth daily. 7 tablet 0  . metoprolol (LOPRESSOR) 50 MG tablet Take 50 mg by mouth.    Marland Kitchen omeprazole (PRILOSEC) 20 MG capsule Take 1 capsule (20 mg total) by mouth daily. 30 capsule 2  . predniSONE (DELTASONE) 20 MG tablet Take 1  tablet (20 mg total) by mouth daily. 7 tablet 0  . prochlorperazine (COMPAZINE) 10 MG tablet Take 1 tablet (10 mg total) by mouth every 6 (six) hours as needed for nausea or vomiting. 90 tablet 5  . sucralfate (CARAFATE) 1 GM/10ML suspension Take 10 mLs (1 g total) by mouth 4 (four) times daily -  with meals and at bedtime. 420 mL 1  . warfarin (COUMADIN) 4 MG tablet Take 2 tablets by mouth with evening meal    . chlorpheniramine-HYDROcodone (TUSSIONEX) 10-8 MG/5ML SUER TAKE 5ML BY MOUTH EVERY TWELVE HOURS AS NEEDED FOR COUGH  0  . finasteride (PROSCAR) 5 MG tablet Take 5 mg by mouth daily.    . fluticasone (FLONASE) 50 MCG/ACT nasal spray ONE SPRAY IN EACH NOSTRIL AT NIGHT AS DIRECTED  11  . fluticasone (FLOVENT HFA) 220 MCG/ACT inhaler Inhale into the lungs.    . furosemide (LASIX) 20 MG tablet Take 20 mg by mouth.    Marland Kitchen HYDROcodone-acetaminophen (NORCO) 10-325 MG tablet Take 1 tablet by mouth every 6 (six) hours as needed. (Patient not taking: Reported on 01/15/2016) 30 tablet 0  . magic mouthwash SOLN Take 5-10 mLs by mouth 4 (four) times daily. (Patient not taking: Reported on 01/15/2016) 480 mL 2  . nystatin (MYCOSTATIN) 100000 UNIT/ML suspension     . tamsulosin (FLOMAX) 0.4  MG CAPS capsule Take 0.4 mg by mouth daily.  3     . digoxin  0.125 mg Oral Daily  . diltiazem  120 mg Oral Daily  . finasteride  5 mg Oral Daily  . metoprolol  50 mg Oral BID  . pantoprazole  40 mg Oral Daily  . predniSONE  20 mg Oral Daily  . sodium chloride flush  3 mL Intravenous Q12H  . sucralfate  1 g Oral TID WC & HS  . tamsulosin  0.4 mg Oral Daily    Infusions: . sodium chloride 75 mL/hr at 01/15/16 1635    No Known Allergies  Social History   Social History  . Marital status: Married    Spouse name: N/A  . Number of children: N/A  . Years of education: N/A   Occupational History  . Not on file.   Social History Main Topics  . Smoking status: Current Some Day Smoker    Years: 62.00  .  Smokeless tobacco: Current User    Types: Snuff  . Alcohol use No  . Drug use: No  . Sexual activity: Not on file   Other Topics Concern  . Not on file   Social History Narrative  . No narrative on file    Family History  Problem Relation Age of Onset  . Cancer Mother   . Stroke Father   . Hypertension Sister   . Hypertension Brother     PHYSICAL EXAM: Vitals:   01/15/16 1513 01/15/16 1542  BP: 107/61 113/67  Pulse: 73 92  Resp: 20 20  Temp:  98.3 F (36.8 C)    No intake or output data in the 24 hours ending 01/15/16 1758  General:  Well appearing. No respiratory difficulty HEENT: normal Neck: supple. no JVD. Carotids 2+ bilat; no bruits. No lymphadenopathy or thryomegaly appreciated. Cor: PMI nondisplaced. Regular rate & rhythm. No rubs, gallops or murmurs. Lungs: clear Abdomen: soft, nontender, nondistended. No hepatosplenomegaly. No bruits or masses. Good bowel sounds. Extremities: no cyanosis, clubbing, rash, edema Neuro: alert & oriented x 3, cranial nerves grossly intact. moves all 4 extremities w/o difficulty. Affect pleasant.  ECG: A. fib with rapid ventricular response rate about 1 20/m  Results for orders placed or performed during the hospital encounter of 01/15/16 (from the past 24 hour(s))  CBC with Differential/Platelet     Status: Abnormal   Collection Time: 01/15/16  1:33 PM  Result Value Ref Range   WBC 14.7 (H) 3.8 - 10.6 K/uL   RBC 4.30 (L) 4.40 - 5.90 MIL/uL   Hemoglobin 13.1 13.0 - 18.0 g/dL   HCT 38.8 (L) 40.0 - 52.0 %   MCV 90.2 80.0 - 100.0 fL   MCH 30.3 26.0 - 34.0 pg   MCHC 33.6 32.0 - 36.0 g/dL   RDW 19.0 (H) 11.5 - 14.5 %   Platelets 194 150 - 440 K/uL   Neutrophils Relative % 84 %   Neutro Abs 12.4 (H) 1.4 - 6.5 K/uL   Lymphocytes Relative 6 %   Lymphs Abs 0.9 (L) 1.0 - 3.6 K/uL   Monocytes Relative 8 %   Monocytes Absolute 1.2 (H) 0.2 - 1.0 K/uL   Eosinophils Relative 1 %   Eosinophils Absolute 0.1 0 - 0.7 K/uL    Basophils Relative 1 %   Basophils Absolute 0.1 0 - 0.1 K/uL  Comprehensive metabolic panel     Status: Abnormal   Collection Time: 01/15/16  1:33 PM  Result Value Ref  Range   Sodium 139 135 - 145 mmol/L   Potassium 3.5 3.5 - 5.1 mmol/L   Chloride 105 101 - 111 mmol/L   CO2 28 22 - 32 mmol/L   Glucose, Bld 104 (H) 65 - 99 mg/dL   BUN 31 (H) 6 - 20 mg/dL   Creatinine, Ser 1.10 0.61 - 1.24 mg/dL   Calcium 8.9 8.9 - 10.3 mg/dL   Total Protein 5.7 (L) 6.5 - 8.1 g/dL   Albumin 3.4 (L) 3.5 - 5.0 g/dL   AST 21 15 - 41 U/L   ALT 15 (L) 17 - 63 U/L   Alkaline Phosphatase 59 38 - 126 U/L   Total Bilirubin 1.0 0.3 - 1.2 mg/dL   GFR calc non Af Amer >60 >60 mL/min   GFR calc Af Amer >60 >60 mL/min   Anion gap 6 5 - 15  APTT     Status: Abnormal   Collection Time: 01/15/16  1:33 PM  Result Value Ref Range   aPTT 46 (H) 24 - 36 seconds  Protime-INR     Status: Abnormal   Collection Time: 01/15/16  1:33 PM  Result Value Ref Range   Prothrombin Time 24.1 (H) 11.4 - 15.2 seconds   INR 2.12    Dg Knee 2 Views Right  Result Date: 01/15/2016 CLINICAL DATA:  Fall today. Right knee pain and inability to walk. Initial encounter. EXAM: RIGHT KNEE - 1-2 VIEW COMPARISON:  None. FINDINGS: No evidence of fracture, dislocation, or joint effusion. Mild to moderate tricompartmental osteoarthritis. Generalized osteopenia. No focal lytic or sclerotic bone lesions. Peripheral vascular calcification. IMPRESSION: No acute findings. Mild to moderate tricompartmental osteoarthritis. Electronically Signed   By: Earle Gell M.D.   On: 01/15/2016 13:21   Ct Hip Right Wo Contrast  Result Date: 01/15/2016 CLINICAL DATA:  Status post fall.  Right hip pain. EXAM: CT OF THE RIGHT HIP WITHOUT CONTRAST TECHNIQUE: Multidetector CT imaging of the right hip was performed according to the standard protocol. Multiplanar CT image reconstructions were also generated. COMPARISON:  None. FINDINGS: Bones/Joint/Cartilage Severe  osteopenia. Impacted nondisplaced right subcapital fracture. Additionally, acute intertrochanteric fracture with mild displacement. Mild osteoarthritis of the right hip. Mild osteoarthritis of the right sacroiliac joint. No aggressive lytic or sclerotic osseous lesion. Ligaments Suboptimally assessed by CT. Muscles and Tendons Muscles are normal.  No intramuscular hematoma or fluid collection. Soft tissues No fluid collection or hematoma. Peripheral vascular atherosclerotic disease. IMPRESSION: 1. Acute, impacted nondisplaced right subcapital fracture. Acute intertrochanteric fracture with mild displacement. Electronically Signed   By: Kathreen Devoid   On: 01/15/2016 15:42   Dg Chest Port 1 View  Result Date: 01/15/2016 CLINICAL DATA:  Patient had fall today and has pain of the right hip and right knee since fall and is unable to walk. Best images possible for patient condition at this time. EXAM: PORTABLE CHEST - 1 VIEW COMPARISON:  PET-CT 09/18/2015 and previous FINDINGS: Lungs are clear. Mild cardiomegaly. Atheromatous aorta. Stable right IJ port catheter to the cavoatrial junction. No effusion. Visualized bones unremarkable. IMPRESSION: 1. Stable mild cardiomegaly.  No acute disease. 2. Aortic Atherosclerosis (ICD10-170.0) Electronically Signed   By: Lucrezia Europe M.D.   On: 01/15/2016 13:25   Dg Hip Unilat With Pelvis 2-3 Views Right  Result Date: 01/15/2016 CLINICAL DATA:  Fall today. Right hip pain and inability to ambulate. Initial encounter. EXAM: DG HIP (WITH OR WITHOUT PELVIS) 2-3V RIGHT COMPARISON:  None. FINDINGS: Mildly displaced intertrochanteric right hip fracture is seen.  No evidence of dislocation. Mild right hip osteoarthritis and severe left hip osteoarthritis noted. No evidence of acetabular or pelvic fracture. IMPRESSION: Mildly displaced intertrochanteric right hip fracture. Bilateral hip osteoarthritis, left side greater than right. Electronically Signed   By: Earle Gell M.D.   On:  01/15/2016 13:24     ASSESSMENT AND PLAN: Right hip fracture requiring surgery is scheduled to have it on Sunday with Coumadin on hold we'll get an echocardiogram to evaluate ejection fraction. Patient probably is low to moderate risk for surgery as is hemodynamically stable.  Deshay Blumenfeld A

## 2016-01-15 NOTE — Consult Note (Signed)
ORTHOPAEDIC CONSULTATION REQUESTING PHYSICIAN: Demetrios Loll, MD  Chief Complaint: right hip pain  HPI:  Jeremy Gordon is a 75 y.o. male who complains of  Right hip pain after falling off a stool today.  Brought to ER where exam and x-rays show a comminuted fracture of the right hip.  CT further shows trochanteric fracture and femoral neck fracture.  Patient is being treated for esophageal cancer. Atrial fib rillation on coumadin, and hypertension.  Discussed this with patient and have recommended surgical fixation after coumadin is reversed.  Probably 48 hrs for this.  He agrees to surgery. Post op protocol discussed as well.   Past Medical History:  Diagnosis Date  . Arthritis   . Atrial fibrillation ([place])   . COPD (chronic obstructive pulmonary disease) ([place])   . Dysrhythmia   . 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 Chemo: FOLFIRI  . Essential hypertension   . History of stress test    a. 06/2003 MV: EF 56%, no ischemia/infarct.  . Persistent atrial fibrillation ([place])    a. 07/2013 Echo: EF 60-65%, mod dil LA, mild TR;  CHA2DS2VASc = 3-->chronic coumadin.  . Sleep apnea    Past Surgical History:  Procedure Laterality Date  . ESOPHAGOGASTRODUODENOSCOPY (EGD) WITH PROPOFOL N/A 08/31/2015   Procedure: ESOPHAGOGASTRODUODENOSCOPY (EGD) WITH PROPOFOL;  Surgeon: Manya Silvas, MD;  Location: [facility];  Service: Endoscopy;  Laterality: N/A;  . HERNIA REPAIR    . PERIPHERAL VASCULAR CATHETERIZATION N/A 11/20/2014   Procedure: Glori Luis Cath Insertion;  Surgeon: Algernon Huxley, MD;  Location: [facility];  Service: Cardiovascular;  Laterality: N/A;   Social History   Social History  . Marital status: Married    Spouse name: N/A  . Number of children: N/A  . Years of education: N/A   Social History Main Topics  . Smoking status: Current Some Day Smoker    Years: 62.00  . Smokeless tobacco: Current User    Types: Snuff  . Alcohol use No  . Drug use: No  . Sexual activity: Not  Asked   Other Topics Concern  . None   Social History Narrative  . None   Family History  Problem Relation Age of Onset  . Cancer Mother   . Stroke Father   . Hypertension Sister   . Hypertension Brother    No Known Allergies Prior to Admission medications   Medication Sig Start Date End Date Taking? Authorizing Provider  albuterol (PROVENTIL HFA;VENTOLIN HFA) 108 (90 Base) MCG/ACT inhaler Inhale into the lungs. 08/28/15 08/27/16 Yes Historical Provider, MD  digoxin (LANOXIN) 0.125 MG tablet Take by mouth. 02/02/15  Yes Historical Provider, MD  diltiazem (CARDIZEM CD) 120 MG 24 hr capsule  11/09/15  Yes Historical Provider, MD  diphenoxylate-atropine (LOMOTIL) 2.5-0.025 MG tablet Take by mouth. 06/10/15  Yes Historical Provider, MD  enalapril (VASOTEC) 20 MG tablet Take by mouth.   Yes Historical Provider, MD  gabapentin (NEURONTIN) 300 MG capsule Take 300 mg by mouth 2 (two) times daily.  07/07/15  Yes Historical Provider, MD  HYDROcodone-acetaminophen (NORCO/VICODIN) 5-325 MG tablet Take 1 tablet by mouth every 6 (six) hours as needed for moderate pain.   Yes Historical Provider, MD  levofloxacin (LEVAQUIN) 500 MG tablet Take 1 tablet (500 mg total) by mouth daily. 01/08/16  Yes Norval Gable, MD  metoprolol (LOPRESSOR) 50 MG tablet Take 50 mg by mouth. 07/30/15 07/29/16 Yes Historical Provider, MD  omeprazole (PRILOSEC) 20 MG capsule Take 1 capsule (20 mg  total) by mouth daily. 12/03/15  Yes Lloyd Huger, MD  predniSONE (DELTASONE) 20 MG tablet Take 1 tablet (20 mg total) by mouth daily. 01/08/16  Yes Norval Gable, MD  prochlorperazine (COMPAZINE) 10 MG tablet Take 1 tablet (10 mg total) by mouth every 6 (six) hours as needed for nausea or vomiting. 11/13/15  Yes Lloyd Huger, MD  sucralfate (CARAFATE) 1 GM/10ML suspension Take 10 mLs (1 g total) by mouth 4 (four) times daily -  with meals and at bedtime. 09/29/15  Yes Lloyd Huger, MD  warfarin (COUMADIN) 4 MG tablet Take 2  tablets by mouth with evening meal 07/27/15  Yes Historical Provider, MD  chlorpheniramine-HYDROcodone ([place]) 10-8 MG/5ML SUER TAKE 5ML BY MOUTH EVERY TWELVE HOURS AS NEEDED FOR COUGH 08/13/15   Historical Provider, MD  finasteride (PROSCAR) 5 MG tablet Take 5 mg by mouth daily.    Historical Provider, MD  fluticasone (FLONASE) 50 MCG/ACT nasal spray ONE SPRAY IN EACH NOSTRIL AT NIGHT AS DIRECTED 07/01/15   Historical Provider, MD  fluticasone (FLOVENT HFA) 220 MCG/ACT inhaler Inhale into the lungs. 08/28/15 08/27/16  Historical Provider, MD  furosemide (LASIX) 20 MG tablet Take 20 mg by mouth. 09/05/14   Historical Provider, MD  HYDROcodone-acetaminophen (NORCO) 10-325 MG tablet Take 1 tablet by mouth every 6 (six) hours as needed. Patient not taking: Reported on 01/15/2016 12/01/15   Lloyd Huger, MD  magic mouthwash SOLN Take 5-10 mLs by mouth 4 (four) times daily. Patient not taking: Reported on 01/15/2016 09/29/15   Lloyd Huger, MD  nystatin (MYCOSTATIN) 100000 UNIT/ML suspension  09/30/15   Historical Provider, MD  tamsulosin (FLOMAX) 0.4 MG CAPS capsule Take 0.4 mg by mouth daily. 08/25/15   Historical Provider, MD   Dg Knee 2 Views Right  Result Date: 01/15/2016 CLINICAL DATA:  Fall today. Right knee pain and inability to walk. Initial encounter. EXAM: RIGHT KNEE - 1-2 VIEW COMPARISON:  None. FINDINGS: No evidence of fracture, dislocation, or joint effusion. Mild to moderate tricompartmental osteoarthritis. Generalized osteopenia. No focal lytic or sclerotic bone lesions. Peripheral vascular calcification. IMPRESSION: No acute findings. Mild to moderate tricompartmental osteoarthritis. Electronically Signed   By: Earle Gell M.D.   On: 01/15/2016 13:21   Ct Hip Right Wo Contrast  Result Date: 01/15/2016 CLINICAL DATA:  Status post fall.  Right hip pain. EXAM: CT OF THE RIGHT HIP WITHOUT CONTRAST TECHNIQUE: Multidetector CT imaging of the right hip was performed according to the  standard protocol. Multiplanar CT image reconstructions were also generated. COMPARISON:  None. FINDINGS: Bones/Joint/Cartilage Severe osteopenia. Impacted nondisplaced right subcapital fracture. Additionally, acute intertrochanteric fracture with mild displacement. Mild osteoarthritis of the right hip. Mild osteoarthritis of the right sacroiliac joint. No aggressive lytic or sclerotic osseous lesion. Ligaments Suboptimally assessed by CT. Muscles and Tendons Muscles are normal.  No intramuscular hematoma or fluid collection. Soft tissues No fluid collection or hematoma. Peripheral vascular atherosclerotic disease. IMPRESSION: 1. Acute, impacted nondisplaced right subcapital fracture. Acute intertrochanteric fracture with mild displacement. Electronically Signed   By: Kathreen Devoid   On: 01/15/2016 15:42   Dg Chest Port 1 View  Result Date: 01/15/2016 CLINICAL DATA:  Patient had fall today and has pain of the right hip and right knee since fall and is unable to walk. Best images possible for patient condition at this time. EXAM: PORTABLE CHEST - 1 VIEW COMPARISON:  PET-CT 09/18/2015 and previous FINDINGS: Lungs are clear. Mild cardiomegaly. Atheromatous aorta. Stable right IJ port catheter  to the cavoatrial junction. No effusion. Visualized bones unremarkable. IMPRESSION: 1. Stable mild cardiomegaly.  No acute disease. 2. Aortic Atherosclerosis (ICD10-170.0) Electronically Signed   By: Lucrezia Europe M.D.   On: 01/15/2016 13:25   Dg Hip Unilat With Pelvis 2-3 Views Right  Result Date: 01/15/2016 CLINICAL DATA:  Fall today. Right hip pain and inability to ambulate. Initial encounter. EXAM: DG HIP (WITH OR WITHOUT PELVIS) 2-3V RIGHT COMPARISON:  None. FINDINGS: Mildly displaced intertrochanteric right hip fracture is seen. No evidence of dislocation. Mild right hip osteoarthritis and severe left hip osteoarthritis noted. No evidence of acetabular or pelvic fracture. IMPRESSION: Mildly displaced intertrochanteric  right hip fracture. Bilateral hip osteoarthritis, left side greater than right. Electronically Signed   By: Earle Gell M.D.   On: 01/15/2016 13:24    Positive ROS: All other systems have been reviewed and were otherwise negative with the exception of those mentioned in the HPI and as above.  Physical Exam: General: Alert, no acute distress Cardiovascular: No pedal edemaa Respiratory: No cyanosis, no use of accessory musculature GI: No organomegaly, abdomen is soft and non-tender Skin: No lesions in the area of chief complaint Neurologic: Sensation intact distally Psychiatric: Patient is competent for consent with normal mood and affect Lymphatic: No axillary or cervical lymphadenopathy  MUSCULOSKELETAL: Alert and oriented.  Pain controlled as long as he doesn't move.  Pain with rom of hip.  csm good and skin intact.     Assessment: Right hip fracture  Plan: ORIF right hip Sunday    Park Breed, MD [phone]   01/15/2016 10:42 PM

## 2016-01-15 NOTE — ED Provider Notes (Signed)
Time Seen: Approximately 1203  I have reviewed the triage notes  Chief Complaint: Knee Pain   History of Present Illness: Jeremy Gordon is a 75 y.o. male who has a significant history of metastatic esophageal cancer and has a history of atrial fibrillation and is on chronic Coumadin therapy. The patient had a non-syncopal fall today while working on his tractor in the garage. The patient's a difficult historian but states that the chair gave way and he landed primarily on his right hip region. He had trouble ambulating and had to crawl across the garage to get to a phone. He denies any head trauma. He points to an area just superior of his right knee as the source of discomfort.   Past Medical History:  Diagnosis Date  . Arthritis   . Atrial fibrillation ([place])   . COPD (chronic obstructive pulmonary disease) ([place])   . Dysrhythmia   . Esophageal carcinoma ([place])    a. Chemo: FOLFIRI  . Essential hypertension   . History of stress test    a. 06/2003 MV: EF 56%, no ischemia/infarct.  . Persistent atrial fibrillation ([place])    a. 07/2013 Echo: EF 60-65%, mod dil LA, mild TR;  CHA2DS2VASc = 3-->chronic coumadin.  . Sleep apnea     Patient Active Problem List   Diagnosis Date Noted  . Malignant neoplasm of lower third of esophagus ([place]) 09/10/2015  . Pneumonitis due to inhalation of food or vomitus ([place]) 08/24/2015  . Persistent atrial fibrillation ([place]) 01/29/2015  . Ventricular tachycardia ([place]) 01/29/2015  . Sepsis ([place]) 01/29/2015  . Essential hypertension 01/29/2015  . Hypokalemia 01/29/2015  . Hypomagnesemia 01/29/2015  . Systemic infection ([place]) 01/29/2015  . Atrial fibrillation ([place])   . Aspiration pneumonia of right lower lobe ([place])   . Pneumonia 01/26/2015  . PNA (pneumonia) 01/26/2015  . Chest wall pain 10/08/2014  . Chest pain 10/08/2014  . Dysphagia 02/05/2014  . Apnea, sleep 10/09/2013  . Carpal tunnel syndrome 03/11/2013  . Sciatica 11/23/2012  . Acid reflux  11/23/2012  . Gastro-esophageal reflux disease without esophagitis 11/23/2012  . 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 Enlarged prostate 10/15/2012  . Allergic rhinitis 08/15/2012  . Back ache 08/15/2012  . Atrial fibrillation, chronic ([place]) 08/15/2012  . CAFL (chronic airflow limitation) ([place]) 08/15/2012  . Breathlessness on exertion 08/15/2012  . BP (high blood pressure) 08/15/2012  . Malaise and fatigue 08/15/2012  . 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 Arthritis of knee, degenerative 08/15/2012  . 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 Lumbar canal stenosis 08/15/2012  . Chronic obstructive pulmonary disease ([place]) 08/15/2012  . Dorsalgia 08/15/2012  . Essential (primary) hypertension 08/15/2012  . Breath shortness 08/15/2012  . Does not feel right 08/15/2012  . Post viral syndrome 08/15/2012  . 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 ESOPHAGOGASTRODUODENOSCOPY (EGD) WITH PROPOFOL N/A 08/31/2015   Procedure: ESOPHAGOGASTRODUODENOSCOPY (EGD) WITH PROPOFOL;  Surgeon: Manya Silvas, MD;  Location: [facility] ENDOSCOPY;  Service: Endoscopy;  Laterality: N/A;  . HERNIA REPAIR    . PERIPHERAL VASCULAR CATHETERIZATION N/A 11/20/2014   Procedure: Glori Luis Cath Insertion;  Surgeon: Algernon Huxley, MD;  Location: [facility] CV LAB;  Service: Cardiovascular;  Laterality: N/A;    Past Surgical History:  Procedure Laterality Date  . ESOPHAGOGASTRODUODENOSCOPY (EGD) WITH PROPOFOL N/A 08/31/2015   Procedure: ESOPHAGOGASTRODUODENOSCOPY (EGD) WITH PROPOFOL;  Surgeon: Manya Silvas, MD;  Location: [facility]  ENDOSCOPY;  Service: Endoscopy;  Laterality: N/A;  . HERNIA REPAIR    . PERIPHERAL VASCULAR CATHETERIZATION N/A 11/20/2014   Procedure: Glori Luis Cath Insertion;  Surgeon: Algernon Huxley, MD;  Location: [facility] LAB;  Service: Cardiovascular;  Laterality: N/A;    Current Outpatient Rx  . Order #:  [id number] Class: Historical Med  . Order #: [id number] Class: Historical Med  . Order #: [id number] Class: Historical Med  . Order #: [id number] Class: Historical Med  . Order #: [id number] Class: Historical Med  . Order #: [id number] Class: Historical Med  . Order #: [id number] Class: Historical Med  . Order #: [id number] Class: Historical Med  . Order #: [id number] Class: Historical Med  . Order #: [id number] Class: Historical Med  . Order #: [id number] Class: Historical Med  . Order #: [id number] Class: Print  . Order #: [id number] Class: Normal  . Order #: [id number] Class: Print  . Order #: [id number] Class: Historical Med  . Order #: [id number] Class: Historical Med  . Order #: [id number] Class: Normal  . Order #: [id number] Class: Normal  . Order #: [id number] Class: Print  . Order #: [id number] Class: Normal  . Order #: [id number] Class: Historical Med  . Order #: [id number] Class: Historical Med    Allergies:  Review of patient's allergies indicates no known allergies.  Family History: Family History  Problem Relation Age of Onset  . Cancer Mother   . Stroke Father   . Hypertension Sister   . Hypertension Brother     Social History: Social History  Substance Use Topics  . Smoking status: Current Some Day Smoker    Years: 62.00  . Smokeless tobacco: Current User    Types: Snuff  . Alcohol use No     Review of Systems:   10 point review of systems was performed and was otherwise negative:  Constitutional: No fever Eyes: No visual disturbances ENT: No sore throat, ear pain Cardiac: No chest pain Respiratory: No shortness of breath, wheezing, or stridor Abdomen: No abdominal pain, no vomiting, No diarrhea Endocrine: No weight loss, No night sweats Extremities: No peripheral edema, cyanosis Skin: No rashes, easy bruising Neurologic: No focal weakness, trouble with speech or swollowing Urologic: No dysuria, Hematuria, or urinary frequency   Physical Exam:  ED Triage Vitals  Enc Vitals  Group     BP 01/15/16 1122 (!) 131/93     Pulse Rate 01/15/16 1122 89     Resp 01/15/16 1122 20     Temp 01/15/16 1122 97.9 F (36.6 C)     Temp Source 01/15/16 1122 Oral     SpO2 01/15/16 1122 93 %     Weight 01/15/16 1125 239 lb (108.4 kg)     Height 01/15/16 1125 5\' 7"  (1.702 m)     Head Circumference --      Peak Flow --      Pain Score 01/15/16 1125 10     Pain Loc --      Pain Edu? --      Excl. in [place]? --     General: Awake , Alert , and Oriented times 3; GCS 15 Head: Normal cephalic , atraumatic Eyes: Pupils equal , round, reactive to light Nose/Throat: No nasal drainage, patent upper airway without erythema or exudate.  Neck: Supple, Full range of motion, No anterior adenopathy or palpable thyroid masses Lungs: Clear to ascultation without wheezes , rhonchi, or rales Heart: Irregular rate, irregular rhythm without murmurs , gallops , or rubs Abdomen: Soft, non tender without rebound, guarding , or rigidity; bowel sounds positive and symmetric in all  4 quadrants. No organomegaly .        Extremities: Tender over the lateral surface of the right hip without crepitus or step-off noted. No obvious shortening or rotation of the right lower extremity. The extremity is neurovascularly intact. Neurologic: normal ambulation, Motor symmetric without deficits, sensory intact Skin: warm, dry, no rashes   Labs:   All laboratory work was reviewed including any pertinent negatives or positives listed below:  Labs Reviewed  CBC WITH DIFFERENTIAL/PLATELET - Abnormal; Notable for the following:       Result Value   WBC 14.7 (*)    RBC 4.30 (*)    HCT 38.8 (*)    RDW 19.0 (*)    Neutro Abs 12.4 (*)    Lymphs Abs 0.9 (*)    Monocytes Absolute 1.2 (*)    All other components within normal limits  COMPREHENSIVE METABOLIC PANEL - Abnormal; Notable for the following:    Glucose, Bld 104 (*)    BUN 31 (*)    Total Protein 5.7 (*)    Albumin 3.4 (*)    ALT 15 (*)    All other  components within normal limits  APTT - Abnormal; Notable for the following:    aPTT 46 (*)    All other components within normal limits  PROTIME-INR - Abnormal; Notable for the following:    Prothrombin Time 24.1 (*)    All other components within normal limits    EKG:  ED ECG REPORT I, Daymon Larsen, the attending physician, personally viewed and interpreted this ECG.  Date: 01/15/2016 EKG Time: 1359 Rate: 119 Rhythm: Atrial fibrillation with a left bundle branch block QRS Axis: normal Intervals: normal ST/T Wave abnormalities: normal Conduction Disturbances: none Narrative Interpretation: unremarkable No acute ischemic changes   Radiology: * "Dg Knee 2 Views Right  Result Date: 01/15/2016 CLINICAL DATA:  Fall today. Right knee pain and inability to walk. Initial encounter. EXAM: RIGHT KNEE - 1-2 VIEW COMPARISON:  None. FINDINGS: No evidence of fracture, dislocation, or joint effusion. Mild to moderate tricompartmental osteoarthritis. Generalized osteopenia. No focal lytic or sclerotic bone lesions. Peripheral vascular calcification. IMPRESSION: No acute findings. Mild to moderate tricompartmental osteoarthritis. Electronically Signed   By: Earle Gell M.D.   On: 01/15/2016 13:21   Dg Chest Port 1 View  Result Date: 01/15/2016 CLINICAL DATA:  Patient had fall today and has pain of the right hip and right knee since fall and is unable to walk. Best images possible for patient condition at this time. EXAM: PORTABLE CHEST - 1 VIEW COMPARISON:  PET-CT 09/18/2015 and previous FINDINGS: Lungs are clear. Mild cardiomegaly. Atheromatous aorta. Stable right IJ port catheter to the cavoatrial junction. No effusion. Visualized bones unremarkable. IMPRESSION: 1. Stable mild cardiomegaly.  No acute disease. 2. Aortic Atherosclerosis (ICD10-170.0) Electronically Signed   By: Lucrezia Europe M.D.   On: 01/15/2016 13:25   Dg Hip Unilat With Pelvis 2-3 Views Right  Result Date: 01/15/2016 CLINICAL  DATA:  Fall today. Right hip pain and inability to ambulate. Initial encounter. EXAM: DG HIP (WITH OR WITHOUT PELVIS) 2-3V RIGHT COMPARISON:  None. FINDINGS: Mildly displaced intertrochanteric right hip fracture is seen. No evidence of dislocation. Mild right hip osteoarthritis and severe left hip osteoarthritis noted. No evidence of acetabular or pelvic fracture. IMPRESSION: Mildly displaced intertrochanteric right hip fracture. Bilateral hip osteoarthritis, left side greater than right. Electronically Signed   By: Earle Gell M.D.   On: 01/15/2016 13:24  "  CT of the  right hip is pending  I personally reviewed the radiologic studies    ED Course:  Patient had an IV established and was given morphine for pain control. He appears to have either a intertrochanteric fracture or a trochanteric fracture of the right hip. Operative management may depend on the CAT scan of his right hip. The patient will require admission and pain control. He is on anticoagulation therapy. The patient's otherwise hemodynamically stable and does not appear to have suffered any other significant injury.  Clinical Course     Assessment: Right hip fracture   Final Clinical Impression:   Final diagnoses:  Trauma  Hip fracture, right, closed, initial encounter [facility])     Plan:  Inpatient            Daymon Larsen, MD 01/15/16 1419

## 2016-01-15 NOTE — ED Triage Notes (Signed)
Pt arrived via EMS from home. Pt reports a chair broke under him this morning and he hurt his right knee. Pt denies LOC, denies hitting head, denies dizziness prior to fall. EMS reports VSS, CBG 107.

## 2016-01-15 NOTE — H&P (Addendum)
[facility] NAME: Jeremy Gordon    MR#:  [id number]  DATE OF BIRTH:  Sep 04, 1940  DATE OF ADMISSION:  01/15/2016  PRIMARY CARE PHYSICIAN: [name], MD   REQUESTING/REFERRING PHYSICIAN: Daymon Larsen, MD  CHIEF COMPLAINT:   Chief Complaint  Patient presents with  . Knee Pain   Right knee pain after fall today. HISTORY OF PRESENT ILLNESS:  Aldren Bowar  is a 75 y.o. male with a known history of Hypertension, A. fib, COPD and esophageal cancer. The patient was sent to the ED due to fall and the right lower extremity pain today. He denies any syncope episode, loss of consciousness or other injuries. X-ray show right hip fracture. Dr. Sabra Heck, orthopedic surgeons, suggest the CAT scan of right hip for further evaluation.  PAST MEDICAL HISTORY:   Past Medical History:  Diagnosis Date  . Arthritis   . Atrial fibrillation ([place])   . COPD (chronic obstructive pulmonary disease) ([place])   . Dysrhythmia   . Esophageal carcinoma ([place])    a. Chemo: FOLFIRI  . Essential hypertension   . History of stress test    a. 06/2003 MV: EF 56%, no ischemia/infarct.  . Persistent atrial fibrillation ([place])    a. 07/2013 Echo: EF 60-65%, mod dil LA, mild TR;  CHA2DS2VASc = 3-->chronic coumadin.  . Sleep apnea     PAST SURGICAL HISTORY:   Past Surgical History:  Procedure Laterality Date  . ESOPHAGOGASTRODUODENOSCOPY (EGD) WITH PROPOFOL N/A 08/31/2015   Procedure: ESOPHAGOGASTRODUODENOSCOPY (EGD) WITH PROPOFOL;  Surgeon: Manya Silvas, MD;  Location: [facility] ENDOSCOPY;  Service: Endoscopy;  Laterality: N/A;  . HERNIA REPAIR    . PERIPHERAL VASCULAR CATHETERIZATION N/A 11/20/2014   Procedure: Glori Luis Cath Insertion;  Surgeon: Algernon Huxley, MD;  Location: [facility] LAB;  Service: Cardiovascular;  Laterality: N/A;    SOCIAL HISTORY:   Social History  Substance Use Topics  . Smoking status: Current Some Day Smoker    Years: 62.00  .  Smokeless tobacco: Current User    Types: Snuff  . Alcohol use No   The patient denies any smoking. FAMILY HISTORY:   Family History  Problem Relation Age of Onset  . Cancer Mother   . Stroke Father   . Hypertension Sister   . Hypertension Brother     DRUG ALLERGIES:  No Known Allergies  REVIEW OF SYSTEMS:   Review of Systems  Constitutional: Positive for malaise/fatigue. Negative for chills and fever.  HENT: Negative for sore throat.   Eyes: Negative for blurred vision and double vision.  Respiratory: Negative for cough, shortness of breath, wheezing and stridor.   Cardiovascular: Negative for chest pain and leg swelling.  Gastrointestinal: Negative for abdominal pain, blood in stool, melena, nausea and vomiting.  Genitourinary: Negative for dysuria, frequency, hematuria and urgency.  Musculoskeletal: Positive for falls and joint pain.  Skin: Negative for itching and rash.  Neurological: Negative for dizziness, focal weakness, seizures, loss of consciousness and headaches.  Psychiatric/Behavioral: Negative for depression. The patient is not nervous/anxious.     MEDICATIONS AT HOME:   Prior to Admission medications   Medication Sig Start Date End Date Taking? Authorizing Provider  digoxin (LANOXIN) 0.125 MG tablet Take by mouth. 02/02/15  Yes Historical Provider, MD  diltiazem (CARDIZEM CD) 120 MG 24 hr capsule  11/09/15  Yes Historical Provider, MD  HYDROcodone-acetaminophen (NORCO/VICODIN) 5-325 MG tablet Take 1 tablet by mouth every 6 (six) hours as  needed for moderate pain.   Yes Historical Provider, MD  metoprolol (LOPRESSOR) 50 MG tablet Take 50 mg by mouth. 07/30/15 07/29/16 Yes Historical Provider, MD  warfarin (COUMADIN) 4 MG tablet Take 2 tablets by mouth with evening meal 07/27/15  Yes Historical Provider, MD  albuterol (PROVENTIL HFA;VENTOLIN HFA) 108 (90 Base) MCG/ACT inhaler Inhale into the lungs. 08/28/15 08/27/16  Historical Provider, MD    chlorpheniramine-HYDROcodone ([place]) 10-8 MG/5ML SUER TAKE 5ML BY MOUTH EVERY TWELVE HOURS AS NEEDED FOR COUGH 08/13/15   Historical Provider, MD  diphenoxylate-atropine (LOMOTIL) 2.5-0.025 MG tablet Take by mouth. 06/10/15   Historical Provider, MD  enalapril (VASOTEC) 20 MG tablet Take by mouth.    Historical Provider, MD  finasteride (PROSCAR) 5 MG tablet Take 5 mg by mouth daily.    Historical Provider, MD  fluticasone (FLONASE) 50 MCG/ACT nasal spray ONE SPRAY IN EACH NOSTRIL AT NIGHT AS DIRECTED 07/01/15   Historical Provider, MD  fluticasone (FLOVENT HFA) 220 MCG/ACT inhaler Inhale into the lungs. 08/28/15 08/27/16  Historical Provider, MD  furosemide (LASIX) 20 MG tablet Take 20 mg by mouth. 09/05/14   Historical Provider, MD  gabapentin (NEURONTIN) 300 MG capsule Take 300 mg by mouth. 07/07/15   Historical Provider, MD  HYDROcodone-acetaminophen (NORCO) 10-325 MG tablet Take 1 tablet by mouth every 6 (six) hours as needed. Patient not taking: Reported on 01/15/2016 12/01/15   Lloyd Huger, MD  levofloxacin (LEVAQUIN) 500 MG tablet Take 1 tablet (500 mg total) by mouth daily. 01/08/16   Norval Gable, MD  magic mouthwash SOLN Take 5-10 mLs by mouth 4 (four) times daily. 09/29/15   Lloyd Huger, MD  nystatin (MYCOSTATIN) 100000 UNIT/ML suspension  09/30/15   Historical Provider, MD  omeprazole (PRILOSEC) 20 MG capsule Take 1 capsule (20 mg total) by mouth daily. 12/03/15   Lloyd Huger, MD  predniSONE (DELTASONE) 20 MG tablet Take 1 tablet (20 mg total) by mouth daily. 01/08/16   Norval Gable, MD  prochlorperazine (COMPAZINE) 10 MG tablet Take 1 tablet (10 mg total) by mouth every 6 (six) hours as needed for nausea or vomiting. 11/13/15   Lloyd Huger, MD  sucralfate (CARAFATE) 1 GM/10ML suspension Take 10 mLs (1 g total) by mouth 4 (four) times daily -  with meals and at bedtime. 09/29/15   Lloyd Huger, MD  tamsulosin (FLOMAX) 0.4 MG CAPS capsule Take 0.4 mg by mouth  daily. 08/25/15   Historical Provider, MD      VITAL SIGNS:  Blood pressure 107/61, pulse 73, temperature 97.9 F (36.6 C), temperature source Oral, resp. rate 20, height 5\' 7"  (1.702 m), weight 239 lb (108.4 kg), SpO2 96 %.  PHYSICAL EXAMINATION:  Physical Exam  GENERAL:  75 y.o.-year-old patient lying in the bed with no acute distress. Morbidly obese. EYES: Pupils equal, round, reactive to light and accommodation. No scleral icterus. Extraocular muscles intact.  HEENT: Head atraumatic, normocephalic. Oropharynx and nasopharynx clear.  NECK:  Supple, no jugular venous distention. No thyroid enlargement, no tenderness.  LUNGS: Normal breath sounds bilaterally, no wheezing, rales,rhonchi or crepitation. No use of accessory muscles of respiration.  CARDIOVASCULAR: S1, S2 normal. No murmurs, rubs, or gallops.  ABDOMEN: Soft, nontender, nondistended. Bowel sounds present. No organomegaly or mass.  EXTREMITIES: No pedal edema, cyanosis, or clubbing. Unable to move right lower extremity. NEUROLOGIC: Cranial nerves II through XII are intact. Muscle strength 5/5 in all extremities. Sensation intact. Gait not checked.  PSYCHIATRIC: The patient is alert and oriented  x 3.  SKIN: No obvious rash, lesion, or ulcer. Many bruises on both arms.  LABORATORY PANEL:   CBC  Recent Labs Lab 01/15/16 1333  WBC 14.7*  HGB 13.1  HCT 38.8*  PLT 194   ------------------------------------------------------------------------------------------------------------------  Chemistries   Recent Labs Lab 01/15/16 1333  NA 139  K 3.5  CL 105  CO2 28  GLUCOSE 104*  BUN 31*  CREATININE 1.10  CALCIUM 8.9  AST 21  ALT 15*  ALKPHOS 59  BILITOT 1.0   ------------------------------------------------------------------------------------------------------------------  Cardiac Enzymes No results for input(s): TROPONINI in the last 168  hours. ------------------------------------------------------------------------------------------------------------------  RADIOLOGY:  Dg Knee 2 Views Right  Result Date: 01/15/2016 CLINICAL DATA:  Fall today. Right knee pain and inability to walk. Initial encounter. EXAM: RIGHT KNEE - 1-2 VIEW COMPARISON:  None. FINDINGS: No evidence of fracture, dislocation, or joint effusion. Mild to moderate tricompartmental osteoarthritis. Generalized osteopenia. No focal lytic or sclerotic bone lesions. Peripheral vascular calcification. IMPRESSION: No acute findings. Mild to moderate tricompartmental osteoarthritis. Electronically Signed   By: Earle Gell M.D.   On: 01/15/2016 13:21   Dg Chest Port 1 View  Result Date: 01/15/2016 CLINICAL DATA:  Patient had fall today and has pain of the right hip and right knee since fall and is unable to walk. Best images possible for patient condition at this time. EXAM: PORTABLE CHEST - 1 VIEW COMPARISON:  PET-CT 09/18/2015 and previous FINDINGS: Lungs are clear. Mild cardiomegaly. Atheromatous aorta. Stable right IJ port catheter to the cavoatrial junction. No effusion. Visualized bones unremarkable. IMPRESSION: 1. Stable mild cardiomegaly.  No acute disease. 2. Aortic Atherosclerosis (ICD10-170.0) Electronically Signed   By: Lucrezia Europe M.D.   On: 01/15/2016 13:25   Dg Hip Unilat With Pelvis 2-3 Views Right  Result Date: 01/15/2016 CLINICAL DATA:  Fall today. Right hip pain and inability to ambulate. Initial encounter. EXAM: DG HIP (WITH OR WITHOUT PELVIS) 2-3V RIGHT COMPARISON:  None. FINDINGS: Mildly displaced intertrochanteric right hip fracture is seen. No evidence of dislocation. Mild right hip osteoarthritis and severe left hip osteoarthritis noted. No evidence of acetabular or pelvic fracture. IMPRESSION: Mildly displaced intertrochanteric right hip fracture. Bilateral hip osteoarthritis, left side greater than right. Electronically Signed   By: Earle Gell M.D.    On: 01/15/2016 13:24      IMPRESSION AND PLAN:   Right hip fracture Pain control, hold Coumadin, follow-up with Dr. Loreta Ave for possible surgery. Pain control.  A. fib with RVR. HR 110-120. Hold Coumadin for possible surgery, continue digoxin, Cardizem and Lopressor. Cardiology consult for clearance. Follow-up INR.  Dehydration. Hold Lasix, start normal saline IV and follow-up BMP. Encouraged oral intake.  Leukocytosis. Possible due to reaction due to hip fracture. Follow-up urinalysis.  Esophagus cancer. No dysphagia or chest pain. Follow-up Dr. Grayland Ormond as outpatient.  COPD. Stable continue home nebulizer and DuoNeb when necessary.   All the records are reviewed and case discussed with ED provider. Management plans discussed with the patient, family and they are in agreement.  CODE STATUS: Full code  TOTAL TIME TAKING CARE OF THIS PATIENT: 62 minutes.    Demetrios Loll M.D on 01/15/2016 at 3:34 PM  Between 7am to 6pm - Pager - [phone]  After 6pm go to [email or web address] - Proofreader  [employer] [facility]  Office  [phone]  CC: Primary care physician; [name], MD   Note: This dictation was prepared with Dragon dictation along with smaller phrase technology. Any transcriptional errors that result from  this process are unintentional.

## 2016-01-16 LAB — BASIC METABOLIC PANEL
ANION GAP: 4 — AB (ref 5–15)
BUN: 27 mg/dL — ABNORMAL HIGH (ref 6–20)
CALCIUM: 8.6 mg/dL — AB (ref 8.9–10.3)
CO2: 28 mmol/L (ref 22–32)
Chloride: 106 mmol/L (ref 101–111)
Creatinine, Ser: 0.94 mg/dL (ref 0.61–1.24)
Glucose, Bld: 122 mg/dL — ABNORMAL HIGH (ref 65–99)
POTASSIUM: 4 mmol/L (ref 3.5–5.1)
Sodium: 138 mmol/L (ref 135–145)

## 2016-01-16 LAB — PROTIME-INR
INR: 2.17
INR: 1.66
PROTHROMBIN TIME: 24.5 s — AB (ref 11.4–15.2)
Prothrombin Time: 19.8 seconds — ABNORMAL HIGH (ref 11.4–15.2)

## 2016-01-16 LAB — CBC
HCT: 39 % — ABNORMAL LOW (ref 40.0–52.0)
HEMOGLOBIN: 13.2 g/dL (ref 13.0–18.0)
MCH: 30.5 pg (ref 26.0–34.0)
MCHC: 33.8 g/dL (ref 32.0–36.0)
MCV: 90.1 fL (ref 80.0–100.0)
Platelets: 156 10*3/uL (ref 150–440)
RBC: 4.32 MIL/uL — AB (ref 4.40–5.90)
RDW: 18.4 % — ABNORMAL HIGH (ref 11.5–14.5)
WBC: 9.4 10*3/uL (ref 3.8–10.6)

## 2016-01-16 LAB — ECHOCARDIOGRAM COMPLETE
HEIGHTINCHES: 67 in
WEIGHTICAEL: 3824 [oz_av]

## 2016-01-16 MED ORDER — CLINDAMYCIN PHOSPHATE 600 MG/50ML IV SOLN
600.0000 mg | Freq: Once | INTRAVENOUS | Status: AC
  Administered 2016-01-17: 600 mg via INTRAVENOUS

## 2016-01-16 MED ORDER — VITAMIN K1 10 MG/ML IJ SOLN
10.0000 mg | Freq: Once | INTRAMUSCULAR | Status: AC
  Administered 2016-01-16: 10 mg via SUBCUTANEOUS
  Filled 2016-01-16: qty 1

## 2016-01-16 MED ORDER — CEFAZOLIN SODIUM-DEXTROSE 2-4 GM/100ML-% IV SOLN
2.0000 g | Freq: Once | INTRAVENOUS | Status: AC
Start: 2016-01-17 — End: 2016-01-17
  Administered 2016-01-17: 2 g via INTRAVENOUS
  Filled 2016-01-16: qty 100

## 2016-01-16 NOTE — Progress Notes (Signed)
Pt is alert and oriented. No complaints of pain during the night. Remaining A-fib on the telemetry monitor.

## 2016-01-16 NOTE — Progress Notes (Signed)
Patient ID: OVIDE CALANDRA, male   DOB: 07-05-1940, 75 y.o.   MRN: [id number]  [facility] PROGRESS NOTE  VENUS MORIARITY [id number] DOB: 26-Jul-1940 DOA: 01/15/2016 PCP: [name], MD  HPI/Subjective: Patient with right hip pain. No complaints of shortness breath or chest pain.  Objective: Vitals:   01/16/16 1144 01/16/16 1449  BP: 135/86 110/64  Pulse: (!) 58 88  Resp:    Temp: 97.6 F (36.4 C) 98 F (36.7 C)    Filed Weights   01/15/16 1125  Weight: 108.4 kg (239 lb)    ROS: Review of Systems  Constitutional: Negative for chills and fever.  Eyes: Negative for blurred vision.  Respiratory: Negative for cough and shortness of breath.   Cardiovascular: Negative for chest pain.  Gastrointestinal: Negative for abdominal pain, constipation, diarrhea, nausea and vomiting.  Genitourinary: Negative for dysuria.  Musculoskeletal: Positive for joint pain.  Neurological: Negative for dizziness and headaches.   Exam: Physical Exam  Constitutional: He is oriented to person, place, and time.  HENT:  Nose: No mucosal edema.  Mouth/Throat: No oropharyngeal exudate or posterior oropharyngeal edema.  Eyes: Conjunctivae, EOM and lids are normal. Pupils are equal, round, and reactive to light.  Neck: No JVD present. Carotid bruit is not present. No edema present. No thyroid mass and no thyromegaly present.  Cardiovascular: S1 normal and S2 normal.  An irregularly irregular rhythm present. Exam reveals no gallop.   No murmur heard. Pulses:      Dorsalis pedis pulses are 2+ on the right side, and 2+ on the left side.  Respiratory: No respiratory distress. He has no wheezes. He has no rhonchi. He has no rales.  GI: Soft. Bowel sounds are normal. There is no tenderness.  Musculoskeletal:       Right ankle: He exhibits swelling.       Left ankle: He exhibits swelling.  Lymphadenopathy:    He has no cervical adenopathy.  Neurological: He is alert and oriented to person, place,  and time. No cranial nerve deficit.  Skin: Skin is warm. No rash noted. Nails show no clubbing.  Psychiatric: He has a normal mood and affect.      Data Reviewed: Basic Metabolic Panel:  Recent Labs Lab 01/15/16 1333 01/16/16 0355  NA 139 138  K 3.5 4.0  CL 105 106  CO2 28 28  GLUCOSE 104* 122*  BUN 31* 27*  CREATININE 1.10 0.94  CALCIUM 8.9 8.6*   Liver Function Tests:  Recent Labs Lab 01/15/16 1333  AST 21  ALT 15*  ALKPHOS 59  BILITOT 1.0  PROT 5.7*  ALBUMIN 3.4*   CBC:  Recent Labs Lab 01/15/16 1333 01/16/16 0355  WBC 14.7* 9.4  NEUTROABS 12.4*  --   HGB 13.1 13.2  HCT 38.8* 39.0*  MCV 90.2 90.1  PLT 194 156     Recent Results (from the past 240 hour(s))  Surgical pcr screen     Status: None   Collection Time: 01/15/16  4:11 PM  Result Value Ref Range Status   MRSA, PCR NEGATIVE NEGATIVE Final   Staphylococcus aureus NEGATIVE NEGATIVE Final    Comment:        The Xpert SA Assay (FDA approved for NASAL specimens in patients over 9 years of age), is one component of a comprehensive surveillance program.  Test performance has been validated by [facility] for patients greater than or equal to 73 year old. It is not intended to diagnose infection nor to guide  or monitor treatment.      Studies: Dg Knee 2 Views Right  Result Date: 01/15/2016 CLINICAL DATA:  Fall today. Right knee pain and inability to walk. Initial encounter. EXAM: RIGHT KNEE - 1-2 VIEW COMPARISON:  None. FINDINGS: No evidence of fracture, dislocation, or joint effusion. Mild to moderate tricompartmental osteoarthritis. Generalized osteopenia. No focal lytic or sclerotic bone lesions. Peripheral vascular calcification. IMPRESSION: No acute findings. Mild to moderate tricompartmental osteoarthritis. Electronically Signed   By: Earle Gell M.D.   On: 01/15/2016 13:21   Ct Hip Right Wo Contrast  Result Date: 01/15/2016 CLINICAL DATA:  Status post fall.  Right hip pain. EXAM:  CT OF THE RIGHT HIP WITHOUT CONTRAST TECHNIQUE: Multidetector CT imaging of the right hip was performed according to the standard protocol. Multiplanar CT image reconstructions were also generated. COMPARISON:  None. FINDINGS: Bones/Joint/Cartilage Severe osteopenia. Impacted nondisplaced right subcapital fracture. Additionally, acute intertrochanteric fracture with mild displacement. Mild osteoarthritis of the right hip. Mild osteoarthritis of the right sacroiliac joint. No aggressive lytic or sclerotic osseous lesion. Ligaments Suboptimally assessed by CT. Muscles and Tendons Muscles are normal.  No intramuscular hematoma or fluid collection. Soft tissues No fluid collection or hematoma. Peripheral vascular atherosclerotic disease. IMPRESSION: 1. Acute, impacted nondisplaced right subcapital fracture. Acute intertrochanteric fracture with mild displacement. Electronically Signed   By: Kathreen Devoid   On: 01/15/2016 15:42   Dg Chest Port 1 View  Result Date: 01/15/2016 CLINICAL DATA:  Patient had fall today and has pain of the right hip and right knee since fall and is unable to walk. Best images possible for patient condition at this time. EXAM: PORTABLE CHEST - 1 VIEW COMPARISON:  PET-CT 09/18/2015 and previous FINDINGS: Lungs are clear. Mild cardiomegaly. Atheromatous aorta. Stable right IJ port catheter to the cavoatrial junction. No effusion. Visualized bones unremarkable. IMPRESSION: 1. Stable mild cardiomegaly.  No acute disease. 2. Aortic Atherosclerosis (ICD10-170.0) Electronically Signed   By: Lucrezia Europe M.D.   On: 01/15/2016 13:25   Dg Hip Unilat With Pelvis 2-3 Views Right  Result Date: 01/15/2016 CLINICAL DATA:  Fall today. Right hip pain and inability to ambulate. Initial encounter. EXAM: DG HIP (WITH OR WITHOUT PELVIS) 2-3V RIGHT COMPARISON:  None. FINDINGS: Mildly displaced intertrochanteric right hip fracture is seen. No evidence of dislocation. Mild right hip osteoarthritis and severe left  hip osteoarthritis noted. No evidence of acetabular or pelvic fracture. IMPRESSION: Mildly displaced intertrochanteric right hip fracture. Bilateral hip osteoarthritis, left side greater than right. Electronically Signed   By: Earle Gell M.D.   On: 01/15/2016 13:24    Scheduled Meds: . [START ON 01/17/2016]  ceFAZolin (ANCEF) IV  2 g Intravenous Once  . clindamycin (CLEOCIN) IV  600 mg Intravenous Once  . digoxin  0.125 mg Oral Daily  . diltiazem  120 mg Oral Daily  . finasteride  5 mg Oral Daily  . metoprolol  50 mg Oral BID  . pantoprazole  40 mg Oral Daily  . predniSONE  20 mg Oral Daily  . sodium chloride flush  3 mL Intravenous Q12H  . sucralfate  1 g Oral TID WC & HS  . tamsulosin  0.4 mg Oral Daily   Continuous Infusions: . sodium chloride 40 mL/hr at 01/16/16 1157    Assessment/Plan:  1. Closed right hip fracture initial evaluation. Pain control. Coumadin on hold and vitamin K given. Hopefully can undergo surgery tomorrow. 2. Atrial fibrillation. Continue digoxin and diltiazem and metoprolol for heart rate control. Check an  INR again tomorrow morning. Can give FFP if INR still high. Patient received vitamin K already. 3. Dehydration. Decreased level of IV fluids. Lasix on hold but may have to restart. 4. Leukocytosis reactive. Improved after IV fluids. 5. COPD respiratory status stable. 6. History of esophageal cancer. Follow-up with Dr. Grayland Ormond as outpatient 7. BPH on finasteride and Flomax 8. GERD on Carafate and Protonix  Code Status:     Code Status Orders        Start     Ordered   01/15/16 1537  Full code  Continuous     01/15/16 1536    Code Status History    Date Active Date Inactive Code Status Order ID Comments User Context   01/15/2016  3:36 PM 01/15/2016 10:25 PM Full Code [id number]  Demetrios Loll, MD Inpatient   01/26/2015  6:21 PM 02/02/2015  6:18 PM Full Code [id number]  Vaughan Basta, MD ED     Disposition Plan: Third day postoperatively  likely out to rehabilitation. Patient wants to go home though.  Consultants:  Orthopedic surgery  Time spent: 25 minutes  [place], [place]

## 2016-01-16 NOTE — Progress Notes (Signed)
SUBJECTIVE: Patient has complained of right hip pain no chest pain   Vitals:   01/15/16 1916 01/16/16 0109 01/16/16 0355 01/16/16 0810  BP: 105/67 135/87 (!) 131/98 (!) 141/87  Pulse: 92 97 74 95  Resp:  18 20 18   Temp: 97.7 F (36.5 C) 98.7 F (37.1 C) 98.2 F (36.8 C) 98.3 F (36.8 C)  TempSrc: Oral Oral Oral Oral  SpO2: 95% 94% 95% 95%  Weight:      Height:        Intake/Output Summary (Last 24 hours) at 01/16/16 1047 Last data filed at 01/16/16 0548  Gross per 24 hour  Intake          1231.25 ml  Output                0 ml  Net          1231.25 ml    LABS: Basic Metabolic Panel:  Recent Labs  01/15/16 1333 01/16/16 0355  NA 139 138  K 3.5 4.0  CL 105 106  CO2 28 28  GLUCOSE 104* 122*  BUN 31* 27*  CREATININE 1.10 0.94  CALCIUM 8.9 8.6*   Liver Function Tests:  Recent Labs  01/15/16 1333  AST 21  ALT 15*  ALKPHOS 59  BILITOT 1.0  PROT 5.7*  ALBUMIN 3.4*   No results for input(s): LIPASE, AMYLASE in the last 72 hours. CBC:  Recent Labs  01/15/16 1333 01/16/16 0355  WBC 14.7* 9.4  NEUTROABS 12.4*  --   HGB 13.1 13.2  HCT 38.8* 39.0*  MCV 90.2 90.1  PLT 194 156   Cardiac Enzymes: No results for input(s): CKTOTAL, CKMB, CKMBINDEX, TROPONINI in the last 72 hours. BNP: Invalid input(s): POCBNP D-Dimer: No results for input(s): DDIMER in the last 72 hours. Hemoglobin A1C: No results for input(s): HGBA1C in the last 72 hours. Fasting Lipid Panel: No results for input(s): CHOL, HDL, LDLCALC, TRIG, CHOLHDL, LDLDIRECT in the last 72 hours. Thyroid Function Tests: No results for input(s): TSH, T4TOTAL, T3FREE, THYROIDAB in the last 72 hours.  Invalid input(s): FREET3 Anemia Panel: No results for input(s): VITAMINB12, FOLATE, FERRITIN, TIBC, IRON, RETICCTPCT in the last 72 hours.   PHYSICAL EXAM General: Well developed, well nourished, in no acute distress HEENT:  Normocephalic and atramatic Neck:  No JVD.  Lungs: Clear bilaterally to  auscultation and percussion. Heart: HRRR . Normal S1 and S2 without gallops or murmurs.  Abdomen: Bowel sounds are positive, abdomen soft and non-tender  Msk:  Back normal, normal gait. Normal strength and tone for age. Extremities: No clubbing, cyanosis or edema.   Neuro: Alert and oriented X 3. Psych:  Good affect, responds appropriately  TELEMETRY:A. fib with controlled rate  ASSESSMENT AND PLAN: Atrial fibrillation which is chronic with history of fall and right hip fracture needing surgery. Patient denies any chest pain or shortness of breath thus advise proceeding with surgery with low to moderate cardiac risk.  Active Problems:   Closed right hip fracture (HCC)    Neoma Laming A, MD, [facility] 01/16/2016 10:47 AM

## 2016-01-16 NOTE — Progress Notes (Signed)
Subjective:   Procedure(s) (LRB): INTRAMEDULLARY (IM) NAIL INTERTROCHANTRIC (Right)    Patient reports pain as mild.  Objective:  Alert and oriented.  csm good.  Pian with motion of hip  VITALS:   Vitals:   01/16/16 0810 01/16/16 1144  BP: (!) 141/87 135/86  Pulse: 95 (!) 58  Resp: 18   Temp: 98.3 F (36.8 C) 97.6 F (36.4 C)    Neurologically intact ABD soft Neurovascular intact Sensation intact distally Intact pulses distally Dorsiflexion/Plantar flexion intact  LABS  Recent Labs  01/15/16 1333 01/16/16 0355  HGB 13.1 13.2  HCT 38.8* 39.0*  WBC 14.7* 9.4  PLT 194 156     Recent Labs  01/15/16 1333 01/16/16 0355  NA 139 138  K 3.5 4.0  BUN 31* 27*  CREATININE 1.10 0.94  GLUCOSE 104* 122*     Recent Labs  01/15/16 1333 01/16/16 0355  INR 2.12 2.17     Assessment/Plan:   Procedure(s) (LRB): INTRAMEDULLARY (IM) NAIL INTERTROCHANTRIC (Right)  Plan surgery tomorrow AM

## 2016-01-17 ENCOUNTER — Encounter
Admission: EM | Disposition: A | Discharge status: Home Health | Payer: Self-pay | Source: Home / Self Care | Attending: Internal Medicine

## 2016-01-17 ENCOUNTER — Inpatient Hospital Stay: Payer: Medicare Other

## 2016-01-17 ENCOUNTER — Encounter: Payer: Self-pay | Admitting: Anesthesiology

## 2016-01-17 ENCOUNTER — Inpatient Hospital Stay: Payer: Medicare Other | Admitting: Certified Registered Nurse Anesthetist

## 2016-01-17 HISTORY — PX: FEMUR IM NAIL: SHX1597

## 2016-01-17 LAB — CBC
HCT: 39.4 % — ABNORMAL LOW (ref 40.0–52.0)
HEMOGLOBIN: 13 g/dL (ref 13.0–18.0)
MCH: 30.2 pg (ref 26.0–34.0)
MCHC: 33 g/dL (ref 32.0–36.0)
MCV: 91.4 fL (ref 80.0–100.0)
PLATELETS: 139 10*3/uL — AB (ref 150–440)
RBC: 4.31 MIL/uL — AB (ref 4.40–5.90)
RDW: 18.4 % — ABNORMAL HIGH (ref 11.5–14.5)
WBC: 8.2 10*3/uL (ref 3.8–10.6)

## 2016-01-17 LAB — CREATININE, SERUM: CREATININE: 0.82 mg/dL (ref 0.61–1.24)

## 2016-01-17 LAB — PROTIME-INR
INR: 1.41
Prothrombin Time: 17.4 seconds — ABNORMAL HIGH (ref 11.4–15.2)

## 2016-01-17 LAB — URINE CULTURE: CULTURE: NO GROWTH

## 2016-01-17 SURGERY — INSERTION, INTRAMEDULLARY ROD, FEMUR
Anesthesia: General | Laterality: Right

## 2016-01-17 MED ORDER — SUCCINYLCHOLINE CHLORIDE 20 MG/ML IJ SOLN
INTRAMUSCULAR | Status: DC | PRN
  Administered 2016-01-17: 100 mg via INTRAVENOUS

## 2016-01-17 MED ORDER — ENALAPRIL MALEATE 5 MG PO TABS
2.5000 mg | ORAL_TABLET | Freq: Every day | ORAL | Status: DC
Start: 2016-01-17 — End: 2016-01-19
  Administered 2016-01-17 – 2016-01-19 (×3): 2.5 mg via ORAL
  Filled 2016-01-17 (×3): qty 1

## 2016-01-17 MED ORDER — ACETAMINOPHEN 325 MG PO TABS: 650.0000 mg | ORAL_TABLET | Freq: Four times a day (QID) | ORAL | Status: DC | PRN

## 2016-01-17 MED ORDER — NEOMYCIN-POLYMYXIN B GU 40-200000 IR SOLN
Status: DC | PRN
  Administered 2016-01-17: 2 mL

## 2016-01-17 MED ORDER — HYDROMORPHONE HCL 1 MG/ML IJ SOLN
INTRAMUSCULAR | Status: DC | PRN
  Administered 2016-01-17 (×2): .25 mg via INTRAVENOUS

## 2016-01-17 MED ORDER — ONDANSETRON HCL 4 MG PO TABS: 4.0000 mg | ORAL_TABLET | Freq: Four times a day (QID) | ORAL | Status: DC | PRN

## 2016-01-17 MED ORDER — ENOXAPARIN SODIUM 30 MG/0.3ML ~~LOC~~ SOLN: 30.0000 mg | SUBCUTANEOUS | Status: DC

## 2016-01-17 MED ORDER — ZOLPIDEM TARTRATE 5 MG PO TABS: 5.0000 mg | ORAL_TABLET | Freq: Every evening | ORAL | Status: DC | PRN

## 2016-01-17 MED ORDER — ROCURONIUM BROMIDE 100 MG/10ML IV SOLN
INTRAVENOUS | Status: DC | PRN
  Administered 2016-01-17: 20 mg via INTRAVENOUS

## 2016-01-17 MED ORDER — MAGIC MOUTHWASH: 5.0000 mL | Freq: Four times a day (QID) | ORAL | Status: DC

## 2016-01-17 MED ORDER — HYDROCODONE-ACETAMINOPHEN 5-325 MG PO TABS
1.0000 | ORAL_TABLET | Freq: Four times a day (QID) | ORAL | Status: DC | PRN
  Administered 2016-01-19: 1 via ORAL
  Filled 2016-01-17: qty 1

## 2016-01-17 MED ORDER — SODIUM CHLORIDE 0.9 % IV SOLN
INTRAVENOUS | Status: DC | PRN
  Administered 2016-01-17: 50 ug/min via INTRAVENOUS

## 2016-01-17 MED ORDER — DEXAMETHASONE SODIUM PHOSPHATE 10 MG/ML IJ SOLN
INTRAMUSCULAR | Status: DC | PRN
  Administered 2016-01-17: 10 mg via INTRAVENOUS

## 2016-01-17 MED ORDER — ACETAMINOPHEN 650 MG RE SUPP: 650.0000 mg | Freq: Four times a day (QID) | RECTAL | Status: DC | PRN

## 2016-01-17 MED ORDER — METOCLOPRAMIDE HCL 10 MG PO TABS: 5.0000 mg | ORAL_TABLET | Freq: Three times a day (TID) | ORAL | Status: DC | PRN

## 2016-01-17 MED ORDER — WARFARIN SODIUM 4 MG PO TABS: 8.0000 mg | ORAL_TABLET | Freq: Every day | ORAL | Status: DC

## 2016-01-17 MED ORDER — WARFARIN SODIUM 5 MG PO TABS
10.0000 mg | ORAL_TABLET | Freq: Once | ORAL | Status: AC
  Administered 2016-01-17: 10 mg via ORAL
  Filled 2016-01-17: qty 2

## 2016-01-17 MED ORDER — FENTANYL CITRATE (PF) 100 MCG/2ML IJ SOLN
INTRAMUSCULAR | Status: DC | PRN
  Administered 2016-01-17 (×2): 50 ug via INTRAVENOUS

## 2016-01-17 MED ORDER — CEFAZOLIN SODIUM-DEXTROSE 2-4 GM/100ML-% IV SOLN
2.0000 g | Freq: Four times a day (QID) | INTRAVENOUS | Status: AC
  Administered 2016-01-17 – 2016-01-18 (×3): 2 g via INTRAVENOUS
  Filled 2016-01-17 (×3): qty 100

## 2016-01-17 MED ORDER — MENTHOL 3 MG MT LOZG
1.0000 | LOZENGE | OROMUCOSAL | Status: DC | PRN
  Filled 2016-01-17: qty 9

## 2016-01-17 MED ORDER — SENNA 8.6 MG PO TABS
1.0000 | ORAL_TABLET | Freq: Two times a day (BID) | ORAL | Status: DC
  Administered 2016-01-17 – 2016-01-19 (×4): 8.6 mg via ORAL
  Filled 2016-01-17 (×5): qty 1

## 2016-01-17 MED ORDER — BUDESONIDE 0.25 MG/2ML IN SUSP
0.2500 mg | Freq: Two times a day (BID) | RESPIRATORY_TRACT | Status: DC
  Administered 2016-01-17 – 2016-01-19 (×3): 0.25 mg via RESPIRATORY_TRACT
  Filled 2016-01-17 (×5): qty 2

## 2016-01-17 MED ORDER — BUPIVACAINE-EPINEPHRINE (PF) 0.25% -1:200000 IJ SOLN
INTRAMUSCULAR | Status: AC
  Filled 2016-01-17: qty 30

## 2016-01-17 MED ORDER — PROPOFOL 10 MG/ML IV BOLUS
INTRAVENOUS | Status: DC | PRN
  Administered 2016-01-17: 120 mg via INTRAVENOUS

## 2016-01-17 MED ORDER — SUGAMMADEX SODIUM 200 MG/2ML IV SOLN
INTRAVENOUS | Status: DC | PRN
  Administered 2016-01-17: 200 mg via INTRAVENOUS

## 2016-01-17 MED ORDER — BISACODYL 10 MG RE SUPP
10.0000 mg | Freq: Every day | RECTAL | Status: DC | PRN
  Administered 2016-01-19: 10 mg via RECTAL
  Filled 2016-01-17: qty 1

## 2016-01-17 MED ORDER — ACETAMINOPHEN 500 MG PO TABS
1000.0000 mg | ORAL_TABLET | Freq: Four times a day (QID) | ORAL | Status: AC
  Administered 2016-01-17 (×2): 1000 mg via ORAL
  Filled 2016-01-17 (×4): qty 2

## 2016-01-17 MED ORDER — CLINDAMYCIN PHOSPHATE 900 MG/50ML IV SOLN
900.0000 mg | Freq: Three times a day (TID) | INTRAVENOUS | Status: AC
  Administered 2016-01-17 – 2016-01-18 (×3): 900 mg via INTRAVENOUS
  Filled 2016-01-17 (×3): qty 50

## 2016-01-17 MED ORDER — MORPHINE SULFATE (PF) 2 MG/ML IV SOLN: 0.5000 mg | INTRAVENOUS | Status: DC | PRN

## 2016-01-17 MED ORDER — MAGNESIUM HYDROXIDE 400 MG/5ML PO SUSP
30.0000 mL | Freq: Every day | ORAL | Status: DC | PRN
  Administered 2016-01-18: 30 mL via ORAL
  Filled 2016-01-17: qty 30

## 2016-01-17 MED ORDER — BUPIVACAINE-EPINEPHRINE (PF) 0.5% -1:200000 IJ SOLN
INTRAMUSCULAR | Status: AC
  Filled 2016-01-17: qty 30

## 2016-01-17 MED ORDER — ENOXAPARIN SODIUM 30 MG/0.3ML ~~LOC~~ SOLN
30.0000 mg | Freq: Two times a day (BID) | SUBCUTANEOUS | Status: DC
  Administered 2016-01-18 – 2016-01-19 (×3): 30 mg via SUBCUTANEOUS
  Filled 2016-01-17 (×3): qty 0.3

## 2016-01-17 MED ORDER — NEOMYCIN-POLYMYXIN B GU 40-200000 IR SOLN
Status: AC
  Filled 2016-01-17: qty 2

## 2016-01-17 MED ORDER — WARFARIN - PHARMACIST DOSING INPATIENT: Freq: Every day | Status: DC

## 2016-01-17 MED ORDER — FLUTICASONE PROPIONATE 50 MCG/ACT NA SUSP
1.0000 | Freq: Every day | NASAL | Status: DC
  Administered 2016-01-18 – 2016-01-19 (×2): 1 via NASAL
  Filled 2016-01-17: qty 16

## 2016-01-17 MED ORDER — ONDANSETRON HCL 4 MG/2ML IJ SOLN: 4.0000 mg | Freq: Four times a day (QID) | INTRAMUSCULAR | Status: DC | PRN

## 2016-01-17 MED ORDER — WARFARIN - PHYSICIAN DOSING INPATIENT: Freq: Every day | Status: DC

## 2016-01-17 MED ORDER — LACTATED RINGERS IV SOLN
INTRAVENOUS | Status: DC | PRN
  Administered 2016-01-17: 08:00:00 via INTRAVENOUS

## 2016-01-17 MED ORDER — FENTANYL CITRATE (PF) 100 MCG/2ML IJ SOLN
INTRAMUSCULAR | Status: AC
Start: 2016-01-17 — End: 2016-01-17
  Filled 2016-01-17: qty 2

## 2016-01-17 MED ORDER — METOCLOPRAMIDE HCL 5 MG/ML IJ SOLN
5.0000 mg | Freq: Three times a day (TID) | INTRAMUSCULAR | Status: DC | PRN
Start: 2016-01-17 — End: 2016-01-19

## 2016-01-17 MED ORDER — PHENOL 1.4 % MT LIQD
1.0000 | OROMUCOSAL | Status: DC | PRN
  Filled 2016-01-17: qty 177

## 2016-01-17 MED ORDER — BUPIVACAINE-EPINEPHRINE 0.5% -1:200000 IJ SOLN
INTRAMUSCULAR | Status: DC | PRN
  Administered 2016-01-17: 30 mL

## 2016-01-17 MED ORDER — PHENYLEPHRINE HCL 10 MG/ML IJ SOLN
INTRAMUSCULAR | Status: DC | PRN
  Administered 2016-01-17 (×3): 200 ug via INTRAVENOUS

## 2016-01-17 MED ORDER — FENTANYL CITRATE (PF) 100 MCG/2ML IJ SOLN
25.0000 ug | INTRAMUSCULAR | Status: DC | PRN
  Administered 2016-01-17 (×2): 25 ug via INTRAVENOUS

## 2016-01-17 MED ORDER — CLINDAMYCIN PHOSPHATE 600 MG/50ML IV SOLN
INTRAVENOUS | Status: AC
  Filled 2016-01-17: qty 50

## 2016-01-17 MED ORDER — ONDANSETRON HCL 4 MG/2ML IJ SOLN
INTRAMUSCULAR | Status: DC | PRN
  Administered 2016-01-17: 4 mg via INTRAVENOUS

## 2016-01-17 MED ORDER — ONDANSETRON HCL 4 MG/2ML IJ SOLN: 4.0000 mg | Freq: Once | INTRAMUSCULAR | Status: DC | PRN

## 2016-01-17 MED ORDER — FLEET ENEMA 7-19 GM/118ML RE ENEM: 1.0000 | ENEMA | Freq: Once | RECTAL | Status: DC | PRN

## 2016-01-17 MED ORDER — FUROSEMIDE 20 MG PO TABS
20.0000 mg | ORAL_TABLET | Freq: Every day | ORAL | Status: DC
Start: 2016-01-18 — End: 2016-01-19
  Administered 2016-01-18 – 2016-01-19 (×2): 20 mg via ORAL
  Filled 2016-01-17 (×2): qty 1

## 2016-01-17 MED ORDER — SODIUM CHLORIDE 0.45 % IV SOLN
INTRAVENOUS | Status: DC
  Administered 2016-01-17 – 2016-01-18 (×2): via INTRAVENOUS

## 2016-01-17 MED ORDER — WARFARIN SODIUM 5 MG PO TABS: 5.0000 mg | ORAL_TABLET | Freq: Every day | ORAL | Status: DC

## 2016-01-17 MED ORDER — FERROUS SULFATE 325 (65 FE) MG PO TABS
325.0000 mg | ORAL_TABLET | Freq: Every day | ORAL | Status: DC
  Administered 2016-01-18 – 2016-01-19 (×2): 325 mg via ORAL
  Filled 2016-01-17 (×2): qty 1

## 2016-01-17 MED ORDER — ALUM & MAG HYDROXIDE-SIMETH 200-200-20 MG/5ML PO SUSP: 30.0000 mL | ORAL | Status: DC | PRN

## 2016-01-17 SURGICAL SUPPLY — 36 items
100mm helical blade IMPLANT
BIT DRILL SPINE 4.0MMX260 (BIT) ×1
BIT DRILL SPINE 4.0X260 (BIT) ×2 IMPLANT
BLADE HELICAL TI 11.0X100 (BLADE) ×3 IMPLANT
BNDG COHESIVE 4X5 TAN STRL (GAUZE/BANDAGES/DRESSINGS) ×3 IMPLANT
CANISTER SUCT 1200ML W/VALVE (MISCELLANEOUS) ×3 IMPLANT
CHLORAPREP W/TINT 26ML (MISCELLANEOUS) ×6 IMPLANT
DRAPE C-ARMOR (DRAPES) IMPLANT
DRAPE INCISE 23X17 IOBAN STRL (DRAPES) ×2
DRAPE INCISE IOBAN 23X17 STRL (DRAPES) ×1 IMPLANT
DRSG AQUACEL AG ADV 3.5X10 (GAUZE/BANDAGES/DRESSINGS) IMPLANT
DRSG AQUACEL AG ADV 3.5X14 (GAUZE/BANDAGES/DRESSINGS) ×3 IMPLANT
ELECT REM PT RETURN 9FT ADLT (ELECTROSURGICAL) ×3
ELECTRODE REM PT RTRN 9FT ADLT (ELECTROSURGICAL) ×1 IMPLANT
GAUZE PETRO XEROFOAM 1X8 (MISCELLANEOUS) IMPLANT
GAUZE SPONGE 4X4 12PLY STRL (GAUZE/BANDAGES/DRESSINGS) ×3 IMPLANT
GLOVE INDICATOR 8.0 STRL GRN (GLOVE) ×9 IMPLANT
GLOVE SURG ORTHO 8.5 STRL (GLOVE) ×3 IMPLANT
GOWN STRL REUS W/ TWL LRG LVL3 (GOWN DISPOSABLE) ×2 IMPLANT
GOWN STRL REUS W/TWL LRG LVL3 (GOWN DISPOSABLE) ×4
GUIDEWIRE 3.2X400 (WIRE) ×3 IMPLANT
KIT RM TURNOVER STRD PROC AR (KITS) ×3 IMPLANT
MAT BLUE FLOOR 46X72 FLO (MISCELLANEOUS) ×3 IMPLANT
NAIL TROCH FX 11/130D 170-S (Nail) ×3 IMPLANT
NEEDLE SPNL 18GX3.5 QUINCKE PK (NEEDLE) ×3 IMPLANT
NS IRRIG 500ML POUR BTL (IV SOLUTION) ×3 IMPLANT
PACK HIP COMPR (MISCELLANEOUS) ×3 IMPLANT
REAMER ROD DEEP FLUTE 2.5X950 (INSTRUMENTS) ×3 IMPLANT
SCREW LOCKING IM 5.0MX42M (Screw) ×3 IMPLANT
STAPLER SKIN PROX 35W (STAPLE) ×3 IMPLANT
SUCTION FRAZIER HANDLE 10FR (MISCELLANEOUS) ×2
SUCTION TUBE FRAZIER 10FR DISP (MISCELLANEOUS) ×1 IMPLANT
SUT VIC AB 0 CT1 36 (SUTURE) ×3 IMPLANT
SUT VIC AB 2-0 CT1 27 (SUTURE) ×2
SUT VIC AB 2-0 CT1 TAPERPNT 27 (SUTURE) ×1 IMPLANT
SYR 30ML LL (SYRINGE) ×3 IMPLANT

## 2016-01-17 NOTE — Care Management Important Message (Signed)
Important Message  Patient Details  Name: Jeremy Gordon MRN: [id number] Date of Birth: 07-25-1940   Medicare Important Message Given:  Yes    Shakeela Rabadan A, RN 01/17/2016, 3:46 PM

## 2016-01-17 NOTE — Op Note (Signed)
DATE OF SURGERY:  01/17/2016  TIME: 9:33 AM  PATIENT NAME:  Jeremy Gordon  AGE: 75 y.o.  PRE-OPERATIVE DIAGNOSIS:  right hip fracture  POST-OPERATIVE DIAGNOSIS:  SAME  PROCEDURE:  INTRAMEDULLARY (IM) NAIL FEMORAL  SURGEON:  Draken Farrior E  EBL:  123XX123 cc  COMPLICATIONS:  none  OPERATIVE IMPLANTS: Synthes trochanteric femoral nail  130*/72mm  with interlocking helical blade  123XX123 mm and distal locking screw  42 mm.  PREOPERATIVE INDICATIONS:  Jeremy Gordon is a 75 y.o. year old who fell and suffered a hip fracture. He was brought into the ER and then admitted and optimized and then elected for surgical intervention.    The risks benefits and alternatives were discussed with the patient including but not limited to the risks of nonoperative treatment, versus surgical intervention including infection, bleeding, nerve injury, malunion, nonunion, hardware prominence, hardware failure, need for hardware removal, blood clots, cardiopulmonary complications, morbidity, mortality, among others, and they were willing to proceed.    OPERATIVE PROCEDURE:  The patient was brought to the operating room and placed in the supine position.  General endotracheal anesthesia was administered, with a foley. He was placed on the fracture table.  Closed reduction was performed under C-arm guidance. The length of the femur was also measured using fluoroscopy. Time out was then performed after sterile prep and drape. He received preoperative antibiotics.  Incision was made proximal to the greater trochanter. A guidewire was placed in the appropriate position. Confirmation was made on AP and lateral views. The above-named nail was opened. I opened the proximal femur with a reamer. I then placed the nail by hand easily down. I did not need to ream the femur.  Once the nail was completely seated, I placed a guidepin into the femoral head into the center center position through a second incision.  I measured the  length, and then reamed the lateral cortex and up into the head. I then placed the helical blade. Slight compression was applied. Anatomic fixation achieved. Bone quality was mediocre.  I then secured the proximal interlock.  The distal locking screw was then placed and after confirming the position of the fracture fragments and hardware I then removed the instruments, and took final C-arm pictures AP and lateral the entire length of the leg. Anatomic reconstruction was achieved, and the wounds were irrigated copiously and closed with Vicryl  followed by staples and Aquacel for the skin. Sponge and needle count were correct.   The patient was awakened and returned to PACU in stable and satisfactory condition. There no complications and the patient tolerated the procedure well.  He will be partial weightbearing as tolerated, and will be on Lovenox  For DVT prophylaxis.     Park Breed, M.D.

## 2016-01-17 NOTE — H&P (Signed)
.  hrm

## 2016-01-17 NOTE — Transfer of Care (Addendum)
Immediate Anesthesia Transfer of Care Note  Patient: Jeremy Gordon  Procedure(s) Performed: Procedure(s): INTRAMEDULLARY (IM) NAIL FEMORAL (Right)  Patient Location: PACU  Anesthesia Type:General  Level of Consciousness: alert  and oriented  Airway & Oxygen Therapy: Patient Spontanous Breathing and Patient connected to nasal cannula oxygen  Post-op Assessment: Report given to RN and Post -op Vital signs reviewed and stable  Post vital signs: Reviewed and stable  Last Vitals:  Vitals:   01/17/16 0013 01/17/16 0946  BP: 130/76 107/62  Pulse: 75 76  Resp: 20 (!) 24  Temp: 36.4 C 36.2 C    Last Pain:  Vitals:   01/17/16 0731  TempSrc:   PainSc: 3          Complications: No apparent anesthesia complications

## 2016-01-17 NOTE — Progress Notes (Signed)
SUBJECTIVE: Patient had surgery of hip without any problem no chest pain or shortness of breath   Vitals:   01/16/16 1449 01/16/16 1928 01/17/16 0013 01/17/16 0946  BP: 110/64 131/72 130/76 107/62  Pulse: 88 70 75 76  Resp:  20 20 (!) 24  Temp: 98 F (36.7 C) 97.8 F (36.6 C) 97.5 F (36.4 C) 97.2 F (36.2 C)  TempSrc: Oral Oral Oral   SpO2: 96% 97% 95% 96%  Weight:      Height:        Intake/Output Summary (Last 24 hours) at 01/17/16 1053 Last data filed at 01/17/16 1028  Gross per 24 hour  Intake          1987.25 ml  Output             1525 ml  Net           462.25 ml    LABS: Basic Metabolic Panel:  Recent Labs  01/15/16 1333 01/16/16 0355  NA 139 138  K 3.5 4.0  CL 105 106  CO2 28 28  GLUCOSE 104* 122*  BUN 31* 27*  CREATININE 1.10 0.94  CALCIUM 8.9 8.6*   Liver Function Tests:  Recent Labs  01/15/16 1333  AST 21  ALT 15*  ALKPHOS 59  BILITOT 1.0  PROT 5.7*  ALBUMIN 3.4*   No results for input(s): LIPASE, AMYLASE in the last 72 hours. CBC:  Recent Labs  01/15/16 1333 01/16/16 0355  WBC 14.7* 9.4  NEUTROABS 12.4*  --   HGB 13.1 13.2  HCT 38.8* 39.0*  MCV 90.2 90.1  PLT 194 156   Cardiac Enzymes: No results for input(s): CKTOTAL, CKMB, CKMBINDEX, TROPONINI in the last 72 hours. BNP: Invalid input(s): POCBNP D-Dimer: No results for input(s): DDIMER in the last 72 hours. Hemoglobin A1C: No results for input(s): HGBA1C in the last 72 hours. Fasting Lipid Panel: No results for input(s): CHOL, HDL, LDLCALC, TRIG, CHOLHDL, LDLDIRECT in the last 72 hours. Thyroid Function Tests: No results for input(s): TSH, T4TOTAL, T3FREE, THYROIDAB in the last 72 hours.  Invalid input(s): FREET3 Anemia Panel: No results for input(s): VITAMINB12, FOLATE, FERRITIN, TIBC, IRON, RETICCTPCT in the last 72 hours.   PHYSICAL EXAM General: Well developed, well nourished, in no acute distress HEENT:  Normocephalic and atramatic Neck:  No JVD.  Lungs:  Clear bilaterally to auscultation and percussion. Heart: HRRR . Normal S1 and S2 without gallops or murmurs.  Abdomen: Bowel sounds are positive, abdomen soft and non-tender  Msk:  Back normal, normal gait. Normal strength and tone for age. Extremities: No clubbing, cyanosis or edema.   Neuro: Alert and oriented X 3. Psych:  Good affect, responds appropriately  TELEMETRY:Gordon. fib with controlled rate  ASSESSMENT AND PLAN: Atrial fibrillation which is chronic with moderate to severe LV dysfunction with left ventricular ejection fraction 35% and four-chamber dilatation. Patient clinically doing very well postoperatively will continue to monitor the patient.  Active Problems:   Closed right hip fracture (HCC)    Jeremy Laming A, MD, [facility] 01/17/2016 10:53 AM

## 2016-01-17 NOTE — Progress Notes (Signed)
ANTICOAGULATION CONSULT NOTE - Initial Consult  Pharmacy Consult for warfarin dosing Indication: atrial fibrillation   Assessment: Pharmacy consulted to dose warfarin for 75 yo male s/p hip fracture repair. Patient received two doses of Vitamin K prior to surgery. Patient takes warfarin 8mg  daily for Afib and goal INR 2-3. Patient currently ordered enoxaparin 30mg  SQ daily to start on 10/2.    Plan:  Warfarin 10mg  PO x 1 followed by home regimen of warfarin 8mg  daily. Will obtain INR with am labs and adjust as warranted.   Will adjust enoxaparin 30mg  SQ BID after am labs on 10/2.    No Known Allergies  Patient Measurements: Height: 5\' 7"  (170.2 cm) Weight: 239 lb (108.4 kg) IBW/kg (Calculated) : 66.1  Vital Signs: Temp: 98 F (36.7 C) (10/01 1437) Temp Source: Oral (10/01 1437) BP: 105/53 (10/01 1437) Pulse Rate: 89 (10/01 1437)  Labs:  Recent Labs  01/15/16 1333 01/16/16 0355 01/16/16 1947 01/17/16 0438  HGB 13.1 13.2  --  13.0  HCT 38.8* 39.0*  --  39.4*  PLT 194 156  --  139*  APTT 46*  --   --   --   LABPROT 24.1* 24.5* 19.8* 17.4*  INR 2.12 2.17 1.66 1.41  CREATININE 1.10 0.94  --  0.82    Estimated Creatinine Clearance: 88.6 mL/min (by C-G formula based on SCr of 0.82 mg/dL).   Medical History: Past Medical History:  Diagnosis Date  . Arthritis   . Atrial fibrillation ([place])   . COPD (chronic obstructive pulmonary disease) ([place])   . Dysrhythmia   . Esophageal carcinoma ([place])    a. Chemo: FOLFIRI  . Essential hypertension   . History of stress test    a. 06/2003 MV: EF 56%, no ischemia/infarct.  . Persistent atrial fibrillation ([place])    a. 07/2013 Echo: EF 60-65%, mod dil LA, mild TR;  CHA2DS2VASc = 3-->chronic coumadin.  . Sleep apnea     Pharmacy will continue to monitor and adjust per consult.   Simpson,Michael L 01/17/2016,3:27 PM

## 2016-01-17 NOTE — Progress Notes (Signed)
Patient ID: Jeremy Gordon, male   DOB: November 06, 1940, 75 y.o.   MRN: [id number]  [employer] PROGRESS NOTE  JOHNDANIEL MAJKOWSKI [id number] DOB: [birth year] DOA: 01/15/2016 PCP: [name], MD  HPI/Subjective: Patient seen at 12 noon. Patient at that time was breathing fine and offered no complaints of shortness of breath or chest discomfort. He actually didn't have any pain in his right hip at that point.  Objective: Vitals:   01/17/16 1251 01/17/16 1333  BP: (!) 103/57 127/77  Pulse: 76 90  Resp: 14 14  Temp: 98.2 F (36.8 C) 98.3 F (36.8 C)    Filed Weights   01/15/16 1125  Weight: 108.4 kg (239 lb)    ROS: Review of Systems  Constitutional: Negative for chills and fever.  Eyes: Negative for blurred vision.  Respiratory: Negative for cough and shortness of breath.   Cardiovascular: Negative for chest pain.  Gastrointestinal: Negative for abdominal pain, constipation, diarrhea, nausea and vomiting.  Genitourinary: Negative for dysuria.  Musculoskeletal: Negative for joint pain.  Neurological: Negative for dizziness and headaches.   Exam: Physical Exam  Constitutional: He is oriented to person, place, and time.  HENT:  Nose: No mucosal edema.  Mouth/Throat: No oropharyngeal exudate or posterior oropharyngeal edema.  Eyes: Conjunctivae, EOM and lids are normal. Pupils are equal, round, and reactive to light.  Neck: No JVD present. Carotid bruit is not present. No edema present. No thyroid mass and no thyromegaly present.  Cardiovascular: S1 normal and S2 normal.  An irregularly irregular rhythm present. Exam reveals no gallop.   No murmur heard. Pulses:      Dorsalis pedis pulses are 2+ on the right side, and 2+ on the left side.  Respiratory: No respiratory distress. He has no wheezes. He has no rhonchi. He has no rales.  GI: Soft. Bowel sounds are normal. There is no tenderness.  Musculoskeletal:       Right ankle: He exhibits swelling.       Left ankle: He  exhibits swelling.  Lymphadenopathy:    He has no cervical adenopathy.  Neurological: He is alert and oriented to person, place, and time. No cranial nerve deficit.  Skin: Skin is warm. No rash noted. Nails show no clubbing.  Psychiatric: He has a normal mood and affect.      Data Reviewed: Basic Metabolic Panel:  Recent Labs Lab 01/15/16 1333 01/16/16 0355 01/17/16 0438  NA 139 138  --   K 3.5 4.0  --   CL 105 106  --   CO2 28 28  --   GLUCOSE 104* 122*  --   BUN 31* 27*  --   CREATININE 1.10 0.94 0.82  CALCIUM 8.9 8.6*  --    Liver Function Tests:  Recent Labs Lab 01/15/16 1333  AST 21  ALT 15*  ALKPHOS 59  BILITOT 1.0  PROT 5.7*  ALBUMIN 3.4*   CBC:  Recent Labs Lab 01/15/16 1333 01/16/16 0355 01/17/16 0438  WBC 14.7* 9.4 8.2  NEUTROABS 12.4*  --   --   HGB 13.1 13.2 13.0  HCT 38.8* 39.0* 39.4*  MCV 90.2 90.1 91.4  PLT 194 156 139*     Recent Results (from the past 240 hour(s))  Surgical pcr screen     Status: None   Collection Time: 01/15/16  4:11 PM  Result Value Ref Range Status   MRSA, PCR NEGATIVE NEGATIVE Final   Staphylococcus aureus NEGATIVE NEGATIVE Final    Comment:  The Xpert SA Assay (FDA approved for NASAL specimens in patients over 36 years of age), is one component of a comprehensive surveillance program.  Test performance has been validated by [facility] for patients greater than or equal to 57 year old. It is not intended to diagnose infection nor to guide or monitor treatment.   Urine culture     Status: None   Collection Time: 01/15/16 10:14 PM  Result Value Ref Range Status   Specimen Description URINE, CLEAN CATCH  Final   Special Requests NONE  Final   Culture NO GROWTH Performed at [facility]   Final   Report Status 01/17/2016 FINAL  Final     Studies: Ct Hip Right Wo Contrast  Result Date: 01/15/2016 CLINICAL DATA:  Status post fall.  Right hip pain. EXAM: CT OF THE RIGHT HIP WITHOUT  CONTRAST TECHNIQUE: Multidetector CT imaging of the right hip was performed according to the standard protocol. Multiplanar CT image reconstructions were also generated. COMPARISON:  None. FINDINGS: Bones/Joint/Cartilage Severe osteopenia. Impacted nondisplaced right subcapital fracture. Additionally, acute intertrochanteric fracture with mild displacement. Mild osteoarthritis of the right hip. Mild osteoarthritis of the right sacroiliac joint. No aggressive lytic or sclerotic osseous lesion. Ligaments Suboptimally assessed by CT. Muscles and Tendons Muscles are normal.  No intramuscular hematoma or fluid collection. Soft tissues No fluid collection or hematoma. Peripheral vascular atherosclerotic disease. IMPRESSION: 1. Acute, impacted nondisplaced right subcapital fracture. Acute intertrochanteric fracture with mild displacement. Electronically Signed   By: Kathreen Devoid   On: 01/15/2016 15:42   Dg Hip Operative Unilat W Or W/o Pelvis Right  Result Date: 01/17/2016 CLINICAL DATA:  ORIF of right hip fracture. EXAM: OPERATIVE RIGHT HIP (WITH PELVIS IF PERFORMED) 3 VIEWS TECHNIQUE: Fluoroscopic spot image(s) were submitted for interpretation post-operatively. COMPARISON:  Right hip CT - 01/15/2016 ; right hip radiographs - 01/14/2026 FINDINGS: Three spot intraoperative fluoroscopic images of the right hip are provided for review Images demonstrate the sequela of intra medullary rod fixation of the proximal femur. The intra medullary rod is transfixed with a single cancellous screw. Post dynamic screw fixation of the right femoral neck. There is minimal residual displacement of the right greater trochanter though overall alignment appears improved. There is subcutaneous emphysema about the operative site. No radiopaque foreign body. IMPRESSION: Post ORIF of the right femur and femoral neck without evidence of complication. Electronically Signed   By: Sandi Mariscal M.D.   On: 01/17/2016 09:32    Scheduled Meds: .  acetaminophen  1,000 mg Oral Q6H  . budesonide  0.25 mg Nebulization BID  .  ceFAZolin (ANCEF) IV  2 g Intravenous Q6H  . clindamycin (CLEOCIN) IV  900 mg Intravenous Q8H  . enalapril  2.5 mg Oral Daily  . [START ON 01/18/2016] enoxaparin (LOVENOX) injection  30 mg Subcutaneous Q24H  . fentaNYL      . [START ON 01/18/2016] ferrous sulfate  325 mg Oral Q breakfast  . fluticasone  1 spray Each Nare Daily  . [START ON 01/18/2016] furosemide  20 mg Oral Daily  . senna  1 tablet Oral BID  . warfarin  5 mg Oral q1800  . Warfarin - Physician Dosing Inpatient   Does not apply q1800   Continuous Infusions: . sodium chloride 75 mL/hr at 01/17/16 1102    Assessment/Plan:  1. Atrial fibrillation. Continue digoxin and diltiazem and metoprolol for heart rate control. Pharmacy to restart Coumadin. 2. Right hip fracture status post repair today. Likely out to  rehabilitation on Wednesday if INR therapeutic. Pain control. 3. Leukocytosis reactive. Improved after IV fluids. 4. COPD respiratory status stable. 5. History of esophageal cancer. Follow-up with Dr. Grayland Ormond as outpatient 6. BPH on finasteride and Flomax 7. GERD on Carafate and Protonix  Code Status:     Code Status Orders        Start     Ordered   01/15/16 1537  Full code  Continuous     01/15/16 1536    Code Status History    Date Active Date Inactive Code Status Order ID Comments User Context   01/15/2016  3:36 PM 01/15/2016 10:25 PM Full Code [id number]  Demetrios Loll, MD Inpatient   01/26/2015  6:21 PM 02/02/2015  6:18 PM Full Code [id number]  Vaughan Basta, MD ED     Disposition Plan: Third day postoperatively likely out to rehabilitation. Patient wants to go home though.  Consultants:  Orthopedic surgery  Time spent: 26 minutes  [place], [place]

## 2016-01-17 NOTE — Anesthesia Preprocedure Evaluation (Signed)
Anesthesia Evaluation  Patient identified by MRN, date of birth, ID band Patient awake    Reviewed: Allergy & Precautions, H&P , NPO status , Patient's Chart, lab work & pertinent test results, reviewed documented beta blocker date and time   History of Anesthesia Complications Negative for: history of anesthetic complications  Airway Mallampati: II   Neck ROM: full    Dental  (+) Poor Dentition, Edentulous Upper, Edentulous Lower   Pulmonary neg pulmonary ROS, shortness of breath, with exertion and Long-Term Oxygen Therapy, sleep apnea and Continuous Positive Airway Pressure Ventilation , pneumonia, resolved, COPD, Current Smoker,    Pulmonary exam normal        Cardiovascular hypertension, negative cardio ROS Normal cardiovascular exam+ dysrhythmias Atrial Fibrillation  Rate:Normal     Neuro/Psych  Neuromuscular disease negative neurological ROS  negative psych ROS   GI/Hepatic negative GI ROS, Neg liver ROS, GERD  Medicated,  Endo/Other  negative endocrine ROS  Renal/GU negative Renal ROS  negative genitourinary   Musculoskeletal   Abdominal   Peds  Hematology negative hematology ROS (+)   Anesthesia Other Findings Past Medical History:   Sleep apnea                                                  Persistent atrial fibrillation ([place])                           Comment:a. 07/2013 Echo: EF 60-65%, mod dil LA, mild TR;              CHA2DS2VASc = 3-->chronic coumadin.   Essential hypertension                                       History of stress test                                         Comment:a. 06/2003 MV: EF 56%, no ischemia/infarct.   COPD (chronic obstructive pulmonary disease) (*              Esophageal carcinoma (HCC)                                     Comment:a. Chemo: FOLFIRI   Dysrhythmia                                                  Atrial fibrillation [facility])                                   Arthritis                                                  Past Surgical  History:   PERIPHERAL VASCULAR CATHETERIZATION             N/A 11/20/2014       Comment:Procedure: Porta Cath Insertion;  Surgeon:               Algernon Huxley, MD;  Location: [facility]               LAB;  Service: Cardiovascular;  Laterality:               N/A;   HERNIA REPAIR                                               BMI    Body Mass Index   36.49 kg/m 2     Reproductive/Obstetrics negative OB ROS                             Anesthesia Physical  Anesthesia Plan  ASA: IV  Anesthesia Plan: General   Post-op Pain Management:    Induction:   Airway Management Planned:   Additional Equipment:   Intra-op Plan:   Post-operative Plan:   Informed Consent: I have reviewed the patients History and Physical, chart, labs and discussed the procedure including the risks, benefits and alternatives for the proposed anesthesia with the patient or authorized representative who has indicated his/her understanding and acceptance.   Dental Advisory Given  Plan Discussed with: CRNA  Anesthesia Plan Comments:         Anesthesia Quick Evaluation

## 2016-01-17 NOTE — H&P (Signed)
THE PATIENT WAS SEEN PRIOR TO SURGERY TODAY.  HISTORY, ALLERGIES, HOME MEDICATIONS AND OPERATIVE PROCEDURE WERE REVIEWED. RISKS AND BENEFITS OF SURGERY DISCUSSED WITH PATIENT AGAIN.  NO CHANGES FROM INITIAL HISTORY AND PHYSICAL NOTED.    

## 2016-01-17 NOTE — Anesthesia Postprocedure Evaluation (Signed)
Anesthesia Post Note  Patient: Jeremy Gordon  Procedure(s) Performed: Procedure(s) (LRB): INTRAMEDULLARY (IM) NAIL FEMORAL (Right)  Patient location during evaluation: PACU Anesthesia Type: General Level of consciousness: awake and alert Pain management: pain level controlled Vital Signs Assessment: post-procedure vital signs reviewed and stable Respiratory status: spontaneous breathing, nonlabored ventilation, respiratory function stable and patient connected to nasal cannula oxygen Cardiovascular status: blood pressure returned to baseline and stable Postop Assessment: no signs of nausea or vomiting Anesthetic complications: no    Last Vitals:  Vitals:   01/17/16 1102 01/17/16 1132  BP: 106/64 107/77  Pulse: 80 74  Resp: 16 14  Temp: 36.7 C 36.4 C    Last Pain:  Vitals:   01/17/16 1132  TempSrc: Oral  PainSc:                  Martha Clan

## 2016-01-17 NOTE — Anesthesia Procedure Notes (Signed)
Procedure Name: Intubation Date/Time: 01/17/2016 8:16 AM Performed by: Darlyne Russian Pre-anesthesia Checklist: Patient identified, Emergency Drugs available, Suction available and Patient being monitored Patient Re-evaluated:Patient Re-evaluated prior to inductionOxygen Delivery Method: Circle system utilized Preoxygenation: Pre-oxygenation with 100% oxygen Intubation Type: IV induction Ventilation: Mask ventilation without difficulty Laryngoscope Size: Mac and 4 Grade View: Grade II Tube size: 7.5 mm Number of attempts: 1 Airway Equipment and Method: Stylet Placement Confirmation: ETT inserted through vocal cords under direct vision,  positive ETCO2 and breath sounds checked- equal and bilateral Secured at: 22 cm Tube secured with: Tape Dental Injury: Teeth and Oropharynx as per pre-operative assessment

## 2016-01-17 NOTE — Clinical Social Work Note (Signed)
CSW visited patient at bedside to discuss possible dc to SNF for STR. The patient was awake, alert, and oriented x4. The patient's sister, Inez Catalina, was at bedside, and the patient gave verbal permission for her to remain in the room. The client declined dc to SNF, citing that he would prefer [facility] if necessary. CSW signing off.  Santiago Bumpers, MSW, LCSW-A [phone]

## 2016-01-18 ENCOUNTER — Ambulatory Visit
Admission: RE | Admit: 2016-01-18 | Discharge: 2016-01-18 | Disposition: A | Discharge status: Home / Self Care | Payer: Medicare Other | Source: Ambulatory Visit | Attending: Oncology | Admitting: Oncology

## 2016-01-18 ENCOUNTER — Encounter: Payer: Self-pay | Admitting: Specialist

## 2016-01-18 ENCOUNTER — Telehealth: Payer: Self-pay | Admitting: *Deleted

## 2016-01-18 LAB — CBC
HCT: 37.5 % — ABNORMAL LOW (ref 40.0–52.0)
Hemoglobin: 12.5 g/dL — ABNORMAL LOW (ref 13.0–18.0)
MCH: 30.3 pg (ref 26.0–34.0)
MCHC: 33.3 g/dL (ref 32.0–36.0)
MCV: 91.2 fL (ref 80.0–100.0)
PLATELETS: 144 10*3/uL — AB (ref 150–440)
RBC: 4.11 MIL/uL — ABNORMAL LOW (ref 4.40–5.90)
RDW: 18.5 % — AB (ref 11.5–14.5)
WBC: 13.4 10*3/uL — AB (ref 3.8–10.6)

## 2016-01-18 LAB — BASIC METABOLIC PANEL
ANION GAP: 6 (ref 5–15)
BUN: 18 mg/dL (ref 6–20)
CALCIUM: 8.4 mg/dL — AB (ref 8.9–10.3)
CO2: 28 mmol/L (ref 22–32)
Chloride: 104 mmol/L (ref 101–111)
Creatinine, Ser: 0.81 mg/dL (ref 0.61–1.24)
GFR calc Af Amer: >60 mL/min (ref >60)
GLUCOSE: 146 mg/dL — AB (ref 65–99)
Potassium: 3.7 mmol/L (ref 3.5–5.1)
SODIUM: 138 mmol/L (ref 135–145)

## 2016-01-18 LAB — PROTIME-INR
INR: 1.27
PROTHROMBIN TIME: 16 s — AB (ref 11.4–15.2)

## 2016-01-18 MED ORDER — DOCUSATE SODIUM 100 MG PO CAPS
100.0000 mg | ORAL_CAPSULE | Freq: Two times a day (BID) | ORAL | Status: DC
  Administered 2016-01-18 – 2016-01-19 (×2): 100 mg via ORAL
  Filled 2016-01-18 (×2): qty 1

## 2016-01-18 MED ORDER — PANTOPRAZOLE SODIUM 40 MG PO TBEC
40.0000 mg | DELAYED_RELEASE_TABLET | Freq: Every day | ORAL | Status: DC
  Administered 2016-01-18 – 2016-01-19 (×2): 40 mg via ORAL
  Filled 2016-01-18 (×2): qty 1

## 2016-01-18 MED ORDER — SUCRALFATE 1 GM/10ML PO SUSP
1.0000 g | Freq: Three times a day (TID) | ORAL | Status: DC
  Administered 2016-01-18 – 2016-01-19 (×3): 1 g via ORAL
  Filled 2016-01-18 (×3): qty 10

## 2016-01-18 MED ORDER — DIGOXIN 125 MCG PO TABS
0.1250 mg | ORAL_TABLET | Freq: Every day | ORAL | Status: DC
  Administered 2016-01-18 – 2016-01-19 (×2): 0.125 mg via ORAL
  Filled 2016-01-18 (×2): qty 1

## 2016-01-18 MED ORDER — DILTIAZEM HCL ER COATED BEADS 120 MG PO CP24
120.0000 mg | ORAL_CAPSULE | Freq: Every day | ORAL | Status: DC
  Administered 2016-01-18 – 2016-01-19 (×2): 120 mg via ORAL
  Filled 2016-01-18 (×2): qty 1

## 2016-01-18 MED ORDER — GABAPENTIN 300 MG PO CAPS
300.0000 mg | ORAL_CAPSULE | Freq: Two times a day (BID) | ORAL | Status: DC
  Administered 2016-01-18 – 2016-01-19 (×2): 300 mg via ORAL
  Filled 2016-01-18 (×2): qty 1

## 2016-01-18 MED ORDER — WARFARIN SODIUM 5 MG PO TABS
10.0000 mg | ORAL_TABLET | Freq: Once | ORAL | Status: AC
  Administered 2016-01-18: 10 mg via ORAL
  Filled 2016-01-18: qty 2

## 2016-01-18 MED ORDER — METOPROLOL TARTRATE 50 MG PO TABS
50.0000 mg | ORAL_TABLET | Freq: Two times a day (BID) | ORAL | Status: DC
  Administered 2016-01-18 – 2016-01-19 (×2): 50 mg via ORAL
  Filled 2016-01-18 (×2): qty 1

## 2016-01-18 NOTE — Progress Notes (Signed)
SUBJECTIVE: Patient is feeling much better after having surgery of the hip.   Vitals:   01/17/16 1616 01/17/16 1958 01/18/16 0543 01/18/16 0752  BP: 115/66 118/66 (!) 144/89 (!) 144/93  Pulse: 81 77 (!) 45 (!) 104  Resp: 14 18 17 20   Temp: 98.2 F (36.8 C) 97.6 F (36.4 C) 98 F (36.7 C) 98 F (36.7 C)  TempSrc: Oral Oral Oral Oral  SpO2: 93% 96% 90% 95%  Weight:      Height:        Intake/Output Summary (Last 24 hours) at 01/18/16 0832 Last data filed at 01/18/16 0543  Gross per 24 hour  Intake           3467.5 ml  Output              825 ml  Net           2642.5 ml    LABS: Basic Metabolic Panel:  Recent Labs  01/16/16 0355 01/17/16 0438 01/18/16 0343  NA 138  --  138  K 4.0  --  3.7  CL 106  --  104  CO2 28  --  28  GLUCOSE 122*  --  146*  BUN 27*  --  18  CREATININE 0.94 0.82 0.81  CALCIUM 8.6*  --  8.4*   Liver Function Tests:  Recent Labs  01/15/16 1333  AST 21  ALT 15*  ALKPHOS 59  BILITOT 1.0  PROT 5.7*  ALBUMIN 3.4*   No results for input(s): LIPASE, AMYLASE in the last 72 hours. CBC:  Recent Labs  01/15/16 1333  01/17/16 0438 01/18/16 0343  WBC 14.7*  < > 8.2 13.4*  NEUTROABS 12.4*  --   --   --   HGB 13.1  < > 13.0 12.5*  HCT 38.8*  < > 39.4* 37.5*  MCV 90.2  < > 91.4 91.2  PLT 194  < > 139* 144*  < > = values in this interval not displayed. Cardiac Enzymes: No results for input(s): CKTOTAL, CKMB, CKMBINDEX, TROPONINI in the last 72 hours. BNP: Invalid input(s): POCBNP D-Dimer: No results for input(s): DDIMER in the last 72 hours. Hemoglobin A1C: No results for input(s): HGBA1C in the last 72 hours. Fasting Lipid Panel: No results for input(s): CHOL, HDL, LDLCALC, TRIG, CHOLHDL, LDLDIRECT in the last 72 hours. Thyroid Function Tests: No results for input(s): TSH, T4TOTAL, T3FREE, THYROIDAB in the last 72 hours.  Invalid input(s): FREET3 Anemia Panel: No results for input(s): VITAMINB12, FOLATE, FERRITIN, TIBC, IRON,  RETICCTPCT in the last 72 hours.   PHYSICAL EXAM General: Well developed, well nourished, in no acute distress HEENT:  Normocephalic and atramatic Neck:  No JVD.  Lungs: Clear bilaterally to auscultation and percussion. Heart: HRRR . Normal S1 and S2 without gallops or murmurs.  Abdomen: Bowel sounds are positive, abdomen soft and non-tender  Msk:  Back normal, normal gait. Normal strength and tone for age. Extremities: No clubbing, cyanosis or edema.   Neuro: Alert and oriented X 3. Psych:  Good affect, responds appropriately  TELEMETRY:A. fib with controlled rate  ASSESSMENT AND PLAN: Atrial fibrillation with controlled ventricular rate and cardiomyopathy with left ventricle ejection fraction 35% and four-chamber dilatation. Patient had hip surgery yesterday and doing very well postoperatively.  Active Problems:   Closed right hip fracture (HCC)    Neoma Laming A, MD, [facility] 01/18/2016 8:32 AM

## 2016-01-18 NOTE — Progress Notes (Addendum)
Central tele called to notify pts nonsustained HR 142, MD Chen notified.

## 2016-01-18 NOTE — Progress Notes (Signed)
Chaplain was asked by a nurse to visit patient. Patient told chaplain he loves riding tractors better than riding cars, Pt also talked about his family, and then later his brother arrived and joined the conversation which went on for a while. Patient was overjoyed and asked for prayers for healing for his body and chaplain provided prayers and presence.

## 2016-01-18 NOTE — Telephone Encounter (Signed)
Called to report that he fell and fractured his hip, He is scheduled for "light to go down his esophagus" asking if that still needs to be done. He is in room 151. Please advise

## 2016-01-18 NOTE — Progress Notes (Signed)
Physical Therapy Treatment Patient Details Name: Jeremy Gordon MRN: [id number] DOB: [birth year] Today's Date: 01/18/2016    History of Present Illness Pt. is a 75 y.o. male who was admitted for ORIF repair of a Right Hip Fracture.    PT Comments    Pt up in chair; reports 3/10 pain in R hip/thigh and more so in right knee. Pt performs long sit and seated exercises with fluctuating heart rate between 104 and 118 beats per minute. Increased rest between activity in attempt to manage heart rate. One stand performed with 10 right straight leg raises; heart rate increases to 136. Pt returned to sit and further stand/ambulation efforts deferred at this time. Pt very talkative and admittedly gets worked up easily with discussion. Encouraged to focus on breathing with exercises and rest versus communicating. Pt prefers to remain up in chair at this time. Continue PT to progress strength, endurance and all functional mobility. Recommendation continues to be skilled nursing facility post acute care stay. Pt brings up subject during session and notes he will not go to rehab due to past poor experience.   Follow Up Recommendations  SNF (pt states he refuses to go)     Equipment Recommendations   (has several rw/cruthces at home; prefers crutches)    Recommendations for Other Services       Precautions / Restrictions Precautions Precautions: Fall Restrictions Weight Bearing Restrictions: Yes RLE Weight Bearing: Partial weight bearing    Mobility  Bed Mobility               General bed mobility comments: Not tested; up in chair and wishes to stay in chair  Transfers Overall transfer level: Needs assistance Equipment used: Rolling walker (2 wheeled) Transfers: Sit to/from Stand Sit to Stand: Min assist         General transfer comment: Slow to rise; cues for improved uprisht posture  Ambulation/Gait             General Gait Details: Deferred due to fluctuating HR between  116 and 136 with stand   Stairs            Wheelchair Mobility    Modified Rankin (Stroke Patients Only)       Balance   Sitting-balance support: Feet supported;Bilateral upper extremity supported Sitting balance-Leahy Scale: Good     Standing balance support: Bilateral upper extremity supported Standing balance-Leahy Scale: Fair                      Cognition Arousal/Alertness: Awake/alert Behavior During Therapy: WFL for tasks assessed/performed Overall Cognitive Status: Within Functional Limits for tasks assessed                      Exercises General Exercises - Lower Extremity Ankle Circles/Pumps: AROM;Both;20 reps (long sit) Quad Sets: Strengthening;Both;10 reps (long sit) Gluteal Sets: Strengthening;Both;10 reps (long sit) Long Arc Quad: AAROM;Right;10 reps;Seated (2 sets) Hip ABduction/ADduction: AROM;Both;10 reps;Seated (2 sets) Straight Leg Raises: Strengthening;Right;10 reps;Standing Hip Flexion/Marching: AAROM;Right;10 reps;Seated (2 sets, knees flexed) Toe Raises: AROM;Both;20 reps;Seated Heel Raises: AROM;Both;20 reps;Seated    General Comments        Pertinent Vitals/Pain Pain Assessment: 0-10 Pain Score: 3  Pain Location: R hip/thigh Pain Descriptors / Indicators: Operative site guarding Pain Intervention(s): Monitored during session;Limited activity within patient's tolerance    Home Living                      Prior  Function            PT Goals (current goals can now be found in the care plan section) Acute Rehab PT Goals Patient Stated Goal: To return home Progress towards PT goals: Progressing toward goals    Frequency    BID      PT Plan Current plan remains appropriate    Co-evaluation             End of Session Equipment Utilized During Treatment: Gait belt Activity Tolerance: Patient tolerated treatment well;Patient limited by pain (increased HR (up to 136)) Patient left: in  chair;with call bell/phone within reach;with chair alarm set;Other (comment) (CP applied)     Time: [id number] PT Time Calculation (min) (ACUTE ONLY): 31 min  Charges:  $Therapeutic Exercise: 8-22 mins $Therapeutic Activity: 8-22 mins                    G Codes:      Larae Grooms, PTA 01/18/2016, 2:25 PM

## 2016-01-18 NOTE — Evaluation (Signed)
Occupational Therapy Evaluation Patient Details Name: Jeremy Gordon MRN: [id number] DOB: Feb 20, 1941 Today's Date: 01/18/2016    History of Present Illness Pt. is a 75 y.o. male who was admitted for ORIF repair of a Right Hip Fracture.   Clinical Impression   Pt. Is a 75 y.o. Male who was admitted for ORIF repair of a right hip fracture. Pt. Presents with weakness, pain, decreased activity tolerance, and decreased functional mobility. Pt. Could benefit from skilled OT services for ADL training, A/E training, UE there. Ex., and pt. education about energy conservation/work simplification techniques for ADLs, and IADLs. Pt. Plans to return hoem upon discharge with family assistance.    Follow Up Recommendations  Home health OT    Equipment Recommendations       Recommendations for Other Services       Precautions / Restrictions Precautions Precautions: Fall Restrictions Weight Bearing Restrictions: Yes RLE Weight Bearing: Partial weight bearing              ADL Overall ADL's : Needs assistance/impaired Eating/Feeding: Set up   Grooming: Set up               Lower Body Dressing: Moderate assistance                 General ADL Comments: Pt. education was provided about A/E use for LE ADLs.     Vision     Perception     Praxis      Pertinent Vitals/Pain Pain Assessment: 0-10 Pain Score: 3  Pain Location: Right Hip Fracture Pain Intervention(s): Monitored during session     Hand Dominance Right   Extremity/Trunk Assessment Upper Extremity Assessment Upper Extremity Assessment: RUE deficits/detail RUE Deficits / Details: Right shoulder limitations.     Communication Communication Communication: No difficulties   Cognition Arousal/Alertness: Awake/alert Behavior During Therapy: WFL for tasks assessed/performed Overall Cognitive Status: Within Functional Limits for tasks assessed                     General Comments        Exercises   Other Exercises Other Exercises: LE exercises for AROM and strengthening 1x10reps; ankle pumps, heel slides, SAQ, quad sets, SLR (AAROM), hip abd/add.   Shoulder Instructions      Home Living Family/patient expects to be discharged to:: Private residence Living Arrangements: Spouse/significant other Available Help at Discharge: Family Type of Home: House Home Access: Ramped entrance     [place]: Two level Alternate Level Stairs-Number of Steps: full flight  Alternate Level Stairs-Rails: Right Bathroom Shower/Tub: Curtain;Tub/shower unit Shower/tub characteristics: Curtain Biochemist, clinical: Standard     Home Equipment: Environmental consultant - 2 wheels;Walker - 4 wheels;Crutches;Cane - single point;Wheelchair - manual   Additional Comments: pt. reports he owns multiple AD's but does not use a specific one regularly.       Prior Functioning/Environment Level of Independence: Independent                 OT Problem List: Decreased strength;Pain;Impaired UE functional use;Decreased knowledge of use of DME or AE   OT Treatment/Interventions: Self-care/ADL training;Therapeutic exercise;Therapeutic activities;Patient/family education    OT Goals(Current goals can be found in the care plan section) Acute Rehab OT Goals Patient Stated Goal: To return home OT Goal Formulation: With patient Potential to Achieve Goals: Good  OT Frequency: Min 1X/week   Barriers to D/C:            Co-evaluation  End of Session Equipment Utilized During Treatment: Gait belt  Activity Tolerance: Patient tolerated treatment well Patient left: in bed   Time: [id number] OT Time Calculation (min): 28 min Charges:  OT Evaluation $OT Eval Moderate Complexity: 1 Procedure G-Codes:    Harrel Carina, MS, OTR/L 01/18/2016, 10:36 AM

## 2016-01-18 NOTE — Progress Notes (Signed)
Subjective: 1 Day Post-Op Procedure(s) (LRB): INTRAMEDULLARY (IM) NAIL FEMORAL (Right)    Patient reports pain as mild. [place] chair.  Alert. Comfortable  hgb stable.   Objective:   VITALS:   Vitals:   01/18/16 0752 01/18/16 1350  BP: (!) 144/93   Pulse: (!) 104 (!) 103  Resp: 20   Temp: 98 F (36.7 C)     Neurologically intact ABD soft Neurovascular intact Sensation intact distally Intact pulses distally Dorsiflexion/Plantar flexion intact Incision: no drainage  LABS  Recent Labs  01/16/16 0355 01/17/16 0438 01/18/16 0343  HGB 13.2 13.0 12.5*  HCT 39.0* 39.4* 37.5*  WBC 9.4 8.2 13.4*  PLT 156 139* 144*     Recent Labs  01/16/16 0355 01/17/16 0438 01/18/16 0343  NA 138  --  138  K 4.0  --  3.7  BUN 27*  --  18  CREATININE 0.94 0.82 0.81  GLUCOSE 122*  --  146*     Recent Labs  01/17/16 0438 01/18/16 0343  INR 1.41 1.27     Assessment/Plan: 1 Day Post-Op Procedure(s) (LRB): INTRAMEDULLARY (IM) NAIL FEMORAL (Right)   Advance diet Up with therapy D/C IV fluids Plan for discharge tomorrow Discharge home with home health

## 2016-01-18 NOTE — Evaluation (Signed)
Physical Therapy Evaluation Patient Details Name: Jeremy Gordon MRN: [id number] DOB: 1940-05-02 Today's Date: 01/18/2016   History of Present Illness  pt. 75y.o. male who reports to ED following a fall and pain in R hip/knee, imaging positive for hip fx. negative knee findings, ORIF R hip performed 01/17/16., pt. currently PWB.   Clinical Impression  Pt. Supine in bed upon arrival. Pt. Demonstrates grossly WFL strength with exception of RLE grossly 3+/5. Pt. Able to transfer from supine to EOB with min A. Also demonstrates ability to perform sit<>stand from EOB and recliner chair min A with verbal cues for safe technique and use of RW Pt. Able to perform approx. 32ft. Of gait with min A +2 for safety and use of RW demonstrating step to pattern leading with RLE very small step length/height, heavy reliance on RW for B UE support especially with R stance. Pt. Verbalized understanding of PWB status and demonstrated adherence to throughout transfers/gait. Pt's HR fluctuated throughout session >100bpm, increasing to 120-130's with activity peak HR 147, nursing informed. Would benefit from skilled PT to address above deficits and promote optimal return to PLOF Recommend SNF placement upon d/c to follow up with further skilled PT needs. Will continue to progress mobility and update recommendations as appropriate.     Follow Up Recommendations SNF    Equipment Recommendations       Recommendations for Other Services       Precautions / Restrictions Precautions Precautions: Fall Restrictions Weight Bearing Restrictions: Yes RLE Weight Bearing: Partial weight bearing      Mobility  Bed Mobility Overal bed mobility: Needs Assistance;+ 2 for safety/equipment Bed Mobility: Supine to Sit     Supine to sit: Min assist     General bed mobility comments: Pt. assisted transfer with B LE intiation and B UE support from bedrails, required min A for trunk positioning and verbal cues for optimal  positioning   Transfers Overall transfer level: Needs assistance Equipment used: Rolling walker (2 wheeled) Transfers: Sit to/from Stand Sit to Stand: Min assist;+2 safety/equipment         General transfer comment: Pt. able to transfer from EOB to standing and from standing to recliner chair with min A, able to demonstrate safe technique with verbal cues and maintain PWB throughout transfer, slow transition time and L lateral lean throughout movements  Ambulation/Gait Ambulation/Gait assistance: Min assist;+2 safety/equipment Ambulation Distance (Feet): 3 Feet Assistive device: Rolling walker (2 wheeled)       General Gait Details: Pt. demonstrates ability to ambulate approx. 3 feet from EOB to chair min A with use of RW for B UE support, demonstrates step to pattern leading with RLE, very small step length/height, heavy reliance on RW but able to maintain PWB throughout, verbal cues for step placement and RW negotiation. no buckling or LOB  Stairs            Wheelchair Mobility    Modified Rankin (Stroke Patients Only)       Balance Overall balance assessment: Needs assistance Sitting-balance support: Bilateral upper extremity supported;Feet supported Sitting balance-Leahy Scale: Good     Standing balance support: Bilateral upper extremity supported Standing balance-Leahy Scale: Fair Standing balance comment: Pt. favors LLE, requires B UE support to maintain balance                             Pertinent Vitals/Pain Pain Assessment: 0-10 Pain Score: 4  Pain Location: R hip Pain  Intervention(s): Monitored during session    Home Living Family/patient expects to be discharged to:: Private residence Living Arrangements: Spouse/significant other Available Help at Discharge: Family (pt. lives with wife, states his son and daughter in law would be available to help as well or he could stay with them) Type of Home: House Home Access: Ramped entrance      Home Layout: Two level;Able to live on main level with bedroom/bathroom Home Equipment: Gilford Rile - 2 wheels;Walker - 4 wheels;Crutches;Cane - single point;Wheelchair - manual Additional Comments: pt. reports he owns multiple AD's but does not use a specific one regularly.     Prior Function Level of Independence: Independent               Hand Dominance        Extremity/Trunk Assessment   Upper Extremity Assessment: Overall WFL for tasks assessed           Lower Extremity Assessment: Overall WFL for tasks assessed (R LE decreased strength grossly 3+/5, sensation intact)         Communication   Communication: No difficulties  Cognition Arousal/Alertness: Awake/alert Behavior During Therapy: WFL for tasks assessed/performed Overall Cognitive Status: Within Functional Limits for tasks assessed                      General Comments      Exercises Other Exercises Other Exercises: LE exercises for AROM and strengthening 1x10reps; ankle pumps, heel slides, SAQ, quad sets, SLR (AAROM), hip abd/add.   Assessment/Plan    PT Assessment Patient needs continued PT services  PT Problem List Decreased strength;Decreased balance;Decreased mobility;Decreased range of motion;Decreased activity tolerance;Decreased knowledge of use of DME;Decreased knowledge of precautions          PT Treatment Interventions DME instruction;Gait training;Functional mobility training;Therapeutic activities;Stair training;Therapeutic exercise;Balance training;Patient/family education    PT Goals (Current goals can be found in the Care Plan section)  Acute Rehab PT Goals Patient Stated Goal: Pt. would like to return to his home  PT Goal Formulation: With patient Time For Goal Achievement: 02/01/16 Potential to Achieve Goals: Fair    Frequency BID   Barriers to discharge        Co-evaluation               End of Session Equipment Utilized During Treatment: Gait  belt Activity Tolerance: Patient tolerated treatment well Patient left: in chair;with call bell/phone within reach;with chair alarm set;with SCD's reapplied (heel elevated)           Time: [id number] PT Time Calculation (min) (ACUTE ONLY): 34 min   Charges:         PT G Codes:         Marlisha Vanwyk, SPT  01/18/16,11:51 AM

## 2016-01-18 NOTE — Care Management (Signed)
RNCM consult received and will continue to follow. PT evaluation pending.

## 2016-01-18 NOTE — Progress Notes (Signed)
ANTICOAGULATION CONSULT NOTE - Follow Up Consult  Pharmacy Consult for Warfarin Indication: atrial fibrillation  No Known Allergies  Patient Measurements: Height: 5\' 7"  (170.2 cm) Weight: 239 lb (108.4 kg) IBW/kg (Calculated) : 66.1  Vital Signs: Temp: 98 F (36.7 C) (10/02 0752) Temp Source: Oral (10/02 0752) BP: 144/93 (10/02 0752) Pulse Rate: 104 (10/02 0752)  Labs:  Recent Labs  01/15/16 1333 01/16/16 0355 01/16/16 1947 01/17/16 0438 01/18/16 0343  HGB 13.1 13.2  --  13.0 12.5*  HCT 38.8* 39.0*  --  39.4* 37.5*  PLT 194 156  --  139* 144*  APTT 46*  --   --   --   --   LABPROT 24.1* 24.5* 19.8* 17.4* 16.0*  INR 2.12 2.17 1.66 1.41 1.27  CREATININE 1.10 0.94  --  0.82 0.81    Estimated Creatinine Clearance: 89.7 mL/min (by C-G formula based on SCr of 0.81 mg/dL).    Assessment: Pharmacy consulted to dose warfarin for 75 yo male s/p hip fracture repair on 10/1. Last dose of warfarin per PTA med list was 9/28. Patient received two doses of Vitamin K on 9/29 and 9/30 prior to surgery. Patient takes warfarin 8mg  daily for Afib per PTA med list.   Patient currently ordered enoxaparin 30mg  SQ q12h to start on 10/2.    10/1  INR 1.41 - warfarin 10 mg 10/2  INR 1.27  Goal of Therapy:  INR 2-3 Monitor platelets by anticoagulation protocol: Yes   Plan:  INR subtherapeutic due to holding warfarin for two days and two doses of vitamin K for surgery.  Will repeat higher warfarin dose of 10 mg for tonight. Hgb relatively stable.  Follow up INR in AM  Pharmacy will continue to follow.   Rocky Morel 01/18/2016,12:06 PM

## 2016-01-18 NOTE — Care Management Note (Signed)
Case Management Note  Patient Details  Name: Jeremy Gordon MRN: [id number] Date of Birth: 1940/10/03  Subjective/Objective:                  Met with patient, his brother, and two nieces to discuss discharge planning. Patient refused SNF but agrees with home health PT and [facility]. He states he has a rolling walker available to use at home. He lives with his wife and states he has supportive children that can drive him to appointments. He said he'd "be better off at home".   Action/Plan:   [facility]erral [facility] sent. RNCM will continue to follow.   Expected Discharge Date:                  Expected Discharge Plan:     In-House Referral:     Discharge planning Services  CM Consult  Post Acute Care Choice:  Home Health Choice offered to:  Patient  DME Arranged:    DME Agency:     HH Arranged:  PT [place]:  [place]  Status of Service:  In process, will continue to follow  If discussed at Long Length of Stay Meetings, dates discussed:    Additional Comments:  Marshell Garfinkel, RN 01/18/2016, 11:58 AM

## 2016-01-18 NOTE — Clinical Social Work Note (Signed)
Clinical Social Work Assessment  Patient Details  Name: Jeremy Gordon MRN: [id number] Date of Birth: 12-Jul-1940  Date of referral:  01/18/16               Reason for consult:  Facility Placement                Permission sought to share information with:  Chartered certified accountant granted to share information::  No  Name::        Agency::     Relationship::     Contact Information:     Housing/Transportation Living arrangements for the past 2 months:  Single Family Home Source of Information:  Patient, Other (Comment Required) (Nieces ) Patient Interpreter Needed:  None Criminal Activity/Legal Involvement Pertinent to Current Situation/Hospitalization:  No - Comment as needed Significant Relationships:  Adult Children, Other Family Members, Spouse Lives with:  Spouse Do you feel safe going back to the place where you live?  Yes Need for family participation in patient care:  Yes (Comment)  Care giving concerns:  Patient lives in [place] with his wife Jeremy Gordon.    Social Worker assessment / plan:  Holiday representative (CSW) received verbal consult from PT that recommendation is SNF. CSW met with patient and his 2 nieces were at bedside. CSW introduced self and explained role of CSW department. Patient was alert and oriented and was sitting up in the chair. Patient reported that he lives with his wife in [place] and has a big support system. Per patient his sister and brother in law live next door and also provide support. Patient reported that he will likely start chemo again at [facility]. CSW made patient aware that PT is recommending SNF. Patient adamantly refused SNF and reported that he is going home with home health. RN case manager aware of above. CSW will continue to follow and assist as needed.   Employment status:  Disabled (Comment on whether or not currently receiving Disability), Retired Nurse, adult PT  Recommendations:  [place] / Referral to community resources:  Other (Comment Required) (Patient refused SNF and is agreeable to home health. )  Patient/Family's Response to care:  Patient refused SNF.   Patient/Family's Understanding of and Emotional Response to Diagnosis, Current Treatment, and Prognosis:  Patient thanked CSW for visit and declined SNF.   Emotional Assessment Appearance:  Appears stated age Attitude/Demeanor/Rapport:    Affect (typically observed):  Pleasant Orientation:  Oriented to Self, Oriented to Place, Oriented to  Time, Oriented to Situation Alcohol / Substance use:  Not Applicable Psych involvement (Current and /or in the community):  No (Comment)  Discharge Needs  Concerns to be addressed:  Discharge Planning Concerns Readmission within the last 30 days:  No Current discharge risk:  Dependent with Mobility Barriers to Discharge:  Continued Medical Work up   [employer], Veronia Beets, LCSW 01/18/2016, 12:06 PM

## 2016-01-18 NOTE — Telephone Encounter (Signed)
Informed Jeremy Gordon that per Dr Grayland Ormond that he can postpone it, but he still needs to get it done. She is going to check with Dr Sabra Heck to see if he can ride in car to get procedure done and if not she will call Dr Vira Agar to reschedule it

## 2016-01-18 NOTE — Progress Notes (Signed)
Patient ID: Jeremy Gordon, male   DOB: Apr 11, 1941, 75 y.o.   MRN: [id number]  [employer] PROGRESS NOTE  Jeremy Gordon [id number] DOB: [birth year] DOA: 01/15/2016 PCP: Jeremy Ohms, MD  HPI/Subjective: No complaint.  Objective: Vitals:   01/18/16 1350 01/18/16 1544  BP:  138/67  Pulse: (!) 103 94  Resp:  18  Temp:  97.6 F (36.4 C)    Filed Weights   01/15/16 1125  Weight: 239 lb (108.4 kg)    ROS: Review of Systems  Constitutional: Negative for chills and fever.  Eyes: Negative for blurred vision.  Respiratory: Negative for cough and shortness of breath.   Cardiovascular: Negative for chest pain.  Gastrointestinal: Negative for abdominal pain, constipation, diarrhea, nausea and vomiting.  Genitourinary: Negative for dysuria.  Musculoskeletal: Negative for joint pain.  Neurological: Negative for dizziness and headaches.   Exam: Physical Exam  Constitutional: He is oriented to person, place, and time.  HENT:  Nose: No mucosal edema.  Mouth/Throat: No oropharyngeal exudate or posterior oropharyngeal edema.  Eyes: Conjunctivae, EOM and lids are normal. Pupils are equal, round, and reactive to light.  Neck: No JVD present. Carotid bruit is not present. No edema present. No thyroid mass and no thyromegaly present.  Cardiovascular: S1 normal and S2 normal.  An irregularly irregular rhythm present. Exam reveals no gallop.   No murmur heard. Pulses:      Dorsalis pedis pulses are 2+ on the right side, and 2+ on the left side.  Respiratory: No respiratory distress. He has no wheezes. He has no rhonchi. He has no rales.  GI: Soft. Bowel sounds are normal. There is no tenderness.  Musculoskeletal:       Right ankle: He exhibits swelling.       Left ankle: He exhibits swelling.  Lymphadenopathy:    He has no cervical adenopathy.  Neurological: He is alert and oriented to person, place, and time. No cranial nerve deficit.  Skin: Skin is warm. No rash noted. Nails  show no clubbing.  Psychiatric: He has a normal mood and affect.      Data Reviewed: Basic Metabolic Panel:  Recent Labs Lab 01/15/16 1333 01/16/16 0355 01/17/16 0438 01/18/16 0343  NA 139 138  --  138  K 3.5 4.0  --  3.7  CL 105 106  --  104  CO2 28 28  --  28  GLUCOSE 104* 122*  --  146*  BUN 31* 27*  --  18  CREATININE 1.10 0.94 0.82 0.81  CALCIUM 8.9 8.6*  --  8.4*   Liver Function Tests:  Recent Labs Lab 01/15/16 1333  AST 21  ALT 15*  ALKPHOS 59  BILITOT 1.0  PROT 5.7*  ALBUMIN 3.4*   CBC:  Recent Labs Lab 01/15/16 1333 01/16/16 0355 01/17/16 0438 01/18/16 0343  WBC 14.7* 9.4 8.2 13.4*  NEUTROABS 12.4*  --   --   --   HGB 13.1 13.2 13.0 12.5*  HCT 38.8* 39.0* 39.4* 37.5*  MCV 90.2 90.1 91.4 91.2  PLT 194 156 139* 144*     Recent Results (from the past 240 hour(s))  Surgical pcr screen     Status: None   Collection Time: 01/15/16  4:11 PM  Result Value Ref Range Status   MRSA, PCR NEGATIVE NEGATIVE Final   Staphylococcus aureus NEGATIVE NEGATIVE Final    Comment:        The Xpert SA Assay (FDA approved for NASAL specimens in patients over 21  years of age), is one component of a comprehensive surveillance program.  Test performance has been validated by [facility] for patients greater than or equal to 75 year old. It is not intended to diagnose infection nor to guide or monitor treatment.   Urine culture     Status: None   Collection Time: 01/15/16 10:14 PM  Result Value Ref Range Status   Specimen Description URINE, CLEAN CATCH  Final   Special Requests NONE  Final   Culture NO GROWTH Performed at [facility]   Final   Report Status 01/17/2016 FINAL  Final     Studies: Dg Hip Operative Unilat W Or W/o Pelvis Right  Result Date: 01/17/2016 CLINICAL DATA:  ORIF of right hip fracture. EXAM: OPERATIVE RIGHT HIP (WITH PELVIS IF PERFORMED) 3 VIEWS TECHNIQUE: Fluoroscopic spot image(s) were submitted for interpretation  post-operatively. COMPARISON:  Right hip CT - 01/15/2016 ; right hip radiographs - 01/14/2026 FINDINGS: Three spot intraoperative fluoroscopic images of the right hip are provided for review Images demonstrate the sequela of intra medullary rod fixation of the proximal femur. The intra medullary rod is transfixed with a single cancellous screw. Post dynamic screw fixation of the right femoral neck. There is minimal residual displacement of the right greater trochanter though overall alignment appears improved. There is subcutaneous emphysema about the operative site. No radiopaque foreign body. IMPRESSION: Post ORIF of the right femur and femoral neck without evidence of complication. Electronically Signed   By: Jeremy Gordon M.D.   On: 01/17/2016 09:32    Scheduled Meds: . budesonide  0.25 mg Nebulization BID  . enalapril  2.5 mg Oral Daily  . enoxaparin (LOVENOX) injection  30 mg Subcutaneous Q12H  . ferrous sulfate  325 mg Oral Q breakfast  . fluticasone  1 spray Each Nare Daily  . furosemide  20 mg Oral Daily  . senna  1 tablet Oral BID  . warfarin  10 mg Oral ONCE-1800  . Warfarin - Pharmacist Dosing Inpatient   Does not apply q1800   Continuous Infusions: . sodium chloride Stopped (01/18/16 1230)    Assessment/Plan:  1. Atrial fibrillation. Continue digoxin and diltiazem and metoprolol for heart rate control. continue Coumadin PTD, f/u INR. 2. Right hip fracture, POD1. Likely out to rehabilitation on Wednesday if INR therapeutic. Pain control. PT. 3. Leukocytosis reactive to surgery. F/u CBC. 4. COPD respiratory status stable. 5. History of esophageal cancer. Follow-up with Jeremy Gordon as outpatient 6. BPH on finasteride and Flomax 7. GERD on Carafate and Protonix  Code Status:     Code Status Orders        Start     Ordered   01/15/16 1537  Full code  Continuous     01/15/16 1536    Code Status History    Date Active Date Inactive Code Status Order ID Comments User  Context   01/15/2016  3:36 PM 01/15/2016 10:25 PM Full Code [id number]  Jeremy Loll, MD Inpatient   01/26/2015  6:21 PM 02/02/2015  6:18 PM Full Code [id number]  Jeremy Basta, MD ED     Disposition Plan: Third day postoperatively likely out to rehabilitation. Patient wants to go home though. Discussed with pt and his brother.  Consultants:  Orthopedic surgery  Time spent: 32 minutes  [place], [place]

## 2016-01-19 LAB — CBC
HEMATOCRIT: 38.8 % — AB (ref 40.0–52.0)
HEMOGLOBIN: 13 g/dL (ref 13.0–18.0)
MCH: 30.4 pg (ref 26.0–34.0)
MCHC: 33.5 g/dL (ref 32.0–36.0)
MCV: 90.8 fL (ref 80.0–100.0)
Platelets: 145 10*3/uL — ABNORMAL LOW (ref 150–440)
RBC: 4.28 MIL/uL — ABNORMAL LOW (ref 4.40–5.90)
RDW: 18.2 % — AB (ref 11.5–14.5)
WBC: 9.9 10*3/uL (ref 3.8–10.6)

## 2016-01-19 LAB — BASIC METABOLIC PANEL
Anion gap: 6 (ref 5–15)
BUN: 22 mg/dL — AB (ref 6–20)
CHLORIDE: 104 mmol/L (ref 101–111)
CO2: 29 mmol/L (ref 22–32)
CREATININE: 0.76 mg/dL (ref 0.61–1.24)
Calcium: 8.5 mg/dL — ABNORMAL LOW (ref 8.9–10.3)
GFR calc Af Amer: >60 mL/min (ref >60)
GFR calc non Af Amer: >60 mL/min (ref >60)
GLUCOSE: 102 mg/dL — AB (ref 65–99)
Potassium: 3.8 mmol/L (ref 3.5–5.1)
Sodium: 139 mmol/L (ref 135–145)

## 2016-01-19 LAB — PROTIME-INR
INR: 1.2
Prothrombin Time: 15.3 seconds — ABNORMAL HIGH (ref 11.4–15.2)

## 2016-01-19 MED ORDER — HYDROCODONE-ACETAMINOPHEN 5-325 MG PO TABS: 1.0000 | ORAL_TABLET | Freq: Four times a day (QID) | ORAL | 0 refills | Status: DC | PRN

## 2016-01-19 MED ORDER — SENNA 8.6 MG PO TABS: 1.0000 | ORAL_TABLET | Freq: Two times a day (BID) | ORAL | 0 refills | Status: AC

## 2016-01-19 NOTE — Progress Notes (Signed)
SUBJECTIVE: Patient is feeling much better   Vitals:   01/18/16 1544 01/18/16 1955 01/19/16 0433 01/19/16 0755  BP: 138/67 117/67 (!) 144/95 (!) 151/99  Pulse: 94 84 89 (!) 51  Resp: 18 19 19 18   Temp: 97.6 F (36.4 C) 97.7 F (36.5 C) 97.5 F (36.4 C) 97.6 F (36.4 C)  TempSrc: Oral Oral Oral Oral  SpO2: 97% 95% 93% 94%  Weight:      Height:        Intake/Output Summary (Last 24 hours) at 01/19/16 0836 Last data filed at 01/19/16 0756  Gross per 24 hour  Intake                0 ml  Output             2080 ml  Net            -2080 ml    LABS: Basic Metabolic Panel:  Recent Labs  01/18/16 0343 01/19/16 0359  NA 138 139  K 3.7 3.8  CL 104 104  CO2 28 29  GLUCOSE 146* 102*  BUN 18 22*  CREATININE 0.81 0.76  CALCIUM 8.4* 8.5*   Liver Function Tests: No results for input(s): AST, ALT, ALKPHOS, BILITOT, PROT, ALBUMIN in the last 72 hours. No results for input(s): LIPASE, AMYLASE in the last 72 hours. CBC:  Recent Labs  01/18/16 0343 01/19/16 0359  WBC 13.4* 9.9  HGB 12.5* 13.0  HCT 37.5* 38.8*  MCV 91.2 90.8  PLT 144* 145*   Cardiac Enzymes: No results for input(s): CKTOTAL, CKMB, CKMBINDEX, TROPONINI in the last 72 hours. BNP: Invalid input(s): POCBNP D-Dimer: No results for input(s): DDIMER in the last 72 hours. Hemoglobin A1C: No results for input(s): HGBA1C in the last 72 hours. Fasting Lipid Panel: No results for input(s): CHOL, HDL, LDLCALC, TRIG, CHOLHDL, LDLDIRECT in the last 72 hours. Thyroid Function Tests: No results for input(s): TSH, T4TOTAL, T3FREE, THYROIDAB in the last 72 hours.  Invalid input(s): FREET3 Anemia Panel: No results for input(s): VITAMINB12, FOLATE, FERRITIN, TIBC, IRON, RETICCTPCT in the last 72 hours.   PHYSICAL EXAM General: Well developed, well nourished, in no acute distress HEENT:  Normocephalic and atramatic Neck:  No JVD.  Lungs: Clear bilaterally to auscultation and percussion. Heart: HRRR . Normal S1 and  S2 without gallops or murmurs.  Abdomen: Bowel sounds are positive, abdomen soft and non-tender  Msk:  Back normal, normal gait. Normal strength and tone for age. Extremities: No clubbing, cyanosis or edema.   Neuro: Alert and oriented X 3. Psych:  Good affect, responds appropriately  TELEMETRY:Sinus rhythm  ASSESSMENT AND PLAN: Status post right hip fracture and fixation with surgery doing very well. Patient has atrial fibrillation and ejection fraction 35% with four-chamber dilatation but is not short of breath and is not complaining of chest pain or palpitation. Advise continuing current care and physical therapy and will see the patient as an outpatient for his atrial fibrillation and LV dysfunction.  Active Problems:   Closed right hip fracture (HCC)    Neoma Laming A, MD, [facility] 01/19/2016 8:36 AM

## 2016-01-19 NOTE — Care Management (Signed)
Patient ready for discharge to home today with wife. I have notified Corene Cornea with [facility] of patient discharge. No further RNCM needs.

## 2016-01-19 NOTE — Progress Notes (Signed)
ANTICOAGULATION CONSULT NOTE - Follow Up Consult  Pharmacy Consult for Warfarin Indication: atrial fibrillation  No Known Allergies  Patient Measurements: Height: 5\' 7"  (170.2 cm) Weight: 239 lb (108.4 kg) IBW/kg (Calculated) : 66.1  Vital Signs: Temp: 97.6 F (36.4 C) (10/03 0755) Temp Source: Oral (10/03 0755) BP: 128/78 (10/03 0932) Pulse Rate: 79 (10/03 0932)  Labs:  Recent Labs  01/17/16 0438 01/18/16 0343 01/19/16 0359  HGB 13.0 12.5* 13.0  HCT 39.4* 37.5* 38.8*  PLT 139* 144* 145*  LABPROT 17.4* 16.0* 15.3*  INR 1.41 1.27 1.20  CREATININE 0.82 0.81 0.76    Estimated Creatinine Clearance: 90.8 mL/min (by C-G formula based on SCr of 0.76 mg/dL).    Assessment: Pharmacy consulted to dose warfarin for 75 yo male s/p hip fracture repair on 10/1. Last dose of warfarin per PTA med list was 9/28. Patient received two doses of Vitamin K on 9/29 and 9/30 prior to surgery. Patient takes warfarin 8mg  daily for Afib per PTA med list.   Patient currently ordered enoxaparin 30mg  SQ q12h to start on 10/2.    10/1  INR 1.41 - warfarin 10 mg 10/2  INR 1.27 - warfarin 10mg  10/3  INR 1.20  Goal of Therapy:  INR 2-3 Monitor platelets by anticoagulation protocol: Yes   Plan:  INR subtherapeutic due to holding warfarin for two days and two doses of vitamin K for surgery.  Will repeat higher warfarin dose of 10 mg for tonight. Hgb relatively stable.  Follow up INR in AM  Pharmacy will continue to follow.   Paulina Fusi, PharmD, BCPS 01/19/2016 1:00 PM

## 2016-01-19 NOTE — Progress Notes (Signed)
DISCHARGE NOTE:  Pt given discharge instructions and prescriptions. Pt verbalized understanding. Pt wheeled to car by staff.  

## 2016-01-19 NOTE — Discharge Summary (Signed)
[facility] [place] NAME: Jeremy Gordon    MR#:  [id number]  DATE OF BIRTH:  November 22, 1940  DATE OF ADMISSION:  01/15/2016   ADMITTING PHYSICIAN: Demetrios Loll, MD  DATE OF DISCHARGE: 01/19/2016 PRIMARY CARE PHYSICIAN: [name], MD   ADMISSION DIAGNOSIS:  Trauma [T14.90XA] Hip fracture, right, closed, initial encounter ([place]) [S72.001A] Fracture, hip, right, closed, initial encounter ([place]) [S72.001A] DISCHARGE DIAGNOSIS:  Active Problems:   Closed right hip fracture ([place])  SECONDARY DIAGNOSIS:   Past Medical History:  Diagnosis Date  . Arthritis   . Atrial fibrillation ([place])   . COPD (chronic obstructive pulmonary disease) ([place])   . Dysrhythmia   . Esophageal carcinoma ([place])    a. Chemo: FOLFIRI  . Essential hypertension   . History of stress test    a. 06/2003 MV: EF 56%, no ischemia/infarct.  . Persistent atrial fibrillation ([place])    a. 07/2013 Echo: EF 60-65%, mod dil LA, mild TR;  CHA2DS2VASc = 3-->chronic coumadin.  . Sleep apnea    HOSPITAL COURSE:  1. Atrial fibrillation. On digoxin and diltiazem and metoprolol for heart rate control. continue Coumadin PTD, f/u INR as outpatient. 2. Right hip fracture, POD2.  Per PT, need SNF, but pt refused, he wants to go home. 3. Leukocytosis reactive to surgery. Improved. 4. COPD respiratory status stable. 5. History of esophageal cancer. Follow-up with Dr. Grayland Ormond as outpatient 6. BPH on finasteride and Flomax 7. GERD on Carafate and Protonix  DISCHARGE CONDITIONS:  Stable, discharge to home with HHPT today. CONSULTS OBTAINED:  Treatment Team:  Earnestine Leys, MD Dionisio David, MD DRUG ALLERGIES:  No Known Allergies DISCHARGE MEDICATIONS:     Medication List    STOP taking these medications   levofloxacin 500 MG tablet Commonly known as:  LEVAQUIN     TAKE these medications   albuterol 108 (90 Base) MCG/ACT inhaler Commonly known as:  PROVENTIL HFA;VENTOLIN  HFA Inhale into the lungs.   chlorpheniramine-HYDROcodone 10-8 MG/5ML Suer Commonly known as:  TUSSIONEX TAKE 5ML BY MOUTH EVERY TWELVE HOURS AS NEEDED FOR COUGH   digoxin 0.125 MG tablet Commonly known as:  LANOXIN Take by mouth.   diltiazem 120 MG 24 hr capsule Commonly known as:  CARDIZEM CD   diphenoxylate-atropine 2.5-0.025 MG tablet Commonly known as:  LOMOTIL Take by mouth.   enalapril 20 MG tablet Commonly known as:  VASOTEC Take by mouth.   finasteride 5 MG tablet Commonly known as:  PROSCAR Take 5 mg by mouth daily.   fluticasone 220 MCG/ACT inhaler Commonly known as:  FLOVENT HFA Inhale into the lungs.   fluticasone 50 MCG/ACT nasal spray Commonly known as:  FLONASE ONE SPRAY IN EACH NOSTRIL AT NIGHT AS DIRECTED   furosemide 20 MG tablet Commonly known as:  LASIX Take 20 mg by mouth.   gabapentin 300 MG capsule Commonly known as:  NEURONTIN Take 300 mg by mouth 2 (two) times daily.   HYDROcodone-acetaminophen 5-325 MG tablet Commonly known as:  NORCO/VICODIN Take 1 tablet by mouth every 6 (six) hours as needed for moderate pain. What changed:  Another medication with the same name was removed. Continue taking this medication, and follow the directions you see here.   magic mouthwash Soln Take 5-10 mLs by mouth 4 (four) times daily.   metoprolol 50 MG tablet Commonly known as:  LOPRESSOR Take 50 mg by mouth.   nystatin 100000 UNIT/ML suspension Commonly known as:  MYCOSTATIN   omeprazole  20 MG capsule Commonly known as:  PRILOSEC Take 1 capsule (20 mg total) by mouth daily.   predniSONE 20 MG tablet Commonly known as:  DELTASONE Take 1 tablet (20 mg total) by mouth daily.   prochlorperazine 10 MG tablet Commonly known as:  COMPAZINE Take 1 tablet (10 mg total) by mouth every 6 (six) hours as needed for nausea or vomiting.   senna 8.6 MG Tabs tablet Commonly known as:  SENOKOT Take 1 tablet (8.6 mg total) by mouth 2 (two) times daily.    sucralfate 1 GM/10ML suspension Commonly known as:  CARAFATE Take 10 mLs (1 g total) by mouth 4 (four) times daily -  with meals and at bedtime.   tamsulosin 0.4 MG Caps capsule Commonly known as:  FLOMAX Take 0.4 mg by mouth daily.   warfarin 4 MG tablet Commonly known as:  COUMADIN Take 2 tablets by mouth with evening meal        DISCHARGE INSTRUCTIONS:   DIET:  Heart healthy diet. DISCHARGE CONDITION:  Stable. DISCHARGE LOCATION:    If you experience worsening of your admission symptoms, develop shortness of breath, life threatening emergency, suicidal or homicidal thoughts you must seek medical attention immediately by calling 911 or calling your MD immediately  if symptoms less severe.  You Must read complete instructions/literature along with all the possible adverse reactions/side effects for all the Medicines you take and that have been prescribed to you. Take any new Medicines after you have completely understood and accpet all the possible adverse reactions/side effects.   Please note  You were cared for by a hospitalist during your hospital stay. If you have any questions about your discharge medications or the care you received while you were in the hospital after you are discharged, you can call the unit and asked to speak with the hospitalist on call if the hospitalist that took care of you is not available. Once you are discharged, your primary care physician will handle any further medical issues. Please note that NO REFILLS for any discharge medications will be authorized once you are discharged, as it is imperative that you return to your primary care physician (or establish a relationship with a primary care physician if you do not have one) for your aftercare needs so that they can reassess your need for medications and monitor your lab values.    On the day of Discharge:  VITAL SIGNS:  Blood pressure 128/78, pulse 79, temperature 97.6 F (36.4 C),  temperature source Oral, resp. rate 18, height 5\' 7"  (1.702 m), weight 239 lb (108.4 kg), SpO2 94 %. PHYSICAL EXAMINATION:  GENERAL:  75 y.o.-year-old patient lying in the bed with no acute distress.  EYES: Pupils equal, round, reactive to light and accommodation. No scleral icterus. Extraocular muscles intact.  HEENT: Head atraumatic, normocephalic. Oropharynx and nasopharynx clear.  NECK:  Supple, no jugular venous distention. No thyroid enlargement, no tenderness.  LUNGS: Normal breath sounds bilaterally, no wheezing, rales,rhonchi or crepitation. No use of accessory muscles of respiration.  CARDIOVASCULAR: S1, S2 irregular. No murmurs, rubs, or gallops.  ABDOMEN: Soft, non-tender, non-distended. Bowel sounds present. No organomegaly or mass.  EXTREMITIES: No pedal edema, cyanosis, or clubbing.  NEUROLOGIC: Cranial nerves II through XII are intact. Muscle strength 5/5 in all extremities. Sensation intact. Gait not checked.  PSYCHIATRIC: The patient is alert and oriented x 3.  SKIN: No obvious rash, lesion, or ulcer.  DATA REVIEW:   CBC  Recent Labs Lab 01/19/16 0359  WBC 9.9  HGB 13.0  HCT 38.8*  PLT 145*    Chemistries   Recent Labs Lab 01/15/16 1333  01/19/16 0359  NA 139  < > 139  K 3.5  < > 3.8  CL 105  < > 104  CO2 28  < > 29  GLUCOSE 104*  < > 102*  BUN 31*  < > 22*  CREATININE 1.10  < > 0.76  CALCIUM 8.9  < > 8.5*  AST 21  --   --   ALT 15*  --   --   ALKPHOS 59  --   --   BILITOT 1.0  --   --   < > = values in this interval not displayed.   Microbiology Results  Results for orders placed or performed during the hospital encounter of 01/15/16  Surgical pcr screen     Status: None   Collection Time: 01/15/16  4:11 PM  Result Value Ref Range Status   MRSA, PCR NEGATIVE NEGATIVE Final   Staphylococcus aureus NEGATIVE NEGATIVE Final    Comment:        The Xpert SA Assay (FDA approved for NASAL specimens in patients over 51 years of age), is one  component of a comprehensive surveillance program.  Test performance has been validated by [facility] for patients greater than or equal to 60 year old. It is not intended to diagnose infection nor to guide or monitor treatment.   Urine culture     Status: None   Collection Time: 01/15/16 10:14 PM  Result Value Ref Range Status   Specimen Description URINE, CLEAN CATCH  Final   Special Requests NONE  Final   Culture NO GROWTH Performed at [facility]   Final   Report Status 01/17/2016 FINAL  Final    RADIOLOGY:  No results found.   Management plans discussed with the patient, family and they are in agreement.  CODE STATUS:     Code Status Orders        Start     Ordered   01/17/16 1055  Full code  Continuous     01/17/16 1054    Code Status History    Date Active Date Inactive Code Status Order ID Comments User Context   01/15/2016  3:36 PM 01/15/2016 10:25 PM Full Code [id number]  Demetrios Loll, MD Inpatient   01/26/2015  6:21 PM 02/02/2015  6:18 PM Full Code [id number]  Vaughan Basta, MD ED      TOTAL TIME TAKING CARE OF THIS PATIENT: 35  minutes.    Demetrios Loll M.D on 01/19/2016 at 1:29 PM  Between 7am to 6pm - Pager - [phone]  After 6pm go to [email or web address] - Proofreader  [facility]  Office  [phone]  CC: Primary care physician; [name], MD   Note: This dictation was prepared with Dragon dictation along with smaller phrase technology. Any transcriptional errors that result from this process are unintentional.

## 2016-01-19 NOTE — Progress Notes (Signed)
Physical Therapy Treatment Patient Details Name: GAETON MELGAREJO MRN: [id number] DOB: [birth year] Today's Date: 01/19/2016    History of Present Illness Pt. is a 75 y.o. male who was admitted for Right Hip Fx s/p ORIF repair. PWB at this time    PT Comments    Pt. Sitting in chair upon arrival, agreeable to activity. Pt. Demonstrates sit<>stand transfers from chair minA with use of RW able to perform safe technique, requires assist when transitioning UE from seating surface up to RW Pt. Able to perform approx. 11ft. Of gait minA with use of RW demonstrates very short step length, short step through pattern, decreased stance time on RLE and heavy reliance on RW for BUE support, unable to demonstrate upright posture with verbal cues in standing. Able to maintain PWB throughout all mobility. Recommend use of RW for all mobility at this time. Would benefit from skilled PT to address above deficits and promote optimal return to PLOF Continue to recommend SNF placement upon d/c to follow up with further skilled PT needs.    Follow Up Recommendations  SNF (Pt. refuses )     Equipment Recommendations  Rolling walker with 5" wheels    Recommendations for Other Services       Precautions / Restrictions Precautions Precautions: Fall Restrictions Weight Bearing Restrictions: Yes RLE Weight Bearing: Partial weight bearing    Mobility  Bed Mobility Overal bed mobility:  (Pt. in chair upon arrival, returned to chair at end of session)                Transfers Overall transfer level: Needs assistance Equipment used: Rolling walker (2 wheeled) Transfers: Sit to/from Stand Sit to Stand: Min assist         General transfer comment: Pt. demonstrates L lateral lean in sitting, maintains through transfer, Very slow transition through movement, able to perform safe technique with verbal cues, requires assist when transitioniong UE from seating surface to  RW  Ambulation/Gait Ambulation/Gait assistance: Min assist Ambulation Distance (Feet): 30 Feet Assistive device: Rolling walker (2 wheeled)       General Gait Details: Pt. performed full distance with RW, demonstrates very small step length, short step through pattern, decreased stance time through RLE, heavy reliance on RW for B UE assist, unable to achieve upright posture with verbal cues.   Stairs            Wheelchair Mobility    Modified Rankin (Stroke Patients Only)       Balance Overall balance assessment: Needs assistance Sitting-balance support: Feet supported       Standing balance support: Bilateral upper extremity supported Standing balance-Leahy Scale: Fair Standing balance comment: Pt unstable when standing without B UE support.                     Cognition Arousal/Alertness: Awake/alert Behavior During Therapy: WFL for tasks assessed/performed Overall Cognitive Status: Within Functional Limits for tasks assessed                      Exercises      General Comments        Pertinent Vitals/Pain Pain Assessment: 0-10 Pain Score: 3  Pain Location: R hip/LE  Pain Intervention(s): Monitored during session;Patient requesting pain meds-RN notified;Ice applied    Home Living                      Prior Function  PT Goals (current goals can now be found in the care plan section) Acute Rehab PT Goals Patient Stated Goal: To return home with familiy today. PT Goal Formulation: With patient Time For Goal Achievement: 02/01/16 Potential to Achieve Goals: Fair Progress towards PT goals: Progressing toward goals    Frequency    BID      PT Plan Current plan remains appropriate    Co-evaluation             End of Session Equipment Utilized During Treatment: Gait belt Activity Tolerance: Patient tolerated treatment well Patient left: in chair;with chair alarm set;with call bell/phone within  reach     Time: [id number] PT Time Calculation (min) (ACUTE ONLY): 15 min  Charges:                       G Codes:       Melanie Crazier, SPT  01/19/16,4:16 PM

## 2016-01-19 NOTE — Progress Notes (Signed)
Occupational Therapy Treatment Patient Details Name: CHRISTO WITTS MRN: [id number] DOB: Apr 09, 1941 Today's Date: 01/19/2016    History of present illness Pt. is a 75 y.o. male who was admitted for Tight Hip Fx s/p ORIF repair.   OT comments  Pt. Is making progress, and demonstrates proper use of A/E with cues. Pt. continues to benefit from skilled OT services for ADL and IADL functioning. Pt. Pain level is 3/10. Pt. Is planning to return home today with his wife. Pt. Could benefit from follow-up [place] services.   Follow Up Recommendations  Home health OT    Equipment Recommendations       Recommendations for Other Services      Precautions / Restrictions Precautions Precautions: Fall Restrictions Weight Bearing Restrictions: Yes RLE Weight Bearing: Partial weight bearing              ADL Overall ADL's : Needs assistance/impaired Eating/Feeding: Set up   Grooming: Set up               Lower Body Dressing: Minimal assistance               Functional mobility during ADLs: Set up General ADL Comments: Pt. education was provided about A/E for LE ADLs.      Vision                     Perception     Praxis      Cognition   Behavior During Therapy: [facility] for tasks assessed/performed Overall Cognitive Status: Within Functional Limits for tasks assessed                       Extremity/Trunk Assessment               Exercises   Shoulder Instructions   General Comments      Pertinent Vitals/ Pain       Pain Assessment: 3/10 Pain Score: 3  Pain Location: Right Hip  Pain Intervention(s): Monitored during session;Repositioned;Ice applied  Home Living                                          Prior Functioning/Environment              Frequency  Min 1X/week        Progress Toward Goals  OT Goals(current goals can now be found in the care plan section)  Progress towards OT goals: Progressing  toward goals  Acute Rehab OT Goals Patient Stated Goal: To return home with familiy today. OT Goal Formulation: With patient Potential to Achieve Goals: Good  Plan      Co-evaluation                 End of Session Equipment Utilized During Treatment: Gait belt   Activity Tolerance Patient tolerated treatment well   Patient Left In chair, with call bell in reach, and alarm set.   Nurse Communication          Time: [id number] OT Time Calculation (min): 23 min  Charges: OT General Charges $OT Visit: 1 Procedure OT Treatments $Self Care/Home Management : 23-37 mins  Harrel Carina, MS, OTR/L 01/19/2016, 11:45 AM

## 2016-01-19 NOTE — Care Management Important Message (Signed)
Important Message  Patient Details  Name: NICKOLOUS SOMOGYI MRN: [id number] Date of Birth: 1941/04/04   Medicare Important Message Given:  Yes    Marshell Garfinkel, RN 01/19/2016, 8:05 AM

## 2016-01-19 NOTE — Progress Notes (Signed)
Physical Therapy Treatment Patient Details Name: Jeremy Gordon MRN: [id number] DOB: 08/01/1940 Today's Date: 01/19/2016    History of Present Illness Pt. is a 75 y.o. male who was admitted for ORIF repair of a Right Hip Fracture. PWB at this time    PT Comments    Pt. Supine in bed upon arrival, requests use of BSC. Pt. Requires mod A for bed mobility including support/negotiation of RLE and trunk support, able to assist with use of bedrails, Performed multiple sit<>stand from EOB and BSC with use of RW for transfer and repositioning with min A, demonstrates safe technique with verbal cue reminders. Pt. States he has a preference to use crutches vs. RW performed approx. 35ft of ambulation with crutches mod A +2 for safety/chair follow pt. Demonstrates 4pt. step to Pattern, very slow movements, inability to achieve upright posture requires max verbal cues for stepping and crutch negotiation. Performed additional 68ft. Of ambulation with use of RW min A +2 for safety/chair follow, pt. Demonstrates improved stability vs. Crutches, able to demonstrate small step length step through pattern, increased cadence, able to maintain PWB throughout. Recommend use of RW for all mobility at this time. Would benefit from skilled PT to address above deficits and promote optimal return to PLOF Recommend SNF placement at d/c for further PT needs.    Follow Up Recommendations  SNF (pt. refuses)     Equipment Recommendations  Rolling walker with 5" wheels    Recommendations for Other Services       Precautions / Restrictions Precautions Precautions: Fall Restrictions Weight Bearing Restrictions: Yes RLE Weight Bearing: Partial weight bearing    Mobility  Bed Mobility Overal bed mobility: Needs Assistance;+ 2 for safety/equipment Bed Mobility: Supine to Sit     Supine to sit: Mod assist     General bed mobility comments: Pt. requires assist for RLE support/negotiation, able to provide assist for  trunk from B UE with use of bedrail  Transfers Overall transfer level: Needs assistance Equipment used: Rolling walker (2 wheeled) Transfers: Sit to/from Stand Sit to Stand: Min assist         General transfer comment: very slow transition through movement, able to perform with safe technique with verbal cues. leans toward L side in guarding of R hip  Ambulation/Gait Ambulation/Gait assistance: Mod assist;+2 safety/equipment Ambulation Distance (Feet): 10 Feet Assistive device: Crutches       General Gait Details: Pt. states that he prefers use of crutches. Pt. requires max verbal cues for stepping/crutch negotiation, unable to achieve upright posture throughout gait distance, demonstrates 4point step to pattern with R LE leading.   Stairs            Wheelchair Mobility    Modified Rankin (Stroke Patients Only)       Balance Overall balance assessment: Needs assistance Sitting-balance support: Bilateral upper extremity supported;Feet supported Sitting balance-Leahy Scale: Good Sitting balance - Comments: Able to perform good sitting balance on multiple surfaces; EOB, commode, and recliner   Standing balance support: Bilateral upper extremity supported Standing balance-Leahy Scale: Fair Standing balance comment: Requires B UE support to maintain standing balance                    Cognition Arousal/Alertness: Awake/alert Behavior During Therapy: WFL for tasks assessed/performed Overall Cognitive Status: Within Functional Limits for tasks assessed                      Exercises Other Exercises Other Exercises:  Pt. required use of BSC during session; performed multiple sit<>stands from EOB to [facility] and then to/from [facility] for reposistioning, able to perform safe sit<>stand technique with verbal cues and min A.  Other Exercises: Performed approx. 32ft. of gait min A with use of RW for B UE support +2 for safety/chair follow. Pt. required standing break  x2, able to perform small length step through pattern with use of RW, improved cadence speed and stability relative to gait with use of crutches. Pt. able to maintain PWB throughout distance.    General Comments        Pertinent Vitals/Pain Pain Assessment: 0-10 Pain Score: 3  Pain Location: R hip/LE Pain Intervention(s): Monitored during session;Limited activity within patient's tolerance;Repositioned;Ice applied    Home Living                      Prior Function            PT Goals (current goals can now be found in the care plan section) Acute Rehab PT Goals Patient Stated Goal: To return home PT Goal Formulation: With patient Time For Goal Achievement: 02/01/16 Potential to Achieve Goals: Fair Progress towards PT goals: Progressing toward goals    Frequency    BID      PT Plan Current plan remains appropriate    Co-evaluation             End of Session Equipment Utilized During Treatment: Gait belt Activity Tolerance: Patient tolerated treatment well       Time: [id number] PT Time Calculation (min) (ACUTE ONLY): 40 min  Charges:                       G Codes:       Melanie Crazier, SPT  01/19/16,1:42 PM

## 2016-01-19 NOTE — Progress Notes (Signed)
Subjective: 2 Days Post-Op Procedure(s) (LRB): INTRAMEDULLARY (IM) NAIL FEMORAL (Right)    Patient reports pain as mild. Eager to go home.  Dressing dry.  csm good.   Objective:   VITALS:   Vitals:   01/19/16 0755 01/19/16 0932  BP: (!) 151/99 128/78  Pulse: (!) 51 79  Resp: 18   Temp: 97.6 F (36.4 C)     Neurologically intact ABD soft Neurovascular intact Sensation intact distally Intact pulses distally Dorsiflexion/Plantar flexion intact Incision: no drainage  LABS  Recent Labs  01/17/16 0438 01/18/16 0343 01/19/16 0359  HGB 13.0 12.5* 13.0  HCT 39.4* 37.5* 38.8*  WBC 8.2 13.4* 9.9  PLT 139* 144* 145*     Recent Labs  01/17/16 0438 01/18/16 0343 01/19/16 0359  NA  --  138 139  K  --  3.7 3.8  BUN  --  18 22*  CREATININE 0.82 0.81 0.76  GLUCOSE  --  146* 102*     Recent Labs  01/18/16 0343 01/19/16 0359  INR 1.27 1.20     Assessment/Plan: 2 Days Post-Op Procedure(s) (LRB): INTRAMEDULLARY (IM) NAIL FEMORAL (Right)   Up with therapy Discharge home with home health  RTC 1 week

## 2016-01-20 ENCOUNTER — Telehealth: Payer: Self-pay | Admitting: *Deleted

## 2016-01-20 NOTE — Telephone Encounter (Signed)
Pt's sister, Enid Derry, left message with Elliott's office that needs to have endoscopy. Pt missed last appt with Vira Agar on 9/11 to schedule endoscopy. Elliott's office needs clarification if scope is needed. Informed Levada Dy that will see pt on 10/11 and then will arrange for appt with Vira Agar to see for EGD. Per Levada Dy, next available appt for routine EGD is at end of December. If MD wants prior to that then needs to indicate in note that EGD is needed urgently. MD aware.

## 2016-01-26 NOTE — Progress Notes (Signed)
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  INTERVAL HISTORY: Patient returns to clinic today for further evaluation. He was recently in the hospital for a broken right hip. He reports occasional dysphagia and difficulty swallowing but continues to have a good appetite. He does not complain of reflux today.  He has no neurologic complaints. He denies any shortness of breath or chest pain. He denies any nausea, vomiting, constipation, or diarrhea. He has no melena or hematochezia. He has no urinary complaints. Patient offers no further specific complaints today.  REVIEW OF SYSTEMS:   Review of Systems  Constitutional: Negative for fever, malaise/fatigue and weight loss.  Respiratory: Negative.  Negative for shortness of breath.   Cardiovascular: Negative for chest pain.  Gastrointestinal: Negative for abdominal pain, blood in stool, melena and nausea.  Genitourinary: Negative.   Musculoskeletal: Positive for joint pain.  Neurological: Negative.  Negative for weakness.  Endo/Heme/Allergies: Does not bruise/bleed easily.  Psychiatric/Behavioral: Negative.  The patient is not nervous/anxious.     As per HPI. Otherwise, a complete review of systems is negative.   PAST MEDICAL HISTORY:  Hypertension, sleep apnea, atrial fibrillation.  PAST SURGICAL HISTORY: Negative.  FAMILY HISTORY: Reviewed and unchanged. No report of malignancy or chronic disease.     ADVANCED DIRECTIVES:    HEALTH MAINTENANCE: Social History  Substance Use Topics  . Smoking status: Current Some Day Smoker    Years: 62.00  . Smokeless tobacco: Current User   Types: Snuff  . Alcohol use No     No Known Allergies  Current Outpatient Prescriptions  Medication Sig Dispense Refill  . albuterol (PROVENTIL HFA;VENTOLIN HFA) 108 (90 Base) MCG/ACT inhaler Inhale into the lungs.    . chlorpheniramine-HYDROcodone (TUSSIONEX) 10-8 MG/5ML SUER TAKE 5ML BY MOUTH EVERY TWELVE HOURS AS NEEDED FOR COUGH  0  . digoxin (LANOXIN) 0.125 MG tablet Take by mouth.    . diltiazem (CARDIZEM CD) 120 MG 24 hr capsule     . diphenoxylate-atropine (LOMOTIL) 2.5-0.025 MG tablet Take by mouth.    . enalapril (VASOTEC) 20 MG tablet Take by mouth.    . finasteride (PROSCAR) 5 MG tablet Take 5 mg by mouth daily.    . fluticasone (FLONASE) 50 MCG/ACT nasal spray ONE SPRAY IN EACH NOSTRIL AT NIGHT AS DIRECTED  11  . fluticasone (FLOVENT HFA) 220 MCG/ACT inhaler Inhale into the lungs.    . furosemide (LASIX) 20 MG tablet Take 20 mg by mouth.    . gabapentin (NEURONTIN) 300 MG capsule Take 300 mg by mouth 2 (two) times daily.     Marland Kitchen HYDROcodone-acetaminophen (NORCO/VICODIN) 5-325 MG tablet Take 1 tablet by mouth every 6 (six) hours as needed for moderate pain. 12 tablet 0  . metoprolol (LOPRESSOR) 50 MG tablet Take 50 mg by mouth.    . nystatin (MYCOSTATIN) 100000 UNIT/ML suspension     . omeprazole (PRILOSEC) 20 MG capsule Take 1 capsule (20 mg total) by mouth daily. 30 capsule 2  . predniSONE (DELTASONE) 20 MG tablet Take 1 tablet (20 mg total) by mouth daily. 7 tablet 0  . prochlorperazine (COMPAZINE) 10 MG tablet Take 1 tablet (10 mg total) by mouth every 6 (six) hours as  needed for nausea or vomiting. 90 tablet 5  . senna (SENOKOT) 8.6 MG TABS tablet Take 1 tablet (8.6 mg total) by mouth 2 (two) times daily. 14 each 0  . sucralfate (CARAFATE) 1 GM/10ML suspension Take 10 mLs (1 g total) by mouth 4 (four) times daily -  with meals and at bedtime. 420 mL 1  . tamsulosin (FLOMAX) 0.4 MG CAPS capsule Take 0.4 mg by mouth daily.  3  . warfarin (COUMADIN) 4 MG tablet Take 2 tablets  by mouth with evening meal     No current facility-administered medications for this visit.    Facility-Administered Medications Ordered in Other Visits  Medication Dose Route Frequency Provider Last Rate Last Dose  . sodium chloride 0.9 % injection 10 mL  10 mL Intravenous PRN Lloyd Huger, MD   10 mL at 12/23/14 0945  . sodium chloride 0.9 % injection 10 mL  10 mL Intravenous PRN Lloyd Huger, MD   10 mL at 01/15/15 1512  . sodium chloride flush (NS) 0.9 % injection 10 mL  10 mL Intravenous PRN Lloyd Huger, MD   10 mL at 11/10/15 0939  . sodium chloride flush (NS) 0.9 % injection 10 mL  10 mL Intravenous PRN Lloyd Huger, MD   10 mL at 01/27/16 1121    OBJECTIVE: Vitals:   01/27/16 1027  BP: 115/64  Pulse: 60  Resp: 18  Temp: 98.5 F (36.9 C)     There is no height or weight on file to calculate BMI.    ECOG FS:1 - Symptomatic but completely ambulatory  General: Well-developed, well-nourished, no acute distress. Eyes: anicteric sclera. HEENT: Oropharynx clear without exudate or erythema. No palpable lymphadenopathy. Lungs: Clear to auscultation bilaterally. Heart: Regular rate and rhythm. No rubs, murmurs, or gallops. Abdomen: Soft, nontender, nondistended. No organomegaly noted, normoactive bowel sounds. Musculoskeletal: No edema, cyanosis, or clubbing. Neuro: Alert, answering all questions appropriately. Cranial nerves grossly intact. Skin: No rashes or petechiae noted. Psych: Normal affect.   LAB RESULTS:  Lab Results  Component Value Date   NA 139 01/19/2016   K 3.8 01/19/2016   CL 104 01/19/2016   CO2 29 01/19/2016   GLUCOSE 102 (H) 01/19/2016   BUN 22 (H) 01/19/2016   CREATININE 0.76 01/19/2016   CALCIUM 8.5 (L) 01/19/2016   PROT 5.7 (L) 01/15/2016   ALBUMIN 3.4 (L) 01/15/2016   AST 21 01/15/2016   ALT 15 (L) 01/15/2016   ALKPHOS 59 01/15/2016   BILITOT 1.0 01/15/2016   GFRNONAA >60 01/19/2016   GFRAA >60 01/19/2016    Lab  Results  Component Value Date   WBC 9.9 01/19/2016   NEUTROABS 12.4 (H) 01/15/2016   HGB 13.0 01/19/2016   HCT 38.8 (L) 01/19/2016   MCV 90.8 01/19/2016   PLT 145 (L) 01/19/2016     STUDIES: Dg Knee 2 Views Right  Result Date: 01/15/2016 CLINICAL DATA:  Fall today. Right knee pain and inability to walk. Initial encounter. EXAM: RIGHT KNEE - 1-2 VIEW COMPARISON:  None. FINDINGS: No evidence of fracture, dislocation, or joint effusion. Mild to moderate tricompartmental osteoarthritis. Generalized osteopenia. No focal lytic or sclerotic bone lesions. Peripheral vascular calcification. IMPRESSION: No acute findings. Mild to moderate tricompartmental osteoarthritis. Electronically Signed   By: Earle Gell M.D.   On: 01/15/2016 13:21   Ct Hip Right Wo Contrast  Result Date: 01/15/2016 CLINICAL DATA:  Status post fall.  Right hip pain. EXAM: CT OF THE RIGHT HIP WITHOUT CONTRAST  TECHNIQUE: Multidetector CT imaging of the right hip was performed according to the standard protocol. Multiplanar CT image reconstructions were also generated. COMPARISON:  None. FINDINGS: Bones/Joint/Cartilage Severe osteopenia. Impacted nondisplaced right subcapital fracture. Additionally, acute intertrochanteric fracture with mild displacement. Mild osteoarthritis of the right hip. Mild osteoarthritis of the right sacroiliac joint. No aggressive lytic or sclerotic osseous lesion. Ligaments Suboptimally assessed by CT. Muscles and Tendons Muscles are normal.  No intramuscular hematoma or fluid collection. Soft tissues No fluid collection or hematoma. Peripheral vascular atherosclerotic disease. IMPRESSION: 1. Acute, impacted nondisplaced right subcapital fracture. Acute intertrochanteric fracture with mild displacement. Electronically Signed   By: Kathreen Devoid   On: 01/15/2016 15:42   Dg Chest Port 1 View  Result Date: 01/15/2016 CLINICAL DATA:  Patient had fall today and has pain of the right hip and right knee since fall  and is unable to walk. Best images possible for patient condition at this time. EXAM: PORTABLE CHEST - 1 VIEW COMPARISON:  PET-CT 09/18/2015 and previous FINDINGS: Lungs are clear. Mild cardiomegaly. Atheromatous aorta. Stable right IJ port catheter to the cavoatrial junction. No effusion. Visualized bones unremarkable. IMPRESSION: 1. Stable mild cardiomegaly.  No acute disease. 2. Aortic Atherosclerosis (ICD10-170.0) Electronically Signed   By: Lucrezia Europe M.D.   On: 01/15/2016 13:25   Dg Hip Operative Unilat W Or W/o Pelvis Right  Result Date: 01/17/2016 CLINICAL DATA:  ORIF of right hip fracture. EXAM: OPERATIVE RIGHT HIP (WITH PELVIS IF PERFORMED) 3 VIEWS TECHNIQUE: Fluoroscopic spot image(s) were submitted for interpretation post-operatively. COMPARISON:  Right hip CT - 01/15/2016 ; right hip radiographs - 01/14/2026 FINDINGS: Three spot intraoperative fluoroscopic images of the right hip are provided for review Images demonstrate the sequela of intra medullary rod fixation of the proximal femur. The intra medullary rod is transfixed with a single cancellous screw. Post dynamic screw fixation of the right femoral neck. There is minimal residual displacement of the right greater trochanter though overall alignment appears improved. There is subcutaneous emphysema about the operative site. No radiopaque foreign body. IMPRESSION: Post ORIF of the right femur and femoral neck without evidence of complication. Electronically Signed   By: Sandi Mariscal M.D.   On: 01/17/2016 09:32   Dg Hip Unilat With Pelvis 2-3 Views Right  Result Date: 01/15/2016 CLINICAL DATA:  Fall today. Right hip pain and inability to ambulate. Initial encounter. EXAM: DG HIP (WITH OR WITHOUT PELVIS) 2-3V RIGHT COMPARISON:  None. FINDINGS: Mildly displaced intertrochanteric right hip fracture is seen. No evidence of dislocation. Mild right hip osteoarthritis and severe left hip osteoarthritis noted. No evidence of acetabular or pelvic  fracture. IMPRESSION: Mildly displaced intertrochanteric right hip fracture. Bilateral hip osteoarthritis, left side greater than right. Electronically Signed   By: Earle Gell M.D.   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  PLAN:    1. 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 Patient is not a surgical candidate and no longer can receive XRT. Patient missed both his GI and PET appointment secondary to his broken hip and these have been rescheduled. Return to clinic in several weeks to discuss the results and further evaluation.  2. Hip fracture: Continued evaluation and treatment per orthopedics. 3. Anemia: Resolved.  Patient expressed understanding and was in agreement with this plan. He also understands that He can call clinic at any time  with any questions, concerns, or complaints.    Lloyd Huger, MD   01/27/2016 12:32 PM

## 2016-01-27 ENCOUNTER — Inpatient Hospital Stay: Payer: Medicare Other

## 2016-01-27 ENCOUNTER — Inpatient Hospital Stay: Payer: Medicare Other | Attending: Oncology | Admitting: Oncology

## 2016-01-27 VITALS — BP 115/64 | HR 60 | Temp 98.5°F | Resp 18

## 2016-01-27 DIAGNOSIS — G4730 Sleep apnea, unspecified: Secondary | ICD-10-CM | POA: Insufficient documentation

## 2016-01-27 DIAGNOSIS — F17210 Nicotine dependence, cigarettes, uncomplicated: Secondary | ICD-10-CM

## 2016-01-27 DIAGNOSIS — M160 Bilateral primary osteoarthritis of hip: Secondary | ICD-10-CM | POA: Diagnosis not present

## 2016-01-27 DIAGNOSIS — Z79899 Other long term (current) drug therapy: Secondary | ICD-10-CM | POA: Diagnosis not present

## 2016-01-27 DIAGNOSIS — Z452 Encounter for adjustment and management of vascular access device: Secondary | ICD-10-CM | POA: Diagnosis not present

## 2016-01-27 DIAGNOSIS — I4891 Unspecified atrial fibrillation: Secondary | ICD-10-CM | POA: Insufficient documentation

## 2016-01-27 DIAGNOSIS — Z7952 Long term (current) use of systemic steroids: Secondary | ICD-10-CM | POA: Diagnosis not present

## 2016-01-27 DIAGNOSIS — M8580 Other specified disorders of bone density and structure, unspecified site: Secondary | ICD-10-CM | POA: Diagnosis not present

## 2016-01-27 DIAGNOSIS — Z7951 Long term (current) use of inhaled steroids: Secondary | ICD-10-CM

## 2016-01-27 DIAGNOSIS — I10 Essential (primary) hypertension: Secondary | ICD-10-CM | POA: Diagnosis not present

## 2016-01-27 DIAGNOSIS — M1710 Unilateral primary osteoarthritis, unspecified knee: Secondary | ICD-10-CM

## 2016-01-27 DIAGNOSIS — C155 Malignant neoplasm of lower third of esophagus: Secondary | ICD-10-CM | POA: Diagnosis not present

## 2016-01-27 DIAGNOSIS — Z7901 Long term (current) use of anticoagulants: Secondary | ICD-10-CM | POA: Insufficient documentation

## 2016-01-27 DIAGNOSIS — M2550 Pain in unspecified joint: Secondary | ICD-10-CM | POA: Insufficient documentation

## 2016-01-27 DIAGNOSIS — S72001S Fracture of unspecified part of neck of right femur, sequela: Secondary | ICD-10-CM | POA: Insufficient documentation

## 2016-01-27 DIAGNOSIS — Z95828 Presence of other vascular implants and grafts: Secondary | ICD-10-CM

## 2016-01-27 DIAGNOSIS — K219 Gastro-esophageal reflux disease without esophagitis: Secondary | ICD-10-CM | POA: Insufficient documentation

## 2016-01-27 DIAGNOSIS — Z9221 Personal history of antineoplastic chemotherapy: Secondary | ICD-10-CM | POA: Insufficient documentation

## 2016-01-27 DIAGNOSIS — R1310 Dysphagia, unspecified: Secondary | ICD-10-CM | POA: Diagnosis not present

## 2016-01-27 MED ORDER — SODIUM CHLORIDE 0.9% FLUSH
10.0000 mL | INTRAVENOUS | Status: DC | PRN
  Administered 2016-01-27: 10 mL via INTRAVENOUS
  Filled 2016-01-27: qty 10

## 2016-01-27 MED ORDER — HEPARIN SOD (PORK) LOCK FLUSH 100 UNIT/ML IV SOLN
500.0000 [IU] | Freq: Once | INTRAVENOUS | Status: AC
  Administered 2016-01-27: 500 [IU] via INTRAVENOUS

## 2016-01-27 NOTE — Progress Notes (Signed)
States is having right hip pain.

## 2016-01-29 ENCOUNTER — Ambulatory Visit: Payer: Medicare Other

## 2016-02-08 ENCOUNTER — Telehealth: Payer: Self-pay | Admitting: Oncology

## 2016-02-08 NOTE — Telephone Encounter (Signed)
Patient needs to be rescheduled for endoscopy with [facility], notification sent to Dr. Percell Boston office. Patient is scheduled for PET scan and MD follow up next week, needs to keep these appointments.

## 2016-02-08 NOTE — Telephone Encounter (Signed)
She said he had his stitches out and wants to talk to Dr. Gary Fleet team about rescheduling some procedures they had cancelled due to his hip being broken. Please call: [phone]. Thanks.

## 2016-02-09 ENCOUNTER — Inpatient Hospital Stay: Payer: Medicare Other

## 2016-02-09 DIAGNOSIS — C155 Malignant neoplasm of lower third of esophagus: Secondary | ICD-10-CM | POA: Diagnosis not present

## 2016-02-09 MED ORDER — SODIUM CHLORIDE 0.9% FLUSH
10.0000 mL | Freq: Once | INTRAVENOUS | Status: AC
  Administered 2016-02-09: 10 mL via INTRAVENOUS
  Filled 2016-02-09: qty 10

## 2016-02-09 MED ORDER — HEPARIN SOD (PORK) LOCK FLUSH 100 UNIT/ML IV SOLN
INTRAVENOUS | Status: AC
  Filled 2016-02-09: qty 5

## 2016-02-09 MED ORDER — HEPARIN SOD (PORK) LOCK FLUSH 100 UNIT/ML IV SOLN
500.0000 [IU] | Freq: Once | INTRAVENOUS | Status: AC
  Administered 2016-02-09: 500 [IU] via INTRAVENOUS

## 2016-02-09 MED ORDER — SODIUM CHLORIDE 0.9 % IV SOLN
Freq: Once | INTRAVENOUS | Status: AC
  Administered 2016-02-09: 14:00:00 via INTRAVENOUS
  Filled 2016-02-09: qty 1000

## 2016-02-10 ENCOUNTER — Ambulatory Visit
Admission: RE | Admit: 2016-02-10 | Discharge: 2016-02-10 | Disposition: A | Discharge status: Home / Self Care | Payer: Medicare Other | Source: Ambulatory Visit | Attending: Oncology | Admitting: Oncology

## 2016-02-10 DIAGNOSIS — C155 Malignant neoplasm of lower third of esophagus: Secondary | ICD-10-CM

## 2016-02-11 ENCOUNTER — Ambulatory Visit
Admission: RE | Admit: 2016-02-11 | Discharge: 2016-02-11 | Disposition: A | Discharge status: Home / Self Care | Payer: Medicare Other | Source: Ambulatory Visit | Attending: Oncology | Admitting: Oncology

## 2016-02-11 DIAGNOSIS — C155 Malignant neoplasm of lower third of esophagus: Secondary | ICD-10-CM | POA: Diagnosis present

## 2016-02-11 LAB — GLUCOSE, CAPILLARY: Glucose-Capillary: 92 mg/dL (ref 65–99)

## 2016-02-11 MED ORDER — FLUDEOXYGLUCOSE F - 18 (FDG) INJECTION
12.5300 | Freq: Once | INTRAVENOUS | Status: AC | PRN
  Administered 2016-02-11: 12.53 via INTRAVENOUS

## 2016-02-14 SURGERY — FIXATION, FRACTURE, INTERTROCHANTERIC, WITH INTRAMEDULLARY ROD
Anesthesia: Choice | Laterality: Right

## 2016-02-14 NOTE — Progress Notes (Signed)
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  INTERVAL HISTORY: Patient returns to clinic today for further evaluation and discussion of his imaging results. He is nearly recovered from his broken right hip. He continues to have occasional dysphagia and difficulty swallowing but continues to have a good appetite. He states his reflux is worse.  He has no neurologic complaints. He denies any shortness of breath or chest pain. He denies any nausea, vomiting, constipation, or diarrhea. He has no melena or hematochezia. He has no urinary complaints. Patient offers no further specific complaints today.  REVIEW OF SYSTEMS:   Review of Systems  Constitutional: Negative for fever, malaise/fatigue and weight loss.  Respiratory: Negative.  Negative for shortness of breath.   Cardiovascular: Negative for chest pain.  Gastrointestinal: Positive for heartburn. Negative for abdominal pain, blood in stool, melena and nausea.  Genitourinary: Negative.   Musculoskeletal: Positive for joint pain.  Neurological: Negative.  Negative for weakness.  Endo/Heme/Allergies: Does not bruise/bleed easily.  Psychiatric/Behavioral: Negative.  The patient is not nervous/anxious.     As per HPI. Otherwise, a complete review of systems is negative.   PAST MEDICAL HISTORY:  Hypertension, sleep apnea, atrial fibrillation.  PAST SURGICAL HISTORY: Negative.  FAMILY HISTORY: Reviewed and unchanged. No report of malignancy or chronic disease.     ADVANCED DIRECTIVES:    HEALTH MAINTENANCE: Social History  Substance Use Topics  . Smoking status: Current Some Day Smoker   Years: 62.00  . Smokeless tobacco: Current User    Types: Snuff  . Alcohol use No     No Known Allergies  Current Outpatient Prescriptions  Medication Sig Dispense Refill  . albuterol (PROVENTIL HFA;VENTOLIN HFA) 108 (90 Base) MCG/ACT inhaler Inhale 1-2 puffs into the lungs every 6 (six) hours as needed.     . chlorpheniramine-HYDROcodone (TUSSIONEX) 10-8 MG/5ML SUER TAKE 5ML BY MOUTH EVERY TWELVE HOURS AS NEEDED FOR COUGH  0  . digoxin (LANOXIN) 0.125 MG tablet Take 0.125 mg by mouth daily.     Marland Kitchen diltiazem (CARDIZEM CD) 120 MG 24 hr capsule Take 120 mg by mouth daily.     . enalapril (VASOTEC) 20 MG tablet Take 20 mg by mouth daily.     . finasteride (PROSCAR) 5 MG tablet Take 5 mg by mouth daily.    . fluticasone (FLONASE) 50 MCG/ACT nasal spray ONE SPRAY IN EACH NOSTRIL AT NIGHT AS DIRECTED  11  . fluticasone (FLOVENT HFA) 220 MCG/ACT inhaler Inhale into the lungs.    . furosemide (LASIX) 20 MG tablet Take 20 mg by mouth.    . gabapentin (NEURONTIN) 300 MG capsule Take 300 mg by mouth 2 (two) times daily.     Marland Kitchen HYDROcodone-acetaminophen (NORCO/VICODIN) 5-325 MG tablet Take 1 tablet by mouth every 6 (six) hours as needed for moderate pain. 12 tablet 0  . metoprolol (LOPRESSOR) 50 MG tablet Take 50 mg by mouth 2 (two) times daily.     Marland Kitchen omeprazole (PRILOSEC) 20 MG capsule Take 1 capsule (20 mg total) by mouth daily. 30 capsule 2  . predniSONE (DELTASONE) 20 MG tablet Take 1 tablet (20 mg total) by mouth daily. 7 tablet 0  . prochlorperazine (COMPAZINE) 10 MG  tablet Take 1 tablet (10 mg total) by mouth every 6 (six) hours as needed for nausea or vomiting. 90 tablet 5  . senna (SENOKOT) 8.6 MG TABS tablet Take 1 tablet (8.6 mg total) by mouth 2 (two) times daily. 14 each 0  . tamsulosin (FLOMAX) 0.4 MG CAPS capsule Take 0.4 mg by mouth daily.  3  . warfarin (COUMADIN) 4 MG tablet Take 2 tablets by mouth with evening meal     No current facility-administered medications for this visit.      Facility-Administered Medications Ordered in Other Visits  Medication Dose Route Frequency Provider Last Rate Last Dose  . sodium chloride 0.9 % injection 10 mL  10 mL Intravenous PRN Lloyd Huger, MD   10 mL at 12/23/14 0945  . sodium chloride 0.9 % injection 10 mL  10 mL Intravenous PRN Lloyd Huger, MD   10 mL at 01/15/15 1512  . sodium chloride flush (NS) 0.9 % injection 10 mL  10 mL Intravenous PRN Lloyd Huger, MD   10 mL at 11/10/15 0939    OBJECTIVE: Vitals:   02/15/16 1131  BP: 119/77  Pulse: 76  Resp: 18  Temp: 97.5 F (36.4 C)     Body mass index is 33.82 kg/m.    ECOG FS:1 - Symptomatic but completely ambulatory  General: Well-developed, well-nourished, no acute distress. Eyes: anicteric sclera. HEENT: Oropharynx clear without exudate or erythema. No palpable lymphadenopathy. Lungs: Clear to auscultation bilaterally. Heart: Regular rate and rhythm. No rubs, murmurs, or gallops. Abdomen: Soft, nontender, nondistended. No organomegaly noted, normoactive bowel sounds. Musculoskeletal: No edema, cyanosis, or clubbing. Neuro: Alert, answering all questions appropriately. Cranial nerves grossly intact. Skin: No rashes or petechiae noted. Psych: Normal affect.   LAB RESULTS:  Lab Results  Component Value Date   NA 137 02/15/2016   K 3.8 02/15/2016   CL 101 02/15/2016   CO2 25 02/15/2016   GLUCOSE 114 (H) 02/15/2016   BUN 18 02/15/2016   CREATININE 1.00 02/15/2016   CALCIUM 8.8 (L) 02/15/2016   PROT 6.2 (L) 02/15/2016   ALBUMIN 3.4 (L) 02/15/2016   AST 22 02/15/2016   ALT 14 (L) 02/15/2016   ALKPHOS 112 02/15/2016   BILITOT 0.4 02/15/2016   GFRNONAA >60 02/15/2016   GFRAA >60 02/15/2016    Lab Results  Component Value Date   WBC 6.5 02/15/2016   NEUTROABS 4.2 02/15/2016   HGB 12.8 (L) 02/15/2016   HCT 38.3 (L) 02/15/2016   MCV 87.1 02/15/2016   PLT 193 02/15/2016     STUDIES: Nm Pet Image Restag (ps) Skull Base To  Thigh  Result Date: 02/11/2016 CLINICAL DATA:  Subsequent treatment strategy for esophageal cancer. EXAM: NUCLEAR MEDICINE PET SKULL BASE TO THIGH TECHNIQUE: 12.53 mCi F-18 FDG was injected intravenously. Full-ring PET imaging was performed from the skull base to thigh after the radiotracer. CT data was obtained and used for attenuation correction and anatomic localization. FASTING BLOOD GLUCOSE:  Value: 92  mg/dl COMPARISON:  09/18/2015 FINDINGS: NECK No hypermetabolic lymph nodes in the neck. CHEST Hypermetabolic mass involving the distal esophagus measures approximately 7.8 cm and has an SUV max equal to 13.5. On the previous exam this measured 5.6 cm and had an SUV max equal to 13.6. Mild increased radiotracer uptake within the right hilar region has an SUV max equal to 5.17. Previously 4.22. No hypermetabolic mediastinal lymph nodes. No hypermetabolic supraclavicular lymph nodes. No pleural effusion.  No hypermetabolic pulmonary nodule or mass. ABDOMEN/PELVIS  No abnormal hypermetabolic activity within the liver, pancreas, adrenal glands, or spleen. Aortic atherosclerosis noted. No aneurysm. No hypermetabolic lymph nodes in the abdomen or pelvis. As on the prior study, there is focal FDG accumulation in a left inguinal hernia containing only fat. 2.3 cm soft tissue attenuating nodule seen in the subcutaneous fat the left flank region is not substantially changed in the interval. This remains hypermetabolic. The SUV max is equal to 6.2. Previously 4.7. SKELETON No focal hypermetabolic activity to suggest skeletal metastasis. IMPRESSION: 1. Persistent hypermetabolic tumor involving the distal esophagus. Slightly increased in size when compared with the previous exam. The degree of FDG uptake is unchanged. 2. No new or progressive disease outside of the esophagus. 3. Persistent nonspecific and asymmetric increased uptake within the right hilar region. Electronically Signed   By: Kerby Moors M.D.   On:  02/11/2016 14:06   Dg Hip Operative Unilat W Or W/o Pelvis Right  Result Date: 01/17/2016 CLINICAL DATA:  ORIF of right hip fracture. EXAM: OPERATIVE RIGHT HIP (WITH PELVIS IF PERFORMED) 3 VIEWS TECHNIQUE: Fluoroscopic spot image(s) were submitted for interpretation post-operatively. COMPARISON:  Right hip CT - 01/15/2016 ; right hip radiographs - 01/14/2026 FINDINGS: Three spot intraoperative fluoroscopic images of the right hip are provided for review Images demonstrate the sequela of intra medullary rod fixation of the proximal femur. The intra medullary rod is transfixed with a single cancellous screw. Post dynamic screw fixation of the right femoral neck. There is minimal residual displacement of the right greater trochanter though overall alignment appears improved. There is subcutaneous emphysema about the operative site. No radiopaque foreign body. IMPRESSION: Post ORIF of the right femur and femoral neck without evidence of complication. Electronically Signed   By: Sandi Mariscal M.D.   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  PLAN:    1. 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 Patient completed 4 cycles of single agent Taxol on December 15, 2015. Patient is not a surgical candidate and no longer can receive XRT. Patient has an appointment with GI on March 21, 2016. He will follow-up in clinic approximately one week later for further evaluation. Patient first understanding that he will likely need additional chemotherapy in the future.  2. Hip fracture: Continued evaluation and treatment per orthopedics. 3. Anemia: Resolved. 4. Reflux: Continue Prilosec as prescribed.  Patient expressed understanding and was in agreement with this plan. He also understands that He can call clinic at any time with any  questions, concerns, or complaints.    Lloyd Huger, MD   02/16/2016 8:45 AM

## 2016-02-15 ENCOUNTER — Inpatient Hospital Stay (HOSPITAL_BASED_OUTPATIENT_CLINIC_OR_DEPARTMENT_OTHER): Payer: Medicare Other | Admitting: Oncology

## 2016-02-15 ENCOUNTER — Inpatient Hospital Stay: Payer: Medicare Other

## 2016-02-15 VITALS — BP 119/77 | HR 76 | Temp 97.5°F | Resp 18 | Wt 215.9 lb

## 2016-02-15 DIAGNOSIS — S72001S Fracture of unspecified part of neck of right femur, sequela: Secondary | ICD-10-CM

## 2016-02-15 DIAGNOSIS — F17210 Nicotine dependence, cigarettes, uncomplicated: Secondary | ICD-10-CM

## 2016-02-15 DIAGNOSIS — Z7951 Long term (current) use of inhaled steroids: Secondary | ICD-10-CM

## 2016-02-15 DIAGNOSIS — Z7952 Long term (current) use of systemic steroids: Secondary | ICD-10-CM

## 2016-02-15 DIAGNOSIS — Z7901 Long term (current) use of anticoagulants: Secondary | ICD-10-CM

## 2016-02-15 DIAGNOSIS — M1710 Unilateral primary osteoarthritis, unspecified knee: Secondary | ICD-10-CM

## 2016-02-15 DIAGNOSIS — I10 Essential (primary) hypertension: Secondary | ICD-10-CM

## 2016-02-15 DIAGNOSIS — I4891 Unspecified atrial fibrillation: Secondary | ICD-10-CM

## 2016-02-15 DIAGNOSIS — M8580 Other specified disorders of bone density and structure, unspecified site: Secondary | ICD-10-CM

## 2016-02-15 DIAGNOSIS — Z79899 Other long term (current) drug therapy: Secondary | ICD-10-CM

## 2016-02-15 DIAGNOSIS — C159 Malignant neoplasm of esophagus, unspecified: Secondary | ICD-10-CM

## 2016-02-15 DIAGNOSIS — M2550 Pain in unspecified joint: Secondary | ICD-10-CM

## 2016-02-15 DIAGNOSIS — Z452 Encounter for adjustment and management of vascular access device: Secondary | ICD-10-CM | POA: Diagnosis not present

## 2016-02-15 DIAGNOSIS — C155 Malignant neoplasm of lower third of esophagus: Secondary | ICD-10-CM

## 2016-02-15 DIAGNOSIS — Z9221 Personal history of antineoplastic chemotherapy: Secondary | ICD-10-CM

## 2016-02-15 DIAGNOSIS — G4730 Sleep apnea, unspecified: Secondary | ICD-10-CM

## 2016-02-15 DIAGNOSIS — M160 Bilateral primary osteoarthritis of hip: Secondary | ICD-10-CM

## 2016-02-15 DIAGNOSIS — K219 Gastro-esophageal reflux disease without esophagitis: Secondary | ICD-10-CM | POA: Diagnosis not present

## 2016-02-15 DIAGNOSIS — R1310 Dysphagia, unspecified: Secondary | ICD-10-CM

## 2016-02-15 LAB — CBC WITH DIFFERENTIAL/PLATELET
Basophils Absolute: 0 10*3/uL (ref 0–0.1)
Basophils Relative: 0 %
EOS ABS: 0.2 10*3/uL (ref 0–0.7)
EOS PCT: 3 %
HCT: 38.3 % — ABNORMAL LOW (ref 40.0–52.0)
Hemoglobin: 12.8 g/dL — ABNORMAL LOW (ref 13.0–18.0)
LYMPHS ABS: 1.6 10*3/uL (ref 1.0–3.6)
Lymphocytes Relative: 24 %
MCH: 29.2 pg (ref 26.0–34.0)
MCHC: 33.5 g/dL (ref 32.0–36.0)
MCV: 87.1 fL (ref 80.0–100.0)
MONO ABS: 0.5 10*3/uL (ref 0.2–1.0)
Monocytes Relative: 8 %
Neutro Abs: 4.2 10*3/uL (ref 1.4–6.5)
Neutrophils Relative %: 65 %
PLATELETS: 193 10*3/uL (ref 150–440)
RBC: 4.4 MIL/uL (ref 4.40–5.90)
RDW: 16.3 % — AB (ref 11.5–14.5)
WBC: 6.5 10*3/uL (ref 3.8–10.6)

## 2016-02-15 LAB — COMPREHENSIVE METABOLIC PANEL
ALT: 14 U/L — AB (ref 17–63)
AST: 22 U/L (ref 15–41)
Albumin: 3.4 g/dL — ABNORMAL LOW (ref 3.5–5.0)
Alkaline Phosphatase: 112 U/L (ref 38–126)
Anion gap: 11 (ref 5–15)
BILIRUBIN TOTAL: 0.4 mg/dL (ref 0.3–1.2)
BUN: 18 mg/dL (ref 6–20)
CHLORIDE: 101 mmol/L (ref 101–111)
CO2: 25 mmol/L (ref 22–32)
CREATININE: 1 mg/dL (ref 0.61–1.24)
Calcium: 8.8 mg/dL — ABNORMAL LOW (ref 8.9–10.3)
GFR calc Af Amer: >60 mL/min (ref >60)
GLUCOSE: 114 mg/dL — AB (ref 65–99)
Potassium: 3.8 mmol/L (ref 3.5–5.1)
Sodium: 137 mmol/L (ref 135–145)
Total Protein: 6.2 g/dL — ABNORMAL LOW (ref 6.5–8.1)

## 2016-02-15 NOTE — Progress Notes (Signed)
States is feeling well. Has mild knee pain in right knee. Complains of severe heartburn. Pt only taking 20mg  prilosec daily at this time.

## 2016-02-18 ENCOUNTER — Telehealth: Payer: Self-pay | Admitting: *Deleted

## 2016-02-18 NOTE — Telephone Encounter (Signed)
Message printed and handed to Dr Finnegan 

## 2016-02-18 NOTE — Telephone Encounter (Signed)
Called to report that Jeremy Gordon had another choking/ vomiting spell last night and is asking what can be done about it. He did not get his last scope done because of breaking his hip. Please advise

## 2016-02-25 ENCOUNTER — Encounter: Payer: Self-pay | Admitting: *Deleted

## 2016-02-26 ENCOUNTER — Encounter
Admission: RE | Disposition: A | Discharge status: Home / Self Care | Payer: Self-pay | Source: Ambulatory Visit | Attending: Unknown Physician Specialty

## 2016-02-26 ENCOUNTER — Ambulatory Visit: Payer: Medicare Other | Admitting: Anesthesiology

## 2016-02-26 ENCOUNTER — Encounter: Payer: Self-pay | Admitting: *Deleted

## 2016-02-26 ENCOUNTER — Ambulatory Visit
Admission: RE | Admit: 2016-02-26 | Discharge: 2016-02-26 | Disposition: A | Discharge status: Home / Self Care | Payer: Medicare Other | Source: Ambulatory Visit | Attending: Unknown Physician Specialty | Admitting: Unknown Physician Specialty

## 2016-02-26 DIAGNOSIS — G4730 Sleep apnea, unspecified: Secondary | ICD-10-CM | POA: Insufficient documentation

## 2016-02-26 DIAGNOSIS — Z8501 Personal history of malignant neoplasm of esophagus: Secondary | ICD-10-CM | POA: Insufficient documentation

## 2016-02-26 DIAGNOSIS — F17200 Nicotine dependence, unspecified, uncomplicated: Secondary | ICD-10-CM | POA: Insufficient documentation

## 2016-02-26 DIAGNOSIS — Z1211 Encounter for screening for malignant neoplasm of colon: Secondary | ICD-10-CM | POA: Diagnosis not present

## 2016-02-26 DIAGNOSIS — K219 Gastro-esophageal reflux disease without esophagitis: Secondary | ICD-10-CM | POA: Insufficient documentation

## 2016-02-26 DIAGNOSIS — I10 Essential (primary) hypertension: Secondary | ICD-10-CM | POA: Insufficient documentation

## 2016-02-26 DIAGNOSIS — Z79899 Other long term (current) drug therapy: Secondary | ICD-10-CM | POA: Insufficient documentation

## 2016-02-26 DIAGNOSIS — Z7901 Long term (current) use of anticoagulants: Secondary | ICD-10-CM | POA: Insufficient documentation

## 2016-02-26 DIAGNOSIS — J449 Chronic obstructive pulmonary disease, unspecified: Secondary | ICD-10-CM | POA: Insufficient documentation

## 2016-02-26 DIAGNOSIS — M1990 Unspecified osteoarthritis, unspecified site: Secondary | ICD-10-CM | POA: Diagnosis not present

## 2016-02-26 DIAGNOSIS — C159 Malignant neoplasm of esophagus, unspecified: Secondary | ICD-10-CM | POA: Diagnosis not present

## 2016-02-26 DIAGNOSIS — I481 Persistent atrial fibrillation: Secondary | ICD-10-CM | POA: Insufficient documentation

## 2016-02-26 DIAGNOSIS — Z9221 Personal history of antineoplastic chemotherapy: Secondary | ICD-10-CM | POA: Diagnosis not present

## 2016-02-26 HISTORY — PX: ESOPHAGOGASTRODUODENOSCOPY (EGD) WITH PROPOFOL: SHX5813

## 2016-02-26 HISTORY — DX: Gastro-esophageal reflux disease without esophagitis: K21.9

## 2016-02-26 SURGERY — ESOPHAGOGASTRODUODENOSCOPY (EGD) WITH PROPOFOL
Anesthesia: General

## 2016-02-26 MED ORDER — PHENYLEPHRINE HCL 10 MG/ML IJ SOLN
INTRAMUSCULAR | Status: DC | PRN
  Administered 2016-02-26: 100 ug via INTRAVENOUS

## 2016-02-26 MED ORDER — SODIUM CHLORIDE 0.9 % IV SOLN
INTRAVENOUS | Status: DC
  Administered 2016-02-26: 09:00:00 via INTRAVENOUS

## 2016-02-26 MED ORDER — SODIUM CHLORIDE 0.9 % IV SOLN: INTRAVENOUS | Status: DC

## 2016-02-26 MED ORDER — PROPOFOL 10 MG/ML IV BOLUS
INTRAVENOUS | Status: DC | PRN
  Administered 2016-02-26: 100 mg via INTRAVENOUS

## 2016-02-26 MED ORDER — PROPOFOL 500 MG/50ML IV EMUL
INTRAVENOUS | Status: DC | PRN
  Administered 2016-02-26: 140 ug/kg/min via INTRAVENOUS

## 2016-02-26 MED ORDER — FENTANYL CITRATE (PF) 100 MCG/2ML IJ SOLN
INTRAMUSCULAR | Status: DC | PRN
  Administered 2016-02-26: 50 ug via INTRAVENOUS

## 2016-02-26 MED ORDER — LIDOCAINE 2% (20 MG/ML) 5 ML SYRINGE
INTRAMUSCULAR | Status: DC | PRN
  Administered 2016-02-26: 40 mg via INTRAVENOUS

## 2016-02-26 MED ORDER — MIDAZOLAM HCL 5 MG/5ML IJ SOLN
INTRAMUSCULAR | Status: DC | PRN
  Administered 2016-02-26: 1 mg via INTRAVENOUS

## 2016-02-26 NOTE — H&P (Signed)
Primary Care Physician:  [name], MD Primary Gastroenterologist:  Dr. Vira Agar  Pre-Procedure History & Physical: HPI:  Jeremy Gordon is a 75 y.o. male is here for an endoscopy.   Past Medical History:  Diagnosis Date  . Arthritis   . Atrial fibrillation ([place])   . COPD (chronic obstructive pulmonary disease) ([place])   . Dysrhythmia   . Esophageal carcinoma ([place])    a. Chemo: FOLFIRI  . Essential hypertension   . GERD (gastroesophageal reflux disease)   . History of stress test    a. 06/2003 MV: EF 56%, no ischemia/infarct.  . Persistent atrial fibrillation ([place])    a. 07/2013 Echo: EF 60-65%, mod dil LA, mild TR;  CHA2DS2VASc = 3-->chronic coumadin.  . Sleep apnea     Past Surgical History:  Procedure Laterality Date  . ESOPHAGOGASTRODUODENOSCOPY (EGD) WITH PROPOFOL N/A 08/31/2015   Procedure: ESOPHAGOGASTRODUODENOSCOPY (EGD) WITH PROPOFOL;  Surgeon: Manya Silvas, MD;  Location: [facility] ENDOSCOPY;  Service: Endoscopy;  Laterality: N/A;  . FEMUR IM NAIL Right 01/17/2016   Procedure: INTRAMEDULLARY (IM) NAIL FEMORAL;  Surgeon: Earnestine Leys, MD;  Location: [facility] ORS;  Service: Orthopedics;  Laterality: Right;  . HERNIA REPAIR    . PERIPHERAL VASCULAR CATHETERIZATION N/A 11/20/2014   Procedure: Glori Luis Cath Insertion;  Surgeon: Algernon Huxley, MD;  Location: [place] CV LAB;  Service: Cardiovascular;  Laterality: N/A;    Prior to Admission medications   Medication Sig Start Date End Date Taking? Authorizing Provider  albuterol (PROVENTIL HFA;VENTOLIN HFA) 108 (90 Base) MCG/ACT inhaler Inhale 1-2 puffs into the lungs every 6 (six) hours as needed.  08/28/15 08/27/16 Yes Historical Provider, MD  digoxin (LANOXIN) 0.125 MG tablet Take 0.125 mg by mouth daily.  02/02/15  Yes Historical Provider, MD  diltiazem (CARDIZEM CD) 120 MG 24 hr capsule Take 120 mg by mouth daily.  11/09/15  Yes Historical Provider, MD  enalapril (VASOTEC) 20 MG tablet Take 20 mg by mouth daily.    Yes  Historical Provider, MD  finasteride (PROSCAR) 5 MG tablet Take 5 mg by mouth daily.   Yes Historical Provider, MD  furosemide (LASIX) 20 MG tablet Take 20 mg by mouth. 09/05/14  Yes Historical Provider, MD  gabapentin (NEURONTIN) 300 MG capsule Take 300 mg by mouth 2 (two) times daily.  07/07/15  Yes Historical Provider, MD  metoprolol (LOPRESSOR) 50 MG tablet Take 50 mg by mouth 2 (two) times daily.  07/30/15 07/29/16 Yes Historical Provider, MD  omeprazole (PRILOSEC) 20 MG capsule Take 1 capsule (20 mg total) by mouth daily. 12/03/15  Yes Lloyd Huger, MD  tamsulosin (FLOMAX) 0.4 MG CAPS capsule Take 0.4 mg by mouth daily. 08/25/15  Yes Historical Provider, MD  chlorpheniramine-HYDROcodone (TUSSIONEX) 10-8 MG/5ML SUER TAKE 5ML BY MOUTH EVERY TWELVE HOURS AS NEEDED FOR COUGH 08/13/15   Historical Provider, MD  fluticasone (FLONASE) 50 MCG/ACT nasal spray ONE SPRAY IN EACH NOSTRIL AT NIGHT AS DIRECTED 07/01/15   Historical Provider, MD  fluticasone (FLOVENT HFA) 220 MCG/ACT inhaler Inhale into the lungs. 08/28/15 08/27/16  Historical Provider, MD  HYDROcodone-acetaminophen (NORCO/VICODIN) 5-325 MG tablet Take 1 tablet by mouth every 6 (six) hours as needed for moderate pain. 01/19/16   Demetrios Loll, MD  predniSONE (DELTASONE) 20 MG tablet Take 1 tablet (20 mg total) by mouth daily. 01/08/16   Norval Gable, MD  prochlorperazine (COMPAZINE) 10 MG tablet Take 1 tablet (10 mg total) by mouth every 6 (six) hours as needed for nausea or vomiting.  11/13/15   Lloyd Huger, MD  senna (SENOKOT) 8.6 MG TABS tablet Take 1 tablet (8.6 mg total) by mouth 2 (two) times daily. 01/19/16   Demetrios Loll, MD  warfarin (COUMADIN) 4 MG tablet Take 2 tablets by mouth with evening meal 07/27/15   Historical Provider, MD    Allergies as of 02/24/2016  . (No Known Allergies)    Family History  Problem Relation Age of Onset  . Cancer Mother   . Stroke Father   . Hypertension Sister   . Hypertension Brother     Social  History   Social History  . Marital status: Married    Spouse name: N/A  . Number of children: N/A  . Years of education: N/A   Occupational History  . Not on file.   Social History Main Topics  . Smoking status: Current Some Day Smoker    Years: 62.00  . Smokeless tobacco: Current User    Types: Snuff  . Alcohol use No  . Drug use: No  . Sexual activity: Not on file   Other Topics Concern  . Not on file   Social History Narrative  . No narrative on file    Review of Systems: See HPI, otherwise negative ROS  Physical Exam: BP (!) 77/55   Pulse 62   Temp 97 F (36.1 C)   Resp 13   Ht 5\' 7"  (1.702 m)   Wt 94.8 kg (209 lb)   SpO2 98%   BMI 32.73 kg/m  General:   Alert,  pleasant and cooperative in NAD Head:  Normocephalic and atraumatic. Neck:  Supple; no masses or thyromegaly. Lungs:  Clear throughout to auscultation.    Heart:  Regular rate and rhythm. Abdomen:  Soft, nontender and nondistended. Normal bowel sounds, without guarding, and without rebound.   Neurologic:  Alert and  oriented x4;  grossly normal neurologically.  Impression/Plan: Jeremy Gordon is here for an endoscopy to be performed for follow up esophageal cancer  Risks, benefits, limitations, and alternatives regarding  endoscopy have been reviewed with the patient.  Questions have been answered.  All parties agreeable.   Gaylyn Cheers, MD  02/26/2016, 10:02 AM

## 2016-02-26 NOTE — Anesthesia Preprocedure Evaluation (Addendum)
Anesthesia Evaluation  Patient identified by MRN, date of birth, ID band Patient awake    Reviewed: Allergy & Precautions, NPO status , Patient's Chart, lab work & pertinent test results  History of Anesthesia Complications Negative for: history of anesthetic complications  Airway Mallampati: II  TM Distance: >3 FB Neck ROM: Full    Dental  (+) Edentulous Upper, Edentulous Lower   Pulmonary sleep apnea and Continuous Positive Airway Pressure Ventilation , COPD,  COPD inhaler, Current Smoker,    breath sounds clear to auscultation- rhonchi (-) wheezing      Cardiovascular hypertension, (-) CAD, (-) Past MI and (-) Cardiac Stents + dysrhythmias Atrial Fibrillation  Rhythm:Irregular Rate:Normal - Systolic murmurs and - Diastolic murmurs Echo [id number]: - Left ventricle: The cavity size was moderately dilated. Systolic   function was moderately to severely reduced. The estimated   ejection fraction was 35%. Diffuse hypokinesis. The study is not   technically sufficient to allow evaluation of LV diastolic   function. - Aortic valve: There was trivial regurgitation. - Mitral valve: Calcified annulus. There was moderate   regurgitation.    Neuro/Psych negative psych ROS   GI/Hepatic Neg liver ROS, GERD  ,  Endo/Other  negative endocrine ROSneg diabetes  Renal/GU negative Renal ROS     Musculoskeletal  (+) Arthritis ,   Abdominal (+) + obese,   Peds  Hematology negative hematology ROS (+)   Anesthesia Other Findings Past Medical History: No date: Arthritis No date: Atrial fibrillation ([facility]) No date: COPD (chronic obstructive pulmonary disease) (* No date: Dysrhythmia No date: Esophageal carcinoma ([place])     Comment: a. Chemo: FOLFIRI No date: Essential hypertension No date: GERD (gastroesophageal reflux disease) No date: History of stress test     Comment: a. 06/2003 MV: EF 56%, no ischemia/infarct. No date:  Persistent atrial fibrillation ([place])     Comment: a. 07/2013 Echo: EF 60-65%, mod dil LA, mild               TR;  CHA2DS2VASc = 3-->chronic coumadin. No date: Sleep apnea   Reproductive/Obstetrics                            Anesthesia Physical Anesthesia Plan  ASA: III  Anesthesia Plan: General   Post-op Pain Management:    Induction: Intravenous  Airway Management Planned: Natural Airway  Additional Equipment:   Intra-op Plan:   Post-operative Plan:   Informed Consent: I have reviewed the patients History and Physical, chart, labs and discussed the procedure including the risks, benefits and alternatives for the proposed anesthesia with the patient or authorized representative who has indicated his/her understanding and acceptance.   Dental advisory given  Plan Discussed with: CRNA and Anesthesiologist  Anesthesia Plan Comments:         Anesthesia Quick Evaluation

## 2016-02-26 NOTE — Anesthesia Postprocedure Evaluation (Signed)
Anesthesia Post Note  Patient: Jeremy Gordon  Procedure(s) Performed: Procedure(s) (LRB): ESOPHAGOGASTRODUODENOSCOPY (EGD) WITH PROPOFOL (N/A)  Patient location during evaluation: PACU Anesthesia Type: General Level of consciousness: awake and alert Pain management: pain level controlled Vital Signs Assessment: post-procedure vital signs reviewed and stable Respiratory status: spontaneous breathing, nonlabored ventilation and respiratory function stable Cardiovascular status: blood pressure returned to baseline and stable Postop Assessment: no signs of nausea or vomiting Anesthetic complications: no    Last Vitals:  Vitals:   02/26/16 0950 02/26/16 1010  BP: (!) 77/55 114/61  Pulse:    Resp:    Temp:      Last Pain:  Vitals:   02/26/16 0940  TempSrc: Tympanic                 Caroline Longie

## 2016-02-26 NOTE — Op Note (Signed)
[facility] Gastroenterology Patient Name: Jeremy Gordon Procedure Date: 02/26/2016 9:08 AM MRN: [id number] Account #: [id number] Date of Birth: Aug 13, 1940 Admit Type: Outpatient Age: 75 Room: [facility] ENDO ROOM 1 Gender: Male Note Status: Finalized Procedure:            Upper GI endoscopy Indications:          Surveillance for malignancy due to personal history of                        esophageal cancer Providers:            Manya Silvas, MD Referring MD:         Carney Bern (Referring MD) Medicines:            Propofol per Anesthesia Complications:        No immediate complications. Procedure:            Pre-Anesthesia Assessment:                       - After reviewing the risks and benefits, the patient                        was deemed in satisfactory condition to undergo the                        procedure.                       After obtaining informed consent, the endoscope was                        passed under direct vision. Throughout the procedure,                        the patient's blood pressure, pulse, and oxygen                        saturations were monitored continuously. The Endoscope                        was introduced through the mouth, and advanced to the                        second part of duodenum. The upper GI endoscopy was                        accomplished without difficulty. The patient tolerated                        the procedure well. Findings:      Diffuse severe mucosal changes characterized by congestion,       discoloration, erosion, friability (with contact bleeding), granularity,       inflammation and ulceration were found in the lower third of the       esophagus. Consistent with esophageal cancer with treatment. The lumen       was open enough to pass the scope into the stomach without dilatation.       Biopsies were taken with a cold forceps for histology. The segment runs       for 5-6cm in distal  esophagus, GEJ at 40 cm.  Retroflexed view showed       ulceration of distal esophagus.      The stomach was normal.      The examined duodenum was normal. Impression:           - Congested, discolored, eroded, friable (with contact                        bleeding), granular, inflamed, ulcerated mucosa in the                        esophagus. Biopsied.                       - Normal stomach.                       - Normal examined duodenum. Recommendation:       - Await pathology results. Manya Silvas, MD 02/26/2016 9:41:12 AM This report has been signed electronically. Number of Addenda: 0 Note Initiated On: 02/26/2016 9:08 AM      [facility]

## 2016-02-26 NOTE — Transfer of Care (Signed)
Immediate Anesthesia Transfer of Care Note  Patient: Jeremy Gordon  Procedure(s) Performed: Procedure(s): ESOPHAGOGASTRODUODENOSCOPY (EGD) WITH PROPOFOL (N/A)  Patient Location: PACU and Endoscopy Unit  Anesthesia Type:General  Level of Consciousness: sedated  Airway & Oxygen Therapy: Patient Spontanous Breathing and Patient connected to nasal cannula oxygen  Post-op Assessment: Report given to RN and Post -op Vital signs reviewed and stable  Post vital signs: stable  Last Vitals:  Vitals:   02/26/16 0853  BP: (!) 91/50  Pulse: 70  Resp: 18  Temp: 36.2 C    Last Pain:  Vitals:   02/26/16 0853  TempSrc: Tympanic         Complications: No apparent anesthesia complications

## 2016-02-29 ENCOUNTER — Encounter: Payer: Self-pay | Admitting: Unknown Physician Specialty

## 2016-03-08 ENCOUNTER — Telehealth: Payer: Self-pay | Admitting: *Deleted

## 2016-03-08 NOTE — Telephone Encounter (Signed)
Appt for tomorrow for IVF and for Monday called to [facility] who said she will inform him to come to [facility] for these appts

## 2016-03-08 NOTE — Telephone Encounter (Signed)
Called to inquire if Dr [employer] office has contacted yet, as they were told that they would call Dr Grayland Ormond regarding Jeremy Gordon's inability to keep anything down. She feels he is dehydrated and is asking for him to be seen. Per Dr Grayland Ormond, he has not heard from them and he said to have him come in for IVF tomorrow and he can see him next week.

## 2016-03-09 ENCOUNTER — Inpatient Hospital Stay: Payer: Medicare Other | Attending: Oncology

## 2016-03-09 ENCOUNTER — Other Ambulatory Visit: Payer: Self-pay | Admitting: Internal Medicine

## 2016-03-09 VITALS — BP 126/84 | HR 105 | Temp 96.3°F | Resp 18

## 2016-03-09 DIAGNOSIS — Z7901 Long term (current) use of anticoagulants: Secondary | ICD-10-CM | POA: Diagnosis not present

## 2016-03-09 DIAGNOSIS — F17210 Nicotine dependence, cigarettes, uncomplicated: Secondary | ICD-10-CM | POA: Diagnosis not present

## 2016-03-09 DIAGNOSIS — I4891 Unspecified atrial fibrillation: Secondary | ICD-10-CM | POA: Diagnosis not present

## 2016-03-09 DIAGNOSIS — G4730 Sleep apnea, unspecified: Secondary | ICD-10-CM | POA: Diagnosis not present

## 2016-03-09 DIAGNOSIS — R12 Heartburn: Secondary | ICD-10-CM | POA: Diagnosis not present

## 2016-03-09 DIAGNOSIS — R1110 Vomiting, unspecified: Secondary | ICD-10-CM | POA: Diagnosis not present

## 2016-03-09 DIAGNOSIS — C155 Malignant neoplasm of lower third of esophagus: Secondary | ICD-10-CM | POA: Diagnosis present

## 2016-03-09 DIAGNOSIS — R1310 Dysphagia, unspecified: Secondary | ICD-10-CM | POA: Insufficient documentation

## 2016-03-09 DIAGNOSIS — I10 Essential (primary) hypertension: Secondary | ICD-10-CM | POA: Diagnosis not present

## 2016-03-09 DIAGNOSIS — Z79899 Other long term (current) drug therapy: Secondary | ICD-10-CM | POA: Insufficient documentation

## 2016-03-09 DIAGNOSIS — M2550 Pain in unspecified joint: Secondary | ICD-10-CM | POA: Diagnosis not present

## 2016-03-09 DIAGNOSIS — C159 Malignant neoplasm of esophagus, unspecified: Secondary | ICD-10-CM

## 2016-03-09 MED ORDER — HEPARIN SOD (PORK) LOCK FLUSH 100 UNIT/ML IV SOLN
500.0000 [IU] | Freq: Once | INTRAVENOUS | Status: AC | PRN
  Administered 2016-03-09: 500 [IU]

## 2016-03-09 MED ORDER — SODIUM CHLORIDE 0.9 % IV SOLN
Freq: Once | INTRAVENOUS | Status: AC
  Administered 2016-03-09: 10:00:00 via INTRAVENOUS
  Filled 2016-03-09: qty 1000

## 2016-03-09 MED ORDER — HEPARIN SOD (PORK) LOCK FLUSH 100 UNIT/ML IV SOLN: 250.0000 [IU] | Freq: Once | INTRAVENOUS | Status: DC | PRN

## 2016-03-14 ENCOUNTER — Inpatient Hospital Stay (HOSPITAL_BASED_OUTPATIENT_CLINIC_OR_DEPARTMENT_OTHER): Payer: Medicare Other | Admitting: Oncology

## 2016-03-14 VITALS — BP 98/67 | HR 62 | Temp 96.7°F | Resp 20

## 2016-03-14 DIAGNOSIS — I10 Essential (primary) hypertension: Secondary | ICD-10-CM

## 2016-03-14 DIAGNOSIS — R1110 Vomiting, unspecified: Secondary | ICD-10-CM

## 2016-03-14 DIAGNOSIS — C155 Malignant neoplasm of lower third of esophagus: Secondary | ICD-10-CM

## 2016-03-14 DIAGNOSIS — I4891 Unspecified atrial fibrillation: Secondary | ICD-10-CM

## 2016-03-14 DIAGNOSIS — R12 Heartburn: Secondary | ICD-10-CM

## 2016-03-14 DIAGNOSIS — M2550 Pain in unspecified joint: Secondary | ICD-10-CM

## 2016-03-14 DIAGNOSIS — R1310 Dysphagia, unspecified: Secondary | ICD-10-CM | POA: Diagnosis not present

## 2016-03-14 DIAGNOSIS — Z7901 Long term (current) use of anticoagulants: Secondary | ICD-10-CM

## 2016-03-14 DIAGNOSIS — F17210 Nicotine dependence, cigarettes, uncomplicated: Secondary | ICD-10-CM

## 2016-03-14 DIAGNOSIS — Z79899 Other long term (current) drug therapy: Secondary | ICD-10-CM

## 2016-03-14 DIAGNOSIS — G4730 Sleep apnea, unspecified: Secondary | ICD-10-CM

## 2016-03-14 NOTE — Progress Notes (Signed)
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  INTERVAL HISTORY: Patient returns to clinic today for further evaluation and discussion of his endoscopy results. Patient states recently he has difficulty swallowing, but denies any dysphasia. Despite this, he continues to have a good appetite and has maintained his weight. He states his reflux is worse.  He has no neurologic complaints. He denies any shortness of breath or chest pain. He denies any nausea, constipation, or diarrhea. He has no melena or hematochezia. He has no urinary complaints. Patient offers no further specific complaints today.  REVIEW OF SYSTEMS:   Review of Systems  Constitutional: Negative for fever, malaise/fatigue and weight loss.  Respiratory: Negative.  Negative for shortness of breath.   Cardiovascular: Negative for chest pain and leg swelling.  Gastrointestinal: Positive for heartburn and vomiting. Negative for abdominal pain, blood in stool, melena and nausea.  Genitourinary: Negative.   Musculoskeletal: Positive for joint pain.  Neurological: Negative.  Negative for weakness.  Endo/Heme/Allergies: Does not bruise/bleed easily.  Psychiatric/Behavioral: Negative.  The patient is not nervous/anxious.     As per HPI. Otherwise, a complete review of systems is negative.   PAST MEDICAL HISTORY:  Hypertension, sleep apnea, atrial fibrillation.  PAST SURGICAL HISTORY: Negative.  FAMILY HISTORY: Reviewed and unchanged. No report of malignancy or chronic disease.     ADVANCED DIRECTIVES:    HEALTH MAINTENANCE: Social History  Substance Use Topics  . Smoking status:  Current Some Day Smoker    Years: 62.00  . Smokeless tobacco: Current User    Types: Snuff  . Alcohol use No     No Known Allergies  Current Outpatient Prescriptions  Medication Sig Dispense Refill  . albuterol (PROVENTIL HFA;VENTOLIN HFA) 108 (90 Base) MCG/ACT inhaler Inhale 1-2 puffs into the lungs every 6 (six) hours as needed.     . digoxin (LANOXIN) 0.125 MG tablet Take 0.125 mg by mouth daily.     Marland Kitchen diltiazem (CARDIZEM CD) 120 MG 24 hr capsule Take 120 mg by mouth daily.     . enalapril (VASOTEC) 20 MG tablet Take 20 mg by mouth daily.     . finasteride (PROSCAR) 5 MG tablet Take 5 mg by mouth daily.    . fluticasone (FLONASE) 50 MCG/ACT nasal spray ONE SPRAY IN EACH NOSTRIL AT NIGHT AS DIRECTED  11  . fluticasone (FLOVENT HFA) 220 MCG/ACT inhaler Inhale into the lungs.    . furosemide (LASIX) 20 MG tablet Take 20 mg by mouth.    . gabapentin (NEURONTIN) 300 MG capsule Take 300 mg by mouth 2 (two) times daily.     Marland Kitchen HYDROcodone-acetaminophen (NORCO/VICODIN) 5-325 MG tablet Take 1 tablet by mouth every 6 (six) hours as needed for moderate pain. 12 tablet 0  . metoprolol (LOPRESSOR) 50 MG tablet Take 50 mg by mouth 2 (two) times daily.     Marland Kitchen omeprazole (PRILOSEC) 20 MG capsule Take 1 capsule (20 mg total) by mouth daily. 30 capsule 2  . prochlorperazine (COMPAZINE) 10 MG tablet Take 1 tablet (10 mg total) by mouth every 6 (six) hours as needed for nausea or vomiting. 90 tablet 5  . senna (SENOKOT) 8.6 MG TABS tablet Take 1 tablet (  8.6 mg total) by mouth 2 (two) times daily. 14 each 0  . tamsulosin (FLOMAX) 0.4 MG CAPS capsule Take 0.4 mg by mouth daily.  3  . warfarin (COUMADIN) 4 MG tablet Take 2 tablets by mouth with evening meal     No current facility-administered medications for this visit.    Facility-Administered Medications Ordered in Other Visits  Medication Dose Route Frequency Provider Last Rate Last Dose  . sodium chloride 0.9 % injection 10 mL  10 mL Intravenous PRN  Lloyd Huger, MD   10 mL at 12/23/14 0945  . sodium chloride 0.9 % injection 10 mL  10 mL Intravenous PRN Lloyd Huger, MD   10 mL at 01/15/15 1512  . sodium chloride flush (NS) 0.9 % injection 10 mL  10 mL Intravenous PRN Lloyd Huger, MD   10 mL at 11/10/15 0939    OBJECTIVE: Vitals:   03/14/16 1154  BP: 98/67  Pulse: 62  Resp: 20  Temp: (!) 96.7 F (35.9 C)     There is no height or weight on file to calculate BMI.    ECOG FS:1 - Symptomatic but completely ambulatory  General: Well-developed, well-nourished, no acute distress. Eyes: anicteric sclera. HEENT: Oropharynx clear without exudate or erythema. No palpable lymphadenopathy. Lungs: Clear to auscultation bilaterally. Heart: Regular rate and rhythm. No rubs, murmurs, or gallops. Abdomen: Soft, nontender, nondistended. No organomegaly noted, normoactive bowel sounds. Musculoskeletal: No edema, cyanosis, or clubbing. Neuro: Alert, answering all questions appropriately. Cranial nerves grossly intact. Skin: No rashes or petechiae noted. Psych: Normal affect.   LAB RESULTS:  Lab Results  Component Value Date   NA 137 02/15/2016   K 3.8 02/15/2016   CL 101 02/15/2016   CO2 25 02/15/2016   GLUCOSE 114 (H) 02/15/2016   BUN 18 02/15/2016   CREATININE 1.00 02/15/2016   CALCIUM 8.8 (L) 02/15/2016   PROT 6.2 (L) 02/15/2016   ALBUMIN 3.4 (L) 02/15/2016   AST 22 02/15/2016   ALT 14 (L) 02/15/2016   ALKPHOS 112 02/15/2016   BILITOT 0.4 02/15/2016   GFRNONAA >60 02/15/2016   GFRAA >60 02/15/2016    Lab Results  Component Value Date   WBC 6.5 02/15/2016   NEUTROABS 4.2 02/15/2016   HGB 12.8 (L) 02/15/2016   HCT 38.3 (L) 02/15/2016   MCV 87.1 02/15/2016   PLT 193 02/15/2016     STUDIES: No results found.  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  PLAN:    1. 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 Endoscopy on February 26, 2016 also revealed residual adenocarcinoma on biopsy. Patient completed 4 cycles of single agent Taxol on December 15, 2015. Patient is not a surgical candidate and no longer can receive XRT. After lengthy discussion with the patient, he has agreed to restart treatment with immunotherapy using Keytruda. Patient will receive treatment every 3 weeks and will reimage after approximately 3 months in March 2018. Return to clinic on March 30, 2016 to initiate cycle 1. 2. Hip fracture: Continued evaluation and treatment per orthopedics. 3. Anemia: Resolved. 4. Reflux: Continue Prilosec as prescribed.  Approximately 30 minutes was spent in discussion of which greater than 50% was consultation.  Patient expressed understanding and was in agreement with this plan. He also understands that He can call clinic at any time with any questions, concerns, or complaints.  Lloyd Huger, MD   03/18/2016 8:27 AM

## 2016-03-18 MED ORDER — PROCHLORPERAZINE MALEATE 10 MG PO TABS
10.0000 mg | ORAL_TABLET | Freq: Four times a day (QID) | ORAL | 1 refills | Status: DC | PRN
Start: 2016-03-18 — End: 2016-04-06

## 2016-03-21 NOTE — Progress Notes (Signed)
Nutrition Assessment   Reason for Assessment:   Patient identified on Malnutrition Screening Report.  ASSESSMENT:  74 year old male with recurrent adenocarcinoma of lower third of esophagus.  Noted per MD note a surgical candidate or eligible to receive further radiation.  Planning to start keytruda on 12/13. Past medical history of HTN, sleep apnea, afib  Spoke with patient via phone.  Patient reports consuming mostly a liquid and/or soft foods diet.  No trouble swallowing for the most part.  Drinking 5 ensures per day per patient.  Reports that he eats soups, soft cooked eggs, grits, applesauce.     Medications: lasix, senna  Labs: glucose 114  Anthropometrics:   Height: 67 inches Weight: 209 lb (11/10) UBW: 220s Noted wt on 01/15/16 visit 239 pounds) BMI: 32  Noted 12% weight loss in the last 2 months, significant   Estimated Energy Needs  Kcals: 2000-2300 kcals/d Protein: 95-114 g/d Fluid: 2.3 L/d  NUTRITION DIAGNOSIS: Inadequate oral intake related to esophageal cancer as evidenced by patient consuming mostly liquid diet and 12% weight loss in the last 2 months.     INTERVENTION:   Discussed easy to chew and swallow foods and ways to increase calories and protein.  Will mail handouts and recipes. Encouraged patient to consume ensure with at least 350 calories per bottle.    MONITORING, EVALUATION, GOAL: Patient will consume adequate calories and protein to prevent further weight loss.   NEXT VISIT: as needed  Jeremy Gordon, [place], [place] (pager)

## 2016-03-25 ENCOUNTER — Encounter: Payer: Self-pay | Admitting: Oncology

## 2016-03-25 LAB — SURGICAL PATHOLOGY

## 2016-03-29 NOTE — Progress Notes (Deleted)
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  INTERVAL HISTORY: Patient returns to clinic today for further evaluation and discussion of his endoscopy results. Patient states recently he has difficulty swallowing, but denies any dysphasia. Despite this, he continues to have a good appetite and has maintained his weight. He states his reflux is worse.  He has no neurologic complaints. He denies any shortness of breath or chest pain. He denies any nausea, constipation, or diarrhea. He has no melena or hematochezia. He has no urinary complaints. Patient offers no further specific complaints today.  REVIEW OF SYSTEMS:   Review of Systems  Constitutional: Negative for fever, malaise/fatigue and weight loss.  Respiratory: Negative.  Negative for shortness of breath.   Cardiovascular: Negative for chest pain and leg swelling.  Gastrointestinal: Positive for heartburn and vomiting. Negative for abdominal pain, blood in stool, melena and nausea.  Genitourinary: Negative.   Musculoskeletal: Positive for joint pain.  Neurological: Negative.  Negative for weakness.  Endo/Heme/Allergies: Does not bruise/bleed easily.  Psychiatric/Behavioral: Negative.  The patient is not nervous/anxious.     As per HPI. Otherwise, a complete review of systems is negative.   PAST MEDICAL HISTORY:  Hypertension, sleep apnea, atrial fibrillation.  PAST SURGICAL HISTORY: Negative.  FAMILY HISTORY: Reviewed and unchanged. No report of malignancy or chronic disease.     ADVANCED DIRECTIVES:    HEALTH MAINTENANCE: Social History  Substance Use Topics  . Smoking status:  Current Some Day Smoker    Years: 62.00  . Smokeless tobacco: Current User    Types: Snuff  . Alcohol use No     No Known Allergies  Current Outpatient Prescriptions  Medication Sig Dispense Refill  . albuterol (PROVENTIL HFA;VENTOLIN HFA) 108 (90 Base) MCG/ACT inhaler Inhale 1-2 puffs into the lungs every 6 (six) hours as needed.     . digoxin (LANOXIN) 0.125 MG tablet Take 0.125 mg by mouth daily.     Marland Kitchen diltiazem (CARDIZEM CD) 120 MG 24 hr capsule Take 120 mg by mouth daily.     . enalapril (VASOTEC) 20 MG tablet Take 20 mg by mouth daily.     . finasteride (PROSCAR) 5 MG tablet Take 5 mg by mouth daily.    . fluticasone (FLONASE) 50 MCG/ACT nasal spray ONE SPRAY IN EACH NOSTRIL AT NIGHT AS DIRECTED  11  . fluticasone (FLOVENT HFA) 220 MCG/ACT inhaler Inhale into the lungs.    . furosemide (LASIX) 20 MG tablet Take 20 mg by mouth.    . gabapentin (NEURONTIN) 300 MG capsule Take 300 mg by mouth 2 (two) times daily.     Marland Kitchen HYDROcodone-acetaminophen (NORCO/VICODIN) 5-325 MG tablet Take 1 tablet by mouth every 6 (six) hours as needed for moderate pain. 12 tablet 0  . metoprolol (LOPRESSOR) 50 MG tablet Take 50 mg by mouth 2 (two) times daily.     Marland Kitchen omeprazole (PRILOSEC) 20 MG capsule Take 1 capsule (20 mg total) by mouth daily. 30 capsule 2  . prochlorperazine (COMPAZINE) 10 MG tablet Take 1 tablet (10 mg total) by mouth every 6 (six) hours as needed for nausea or vomiting. 90 tablet 5  . prochlorperazine (COMPAZINE) 10 MG tablet Take 1 tablet (10  mg total) by mouth every 6 (six) hours as needed (Nausea or vomiting). 30 tablet 1  . senna (SENOKOT) 8.6 MG TABS tablet Take 1 tablet (8.6 mg total) by mouth 2 (two) times daily. 14 each 0  . tamsulosin (FLOMAX) 0.4 MG CAPS capsule Take 0.4 mg by mouth daily.  3  . warfarin (COUMADIN) 4 MG tablet Take 2 tablets by mouth with evening meal     No current facility-administered medications for this visit.    Facility-Administered Medications  Ordered in Other Visits  Medication Dose Route Frequency Provider Last Rate Last Dose  . sodium chloride 0.9 % injection 10 mL  10 mL Intravenous PRN Lloyd Huger, MD   10 mL at 12/23/14 0945  . sodium chloride 0.9 % injection 10 mL  10 mL Intravenous PRN Lloyd Huger, MD   10 mL at 01/15/15 1512  . sodium chloride flush (NS) 0.9 % injection 10 mL  10 mL Intravenous PRN Lloyd Huger, MD   10 mL at 11/10/15 7062    OBJECTIVE: There were no vitals filed for this visit.   There is no height or weight on file to calculate BMI.    ECOG FS:1 - Symptomatic but completely ambulatory  General: Well-developed, well-nourished, no acute distress. Eyes: anicteric sclera. HEENT: Oropharynx clear without exudate or erythema. No palpable lymphadenopathy. Lungs: Clear to auscultation bilaterally. Heart: Regular rate and rhythm. No rubs, murmurs, or gallops. Abdomen: Soft, nontender, nondistended. No organomegaly noted, normoactive bowel sounds. Musculoskeletal: No edema, cyanosis, or clubbing. Neuro: Alert, answering all questions appropriately. Cranial nerves grossly intact. Skin: No rashes or petechiae noted. Psych: Normal affect.   LAB RESULTS:  Lab Results  Component Value Date   NA 137 02/15/2016   K 3.8 02/15/2016   CL 101 02/15/2016   CO2 25 02/15/2016   GLUCOSE 114 (H) 02/15/2016   BUN 18 02/15/2016   CREATININE 1.00 02/15/2016   CALCIUM 8.8 (L) 02/15/2016   PROT 6.2 (L) 02/15/2016   ALBUMIN 3.4 (L) 02/15/2016   AST 22 02/15/2016   ALT 14 (L) 02/15/2016   ALKPHOS 112 02/15/2016   BILITOT 0.4 02/15/2016   GFRNONAA >60 02/15/2016   GFRAA >60 02/15/2016    Lab Results  Component Value Date   WBC 6.5 02/15/2016   NEUTROABS 4.2 02/15/2016   HGB 12.8 (L) 02/15/2016   HCT 38.3 (L) 02/15/2016   MCV 87.1 02/15/2016   PLT 193 02/15/2016     STUDIES: No results found.  ASSESSMENT: Recurrent adenocarcinoma of the lower third of esophagus, HER-2  negative.  PLAN:    1. 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 Endoscopy on February 26, 2016 also revealed residual adenocarcinoma on biopsy. Patient completed 4 cycles of single agent Taxol on December 15, 2015. Patient is not a surgical candidate and no longer can receive XRT. After lengthy discussion with the patient, he has agreed to restart treatment with immunotherapy using Keytruda. Patient will receive treatment every 3 weeks and will reimage after approximately 3 months in March 2018. Return to clinic on March 30, 2016 to initiate cycle 1. 2. Hip fracture: Continued evaluation and treatment per orthopedics. 3. Anemia: Resolved. 4. Reflux: Continue Prilosec as prescribed.  Approximately 30 minutes was spent in discussion of which greater than 50% was consultation.  Patient expressed understanding and was in agreement with this plan. He also understands that He can call  clinic at any time with any questions, concerns, or complaints.    Lloyd Huger, MD   03/29/2016 10:45 PM

## 2016-03-30 ENCOUNTER — Inpatient Hospital Stay: Payer: Medicare Other | Admitting: Oncology

## 2016-03-30 ENCOUNTER — Inpatient Hospital Stay: Payer: Medicare Other

## 2016-04-04 NOTE — Progress Notes (Signed)
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  INTERVAL HISTORY: Patient returns to clinic today for further evaluation and consideration on cycle 1 of Keytruda. Patient states he has difficulty swallowing, and was placed on a liquid diet of only ensures by his primary care. He states ensure is too expensive and he has been eating what ever he can keep down. He has lost 17 lbs since October. Despite this, he continues to feel hungry and eats whatever he wants. He also admits to falling on his right side last week injuring his right shoulder and right hip. He is able to bear wear and able to walk with only minor pain. He states that he tripped over something and was unable to re-gain balance. He denies loosing consciousness. He is following up with his PCP on Friday for the fall. Additionally, he complains of worsening generalized pain and he is requesting more pain medicine. He has no neurologic complaints. He denies any shortness of breath or chest pain. He denies any nausea, constipation, or diarrhea. He has no melena or hematochezia. He has no urinary complaints. Patient offers no further specific complaints today.  REVIEW OF SYSTEMS:   Review of Systems  Constitutional: Negative for fever, malaise/fatigue and weight loss.  Respiratory: Negative.  Negative for shortness of breath.   Cardiovascular: Negative for chest pain and leg swelling.  Gastrointestinal: Positive for heartburn and vomiting. Negative for abdominal pain, blood in stool, melena and nausea.  Genitourinary: Negative.   Musculoskeletal: Positive for joint pain.  Neurological: Negative.   Negative for weakness.  Endo/Heme/Allergies: Does not bruise/bleed easily.  Psychiatric/Behavioral: Negative.  The patient is not nervous/anxious.     As per HPI. Otherwise, a complete review of systems is negative.   PAST MEDICAL HISTORY:  Hypertension, sleep apnea, atrial fibrillation.  PAST SURGICAL HISTORY: Negative.  FAMILY HISTORY: Reviewed and unchanged. No report of malignancy or chronic disease.     ADVANCED DIRECTIVES:    HEALTH MAINTENANCE: Social History  Substance Use Topics  . Smoking status: Current Some Day Smoker    Years: 62.00  . Smokeless tobacco: Current User    Types: Snuff  . Alcohol use No     No Known Allergies  Current Outpatient Prescriptions  Medication Sig Dispense Refill  . albuterol (PROVENTIL HFA;VENTOLIN HFA) 108 (90 Base) MCG/ACT inhaler Inhale 1-2 puffs into the lungs every 6 (six) hours as needed.     . digoxin (LANOXIN) 0.125 MG tablet Take 0.125 mg by mouth daily.     Marland Kitchen diltiazem (CARDIZEM CD) 120 MG 24 hr capsule Take 120 mg by mouth daily.     . enalapril (VASOTEC) 20 MG tablet Take 20 mg by mouth daily.     . finasteride (PROSCAR) 5 MG tablet Take 5 mg by mouth daily.    . fluticasone (FLONASE) 50 MCG/ACT nasal spray ONE SPRAY IN EACH NOSTRIL AT NIGHT AS DIRECTED  11  . fluticasone (FLOVENT HFA) 220 MCG/ACT inhaler Inhale into the lungs.    . furosemide (LASIX) 20 MG tablet Take 20 mg by mouth.    . gabapentin (NEURONTIN) 300 MG capsule Take 300 mg by mouth 2 (two) times daily.     Marland Kitchen  metoprolol (LOPRESSOR) 50 MG tablet Take 50 mg by mouth 2 (two) times daily.     Marland Kitchen omeprazole (PRILOSEC) 20 MG capsule Take 1 capsule (20 mg total) by mouth daily. 30 capsule 2  . prochlorperazine (COMPAZINE) 10 MG tablet Take 1 tablet (10 mg total) by mouth every 6 (six) hours as needed for nausea or vomiting. 90 tablet 5  . senna (SENOKOT) 8.6 MG TABS tablet Take 1 tablet (8.6 mg total) by mouth 2 (two) times daily. 14 each 0  . tamsulosin (FLOMAX)  0.4 MG CAPS capsule Take 0.4 mg by mouth daily.  3  . warfarin (COUMADIN) 4 MG tablet Take 2 tablets by mouth with evening meal    . Morphine Sulfate (MORPHINE CONCENTRATE) 10 mg / 0.5 ml concentrated solution Take 0.5 mLs (10 mg total) by mouth every 6 (six) hours as needed for severe pain. 60 mL 0   No current facility-administered medications for this visit.    Facility-Administered Medications Ordered in Other Visits  Medication Dose Route Frequency Provider Last Rate Last Dose  . sodium chloride 0.9 % injection 10 mL  10 mL Intravenous PRN Lloyd Huger, MD   10 mL at 12/23/14 0945  . sodium chloride 0.9 % injection 10 mL  10 mL Intravenous PRN Lloyd Huger, MD   10 mL at 01/15/15 1512  . sodium chloride flush (NS) 0.9 % injection 10 mL  10 mL Intravenous PRN Lloyd Huger, MD   10 mL at 11/10/15 0939    OBJECTIVE: Vitals:   04/06/16 0919  BP: 112/78  Pulse: 71  Resp: 18  Temp: (!) 96.3 F (35.7 C)     Body mass index is 31.11 kg/m.    ECOG FS:1 - Symptomatic but completely ambulatory  General: Well-developed, well-nourished, no acute distress. Eyes: anicteric sclera. HEENT: Oropharynx clear without exudate or erythema. No palpable lymphadenopathy. Lungs: Clear to auscultation bilaterally. Heart: Regular rate and rhythm. No rubs, murmurs, or gallops. Abdomen: Soft, nontender, nondistended. No organomegaly noted, normoactive bowel sounds. Musculoskeletal: No edema, cyanosis, or clubbing. Neuro: Alert, answering all questions appropriately. Cranial nerves grossly intact. Skin: No rashes or petechiae noted. Psych: Normal affect.   LAB RESULTS:  Lab Results  Component Value Date   NA 138 04/06/2016   K 3.3 (L) 04/06/2016   CL 105 04/06/2016   CO2 28 04/06/2016   GLUCOSE 106 (H) 04/06/2016   BUN 19 04/06/2016   CREATININE 0.79 04/06/2016   CALCIUM 8.8 (L) 04/06/2016   PROT 6.1 (L) 04/06/2016   ALBUMIN 3.4 (L) 04/06/2016   AST 21 04/06/2016   ALT 15  (L) 04/06/2016   ALKPHOS 92 04/06/2016   BILITOT 1.1 04/06/2016   GFRNONAA >60 04/06/2016   GFRAA >60 04/06/2016    Lab Results  Component Value Date   WBC 6.7 04/06/2016   NEUTROABS 4.5 04/06/2016   HGB 12.8 (L) 04/06/2016   HCT 37.4 (L) 04/06/2016   MCV 84.6 04/06/2016   PLT 162 04/06/2016     STUDIES: No results found.  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  PLAN:    1. 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 Endoscopy on February 26, 2016 also revealed residual adenocarcinoma on biopsy. Patient completed 4 cycles of single agent Taxol on December 15, 2015. Patient is not a surgical candidate and no longer can receive XRT. After lengthy discussion with the patient, he has  agreed to restart treatment with immunotherapy using Keytruda. Patient will receive treatment every 3 weeks and will reimage after approximately 3 months in March 2018. Will initiate cycle 1 of Keytruda today. Return in three week for labs and consideration of cycle 2 of Keytruda. 2. Hip fracture: Continued evaluation and treatment per orthopedics. Follow-up with PCP on Friday after falling last week. 3. Anemia: Resolved. 4. Reflux: Continue Prilosec as prescribed. Continue with soft and liquid foods per PCP. Monitor.  5: Pain: Perscribed 38m/0.5ml Roxanol q 6 hours for pain. Educated on how to take liquid pain medication. .  Patient expressed understanding and was in agreement with this plan. He also understands that He can call clinic at any time with any questions, concerns, or complaints.   JFaythe Casa NP 04/06/16   Patient was seen and evaluated independently and I agree with the assessment and plan as dictated above.  TLloyd Huger MD 04/09/16 2:31 PM

## 2016-04-06 ENCOUNTER — Inpatient Hospital Stay: Payer: Medicare Other | Attending: Oncology

## 2016-04-06 ENCOUNTER — Telehealth: Payer: Self-pay | Admitting: *Deleted

## 2016-04-06 ENCOUNTER — Inpatient Hospital Stay (HOSPITAL_BASED_OUTPATIENT_CLINIC_OR_DEPARTMENT_OTHER): Payer: Medicare Other | Admitting: Oncology

## 2016-04-06 ENCOUNTER — Inpatient Hospital Stay: Payer: Medicare Other

## 2016-04-06 VITALS — BP 112/78 | HR 71 | Temp 96.3°F | Resp 18 | Wt 198.6 lb

## 2016-04-06 DIAGNOSIS — S72001S Fracture of unspecified part of neck of right femur, sequela: Secondary | ICD-10-CM

## 2016-04-06 DIAGNOSIS — I4891 Unspecified atrial fibrillation: Secondary | ICD-10-CM | POA: Diagnosis not present

## 2016-04-06 DIAGNOSIS — F17210 Nicotine dependence, cigarettes, uncomplicated: Secondary | ICD-10-CM | POA: Diagnosis not present

## 2016-04-06 DIAGNOSIS — R634 Abnormal weight loss: Secondary | ICD-10-CM | POA: Insufficient documentation

## 2016-04-06 DIAGNOSIS — I10 Essential (primary) hypertension: Secondary | ICD-10-CM | POA: Insufficient documentation

## 2016-04-06 DIAGNOSIS — R12 Heartburn: Secondary | ICD-10-CM | POA: Insufficient documentation

## 2016-04-06 DIAGNOSIS — K219 Gastro-esophageal reflux disease without esophagitis: Secondary | ICD-10-CM

## 2016-04-06 DIAGNOSIS — C155 Malignant neoplasm of lower third of esophagus: Secondary | ICD-10-CM | POA: Insufficient documentation

## 2016-04-06 DIAGNOSIS — Z79899 Other long term (current) drug therapy: Secondary | ICD-10-CM | POA: Insufficient documentation

## 2016-04-06 DIAGNOSIS — M2550 Pain in unspecified joint: Secondary | ICD-10-CM

## 2016-04-06 DIAGNOSIS — G4730 Sleep apnea, unspecified: Secondary | ICD-10-CM | POA: Diagnosis not present

## 2016-04-06 DIAGNOSIS — Z7901 Long term (current) use of anticoagulants: Secondary | ICD-10-CM

## 2016-04-06 DIAGNOSIS — R1110 Vomiting, unspecified: Secondary | ICD-10-CM | POA: Diagnosis not present

## 2016-04-06 DIAGNOSIS — Z5112 Encounter for antineoplastic immunotherapy: Secondary | ICD-10-CM | POA: Insufficient documentation

## 2016-04-06 LAB — COMPREHENSIVE METABOLIC PANEL
ALK PHOS: 92 U/L (ref 38–126)
ALT: 15 U/L — ABNORMAL LOW (ref 17–63)
ANION GAP: 5 (ref 5–15)
AST: 21 U/L (ref 15–41)
Albumin: 3.4 g/dL — ABNORMAL LOW (ref 3.5–5.0)
BUN: 19 mg/dL (ref 6–20)
CALCIUM: 8.8 mg/dL — AB (ref 8.9–10.3)
CO2: 28 mmol/L (ref 22–32)
Chloride: 105 mmol/L (ref 101–111)
Creatinine, Ser: 0.79 mg/dL (ref 0.61–1.24)
Glucose, Bld: 106 mg/dL — ABNORMAL HIGH (ref 65–99)
Potassium: 3.3 mmol/L — ABNORMAL LOW (ref 3.5–5.1)
SODIUM: 138 mmol/L (ref 135–145)
Total Bilirubin: 1.1 mg/dL (ref 0.3–1.2)
Total Protein: 6.1 g/dL — ABNORMAL LOW (ref 6.5–8.1)

## 2016-04-06 LAB — CBC WITH DIFFERENTIAL/PLATELET
Basophils Absolute: 0 10*3/uL (ref 0–0.1)
Basophils Relative: 1 %
EOS ABS: 0.1 10*3/uL (ref 0–0.7)
EOS PCT: 2 %
HCT: 37.4 % — ABNORMAL LOW (ref 40.0–52.0)
Hemoglobin: 12.8 g/dL — ABNORMAL LOW (ref 13.0–18.0)
LYMPHS ABS: 1.5 10*3/uL (ref 1.0–3.6)
Lymphocytes Relative: 22 %
MCH: 29 pg (ref 26.0–34.0)
MCHC: 34.3 g/dL (ref 32.0–36.0)
MCV: 84.6 fL (ref 80.0–100.0)
MONOS PCT: 8 %
Monocytes Absolute: 0.5 10*3/uL (ref 0.2–1.0)
Neutro Abs: 4.5 10*3/uL (ref 1.4–6.5)
Neutrophils Relative %: 67 %
PLATELETS: 162 10*3/uL (ref 150–440)
RBC: 4.42 MIL/uL (ref 4.40–5.90)
RDW: 16 % — ABNORMAL HIGH (ref 11.5–14.5)
WBC: 6.7 10*3/uL (ref 3.8–10.6)

## 2016-04-06 MED ORDER — SODIUM CHLORIDE 0.9 % IV SOLN
200.0000 mg | Freq: Once | INTRAVENOUS | Status: AC
  Administered 2016-04-06: 200 mg via INTRAVENOUS
  Filled 2016-04-06: qty 8

## 2016-04-06 MED ORDER — HEPARIN SOD (PORK) LOCK FLUSH 100 UNIT/ML IV SOLN
500.0000 [IU] | Freq: Once | INTRAVENOUS | Status: AC
  Administered 2016-04-06: 500 [IU] via INTRAVENOUS
  Filled 2016-04-06: qty 5

## 2016-04-06 MED ORDER — MORPHINE SULFATE (CONCENTRATE) 10 MG /0.5 ML PO SOLN: 10.0000 mg | Freq: Four times a day (QID) | ORAL | 0 refills | Status: DC | PRN

## 2016-04-06 MED ORDER — SODIUM CHLORIDE 0.9% FLUSH
10.0000 mL | Freq: Once | INTRAVENOUS | Status: AC
  Administered 2016-04-06: 10 mL via INTRAVENOUS
  Filled 2016-04-06: qty 10

## 2016-04-06 MED ORDER — SODIUM CHLORIDE 0.9 % IV SOLN
Freq: Once | INTRAVENOUS | Status: AC
  Administered 2016-04-06: 10:00:00 via INTRAVENOUS
  Filled 2016-04-06: qty 1000

## 2016-04-06 MED ORDER — MORPHINE SULFATE (CONCENTRATE) 20 MG/ML PO SOLN: 10.0000 mg | Freq: Four times a day (QID) | ORAL | 0 refills | Status: DC | PRN

## 2016-04-06 NOTE — Progress Notes (Signed)
Complains of generalized pain all over. Had recent fall few days ago and hurt right hip.

## 2016-04-06 NOTE — Telephone Encounter (Signed)
Called patient to inform him of new prescription for pain medication. Pick up at the [facility].

## 2016-04-06 NOTE — Telephone Encounter (Signed)
Morphine sulfate 10mg  per ml is on back order pharmacy requests new order for 20mg /ML.

## 2016-04-06 NOTE — Telephone Encounter (Signed)
Prescription is ready for pick up at the front desk

## 2016-04-07 LAB — THYROID PANEL WITH TSH
FREE THYROXINE INDEX: 2 (ref 1.2–4.9)
T3 UPTAKE RATIO: 30 % (ref 24–39)
T4 TOTAL: 6.6 ug/dL (ref 4.5–12.0)
TSH: 1.28 u[IU]/mL (ref 0.450–4.500)

## 2016-04-07 NOTE — Telephone Encounter (Signed)
Patient picked up the new rx for Morphine 20mg /ml and did bring in the rx of Morphine 10mg /0.47ml to be disposed.

## 2016-04-12 ENCOUNTER — Telehealth: Payer: Self-pay | Admitting: *Deleted

## 2016-04-12 NOTE — Telephone Encounter (Signed)
Called to request we call [place] back regarding the fact that everything he eats comes back up, not having difficulty swallowing. Please acll him at [phone]

## 2016-04-12 NOTE — Telephone Encounter (Signed)
He and I talked about this.  I've been saying XRT in not an option, but lets send him to [facility] for evaluation. Maybe there is something he can do.

## 2016-04-12 NOTE — Telephone Encounter (Signed)
Friday 12/29 @1 :30 with Dr Baruch Gouty. I called patient and gave him this appt date and he repeated this back to me twice and agreed to come in

## 2016-04-15 ENCOUNTER — Other Ambulatory Visit: Payer: Self-pay | Admitting: *Deleted

## 2016-04-15 ENCOUNTER — Ambulatory Visit
Admission: RE | Admit: 2016-04-15 | Discharge: 2016-04-15 | Disposition: A | Discharge status: Home / Self Care | Payer: Medicare Other | Source: Ambulatory Visit | Attending: Radiation Oncology | Admitting: Radiation Oncology

## 2016-04-15 ENCOUNTER — Inpatient Hospital Stay: Payer: Medicare Other

## 2016-04-15 ENCOUNTER — Encounter: Payer: Self-pay | Admitting: Radiation Oncology

## 2016-04-15 VITALS — BP 85/54 | HR 106 | Temp 96.1°F | Resp 20

## 2016-04-15 DIAGNOSIS — C155 Malignant neoplasm of lower third of esophagus: Secondary | ICD-10-CM | POA: Diagnosis not present

## 2016-04-15 MED ORDER — HEPARIN SOD (PORK) LOCK FLUSH 100 UNIT/ML IV SOLN
500.0000 [IU] | Freq: Once | INTRAVENOUS | Status: AC
  Administered 2016-04-15: 500 [IU] via INTRAVENOUS
  Filled 2016-04-15: qty 5

## 2016-04-15 MED ORDER — SODIUM CHLORIDE 0.9 % IV SOLN
Freq: Once | INTRAVENOUS | Status: AC
  Administered 2016-04-15: 14:00:00 via INTRAVENOUS
  Filled 2016-04-15: qty 1000

## 2016-04-15 NOTE — Progress Notes (Signed)
Radiation Oncology Follow up Note  Name: Jeremy Gordon   Date:   04/15/2016 MRN:  [id number] DOB: 08-04-1940    This 75 y.o. male presents to the clinic today for recurrent distal esophageal adenocarcinoma.  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 He received upfront chemoradiation. With his radiation dose 540 cGy over 5 weeks. 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 Patient is also had a repeat PET CT scan showing hypermetabolic activity in the distal esophagus compatible with known recurrent disease. 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 I been asked to evaluate the patient for possible salvage radiation therapy  COMPLICATIONS OF TREATMENT: none  FOLLOW UP COMPLIANCE: keeps appointments   PHYSICAL EXAM:  BP (!) 85/54   Pulse (!) 106   Temp (!) 96.1 F (35.6 C)   Resp 53  Well-developed elderly male in NAD. No cervical or supra clavicular adenopathy is appreciated Well-developed well-nourished patient in NAD. HEENT reveals PERLA, EOMI, discs not visualized.  Oral cavity is clear. No oral mucosal lesions are identified. Neck is clear without evidence of cervical or supraclavicular adenopathy. Lungs are clear to A&P. Cardiac examination is essentially unremarkable with regular rate and rhythm without murmur rub or thrill. Abdomen is benign with no organomegaly or masses noted. Motor sensory and DTR levels are equal and symmetric in the upper and lower extremities. Cranial nerves II through XII are grossly intact. Proprioception is  intact. No peripheral adenopathy or edema is identified. No motor or sensory levels are noted. Crude visual fields are within normal range.  RADIOLOGY RESULTS: PET CT scan is reviewed compatible above-stated findings  PLAN: At the present time seems to be the patient's only known disease. He is narcotic dependent with significant dysphagia. Certainly retreating esophageal cancer is not commonplace but after 2 years I believe I can safely deliver 3060 cGy in 17 fractions at 180 cGy per fraction using I am RT to contact strictly feels much is possible and spare normal tissue such as lung heart spinal cord. Risks and benefits of treatment including increased dysphasia skin reaction fatigue alteration of blood counts and possible fistula formation all were discussed in detail with the patient. I will try to coordinate some concurrent chemotherapy options with medical oncology. I personally set up and scheduled CT simulation for next week.  I would like to take this opportunity to thank you for allowing me to participate in the care of your patient.Armstead Peaks., MD

## 2016-04-21 ENCOUNTER — Inpatient Hospital Stay: Payer: Medicare Other | Attending: Oncology

## 2016-04-21 DIAGNOSIS — R634 Abnormal weight loss: Secondary | ICD-10-CM | POA: Insufficient documentation

## 2016-04-21 DIAGNOSIS — Z5111 Encounter for antineoplastic chemotherapy: Secondary | ICD-10-CM | POA: Insufficient documentation

## 2016-04-21 DIAGNOSIS — Z79899 Other long term (current) drug therapy: Secondary | ICD-10-CM | POA: Insufficient documentation

## 2016-04-21 DIAGNOSIS — R112 Nausea with vomiting, unspecified: Secondary | ICD-10-CM | POA: Insufficient documentation

## 2016-04-21 DIAGNOSIS — Z8781 Personal history of (healed) traumatic fracture: Secondary | ICD-10-CM | POA: Insufficient documentation

## 2016-04-21 DIAGNOSIS — C155 Malignant neoplasm of lower third of esophagus: Secondary | ICD-10-CM | POA: Insufficient documentation

## 2016-04-21 DIAGNOSIS — F17210 Nicotine dependence, cigarettes, uncomplicated: Secondary | ICD-10-CM | POA: Insufficient documentation

## 2016-04-21 DIAGNOSIS — I4891 Unspecified atrial fibrillation: Secondary | ICD-10-CM | POA: Insufficient documentation

## 2016-04-21 DIAGNOSIS — I10 Essential (primary) hypertension: Secondary | ICD-10-CM | POA: Insufficient documentation

## 2016-04-21 DIAGNOSIS — R1310 Dysphagia, unspecified: Secondary | ICD-10-CM | POA: Insufficient documentation

## 2016-04-21 DIAGNOSIS — Z7901 Long term (current) use of anticoagulants: Secondary | ICD-10-CM | POA: Insufficient documentation

## 2016-04-21 DIAGNOSIS — K219 Gastro-esophageal reflux disease without esophagitis: Secondary | ICD-10-CM | POA: Insufficient documentation

## 2016-04-21 DIAGNOSIS — G4730 Sleep apnea, unspecified: Secondary | ICD-10-CM | POA: Insufficient documentation

## 2016-04-21 NOTE — Progress Notes (Signed)
Nutrition   Called patient this afternoon after unable to make it to nutrition appointment at 10am this morning.Spoke with patient's grand-daughter Maggie Font who lives with him.  She reports that roads were snowy and patient unable to drive to the clinic.  Pt has been rescheduled for 1/8 at 11:15am.  Grand-daughter reports that patient was able to eat cheerios and milk this am for breakfast without issues.  Yesterday was able to drink 2 ensure original (220 calories). Grand-daughter reports patient frequently vomits shortly (about 10 minutes) after eating even water.  Hard to gather information from grand-daughter during phone call.   Encouraged grand-daughter to buy ensure/boost product with 350 calories or more to offer patient more calories.  Will further discuss nutrition strategies at appointment on Monday. Discussed phone conversation with MD Grayland Ormond.   Patient will be coming to clinic tomorrow but RD not available at this site on Fridays.   Kathrine Rieves B. Zenia Resides, [place], [place] (pager)

## 2016-04-21 NOTE — Progress Notes (Signed)
Nutrition  Patient was no show/no call for nutrition appointment today.  Eliyah Mcshea B. Zenia Resides, [place], [place] (pager)

## 2016-04-22 ENCOUNTER — Ambulatory Visit
Admission: RE | Admit: 2016-04-22 | Discharge: 2016-04-22 | Disposition: A | Discharge status: Home / Self Care | Payer: Medicare Other | Source: Ambulatory Visit | Attending: Radiation Oncology | Admitting: Radiation Oncology

## 2016-04-22 ENCOUNTER — Inpatient Hospital Stay: Payer: Medicare Other

## 2016-04-22 VITALS — BP 112/63 | HR 75 | Temp 95.7°F | Resp 18

## 2016-04-22 DIAGNOSIS — Z5111 Encounter for antineoplastic chemotherapy: Secondary | ICD-10-CM | POA: Diagnosis not present

## 2016-04-22 DIAGNOSIS — K219 Gastro-esophageal reflux disease without esophagitis: Secondary | ICD-10-CM | POA: Diagnosis not present

## 2016-04-22 DIAGNOSIS — E860 Dehydration: Secondary | ICD-10-CM

## 2016-04-22 DIAGNOSIS — Z7901 Long term (current) use of anticoagulants: Secondary | ICD-10-CM | POA: Diagnosis not present

## 2016-04-22 DIAGNOSIS — R1310 Dysphagia, unspecified: Secondary | ICD-10-CM | POA: Diagnosis not present

## 2016-04-22 DIAGNOSIS — F17210 Nicotine dependence, cigarettes, uncomplicated: Secondary | ICD-10-CM | POA: Diagnosis not present

## 2016-04-22 DIAGNOSIS — C155 Malignant neoplasm of lower third of esophagus: Secondary | ICD-10-CM | POA: Diagnosis present

## 2016-04-22 DIAGNOSIS — I10 Essential (primary) hypertension: Secondary | ICD-10-CM | POA: Diagnosis not present

## 2016-04-22 DIAGNOSIS — R112 Nausea with vomiting, unspecified: Secondary | ICD-10-CM | POA: Diagnosis not present

## 2016-04-22 DIAGNOSIS — I4891 Unspecified atrial fibrillation: Secondary | ICD-10-CM | POA: Diagnosis not present

## 2016-04-22 DIAGNOSIS — Z8781 Personal history of (healed) traumatic fracture: Secondary | ICD-10-CM | POA: Diagnosis not present

## 2016-04-22 DIAGNOSIS — R634 Abnormal weight loss: Secondary | ICD-10-CM | POA: Diagnosis not present

## 2016-04-22 DIAGNOSIS — G4730 Sleep apnea, unspecified: Secondary | ICD-10-CM | POA: Diagnosis not present

## 2016-04-22 DIAGNOSIS — Z79899 Other long term (current) drug therapy: Secondary | ICD-10-CM | POA: Diagnosis not present

## 2016-04-22 MED ORDER — SODIUM CHLORIDE 0.9 % IV SOLN
Freq: Once | INTRAVENOUS | Status: AC
  Administered 2016-04-22: 11:00:00 via INTRAVENOUS
  Filled 2016-04-22: qty 1000

## 2016-04-22 MED ORDER — HEPARIN SOD (PORK) LOCK FLUSH 100 UNIT/ML IV SOLN
500.0000 [IU] | Freq: Once | INTRAVENOUS | Status: AC
  Administered 2016-04-22: 500 [IU] via INTRAVENOUS
  Filled 2016-04-22: qty 5

## 2016-04-22 NOTE — Progress Notes (Signed)
Pt here for IV today, reports that he has been eating and drinking okay, no loss of appetite. Pt requested/eating 1 pack of crackers, I bag of cookies and soda since arrival, no nausea noted. Pt with granddaughter at chairside.

## 2016-04-25 ENCOUNTER — Inpatient Hospital Stay: Payer: Medicare Other

## 2016-04-25 DIAGNOSIS — Z9181 History of falling: Secondary | ICD-10-CM

## 2016-04-25 DIAGNOSIS — R1310 Dysphagia, unspecified: Secondary | ICD-10-CM

## 2016-04-25 DIAGNOSIS — R634 Abnormal weight loss: Secondary | ICD-10-CM

## 2016-04-25 DIAGNOSIS — Z7901 Long term (current) use of anticoagulants: Secondary | ICD-10-CM | POA: Insufficient documentation

## 2016-04-25 DIAGNOSIS — I4891 Unspecified atrial fibrillation: Secondary | ICD-10-CM

## 2016-04-25 DIAGNOSIS — G4730 Sleep apnea, unspecified: Secondary | ICD-10-CM | POA: Insufficient documentation

## 2016-04-25 DIAGNOSIS — F17210 Nicotine dependence, cigarettes, uncomplicated: Secondary | ICD-10-CM

## 2016-04-25 DIAGNOSIS — Z7951 Long term (current) use of inhaled steroids: Secondary | ICD-10-CM

## 2016-04-25 DIAGNOSIS — Z79899 Other long term (current) drug therapy: Secondary | ICD-10-CM | POA: Insufficient documentation

## 2016-04-25 DIAGNOSIS — C155 Malignant neoplasm of lower third of esophagus: Secondary | ICD-10-CM | POA: Insufficient documentation

## 2016-04-25 DIAGNOSIS — I10 Essential (primary) hypertension: Secondary | ICD-10-CM | POA: Insufficient documentation

## 2016-04-25 DIAGNOSIS — K219 Gastro-esophageal reflux disease without esophagitis: Secondary | ICD-10-CM

## 2016-04-25 NOTE — Progress Notes (Unsigned)
PSN spoke with patient via phone today to ask about additional needs.  Patient reported that having the Lucianne Lei bring him to and from his cancer center appointments was helpful, along with help with groceries and assistance paying for natural gas to heat his home.  No other needs identified at this time.

## 2016-04-25 NOTE — Progress Notes (Signed)
Nutrition:  Patient was a no show for nutrition appointment today.  Noted on 1/5 came in for IV and ate pack of crackers, bag of cookies and drank soda without difficulty.  Noted patient will receive [facility]ne Lei assistance for next appointments.  Will attempt to follow-up at clinic appointments or via phone.  Jeremy Gordon, [place], [place] (pager)

## 2016-04-25 NOTE — Progress Notes (Signed)
[place]  Telephone:(336) [phone] Fax:(336) [phone]  ID: Jeremy Gordon OB: 06-12-1940  MR#: [id number]  DUK#:[id number]  Patient Care Team: Jeremy Ohms, MD as PCP - General (Geriatric Medicine) No Pcp Per Patient (General Practice) Jeremy Silvas, MD (Gastroenterology)  CHIEF COMPLAINT:  Recurrent adenocarcinoma of the lower third of esophagus, HER-2 negative.  INTERVAL HISTORY: Patient returns to clinic today for further evaluation and consideration on cycle 2 of Keytruda. Patient continues to have difficulty swallowing and continues to lose weight. He continues to have occasional falls. He has no neurologic complaints. He denies any shortness of breath or chest pain. He denies any nausea, constipation, or diarrhea. Is vomiting every time he eats solid foods, but admits soft foods and liquids are better tolerated.  He has no melena or hematochezia. He has no urinary complaints. Patient offers no further specific complaints today.  REVIEW OF SYSTEMS:   Review of Systems  Constitutional: Negative for fever, malaise/fatigue and weight loss.  Respiratory: Negative.  Negative for shortness of breath.   Cardiovascular: Negative for chest pain and leg swelling.  Gastrointestinal: Positive for heartburn and vomiting. Negative for abdominal pain, blood in stool, melena and nausea.  Genitourinary: Negative.   Musculoskeletal: Positive for joint pain.  Neurological: Negative.  Negative for weakness.  Endo/Heme/Allergies: Does not bruise/bleed easily.  Psychiatric/Behavioral: Negative.  The patient is not nervous/anxious.    As per HPI. Otherwise, a complete review of systems is negative.   PAST MEDICAL HISTORY:  Hypertension, sleep apnea, atrial fibrillation.  PAST SURGICAL HISTORY: Negative.  FAMILY HISTORY: Reviewed and unchanged. No report of malignancy or chronic disease.     ADVANCED DIRECTIVES:    HEALTH MAINTENANCE: Social History  Substance Use  Topics  . Smoking status: Current Some Day Smoker    Years: 62.00  . Smokeless tobacco: Current User    Types: Snuff  . Alcohol use No     No Known Allergies  Current Outpatient Prescriptions  Medication Sig Dispense Refill  . albuterol (PROVENTIL HFA;VENTOLIN HFA) 108 (90 Base) MCG/ACT inhaler Inhale 1-2 puffs into the lungs every 6 (six) hours as needed.     Marland Kitchen CARAFATE 1 GM/10ML suspension Take 10 mL (1 g total) by mouth Two (2) times a day.  0  . digoxin (LANOXIN) 0.125 MG tablet Take 0.125 mg by mouth daily.     Marland Kitchen diltiazem (CARDIZEM CD) 120 MG 24 hr capsule Take 120 mg by mouth daily.     . enalapril (VASOTEC) 20 MG tablet Take 20 mg by mouth daily.     . finasteride (PROSCAR) 5 MG tablet Take 5 mg by mouth daily.    . fluticasone (FLONASE) 50 MCG/ACT nasal spray ONE SPRAY IN EACH NOSTRIL AT NIGHT AS DIRECTED  11  . fluticasone (FLOVENT HFA) 220 MCG/ACT inhaler Inhale into the lungs.    . furosemide (LASIX) 20 MG tablet Take 20 mg by mouth.    . gabapentin (NEURONTIN) 300 MG capsule Take 300 mg by mouth 2 (two) times daily.     Marland Kitchen HYDROcodone-acetaminophen (NORCO) 10-325 MG tablet Take 1 tablet by mouth every 8 (eight) hours as needed. for pain  0  . metoprolol (LOPRESSOR) 50 MG tablet Take 50 mg by mouth 2 (two) times daily.     Marland Kitchen morphine (ROXANOL) 20 MG/ML concentrated solution Take 0.5 mLs (10 mg total) by mouth every 6 (six) hours as needed for severe pain. 60 mL 0  . omeprazole (PRILOSEC) 20 MG capsule Take  1 capsule (20 mg total) by mouth daily. 30 capsule 2  . prochlorperazine (COMPAZINE) 10 MG tablet Take 1 tablet (10 mg total) by mouth every 6 (six) hours as needed for nausea or vomiting. 90 tablet 5  . senna (SENOKOT) 8.6 MG TABS tablet Take 1 tablet (8.6 mg total) by mouth 2 (two) times daily. 14 each 0  . tamsulosin (FLOMAX) 0.4 MG CAPS capsule Take 0.4 mg by mouth daily.  3  . warfarin (COUMADIN) 4 MG tablet Take 2 tablets by mouth with evening meal     No current  facility-administered medications for this visit.    Facility-Administered Medications Ordered in Other Visits  Medication Dose Route Frequency Provider Last Rate Last Dose  . sodium chloride 0.9 % injection 10 mL  10 mL Intravenous PRN Jeremy Huger, MD   10 mL at 12/23/14 0945  . sodium chloride 0.9 % injection 10 mL  10 mL Intravenous PRN Jeremy Huger, MD   10 mL at 01/15/15 1512  . sodium chloride flush (NS) 0.9 % injection 10 mL  10 mL Intravenous PRN Jeremy Huger, MD   10 mL at 11/10/15 0939    OBJECTIVE: Vitals:   04/27/16 0928  BP: 98/60  Pulse: (!) 46  Resp: 18  Temp: 97.8 F (36.6 C)     Body mass index is 30.94 kg/m.    ECOG FS:1 - Symptomatic but completely ambulatory  General: Well-developed, well-nourished, no acute distress. Eyes: anicteric sclera. HEENT: Oropharynx clear without exudate or erythema. No palpable lymphadenopathy. Lungs: Clear to auscultation bilaterally. Heart: Regular rate and rhythm. No rubs, murmurs, or gallops. Abdomen: Soft, nontender, nondistended. No organomegaly noted, normoactive bowel sounds. Musculoskeletal: No edema, cyanosis, or clubbing. Neuro: Alert, answering all questions appropriately. Cranial nerves grossly intact. Skin: No rashes or petechiae noted. Psych: Normal affect.   LAB RESULTS:  Lab Results  Component Value Date   NA 136 04/27/2016   K 3.1 (L) 04/27/2016   CL 103 04/27/2016   CO2 25 04/27/2016   GLUCOSE 158 (H) 04/27/2016   BUN 16 04/27/2016   CREATININE 1.01 04/27/2016   CALCIUM 8.6 (L) 04/27/2016   PROT 5.8 (L) 04/27/2016   ALBUMIN 3.0 (L) 04/27/2016   AST 29 04/27/2016   ALT 10 (L) 04/27/2016   ALKPHOS 73 04/27/2016   BILITOT 0.9 04/27/2016   GFRNONAA >60 04/27/2016   GFRAA >60 04/27/2016    Lab Results  Component Value Date   WBC 5.7 04/27/2016   NEUTROABS 3.8 04/27/2016   HGB 12.9 (L) 04/27/2016   HCT 37.5 (L) 04/27/2016   MCV 83.9 04/27/2016   PLT 207 04/27/2016      STUDIES: No results found.  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  PLAN:    1. 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 Endoscopy on February 26, 2016 also revealed residual adenocarcinoma on biopsy. Patient completed 4 cycles of single agent Taxol on December 15, 2015. Patient is not a surgical candidate. Patient was referred to radiation oncology and although unconventional, plan to give him additional XRT starting next week. Because of this, will hold Keytruda and proceed with weekly concurrent carboplatinum and Taxol. Return in 1 week for consideration of cycle 1 of carboplatinum and Taxol. 2. Hip fracture: Continued evaluation and treatment per orthopedics. Follow-up with PCP on Friday after falling last week. 3. Anemia: Resolved. 4. Reflux: Continue Prilosec as prescribed.  Continue with soft and liquid foods per PCP. Monitor.  5: Pain: Perscribed 23m/0.5ml Roxanol q 6 hours for pain. Educated on how to take liquid pain medication. .  Patient expressed understanding and was in agreement with this plan. He also understands that He can call clinic at any time with any questions, concerns, or complaints.    TLloyd Huger MD 04/29/16 9:06 AM

## 2016-04-26 DIAGNOSIS — C155 Malignant neoplasm of lower third of esophagus: Secondary | ICD-10-CM | POA: Diagnosis not present

## 2016-04-27 ENCOUNTER — Inpatient Hospital Stay (HOSPITAL_BASED_OUTPATIENT_CLINIC_OR_DEPARTMENT_OTHER): Payer: Medicare Other | Admitting: Oncology

## 2016-04-27 ENCOUNTER — Inpatient Hospital Stay: Payer: Medicare Other

## 2016-04-27 VITALS — BP 98/60 | HR 46 | Temp 97.8°F | Resp 18 | Wt 197.5 lb

## 2016-04-27 DIAGNOSIS — R634 Abnormal weight loss: Secondary | ICD-10-CM | POA: Diagnosis not present

## 2016-04-27 DIAGNOSIS — G4730 Sleep apnea, unspecified: Secondary | ICD-10-CM

## 2016-04-27 DIAGNOSIS — Z79899 Other long term (current) drug therapy: Secondary | ICD-10-CM

## 2016-04-27 DIAGNOSIS — R1310 Dysphagia, unspecified: Secondary | ICD-10-CM | POA: Diagnosis not present

## 2016-04-27 DIAGNOSIS — C155 Malignant neoplasm of lower third of esophagus: Secondary | ICD-10-CM

## 2016-04-27 DIAGNOSIS — F17210 Nicotine dependence, cigarettes, uncomplicated: Secondary | ICD-10-CM

## 2016-04-27 DIAGNOSIS — I4891 Unspecified atrial fibrillation: Secondary | ICD-10-CM

## 2016-04-27 DIAGNOSIS — K219 Gastro-esophageal reflux disease without esophagitis: Secondary | ICD-10-CM

## 2016-04-27 DIAGNOSIS — Z7901 Long term (current) use of anticoagulants: Secondary | ICD-10-CM

## 2016-04-27 DIAGNOSIS — I10 Essential (primary) hypertension: Secondary | ICD-10-CM

## 2016-04-27 DIAGNOSIS — Z7951 Long term (current) use of inhaled steroids: Secondary | ICD-10-CM

## 2016-04-27 DIAGNOSIS — Z9181 History of falling: Secondary | ICD-10-CM

## 2016-04-27 LAB — CBC WITH DIFFERENTIAL/PLATELET
BASOS PCT: 1 %
Basophils Absolute: 0.1 10*3/uL (ref 0–0.1)
EOS ABS: 0.1 10*3/uL (ref 0–0.7)
EOS PCT: 2 %
HCT: 37.5 % — ABNORMAL LOW (ref 40.0–52.0)
Hemoglobin: 12.9 g/dL — ABNORMAL LOW (ref 13.0–18.0)
LYMPHS PCT: 22 %
Lymphs Abs: 1.2 10*3/uL (ref 1.0–3.6)
MCH: 28.9 pg (ref 26.0–34.0)
MCHC: 34.5 g/dL (ref 32.0–36.0)
MCV: 83.9 fL (ref 80.0–100.0)
MONO ABS: 0.5 10*3/uL (ref 0.2–1.0)
Monocytes Relative: 9 %
Neutro Abs: 3.8 10*3/uL (ref 1.4–6.5)
Neutrophils Relative %: 66 %
PLATELETS: 207 10*3/uL (ref 150–440)
RBC: 4.47 MIL/uL (ref 4.40–5.90)
RDW: 16.8 % — AB (ref 11.5–14.5)
WBC: 5.7 10*3/uL (ref 3.8–10.6)

## 2016-04-27 LAB — COMPREHENSIVE METABOLIC PANEL
ALT: 10 U/L — ABNORMAL LOW (ref 17–63)
AST: 29 U/L (ref 15–41)
Albumin: 3 g/dL — ABNORMAL LOW (ref 3.5–5.0)
Alkaline Phosphatase: 73 U/L (ref 38–126)
Anion gap: 8 (ref 5–15)
BUN: 16 mg/dL (ref 6–20)
CHLORIDE: 103 mmol/L (ref 101–111)
CO2: 25 mmol/L (ref 22–32)
Calcium: 8.6 mg/dL — ABNORMAL LOW (ref 8.9–10.3)
Creatinine, Ser: 1.01 mg/dL (ref 0.61–1.24)
GFR calc Af Amer: >60 mL/min (ref >60)
Glucose, Bld: 158 mg/dL — ABNORMAL HIGH (ref 65–99)
POTASSIUM: 3.1 mmol/L — AB (ref 3.5–5.1)
SODIUM: 136 mmol/L (ref 135–145)
Total Bilirubin: 0.9 mg/dL (ref 0.3–1.2)
Total Protein: 5.8 g/dL — ABNORMAL LOW (ref 6.5–8.1)

## 2016-04-27 MED ORDER — SODIUM CHLORIDE 0.9% FLUSH
10.0000 mL | INTRAVENOUS | Status: DC | PRN
  Administered 2016-04-27: 10 mL via INTRAVENOUS
  Filled 2016-04-27: qty 10

## 2016-04-27 MED ORDER — HEPARIN SOD (PORK) LOCK FLUSH 100 UNIT/ML IV SOLN
500.0000 [IU] | Freq: Once | INTRAVENOUS | Status: AC
  Administered 2016-04-27: 500 [IU] via INTRAVENOUS
  Filled 2016-04-27: qty 5

## 2016-04-27 NOTE — Progress Notes (Signed)
Patient is here for follow up, he is doing well   Checked patient bp on left arm it was 100/61 pulse was 66  Rechecked on right arm it was 98/60 with pulse of 46

## 2016-04-28 LAB — THYROID PANEL WITH TSH
FREE THYROXINE INDEX: 2.3 (ref 1.2–4.9)
T3 UPTAKE RATIO: 31 % (ref 24–39)
T4, Total: 7.5 ug/dL (ref 4.5–12.0)
TSH: 1.36 u[IU]/mL (ref 0.450–4.500)

## 2016-04-29 NOTE — Progress Notes (Signed)
START OFF PATHWAY REGIMEN - Gastroesophageal  Off Pathway: Carboplatin + Paclitaxel (2/50) + RT weekly x 6 weeks  [id number]:Carboplatin + Paclitaxel (2/50) + RT weekly x 6 weeks:   Administer weekly during RT:     Paclitaxel (Taxol(R)) 50 mg/m2 in 250 mL NS IV over 60 minutes once weekly x 6 weeks Dose Mod: None     Carboplatin (Paraplatin(R)) AUC=2 in 100 mL D5W IV over 30 minutes once weekly x 6 weeks Dose Mod: None Additional Orders: Given with concurrent chemoradiotherapy. GCSF therapy with RT is a relative contraindication.  **Always confirm dose/schedule in your pharmacy ordering system**    Patient Characteristics: [place], Adenocarcinoma, Stage IV, Metastatic / Locally Recurrent Disease, Third Line and Beyond, MSS / pMMR Histology: Adenocarcinoma Disease Classification: [place] AJCC T Stage: X AJCC M Stage: X AJCC N Stage: X AJCC Stage Grouping: Unknown Current Disease Status: Local Recurrence Tumor Grade: X Line of therapy: Third Line and Beyond Would you be surprised if this patient died  in the next year? I would NOT be surprised if this patient died in the next year Microsatellite/Mismatch Repair Status: MSS/pMMR  Intent of Therapy: Non-Curative / Palliative Intent, Discussed with Patient

## 2016-05-02 NOTE — Progress Notes (Deleted)
[place]  Telephone:(336) [phone] Fax:(336) [phone]  ID: Jeremy Gordon OB: 12-25-1940  MR#: [id number]  TUU#:[id number]  Patient Care Team: [name], MD as PCP - General (Geriatric Medicine) No Pcp Per Patient (General Practice) Manya Silvas, MD (Gastroenterology)  CHIEF COMPLAINT:  Recurrent adenocarcinoma of the lower third of esophagus, HER-2 negative.  INTERVAL HISTORY: Patient returns to clinic today for further evaluation and consideration on cycle 2 of Keytruda. Patient continues to have difficulty swallowing and continues to lose weight. He continues to have occasional falls. He has no neurologic complaints. He denies any shortness of breath or chest pain. He denies any nausea, constipation, or diarrhea. Is vomiting every time he eats solid foods, but admits soft foods and liquids are better tolerated.  He has no melena or hematochezia. He has no urinary complaints. Patient offers no further specific complaints today.  REVIEW OF SYSTEMS:   Review of Systems  Constitutional: Negative for fever, malaise/fatigue and weight loss.  Respiratory: Negative.  Negative for shortness of breath.   Cardiovascular: Negative for chest pain and leg swelling.  Gastrointestinal: Positive for heartburn and vomiting. Negative for abdominal pain, blood in stool, melena and nausea.  Genitourinary: Negative.   Musculoskeletal: Positive for joint pain.  Neurological: Negative.  Negative for weakness.  Endo/Heme/Allergies: Does not bruise/bleed easily.  Psychiatric/Behavioral: Negative.  The patient is not nervous/anxious.    As per HPI. Otherwise, a complete review of systems is negative.   PAST MEDICAL HISTORY:  Hypertension, sleep apnea, atrial fibrillation.  PAST SURGICAL HISTORY: Negative.  FAMILY HISTORY: Reviewed and unchanged. No report of malignancy or chronic disease.     ADVANCED DIRECTIVES:    HEALTH MAINTENANCE: Social History  Substance Use  Topics  . Smoking status: Current Some Day Smoker    Years: 62.00  . Smokeless tobacco: Current User    Types: Snuff  . Alcohol use No     No Known Allergies  Current Outpatient Prescriptions  Medication Sig Dispense Refill  . albuterol (PROVENTIL HFA;VENTOLIN HFA) 108 (90 Base) MCG/ACT inhaler Inhale 1-2 puffs into the lungs every 6 (six) hours as needed.     Marland Kitchen CARAFATE 1 GM/10ML suspension Take 10 mL (1 g total) by mouth Two (2) times a day.  0  . digoxin (LANOXIN) 0.125 MG tablet Take 0.125 mg by mouth daily.     Marland Kitchen diltiazem (CARDIZEM CD) 120 MG 24 hr capsule Take 120 mg by mouth daily.     . enalapril (VASOTEC) 20 MG tablet Take 20 mg by mouth daily.     . finasteride (PROSCAR) 5 MG tablet Take 5 mg by mouth daily.    . fluticasone (FLONASE) 50 MCG/ACT nasal spray ONE SPRAY IN EACH NOSTRIL AT NIGHT AS DIRECTED  11  . fluticasone (FLOVENT HFA) 220 MCG/ACT inhaler Inhale into the lungs.    . furosemide (LASIX) 20 MG tablet Take 20 mg by mouth.    . gabapentin (NEURONTIN) 300 MG capsule Take 300 mg by mouth 2 (two) times daily.     Marland Kitchen HYDROcodone-acetaminophen (NORCO) 10-325 MG tablet Take 1 tablet by mouth every 8 (eight) hours as needed. for pain  0  . metoprolol (LOPRESSOR) 50 MG tablet Take 50 mg by mouth 2 (two) times daily.     Marland Kitchen morphine (ROXANOL) 20 MG/ML concentrated solution Take 0.5 mLs (10 mg total) by mouth every 6 (six) hours as needed for severe pain. 60 mL 0  . omeprazole (PRILOSEC) 20 MG capsule Take  1 capsule (20 mg total) by mouth daily. 30 capsule 2  . prochlorperazine (COMPAZINE) 10 MG tablet Take 1 tablet (10 mg total) by mouth every 6 (six) hours as needed for nausea or vomiting. 90 tablet 5  . senna (SENOKOT) 8.6 MG TABS tablet Take 1 tablet (8.6 mg total) by mouth 2 (two) times daily. 14 each 0  . tamsulosin (FLOMAX) 0.4 MG CAPS capsule Take 0.4 mg by mouth daily.  3  . warfarin (COUMADIN) 4 MG tablet Take 2 tablets by mouth with evening meal     No current  facility-administered medications for this visit.    Facility-Administered Medications Ordered in Other Visits  Medication Dose Route Frequency Provider Last Rate Last Dose  . sodium chloride 0.9 % injection 10 mL  10 mL Intravenous PRN Lloyd Huger, MD   10 mL at 12/23/14 0945  . sodium chloride 0.9 % injection 10 mL  10 mL Intravenous PRN Lloyd Huger, MD   10 mL at 01/15/15 1512  . sodium chloride flush (NS) 0.9 % injection 10 mL  10 mL Intravenous PRN Lloyd Huger, MD   10 mL at 11/10/15 9381    OBJECTIVE: There were no vitals filed for this visit.   There is no height or weight on file to calculate BMI.    ECOG FS:1 - Symptomatic but completely ambulatory  General: Well-developed, well-nourished, no acute distress. Eyes: anicteric sclera. HEENT: Oropharynx clear without exudate or erythema. No palpable lymphadenopathy. Lungs: Clear to auscultation bilaterally. Heart: Regular rate and rhythm. No rubs, murmurs, or gallops. Abdomen: Soft, nontender, nondistended. No organomegaly noted, normoactive bowel sounds. Musculoskeletal: No edema, cyanosis, or clubbing. Neuro: Alert, answering all questions appropriately. Cranial nerves grossly intact. Skin: No rashes or petechiae noted. Psych: Normal affect.   LAB RESULTS:  Lab Results  Component Value Date   NA 136 04/27/2016   K 3.1 (L) 04/27/2016   CL 103 04/27/2016   CO2 25 04/27/2016   GLUCOSE 158 (H) 04/27/2016   BUN 16 04/27/2016   CREATININE 1.01 04/27/2016   CALCIUM 8.6 (L) 04/27/2016   PROT 5.8 (L) 04/27/2016   ALBUMIN 3.0 (L) 04/27/2016   AST 29 04/27/2016   ALT 10 (L) 04/27/2016   ALKPHOS 73 04/27/2016   BILITOT 0.9 04/27/2016   GFRNONAA >60 04/27/2016   GFRAA >60 04/27/2016    Lab Results  Component Value Date   WBC 5.7 04/27/2016   NEUTROABS 3.8 04/27/2016   HGB 12.9 (L) 04/27/2016   HCT 37.5 (L) 04/27/2016   MCV 83.9 04/27/2016   PLT 207 04/27/2016     STUDIES: No results  found.  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  PLAN:    1. 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 Endoscopy on February 26, 2016 also revealed residual adenocarcinoma on biopsy. Patient completed 4 cycles of single agent Taxol on December 15, 2015. Patient is not a surgical candidate. Patient was referred to radiation oncology and although unconventional, plan to give him additional XRT starting next week. Because of this, will hold Keytruda and proceed with weekly concurrent carboplatinum and Taxol. Return in 1 week for consideration of cycle 1 of carboplatinum and Taxol. 2. Hip fracture: Continued evaluation and treatment per orthopedics. Follow-up with PCP on Friday after falling last week. 3. Anemia: Resolved. 4. Reflux: Continue Prilosec as prescribed. Continue with soft and liquid foods per PCP. Monitor.  5: Pain: Perscribed 81m/0.5ml Roxanol q 6 hours for pain. Educated on how to take liquid pain medication. .  Patient expressed understanding and was in agreement with this plan. He also understands that He can call clinic at any time with any questions, concerns, or complaints.    TLloyd Huger MD 05/02/16 9:46 PM

## 2016-05-04 ENCOUNTER — Inpatient Hospital Stay: Payer: Medicare Other

## 2016-05-04 ENCOUNTER — Ambulatory Visit: Payer: Medicare Other

## 2016-05-04 ENCOUNTER — Inpatient Hospital Stay: Payer: Medicare Other | Admitting: Oncology

## 2016-05-05 ENCOUNTER — Inpatient Hospital Stay: Payer: Medicare Other | Admitting: Oncology

## 2016-05-05 ENCOUNTER — Ambulatory Visit: Payer: Medicare Other

## 2016-05-05 ENCOUNTER — Inpatient Hospital Stay: Payer: Medicare Other

## 2016-05-05 ENCOUNTER — Other Ambulatory Visit: Payer: Medicare Other

## 2016-05-06 ENCOUNTER — Ambulatory Visit: Payer: Medicare Other

## 2016-05-06 ENCOUNTER — Emergency Department
Admission: EM | Admit: 2016-05-06 | Discharge: 2016-05-06 | Disposition: A | Discharge status: Home / Self Care | Payer: Medicare Other | Attending: Emergency Medicine | Admitting: Emergency Medicine

## 2016-05-06 ENCOUNTER — Other Ambulatory Visit: Payer: Self-pay

## 2016-05-06 ENCOUNTER — Telehealth: Payer: Self-pay | Admitting: *Deleted

## 2016-05-06 ENCOUNTER — Encounter: Payer: Self-pay | Admitting: Emergency Medicine

## 2016-05-06 ENCOUNTER — Inpatient Hospital Stay: Payer: Medicare Other

## 2016-05-06 DIAGNOSIS — Z79899 Other long term (current) drug therapy: Secondary | ICD-10-CM | POA: Diagnosis not present

## 2016-05-06 DIAGNOSIS — R112 Nausea with vomiting, unspecified: Secondary | ICD-10-CM | POA: Insufficient documentation

## 2016-05-06 DIAGNOSIS — F17290 Nicotine dependence, other tobacco product, uncomplicated: Secondary | ICD-10-CM | POA: Diagnosis not present

## 2016-05-06 DIAGNOSIS — J449 Chronic obstructive pulmonary disease, unspecified: Secondary | ICD-10-CM | POA: Insufficient documentation

## 2016-05-06 DIAGNOSIS — Z7901 Long term (current) use of anticoagulants: Secondary | ICD-10-CM | POA: Insufficient documentation

## 2016-05-06 DIAGNOSIS — I10 Essential (primary) hypertension: Secondary | ICD-10-CM | POA: Insufficient documentation

## 2016-05-06 LAB — COMPREHENSIVE METABOLIC PANEL
ALBUMIN: 3.3 g/dL — AB (ref 3.5–5.0)
ALK PHOS: 79 U/L (ref 38–126)
ALT: 11 U/L — AB (ref 17–63)
ANION GAP: 9 (ref 5–15)
AST: 21 U/L (ref 15–41)
BILIRUBIN TOTAL: 1.3 mg/dL — AB (ref 0.3–1.2)
BUN: 15 mg/dL (ref 6–20)
CALCIUM: 9 mg/dL (ref 8.9–10.3)
CO2: 28 mmol/L (ref 22–32)
CREATININE: 1.08 mg/dL (ref 0.61–1.24)
Chloride: 103 mmol/L (ref 101–111)
GFR calc Af Amer: >60 mL/min (ref >60)
GFR calc non Af Amer: >60 mL/min (ref >60)
Glucose, Bld: 100 mg/dL — ABNORMAL HIGH (ref 65–99)
Potassium: 3.8 mmol/L (ref 3.5–5.1)
Sodium: 140 mmol/L (ref 135–145)
TOTAL PROTEIN: 6.2 g/dL — AB (ref 6.5–8.1)

## 2016-05-06 LAB — CBC
HCT: 40.7 % (ref 40.0–52.0)
Hemoglobin: 13.9 g/dL (ref 13.0–18.0)
MCH: 29.1 pg (ref 26.0–34.0)
MCHC: 34.2 g/dL (ref 32.0–36.0)
MCV: 85.1 fL (ref 80.0–100.0)
PLATELETS: 217 10*3/uL (ref 150–440)
RBC: 4.78 MIL/uL (ref 4.40–5.90)
RDW: 17 % — AB (ref 11.5–14.5)
WBC: 7.9 10*3/uL (ref 3.8–10.6)

## 2016-05-06 LAB — LIPASE, BLOOD: Lipase: 18 U/L (ref 11–51)

## 2016-05-06 LAB — TROPONIN I: TROPONIN I: 0.04 ng/mL — AB (ref <0.03)

## 2016-05-06 MED ORDER — ONDANSETRON HCL 4 MG PO TABS: 4.0000 mg | ORAL_TABLET | Freq: Three times a day (TID) | ORAL | 0 refills | Status: AC | PRN

## 2016-05-06 MED ORDER — METOPROLOL TARTRATE 50 MG PO TABS
50.0000 mg | ORAL_TABLET | Freq: Once | ORAL | Status: AC
  Administered 2016-05-06: 50 mg via ORAL
  Filled 2016-05-06: qty 1

## 2016-05-06 NOTE — Telephone Encounter (Signed)
Asking if he can come in for IVF. Advised to take to ER if he needs IVF today, we cannot accommodate him due to lack of chairs.

## 2016-05-06 NOTE — ED Notes (Signed)
Dr. Reita Cliche returns to bedside to discuss POC with patient.

## 2016-05-06 NOTE — ED Notes (Signed)
MD with order in [facility] for I&O cath. Patient refusing; MD made aware. Dr. Reita Cliche to speak with patient. Family returns to bedside at this time.

## 2016-05-06 NOTE — ED Notes (Signed)
Pt receiving fluids .  Dr. Reita Cliche aware of troponin.  No new orders received.

## 2016-05-06 NOTE — ED Notes (Signed)

## 2016-05-06 NOTE — ED Triage Notes (Signed)
Pt presents with vomiting. States is unable to keep anything down and has lost 10 pounds since last week. Pt with throat cancer.

## 2016-05-06 NOTE — ED Provider Notes (Signed)
[facility] Emergency Department Provider Note ____________________________________________   I have reviewed the triage vital signs and the triage nursing note.  HISTORY  Chief Complaint Emesis   Historian Patient  HPI Jeremy Gordon is a 76 y.o. male who lives at home, alone right now while his wife is in [facility] due to a brain tumor, but he has had cancer for about a year now, and has been dealing with ongoing nausea which causes him to have poor by mouth intake. He's had to go to the cancer center in the ER for nausea and concern for dehydration in the past.  Son and daughter-in-law are here and they're concerned about him living alone although they do state that he could come stay with them but is heard headed.  No chest pain. No fever. No cough or trouble breathing. No abdominal pain. Symptoms of nausea are the same as they've been intermittently now for about a year. He thinks a be okay if he gets a bag of fluids.  They tried to go to the cancer center today, but they were told to go to the ED for further evaluation.    Past Medical History:  Diagnosis Date  . Arthritis   . Atrial fibrillation ([place])   . COPD (chronic obstructive pulmonary disease) ([place])   . Dysrhythmia   . 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 Chemo: FOLFIRI  . Essential hypertension   . GERD (gastroesophageal reflux disease)   . History of stress test    a. 06/2003 MV: EF 56%, no ischemia/infarct.  . Persistent atrial fibrillation ([place])    a. 07/2013 Echo: EF 60-65%, mod dil LA, mild TR;  CHA2DS2VASc = 3-->chronic coumadin.  . Sleep apnea     Patient Active Problem List   Diagnosis Date Noted  . Closed right hip fracture ([place]) 01/15/2016  . Malignant neoplasm of lower third of esophagus ([place]) 09/10/2015  . Pneumonitis due to inhalation of food or vomitus ([place]) 08/24/2015  . Persistent atrial fibrillation ([place]) 01/29/2015  . Ventricular tachycardia ([place]) 01/29/2015  .  Sepsis ([place]) 01/29/2015  . Essential hypertension 01/29/2015  . Hypokalemia 01/29/2015  . Hypomagnesemia 01/29/2015  . Systemic infection ([place]) 01/29/2015  . Atrial fibrillation ([place])   . Aspiration pneumonia of right lower lobe ([place])   . Pneumonia 01/26/2015  . PNA (pneumonia) 01/26/2015  . Chest wall pain 10/08/2014  . Chest pain 10/08/2014  . Dysphagia 02/05/2014  . Apnea, sleep 10/09/2013  . Carpal tunnel syndrome 03/11/2013  . Sciatica 11/23/2012  . Acid reflux 11/23/2012  . Gastro-esophageal reflux disease without esophagitis 11/23/2012  . 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 Enlarged prostate 10/15/2012  . Allergic rhinitis 08/15/2012  . Back ache 08/15/2012  . Atrial fibrillation, chronic ([place]) 08/15/2012  . CAFL (chronic airflow limitation) ([place]) 08/15/2012  . Breathlessness on exertion 08/15/2012  . BP (high blood pressure) 08/15/2012  . Malaise and fatigue 08/15/2012  . 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 Arthritis of knee, degenerative 08/15/2012  . 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 Lumbar canal stenosis 08/15/2012  . Chronic obstructive pulmonary disease ([place]) 08/15/2012  . Dorsalgia 08/15/2012  . Essential (primary) hypertension 08/15/2012  . Breath shortness 08/15/2012  . Does not feel right 08/15/2012  . Post viral syndrome 08/15/2012  . 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 ESOPHAGOGASTRODUODENOSCOPY (EGD) WITH PROPOFOL N/A 08/31/2015   Procedure: ESOPHAGOGASTRODUODENOSCOPY (EGD)  WITH PROPOFOL;  Surgeon: Manya Silvas, MD;  Location: [facility] ENDOSCOPY;  Service: Endoscopy;  Laterality: N/A;  . ESOPHAGOGASTRODUODENOSCOPY (EGD) WITH PROPOFOL N/A 02/26/2016   Procedure: ESOPHAGOGASTRODUODENOSCOPY (EGD) WITH PROPOFOL;  Surgeon: Manya Silvas, MD;  Location: [facility] ENDOSCOPY;  Service: Endoscopy;  Laterality: N/A;  . FEMUR IM NAIL Right 01/17/2016    Procedure: INTRAMEDULLARY (IM) NAIL FEMORAL;  Surgeon: Earnestine Leys, MD;  Location: [facility] ORS;  Service: Orthopedics;  Laterality: Right;  . HERNIA REPAIR    . PERIPHERAL VASCULAR CATHETERIZATION N/A 11/20/2014   Procedure: Glori Luis Cath Insertion;  Surgeon: Algernon Huxley, MD;  Location: [facility] LAB;  Service: Cardiovascular;  Laterality: N/A;    Prior to Admission medications   Medication Sig Start Date End Date Taking? Authorizing Provider  albuterol (PROVENTIL HFA;VENTOLIN HFA) 108 (90 Base) MCG/ACT inhaler Inhale 1-2 puffs into the lungs every 6 (six) hours as needed.  08/28/15 08/27/16  Historical Provider, MD  CARAFATE 1 GM/10ML suspension Take 10 mL (1 g total) by mouth Two (2) times a day. 04/05/16   Historical Provider, MD  digoxin (LANOXIN) 0.125 MG tablet Take 0.125 mg by mouth daily.  02/02/15   Historical Provider, MD  diltiazem (CARDIZEM CD) 120 MG 24 hr capsule Take 120 mg by mouth daily.  11/09/15   Historical Provider, MD  enalapril (VASOTEC) 20 MG tablet Take 20 mg by mouth daily.     Historical Provider, MD  finasteride (PROSCAR) 5 MG tablet Take 5 mg by mouth daily.    Historical Provider, MD  fluticasone (FLONASE) 50 MCG/ACT nasal spray ONE SPRAY IN EACH NOSTRIL AT NIGHT AS DIRECTED 07/01/15   Historical Provider, MD  fluticasone (FLOVENT HFA) 220 MCG/ACT inhaler Inhale into the lungs. 08/28/15 08/27/16  Historical Provider, MD  furosemide (LASIX) 20 MG tablet Take 20 mg by mouth. 09/05/14   Historical Provider, MD  gabapentin (NEURONTIN) 300 MG capsule Take 300 mg by mouth 2 (two) times daily.  07/07/15   Historical Provider, MD  HYDROcodone-acetaminophen (NORCO) 10-325 MG tablet Take 1 tablet by mouth every 8 (eight) hours as needed. for pain 04/25/16   Historical Provider, MD  metoprolol (LOPRESSOR) 50 MG tablet Take 50 mg by mouth 2 (two) times daily.  07/30/15 07/29/16  Historical Provider, MD  morphine (ROXANOL) 20 MG/ML concentrated solution Take 0.5 mLs (10 mg total) by mouth  every 6 (six) hours as needed for severe pain. 04/06/16   Lloyd Huger, MD  omeprazole (PRILOSEC) 20 MG capsule Take 1 capsule (20 mg total) by mouth daily. 12/03/15   Lloyd Huger, MD  ondansetron (ZOFRAN) 4 MG tablet Take 1 tablet (4 mg total) by mouth every 8 (eight) hours as needed for nausea or vomiting. 05/06/16   Lisa Roca, MD  prochlorperazine (COMPAZINE) 10 MG tablet Take 1 tablet (10 mg total) by mouth every 6 (six) hours as needed for nausea or vomiting. 11/13/15   Lloyd Huger, MD  senna (SENOKOT) 8.6 MG TABS tablet Take 1 tablet (8.6 mg total) by mouth 2 (two) times daily. 01/19/16   Demetrios Loll, MD  tamsulosin (FLOMAX) 0.4 MG CAPS capsule Take 0.4 mg by mouth daily. 08/25/15   Historical Provider, MD  warfarin (COUMADIN) 4 MG tablet Take 2 tablets by mouth with evening meal 07/27/15   Historical Provider, MD    No Known Allergies  Family History  Problem Relation Age of Onset  . Cancer Mother   . Stroke Father   . Hypertension Sister   .  Hypertension Brother     Social History Social History  Substance Use Topics  . Smoking status: Current Some Day Smoker    Years: 62.00  . Smokeless tobacco: Current User    Types: Snuff  . Alcohol use No    Review of Systems  Constitutional: Negative for fever. Eyes: Negative for visual changes. ENT: Negative for sore throat. Cardiovascular: Negative for chest pain. Respiratory: Negative for shortness of breath. Gastrointestinal: Negative for diarrhea. Genitourinary: Negative for dysuria. Musculoskeletal: Negative for back pain. Skin: Negative for rash. Neurological: Negative for headache. 10 point Review of Systems otherwise negative ____________________________________________   PHYSICAL EXAM:  VITAL SIGNS: ED Triage Vitals [05/06/16 1426]  Enc Vitals Group     BP 120/61     Pulse Rate 77     Resp 20     Temp 98 F (36.7 C)     Temp Source Oral     SpO2 94 %     Weight 186 lb (84.4 kg)     Height  5\' 6"  (1.676 m)     Head Circumference      Peak Flow      Pain Score      Pain Loc      Pain Edu?      Excl. in [place]?      Constitutional: Alert and oriented. Well appearing and in no distress. HEENT   Head: Normocephalic and atraumatic.      Eyes: Conjunctivae are normal. PERRL. Normal extraocular movements.      Ears:         Nose: No congestion/rhinnorhea.   Mouth/Throat: Mucous membranes are moist.   Neck: No stridor. Cardiovascular/Chest: Normal rate, regular rhythm.  No murmurs, rubs, or gallops. Respiratory: Normal respiratory effort without tachypnea nor retractions. Breath sounds are clear and equal bilaterally. No wheezes/rales/rhonchi. Gastrointestinal: Soft. No distention, no guarding, no rebound. Nontender.    Genitourinary/rectal:Deferred Musculoskeletal: Nontender with normal range of motion in all extremities. No joint effusions.  No lower extremity tenderness.  No edema. Neurologic:  Normal speech and language. No gross or focal neurologic deficits are appreciated. Skin:  Skin is warm, dry and intact. No rash noted. Psychiatric: Mood and affect are normal. Speech and behavior are normal. Patient exhibits appropriate insight and judgment.   ____________________________________________  LABS (pertinent positives/negatives)  Labs Reviewed  COMPREHENSIVE METABOLIC PANEL - Abnormal; Notable for the following:       Result Value   Glucose, Bld 100 (*)    Total Protein 6.2 (*)    Albumin 3.3 (*)    ALT 11 (*)    Total Bilirubin 1.3 (*)    All other components within normal limits  CBC - Abnormal; Notable for the following:    RDW 17.0 (*)    All other components within normal limits  TROPONIN I - Abnormal; Notable for the following:    Troponin I 0.04 (*)    All other components within normal limits  LIPASE, BLOOD  URINALYSIS, COMPLETE (UACMP) WITH MICROSCOPIC    ____________________________________________    EKG I, Lisa Roca, MD, the  attending physician have personally viewed and interpreted all ECGs.  117 bpm. Atrial fibrillation with rapid ventricular response. Right bundle branch block with left anterior fascicular block. Left axis deviation. Nonspecific ST and T-wave ____________________________________________  RADIOLOGY All Xrays were viewed by me. Imaging interpreted by Radiologist.  None __________________________________________  PROCEDURES  Procedure(s) performed: None  Critical Care performed: None  ____________________________________________   ED COURSE / ASSESSMENT  AND PLAN  Pertinent labs & imaging results that were available during my care of the patient were reviewed by me and considered in my medical decision making (see chart for details).   Jeremy Gordon is here for nausea and poor intake, stable vital signs without fever, no tachycardia or hypotension. Laboratory studies are reassuring without any acute renal failure or electrolyte abnormalities. No symptoms of infection. I am adding on troponin and EKG with complaint of nausea, as well as a urinalysis. Although, it certainly seems like this is a chronic and ongoing issue that he is had.  Daughter-in-law was asking about possible placement, but son states that his dad is hard headed and will not leave his house. They are planning to get him to go home with either of the 2 sons.  Follow-up at the cancer center. Does not appear to me that he has a prescription for Zofran, and I will provide this prescription.  EKG obtained, finally, and shows A. fib RVR, but in the room he is anywhere between 90 and 105. Patient is getting a little agitated and wants to go home. His son is going take him home. He did not want for a urine sample and I think this is reasonable if he is not having any specific symptoms. He states "I came here for fluids and they are done."    CONSULTATIONS:   None   Patient / Family / Caregiver informed of clinical course,  medical decision-making process, and agree with plan.   I discussed return precautions, follow-up instructions, and discharge instructions with patient and/or family.   ___________________________________________   FINAL CLINICAL IMPRESSION(S) / ED DIAGNOSES   Final diagnoses:  Non-intractable vomiting with nausea, unspecified vomiting type              Note: This dictation was prepared with Dragon dictation. Any transcriptional errors that result from this process are unintentional    Lisa Roca, MD 05/06/16 1939

## 2016-05-06 NOTE — Discharge Instructions (Signed)
Return to the emergency room for any fever, worsening vomiting with concern for dehydration such as dry mouth and not making urine, abdominal pain, vomiting blood, trouble breathing, chest pain, or any other symptoms concerning to you.

## 2016-05-09 ENCOUNTER — Ambulatory Visit: Admission: RE | Admit: 2016-05-09 | Payer: Medicare Other | Source: Ambulatory Visit

## 2016-05-09 ENCOUNTER — Inpatient Hospital Stay: Payer: Medicare Other

## 2016-05-09 ENCOUNTER — Ambulatory Visit: Payer: Medicare Other

## 2016-05-09 ENCOUNTER — Inpatient Hospital Stay (HOSPITAL_BASED_OUTPATIENT_CLINIC_OR_DEPARTMENT_OTHER): Payer: Medicare Other | Admitting: Oncology

## 2016-05-09 VITALS — BP 122/87 | HR 90 | Temp 96.5°F | Resp 18

## 2016-05-09 DIAGNOSIS — R112 Nausea with vomiting, unspecified: Secondary | ICD-10-CM

## 2016-05-09 DIAGNOSIS — K219 Gastro-esophageal reflux disease without esophagitis: Secondary | ICD-10-CM

## 2016-05-09 DIAGNOSIS — F17210 Nicotine dependence, cigarettes, uncomplicated: Secondary | ICD-10-CM

## 2016-05-09 DIAGNOSIS — I4891 Unspecified atrial fibrillation: Secondary | ICD-10-CM

## 2016-05-09 DIAGNOSIS — Z79899 Other long term (current) drug therapy: Secondary | ICD-10-CM

## 2016-05-09 DIAGNOSIS — R1310 Dysphagia, unspecified: Secondary | ICD-10-CM

## 2016-05-09 DIAGNOSIS — Z8781 Personal history of (healed) traumatic fracture: Secondary | ICD-10-CM

## 2016-05-09 DIAGNOSIS — C155 Malignant neoplasm of lower third of esophagus: Secondary | ICD-10-CM | POA: Diagnosis not present

## 2016-05-09 DIAGNOSIS — G4730 Sleep apnea, unspecified: Secondary | ICD-10-CM

## 2016-05-09 DIAGNOSIS — Z7901 Long term (current) use of anticoagulants: Secondary | ICD-10-CM

## 2016-05-09 DIAGNOSIS — I10 Essential (primary) hypertension: Secondary | ICD-10-CM

## 2016-05-09 MED ORDER — SODIUM CHLORIDE 0.9 % IV SOLN
Freq: Once | INTRAVENOUS | Status: AC
  Administered 2016-05-09: 10:00:00 via INTRAVENOUS
  Filled 2016-05-09: qty 1000

## 2016-05-09 MED ORDER — SODIUM CHLORIDE 0.9 % IV SOLN
10.0000 mg | Freq: Once | INTRAVENOUS | Status: AC
  Administered 2016-05-09: 10 mg via INTRAVENOUS
  Filled 2016-05-09: qty 1

## 2016-05-09 MED ORDER — SODIUM CHLORIDE 0.9 % IV SOLN
Freq: Once | INTRAVENOUS | Status: AC
  Administered 2016-05-09: 10:00:00 via INTRAVENOUS
  Filled 2016-05-09 (×2): qty 4

## 2016-05-09 MED ORDER — PROMETHAZINE HCL 25 MG RE SUPP: 25.0000 mg | Freq: Four times a day (QID) | RECTAL | 1 refills | Status: DC | PRN

## 2016-05-09 MED ORDER — HEPARIN SOD (PORK) LOCK FLUSH 100 UNIT/ML IV SOLN
500.0000 [IU] | Freq: Once | INTRAVENOUS | Status: DC | PRN
  Filled 2016-05-09: qty 5

## 2016-05-09 MED ORDER — SODIUM CHLORIDE 0.9% FLUSH
10.0000 mL | INTRAVENOUS | Status: DC | PRN
  Filled 2016-05-09: qty 10

## 2016-05-09 NOTE — Progress Notes (Signed)
Nutrition Follow-up:  Patient seen during infusion of IV fluids and zofran this am.  Patient was due to start radiation today but unable to due to nausea.  Note recent visit to ED (1/19). 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  Patient is not a surgical candidate per MD Grayland Ormond and planning to start radiation therapy, continuing on keytruda.  Son with patient during visit and reports patient has moved in with son and his wife.  Patient reports wife has brain tumor and recently put in nursing home.    Son reports that for the past few days patient has had trouble keeping even liquids down (ie water).  Was able to keep mashed potatoes with gravy down last night for dinner but unable to keep applesauce down that daughter in law made. Vomiting with gingerale and boost plus sample today during fluids this am.    Patient reports that Dr. Grayland Ormond has discussed a feeding tube several weeks ago but reports he does not want it.  Did say today during infusion regarding feeding tube "if I have to get it I have to."   Medications: carafate, zofran, compazine, senokot  Labs: glucose 100  Anthropometrics:   Height: 67 inches Weight: 186 pounds on 1/19 BMI: 30  11% weight loss in the last 2 months, significant   NUTRITION DIAGNOSIS: Inadequate oral intake continues   MALNUTRITION DIAGNOSIS: Patient meets criteria for severe malnutrition in context of acute illness likely progressing to chronic as evidenced by consuming < or equal to 50% of energy intake for > or equal to 5 days and 11% weight loss in the last 2 months.     INTERVENTION:   Discussed pureeing/grinding/blending foods with patient and son. Handout provided.   Discussed consuming oral nutrition supplements with at least 350 calories or more, adding ice cream, etc to adding more calories and protein.  Son verbalized understanding.  Gave free samples of boost products as currently has not tried them.    Discussed with Dr. Grayland Ormond if aggressive therapy is wanted would recommend feeding tube placement to meet caloric and hydration needs.    MONITORING, EVALUATION, GOAL:  Patient will consume adequate calories and protein to prevent further weight loss.     NEXT VISIT: Jan 25 during infusion  Jeremy Gordon B. Jeremy Gordon, [place], [place] (pager)

## 2016-05-10 ENCOUNTER — Ambulatory Visit: Payer: Medicare Other

## 2016-05-10 ENCOUNTER — Other Ambulatory Visit: Payer: Self-pay | Admitting: *Deleted

## 2016-05-10 NOTE — Telephone Encounter (Signed)
Fill request from pharmacy for Carafate which was not ordered by our office

## 2016-05-10 NOTE — Telephone Encounter (Signed)
I called pharmacy and this was sent to [place] in error

## 2016-05-11 ENCOUNTER — Ambulatory Visit: Payer: Medicare Other

## 2016-05-11 DIAGNOSIS — C155 Malignant neoplasm of lower third of esophagus: Secondary | ICD-10-CM | POA: Diagnosis not present

## 2016-05-11 NOTE — Progress Notes (Signed)
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  INTERVAL HISTORY: Patient returns to clinic today as an add-on to further placement of a feeding tube as well as advance care directives. Patient continues to have difficulty swallowing with nausea and vomiting. He also  continues to lose weight. He continues to have occasional falls. He has no neurologic complaints. He denies any shortness of breath or chest pain. He denies any constipation or diarrhea. He has no melena or hematochezia. He has no urinary complaints. Patient offers no further specific complaints today.  REVIEW OF SYSTEMS:   Review of Systems  Constitutional: Negative for fever, malaise/fatigue and weight loss.  Respiratory: Negative.  Negative for shortness of breath.   Cardiovascular: Negative for chest pain and leg swelling.  Gastrointestinal: Positive for heartburn, nausea and vomiting. Negative for abdominal pain, blood in stool and melena.  Genitourinary: Negative.   Musculoskeletal: Positive for joint pain.  Neurological: Negative.  Negative for weakness.  Endo/Heme/Allergies: Does not bruise/bleed easily.  Psychiatric/Behavioral: Negative.  The patient is not nervous/anxious.    As per HPI. Otherwise, a complete review of systems is negative.   PAST MEDICAL HISTORY:  Hypertension, sleep apnea, atrial fibrillation.  PAST SURGICAL HISTORY: Negative.  FAMILY HISTORY: Reviewed and unchanged. No report of malignancy or chronic disease.     ADVANCED DIRECTIVES:    HEALTH MAINTENANCE: Social History  Substance Use Topics  . Smoking status: Current Some Day Smoker   Years: 62.00  . Smokeless tobacco: Current User    Types: Snuff  . Alcohol use No     No Known Allergies  Current Outpatient Prescriptions  Medication Sig Dispense Refill  . albuterol (PROVENTIL HFA;VENTOLIN HFA) 108 (90 Base) MCG/ACT inhaler Inhale 1-2 puffs into the lungs every 6 (six) hours as needed.     Marland Kitchen CARAFATE 1 GM/10ML suspension Take 10 mL (1 g total) by mouth Two (2) times a day.  0  . digoxin (LANOXIN) 0.125 MG tablet Take 0.125 mg by mouth daily.     Marland Kitchen diltiazem (CARDIZEM CD) 120 MG 24 hr capsule Take 120 mg by mouth daily.     . enalapril (VASOTEC) 20 MG tablet Take 20 mg by mouth daily.     . finasteride (PROSCAR) 5 MG tablet Take 5 mg by mouth daily.    . fluticasone (FLONASE) 50 MCG/ACT nasal spray ONE SPRAY IN EACH NOSTRIL AT NIGHT AS DIRECTED  11  . fluticasone (FLOVENT HFA) 220 MCG/ACT inhaler Inhale into the lungs.    . furosemide (LASIX) 20 MG tablet Take 20 mg by mouth.    . gabapentin (NEURONTIN) 300 MG capsule Take 300 mg by mouth 2 (two) times daily.     Marland Kitchen HYDROcodone-acetaminophen (NORCO) 10-325 MG tablet Take 1 tablet by mouth every 8 (eight) hours as needed. for pain  0  . metoprolol (LOPRESSOR) 50 MG tablet Take 50 mg by mouth 2 (two) times daily.     Marland Kitchen morphine (ROXANOL) 20 MG/ML concentrated solution Take 0.5 mLs (10 mg total) by mouth every 6 (six) hours as needed for severe pain. 60 mL 0  . omeprazole (PRILOSEC) 20 MG capsule Take 1 capsule (20 mg total) by mouth daily. [place]  capsule 2  . ondansetron (ZOFRAN) 4 MG tablet Take 1 tablet (4 mg total) by mouth every 8 (eight) hours as needed for nausea or vomiting. 10 tablet 0  . prochlorperazine (COMPAZINE) 10 MG tablet Take 1 tablet (10 mg total) by mouth every 6 (six) hours as needed for nausea or vomiting. 90 tablet 5  . promethazine (PHENERGAN) 25 MG suppository Place 1 suppository (25 mg total) rectally every 6 (six) hours as needed for nausea or vomiting. 6 each 1  . senna (SENOKOT) 8.6 MG TABS tablet  Take 1 tablet (8.6 mg total) by mouth 2 (two) times daily. 14 each 0  . tamsulosin (FLOMAX) 0.4 MG CAPS capsule Take 0.4 mg by mouth daily.  3  . warfarin (COUMADIN) 4 MG tablet Take 2 tablets by mouth with evening meal     No current facility-administered medications for this visit.    Facility-Administered Medications Ordered in Other Visits  Medication Dose Route Frequency Provider Last Rate Last Dose  . sodium chloride 0.9 % injection 10 mL  10 mL Intravenous PRN Lloyd Huger, MD   10 mL at 12/23/14 0945  . sodium chloride 0.9 % injection 10 mL  10 mL Intravenous PRN Lloyd Huger, MD   10 mL at 01/15/15 1512  . sodium chloride flush (NS) 0.9 % injection 10 mL  10 mL Intravenous PRN Lloyd Huger, MD   10 mL at 11/10/15 0938    OBJECTIVE: There were no vitals filed for this visit.   There is no height or weight on file to calculate BMI.    ECOG FS:2 - Symptomatic, <50% confined to bed  General: Well-developed, well-nourished, no acute distress. Eyes: anicteric sclera. HEENT: Oropharynx clear without exudate or erythema. No palpable lymphadenopathy. Lungs: Clear to auscultation bilaterally. Heart: Regular rate and rhythm. No rubs, murmurs, or gallops. Abdomen: Soft, nontender, nondistended. No organomegaly noted, normoactive bowel sounds. Musculoskeletal: No edema, cyanosis, or clubbing. Neuro: Alert, answering all questions appropriately. Cranial nerves grossly intact. Skin: No rashes or petechiae noted. Psych: Normal affect.   LAB RESULTS:  Lab Results  Component Value Date   NA 140 05/06/2016   K 3.8 05/06/2016   CL 103 05/06/2016   CO2 28 05/06/2016   GLUCOSE 100 (H) 05/06/2016   BUN 15 05/06/2016   CREATININE 1.08 05/06/2016   CALCIUM 9.0 05/06/2016   PROT 6.2 (L) 05/06/2016   ALBUMIN 3.3 (L) 05/06/2016   AST 21 05/06/2016   ALT 11 (L) 05/06/2016   ALKPHOS 79 05/06/2016   BILITOT 1.3 (H) 05/06/2016   GFRNONAA >60 05/06/2016   GFRAA >60 05/06/2016     Lab Results  Component Value Date   WBC 7.9 05/06/2016   NEUTROABS 3.8 04/27/2016   HGB 13.9 05/06/2016   HCT 40.7 05/06/2016   MCV 85.1 05/06/2016   PLT 217 05/06/2016     STUDIES: No results found.  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  PLAN:    1. 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 Endoscopy on February 26, 2016 also revealed residual adenocarcinoma on biopsy. Patient completed 4 cycles of single agent Taxol on December 15, 2015. Patient is not a surgical candidate. Patient was referred to radiation oncology and although unconventional, plan to give him additional XRT starting next week. Because of this, will hold Keytruda and proceed with weekly concurrent carboplatinum and Taxol. Return to clinic as previously scheduled  consideration of cycle 1 of carboplatinum and Taxol. 2. Hip fracture: Continued evaluation and treatment per orthopedics. Follow-up with PCP on Friday after falling last week. 3. Anemia: Resolved. 4. Reflux: Continue Prilosec as prescribed. Continue with soft and liquid foods per PCP. Monitor.  5: Pain: Perscribed 17m/0.5ml Roxanol q 6 hours for pain. Educated on how to take liquid pain medication. 6. Nutrition: Patient had consultation with dietary today. Now that he is living with his oldest son, he is more agreeable for consideration of feeding tube. He will discuss it further with his family and make a final decision at his next clinic visit in several days. 7. Advanced directives: Patient was encouraged to fill out his advanced directives and discussed end-of-life issues with his family.  Approximately 30 minutes was spent in discussion of which greater than 50% was consultation. .  Patient expressed understanding and was in agreement with this plan. He also understands that He can call clinic at any time with any  questions, concerns, or complaints.    TLloyd Huger MD 05/11/16 8:33 AM

## 2016-05-11 NOTE — Progress Notes (Signed)
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  INTERVAL HISTORY: Patient returns to clinic today for further evaluation and initiation of concurrent XRT and carboplatinum. He continues to have difficulty swallowing with nausea and vomiting, but has decided against getting a PEG tube. He also  continues to lose weight. He continues to have occasional falls. He has no neurologic complaints. He denies any shortness of breath or chest pain. He denies any constipation or diarrhea. He has no melena or hematochezia. He has no urinary complaints. Patient offers no further specific complaints today.  REVIEW OF SYSTEMS:   Review of Systems  Constitutional: Negative for fever, malaise/fatigue and weight loss.  Respiratory: Negative.  Negative for shortness of breath.   Cardiovascular: Negative for chest pain and leg swelling.  Gastrointestinal: Positive for heartburn, nausea and vomiting. Negative for abdominal pain, blood in stool and melena.  Genitourinary: Negative.   Musculoskeletal: Positive for joint pain.  Neurological: Negative.  Negative for weakness.  Endo/Heme/Allergies: Does not bruise/bleed easily.  Psychiatric/Behavioral: Negative.  The patient is not nervous/anxious.    As per HPI. Otherwise, a complete review of systems is negative.   PAST MEDICAL HISTORY:  Hypertension, sleep apnea, atrial fibrillation.  PAST SURGICAL HISTORY: Negative.  FAMILY HISTORY: Reviewed and unchanged. No report of malignancy or chronic disease.     ADVANCED DIRECTIVES:    HEALTH MAINTENANCE: Social History  Substance Use Topics  . Smoking status: Current  Some Day Smoker    Years: 62.00  . Smokeless tobacco: Current User    Types: Snuff  . Alcohol use No     No Known Allergies  Current Outpatient Prescriptions  Medication Sig Dispense Refill  . albuterol (PROVENTIL HFA;VENTOLIN HFA) 108 (90 Base) MCG/ACT inhaler Inhale 1-2 puffs into the lungs every 6 (six) hours as needed.     Marland Kitchen CARAFATE 1 GM/10ML suspension Take 10 mL (1 g total) by mouth Two (2) times a day.  0  . digoxin (LANOXIN) 0.125 MG tablet Take 0.125 mg by mouth daily.     Marland Kitchen diltiazem (CARDIZEM CD) 120 MG 24 hr capsule Take 120 mg by mouth daily.     . enalapril (VASOTEC) 20 MG tablet Take 20 mg by mouth daily.     . finasteride (PROSCAR) 5 MG tablet Take 5 mg by mouth daily.    . fluticasone (FLONASE) 50 MCG/ACT nasal spray ONE SPRAY IN EACH NOSTRIL AT NIGHT AS DIRECTED  11  . fluticasone (FLOVENT HFA) 220 MCG/ACT inhaler Inhale into the lungs.    . furosemide (LASIX) 20 MG tablet Take 20 mg by mouth.    . gabapentin (NEURONTIN) 300 MG capsule Take 300 mg by mouth 2 (two) times daily.     . metoprolol (LOPRESSOR) 50 MG tablet Take 50 mg by mouth 2 (two) times daily.     Marland Kitchen morphine (ROXANOL) 20 MG/ML concentrated solution Take 0.5 mLs (10 mg total) by mouth every 6 (six) hours as needed for severe pain. 60 mL 0  . omeprazole (PRILOSEC) 20 MG capsule Take 1 capsule (20 mg total) by mouth daily. 30 capsule 2  . ondansetron (ZOFRAN) 4 MG tablet Take 1 tablet (4 mg total) by mouth every  8 (eight) hours as needed for nausea or vomiting. 10 tablet 0  . prochlorperazine (COMPAZINE) 10 MG tablet Take 1 tablet (10 mg total) by mouth every 6 (six) hours as needed for nausea or vomiting. 90 tablet 5  . promethazine (PHENERGAN) 25 MG suppository Place 1 suppository (25 mg total) rectally every 6 (six) hours as needed for nausea or vomiting. 6 each 1  . senna (SENOKOT) 8.6 MG TABS tablet Take 1 tablet (8.6 mg total) by mouth 2 (two) times daily. 14 each 0  . tamsulosin (FLOMAX) 0.4 MG CAPS  capsule Take 0.4 mg by mouth daily.  3  . warfarin (COUMADIN) 4 MG tablet Take 2 tablets by mouth with evening meal     No current facility-administered medications for this visit.    Facility-Administered Medications Ordered in Other Visits  Medication Dose Route Frequency Provider Last Rate Last Dose  . sodium chloride 0.9 % injection 10 mL  10 mL Intravenous PRN Lloyd Huger, MD   10 mL at 12/23/14 0945  . sodium chloride 0.9 % injection 10 mL  10 mL Intravenous PRN Lloyd Huger, MD   10 mL at 01/15/15 1512  . sodium chloride flush (NS) 0.9 % injection 10 mL  10 mL Intravenous PRN Lloyd Huger, MD   10 mL at 11/10/15 8938    OBJECTIVE: There were no vitals filed for this visit.   There is no height or weight on file to calculate BMI.    ECOG FS:2 - Symptomatic, <50% confined to bed  General: Well-developed, well-nourished, no acute distress. Eyes: anicteric sclera. HEENT: Oropharynx clear without exudate or erythema. No palpable lymphadenopathy. Lungs: Clear to auscultation bilaterally. Heart: Regular rate and rhythm. No rubs, murmurs, or gallops. Abdomen: Soft, nontender, nondistended. No organomegaly noted, normoactive bowel sounds. Musculoskeletal: No edema, cyanosis, or clubbing. Neuro: Alert, answering all questions appropriately. Cranial nerves grossly intact. Skin: No rashes or petechiae noted. Psych: Normal affect.   LAB RESULTS:  Lab Results  Component Value Date   NA 137 05/12/2016   K 3.0 (L) 05/12/2016   CL 103 05/12/2016   CO2 25 05/12/2016   GLUCOSE 148 (H) 05/12/2016   BUN 23 (H) 05/12/2016   CREATININE 0.97 05/12/2016   CALCIUM 8.6 (L) 05/12/2016   PROT 5.8 (L) 05/12/2016   ALBUMIN 3.2 (L) 05/12/2016   AST 31 05/12/2016   ALT 15 (L) 05/12/2016   ALKPHOS 72 05/12/2016   BILITOT 1.0 05/12/2016   GFRNONAA >60 05/12/2016   GFRAA >60 05/12/2016    Lab Results  Component Value Date   WBC 7.0 05/12/2016   NEUTROABS 5.0 05/12/2016    HGB 13.9 05/12/2016   HCT 40.6 05/12/2016   MCV 84.9 05/12/2016   PLT 224 05/12/2016     STUDIES: No results found.  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  PLAN:    1. 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 Endoscopy on February 26, 2016 also revealed residual adenocarcinoma on biopsy. Patient completed 4 cycles of single agent Taxol on December 15, 2015. Patient is not a surgical candidate. Patient was referred to radiation oncology and although unconventional, plan to give him additional XRT starting next week. Because of this, Beryle Flock Has been discontinued. Proceed with cycle 1 of weekly concurrent carboplatinum and Taxol. Return to clinic in 1 week for consideration of cycle 2.  2. Hip fracture: Continued evaluation and treatment  per orthopedics.  3. Anemia: Resolved. 4. Reflux: Continue Prilosec as prescribed. Continue with soft and liquid foods per PCP. Monitor.  5: Pain: Perscribed 24m/0.5ml Roxanol q 6 hours for pain. Educated on how to take liquid pain medication. Patient also received 2 mg IV morphine in clinic today. 6. Nutrition: Appreciate dietary input, but patient has declined feeding tube at this time.  7. Advanced directives: Patient was encouraged to fill out his advanced directives and discussed end-of-life issues with his family.  Approximately 30 minutes was spent in discussion of which greater than 50% was consultation.  Patient expressed understanding and was in agreement with this plan. He also understands that He can call clinic at any time with any questions, concerns, or complaints.    TLloyd Huger MD 05/16/16 9:02 AM

## 2016-05-12 ENCOUNTER — Inpatient Hospital Stay: Payer: Medicare Other

## 2016-05-12 ENCOUNTER — Ambulatory Visit
Admission: RE | Admit: 2016-05-12 | Discharge: 2016-05-12 | Disposition: A | Discharge status: Home / Self Care | Payer: Medicare Other | Source: Ambulatory Visit | Attending: Radiation Oncology | Admitting: Radiation Oncology

## 2016-05-12 ENCOUNTER — Inpatient Hospital Stay (HOSPITAL_BASED_OUTPATIENT_CLINIC_OR_DEPARTMENT_OTHER): Payer: Medicare Other | Admitting: Oncology

## 2016-05-12 ENCOUNTER — Ambulatory Visit: Payer: Medicare Other

## 2016-05-12 VITALS — BP 110/70 | HR 77 | Temp 95.5°F | Resp 18

## 2016-05-12 DIAGNOSIS — C155 Malignant neoplasm of lower third of esophagus: Secondary | ICD-10-CM | POA: Diagnosis not present

## 2016-05-12 DIAGNOSIS — R1310 Dysphagia, unspecified: Secondary | ICD-10-CM | POA: Diagnosis not present

## 2016-05-12 DIAGNOSIS — R634 Abnormal weight loss: Secondary | ICD-10-CM | POA: Diagnosis not present

## 2016-05-12 DIAGNOSIS — K219 Gastro-esophageal reflux disease without esophagitis: Secondary | ICD-10-CM | POA: Diagnosis not present

## 2016-05-12 DIAGNOSIS — Z7901 Long term (current) use of anticoagulants: Secondary | ICD-10-CM

## 2016-05-12 DIAGNOSIS — I4891 Unspecified atrial fibrillation: Secondary | ICD-10-CM

## 2016-05-12 DIAGNOSIS — Z8781 Personal history of (healed) traumatic fracture: Secondary | ICD-10-CM

## 2016-05-12 DIAGNOSIS — G4730 Sleep apnea, unspecified: Secondary | ICD-10-CM

## 2016-05-12 DIAGNOSIS — R112 Nausea with vomiting, unspecified: Secondary | ICD-10-CM

## 2016-05-12 DIAGNOSIS — Z7189 Other specified counseling: Secondary | ICD-10-CM

## 2016-05-12 DIAGNOSIS — F17210 Nicotine dependence, cigarettes, uncomplicated: Secondary | ICD-10-CM

## 2016-05-12 DIAGNOSIS — Z79899 Other long term (current) drug therapy: Secondary | ICD-10-CM

## 2016-05-12 DIAGNOSIS — I10 Essential (primary) hypertension: Secondary | ICD-10-CM

## 2016-05-12 LAB — COMPREHENSIVE METABOLIC PANEL
ALBUMIN: 3.2 g/dL — AB (ref 3.5–5.0)
ALT: 15 U/L — ABNORMAL LOW (ref 17–63)
ANION GAP: 9 (ref 5–15)
AST: 31 U/L (ref 15–41)
Alkaline Phosphatase: 72 U/L (ref 38–126)
BUN: 23 mg/dL — ABNORMAL HIGH (ref 6–20)
CHLORIDE: 103 mmol/L (ref 101–111)
CO2: 25 mmol/L (ref 22–32)
Calcium: 8.6 mg/dL — ABNORMAL LOW (ref 8.9–10.3)
Creatinine, Ser: 0.97 mg/dL (ref 0.61–1.24)
GFR calc non Af Amer: >60 mL/min (ref >60)
GLUCOSE: 148 mg/dL — AB (ref 65–99)
Potassium: 3 mmol/L — ABNORMAL LOW (ref 3.5–5.1)
SODIUM: 137 mmol/L (ref 135–145)
Total Bilirubin: 1 mg/dL (ref 0.3–1.2)
Total Protein: 5.8 g/dL — ABNORMAL LOW (ref 6.5–8.1)

## 2016-05-12 LAB — CBC WITH DIFFERENTIAL/PLATELET
BASOS PCT: 1 %
Basophils Absolute: 0 10*3/uL (ref 0–0.1)
EOS ABS: 0.1 10*3/uL (ref 0–0.7)
EOS PCT: 2 %
HCT: 40.6 % (ref 40.0–52.0)
Hemoglobin: 13.9 g/dL (ref 13.0–18.0)
LYMPHS ABS: 1.2 10*3/uL (ref 1.0–3.6)
Lymphocytes Relative: 18 %
MCH: 29 pg (ref 26.0–34.0)
MCHC: 34.2 g/dL (ref 32.0–36.0)
MCV: 84.9 fL (ref 80.0–100.0)
MONOS PCT: 9 %
Monocytes Absolute: 0.6 10*3/uL (ref 0.2–1.0)
NEUTROS PCT: 72 %
Neutro Abs: 5 10*3/uL (ref 1.4–6.5)
PLATELETS: 224 10*3/uL (ref 150–440)
RBC: 4.78 MIL/uL (ref 4.40–5.90)
RDW: 17.1 % — ABNORMAL HIGH (ref 11.5–14.5)
WBC: 7 10*3/uL (ref 3.8–10.6)

## 2016-05-12 MED ORDER — DEXAMETHASONE SODIUM PHOSPHATE 10 MG/ML IJ SOLN
10.0000 mg | Freq: Once | INTRAMUSCULAR | Status: AC
  Administered 2016-05-12: 10 mg via INTRAVENOUS
  Filled 2016-05-12: qty 1

## 2016-05-12 MED ORDER — SODIUM CHLORIDE 0.9 % IV SOLN
Freq: Once | INTRAVENOUS | Status: AC
  Administered 2016-05-12: 12:00:00 via INTRAVENOUS
  Filled 2016-05-12: qty 1000

## 2016-05-12 MED ORDER — MORPHINE SULFATE (PF) 2 MG/ML IV SOLN
2.0000 mg | Freq: Once | INTRAVENOUS | Status: AC
  Administered 2016-05-12: 2 mg via INTRAVENOUS
  Filled 2016-05-12: qty 1

## 2016-05-12 MED ORDER — FAMOTIDINE IN NACL 20-0.9 MG/50ML-% IV SOLN
20.0000 mg | Freq: Once | INTRAVENOUS | Status: AC
  Administered 2016-05-12: 20 mg via INTRAVENOUS
  Filled 2016-05-12: qty 50

## 2016-05-12 MED ORDER — PACLITAXEL CHEMO INJECTION 300 MG/50ML
45.0000 mg/m2 | Freq: Once | INTRAVENOUS | Status: AC
  Administered 2016-05-12: 90 mg via INTRAVENOUS
  Filled 2016-05-12: qty 15

## 2016-05-12 MED ORDER — HEPARIN SOD (PORK) LOCK FLUSH 100 UNIT/ML IV SOLN
500.0000 [IU] | Freq: Once | INTRAVENOUS | Status: AC | PRN
Start: 2016-05-12 — End: 2016-05-12
  Administered 2016-05-12: 500 [IU]
  Filled 2016-05-12: qty 5

## 2016-05-12 MED ORDER — SODIUM CHLORIDE 0.9 % IV SOLN
10.0000 mg | Freq: Once | INTRAVENOUS | Status: DC
Start: 2016-05-12 — End: 2016-05-12

## 2016-05-12 MED ORDER — PALONOSETRON HCL INJECTION 0.25 MG/5ML
0.2500 mg | Freq: Once | INTRAVENOUS | Status: AC
  Administered 2016-05-12: 0.25 mg via INTRAVENOUS
  Filled 2016-05-12: qty 5

## 2016-05-12 MED ORDER — DIPHENHYDRAMINE HCL 50 MG/ML IJ SOLN
25.0000 mg | Freq: Once | INTRAMUSCULAR | Status: AC
  Administered 2016-05-12: 25 mg via INTRAVENOUS
  Filled 2016-05-12: qty 1

## 2016-05-12 MED ORDER — SODIUM CHLORIDE 0.9 % IV SOLN
Freq: Once | INTRAVENOUS | Status: AC
  Administered 2016-05-12: 15:00:00 via INTRAVENOUS
  Filled 2016-05-12: qty 250

## 2016-05-12 MED ORDER — SODIUM CHLORIDE 0.9 % IV SOLN
210.0000 mg | Freq: Once | INTRAVENOUS | Status: AC
  Administered 2016-05-12: 210 mg via INTRAVENOUS
  Filled 2016-05-12: qty 21

## 2016-05-12 NOTE — Progress Notes (Signed)
Pt was seen in infusion area. Complains of pain. Pt will receive 2mg  morphine IV while in clinic.

## 2016-05-12 NOTE — Progress Notes (Signed)
Nutrition Follow-up:  Nutrition follow-up completed during infusion this afternoon.  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  Not a surgical candidate.  Patient currently receiving radiation therapy and chemotherapy.   Pateint received decadron, pepsid, aloxi, and morphine for pain with chemotherapy today during infusion.    Patient reports drank ensure this am prior to coming to clinic.  Reports ate few bites of applesause and 1/2 grilled cheese sandwich for lunch today but "it came back up." Patient had to use emesis bag times 2 during visit this pm.  Patient reports yesterday ate 2 bowls of ice cream, scrambled egg, applesauce and drank ensure and it all stayed down.     Labs: K 3.0, glucose 148  Anthropometrics:   No new weight since 1/19 of 186 pounds   NUTRITION DIAGNOSIS: Inadequate oral intake continues   MALNUTRITION DIAGNOSIS: Severe malnutrition continues   INTERVENTION:   Son not with patient during infusion today.   Reminded patient to consume soft, liquid type foods.   Encouraged continued intake of oral nutrition supplements with at least 350 calories or more.   Spoke with Dr Grayland Ormond and patient wants to hold off on feeding tube at this time.      MONITORING, EVALUATION, GOAL: Patient will consume adequate calories and protein to prevent further weight loss.   NEXT VISIT: Feb 1 during infusion   Jeremy Gordon, Jeremy Gordon, Jeremy Gordon (pager)

## 2016-05-13 ENCOUNTER — Ambulatory Visit
Admission: RE | Admit: 2016-05-13 | Discharge: 2016-05-13 | Disposition: A | Discharge status: Home / Self Care | Payer: Medicare Other | Source: Ambulatory Visit | Attending: Radiation Oncology | Admitting: Radiation Oncology

## 2016-05-13 DIAGNOSIS — C155 Malignant neoplasm of lower third of esophagus: Secondary | ICD-10-CM | POA: Diagnosis not present

## 2016-05-16 ENCOUNTER — Ambulatory Visit: Payer: Medicare Other

## 2016-05-16 DIAGNOSIS — Z7189 Other specified counseling: Secondary | ICD-10-CM | POA: Insufficient documentation

## 2016-05-17 ENCOUNTER — Ambulatory Visit
Admission: RE | Admit: 2016-05-17 | Discharge: 2016-05-17 | Disposition: A | Discharge status: Home / Self Care | Payer: Medicare Other | Source: Ambulatory Visit | Attending: Radiation Oncology | Admitting: Radiation Oncology

## 2016-05-17 DIAGNOSIS — C155 Malignant neoplasm of lower third of esophagus: Secondary | ICD-10-CM | POA: Diagnosis not present

## 2016-05-18 ENCOUNTER — Ambulatory Visit
Admission: RE | Admit: 2016-05-18 | Discharge: 2016-05-18 | Disposition: A | Discharge status: Home / Self Care | Payer: Medicare Other | Source: Ambulatory Visit | Attending: Radiation Oncology | Admitting: Radiation Oncology

## 2016-05-18 DIAGNOSIS — C155 Malignant neoplasm of lower third of esophagus: Secondary | ICD-10-CM | POA: Diagnosis not present

## 2016-05-18 NOTE — Progress Notes (Signed)
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  INTERVAL HISTORY: Patient returns to clinic today for further evaluation and consideration of cycle 2 of weekly carboplatinum along with his concurrent XRT. He continues to have difficulty swallowing with nausea and vomiting, but states this has improved over the last week since initiating treatment. He continues to refuse PEG tube. He also continues to lose weight. He continues to have occasional falls. He has no neurologic complaints. He denies any shortness of breath or chest pain. He denies any constipation or diarrhea. He has no melena or hematochezia. He has no urinary complaints. Patient offers no further specific complaints today.  REVIEW OF SYSTEMS:   Review of Systems  Constitutional: Negative for fever, malaise/fatigue and weight loss.  Respiratory: Negative.  Negative for shortness of breath.   Cardiovascular: Negative for chest pain and leg swelling.  Gastrointestinal: Positive for heartburn, nausea and vomiting. Negative for abdominal pain, blood in stool and melena.  Genitourinary: Negative.   Musculoskeletal: Positive for joint pain.  Neurological: Negative.  Negative for weakness.  Endo/Heme/Allergies: Does not bruise/bleed easily.  Psychiatric/Behavioral: Negative.  The patient is not nervous/anxious.    As per HPI. Otherwise, a complete review of systems is negative.   PAST MEDICAL HISTORY:  Hypertension, sleep apnea, atrial fibrillation.  PAST SURGICAL HISTORY: Negative.  FAMILY HISTORY: Reviewed and unchanged. No report of malignancy or chronic disease.     ADVANCED  DIRECTIVES:    HEALTH MAINTENANCE: Social History  Substance Use Topics  . Smoking status: Current Some Day Smoker    Years: 62.00  . Smokeless tobacco: Current User    Types: Snuff  . Alcohol use No     No Known Allergies  Current Outpatient Prescriptions  Medication Sig Dispense Refill  . albuterol (PROVENTIL HFA;VENTOLIN HFA) 108 (90 Base) MCG/ACT inhaler Inhale 1-2 puffs into the lungs every 6 (six) hours as needed.     Marland Kitchen CARAFATE 1 GM/10ML suspension Take 10 mL (1 g total) by mouth Two (2) times a day.  0  . digoxin (LANOXIN) 0.125 MG tablet Take 0.125 mg by mouth daily.     Marland Kitchen diltiazem (CARDIZEM CD) 120 MG 24 hr capsule Take 120 mg by mouth daily.     . enalapril (VASOTEC) 20 MG tablet Take 20 mg by mouth daily.     . finasteride (PROSCAR) 5 MG tablet Take 5 mg by mouth daily.    . fluticasone (FLONASE) 50 MCG/ACT nasal spray ONE SPRAY IN EACH NOSTRIL AT NIGHT AS DIRECTED  11  . fluticasone (FLOVENT HFA) 220 MCG/ACT inhaler Inhale into the lungs.    . furosemide (LASIX) 20 MG tablet Take 20 mg by mouth.    . gabapentin (NEURONTIN) 300 MG capsule Take 300 mg by mouth 2 (two) times daily.     . metoprolol (LOPRESSOR) 50 MG tablet Take 50 mg by mouth 2 (two) times daily.     Marland Kitchen morphine (ROXANOL) 20 MG/ML concentrated solution Take 0.5 mLs (10 mg total) by mouth every 6 (six) hours as needed for severe pain. 60 mL 0  . omeprazole (PRILOSEC) 20 MG capsule Take 1 capsule (20 mg total) by mouth daily. 30 capsule 2  .  ondansetron (ZOFRAN) 4 MG tablet Take 1 tablet (4 mg total) by mouth every 8 (eight) hours as needed for nausea or vomiting. 10 tablet 0  . prochlorperazine (COMPAZINE) 10 MG tablet Take 1 tablet (10 mg total) by mouth every 6 (six) hours as needed for nausea or vomiting. 90 tablet 5  . promethazine (PHENERGAN) 25 MG suppository Place 1 suppository (25 mg total) rectally every 6 (six) hours as needed for nausea or vomiting. 6 each 1  . senna (SENOKOT) 8.6 MG TABS tablet  Take 1 tablet (8.6 mg total) by mouth 2 (two) times daily. 14 each 0  . tamsulosin (FLOMAX) 0.4 MG CAPS capsule Take 0.4 mg by mouth daily.  3  . warfarin (COUMADIN) 4 MG tablet Take 2 tablets by mouth with evening meal     No current facility-administered medications for this visit.    Facility-Administered Medications Ordered in Other Visits  Medication Dose Route Frequency Provider Last Rate Last Dose  . sodium chloride 0.9 % injection 10 mL  10 mL Intravenous PRN Lloyd Huger, MD   10 mL at 12/23/14 0945  . sodium chloride 0.9 % injection 10 mL  10 mL Intravenous PRN Lloyd Huger, MD   10 mL at 01/15/15 1512  . sodium chloride flush (NS) 0.9 % injection 10 mL  10 mL Intravenous PRN Lloyd Huger, MD   10 mL at 11/10/15 0939    OBJECTIVE: Vitals:   05/19/16 1102  BP: 107/69  Pulse: (!) 114  Temp: 97.5 F (36.4 C)     Body mass index is 30.19 kg/m.    ECOG FS:2 - Symptomatic, <50% confined to bed  General: Well-developed, well-nourished, no acute distress. Eyes: anicteric sclera. HEENT: Oropharynx clear without exudate or erythema. No palpable lymphadenopathy. Lungs: Clear to auscultation bilaterally. Heart: Regular rate and rhythm. No rubs, murmurs, or gallops. Abdomen: Soft, nontender, nondistended. No organomegaly noted, normoactive bowel sounds. Musculoskeletal: No edema, cyanosis, or clubbing. Neuro: Alert, answering all questions appropriately. Cranial nerves grossly intact. Skin: No rashes or petechiae noted. Psych: Normal affect.   LAB RESULTS:  Lab Results  Component Value Date   NA 138 05/19/2016   K 3.1 (L) 05/19/2016   CL 105 05/19/2016   CO2 29 05/19/2016   GLUCOSE 110 (H) 05/19/2016   BUN 17 05/19/2016   CREATININE 0.88 05/19/2016   CALCIUM 8.3 (L) 05/19/2016   PROT 5.2 (L) 05/19/2016   ALBUMIN 2.9 (L) 05/19/2016   AST 23 05/19/2016   ALT 21 05/19/2016   ALKPHOS 74 05/19/2016   BILITOT 1.1 05/19/2016   GFRNONAA >60 05/19/2016    GFRAA >60 05/19/2016    Lab Results  Component Value Date   WBC 2.8 (L) 05/19/2016   NEUTROABS 1.9 05/19/2016   HGB 12.0 (L) 05/19/2016   HCT 35.0 (L) 05/19/2016   MCV 85.0 05/19/2016   PLT 117 (L) 05/19/2016     STUDIES: No results found.  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  PLAN:    1. 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 Endoscopy on February 26, 2016 also revealed residual adenocarcinoma on biopsy. Patient completed 4 cycles of single agent Taxol on December 15, 2015. Patient is not a surgical candidate. Patient was referred to radiation oncology and although unconventional, plan to give him additional XRT.  Because of this, Beryle Flock Has been discontinued. Patient's final treatment with XRT will be on  June 06, 2016.  Proceed with cycle 2 of weekly concurrent carboplatinum and Taxol. Return to clinic in 1 week for consideration of cycle 2.  2. Hip fracture: Continued evaluation and treatment per orthopedics.  3. Anemia: Resolved. 4. Reflux: Continue Prilosec as prescribed. Continue with soft and liquid foods per PCP. Monitor.  5: Pain: Perscribed 50m/0.5ml Roxanol q 6 hours for pain. Educated on how to take liquid pain medication. Patient also received 2 mg IV morphine in clinic today. 6. Nutrition: Appreciate dietary input, but patient has declined feeding tube at this time.  7. Advanced directives: Patient was encouraged to fill out his advanced directives and discussed end-of-life issues with his family.   Patient expressed understanding and was in agreement with this plan. He also understands that He can call clinic at any time with any questions, concerns, or complaints.    TLloyd Huger MD 05/22/16 8:31 AM

## 2016-05-19 ENCOUNTER — Inpatient Hospital Stay: Payer: Medicare Other

## 2016-05-19 ENCOUNTER — Inpatient Hospital Stay: Payer: Medicare Other | Attending: Oncology

## 2016-05-19 ENCOUNTER — Inpatient Hospital Stay (HOSPITAL_BASED_OUTPATIENT_CLINIC_OR_DEPARTMENT_OTHER): Payer: Medicare Other | Admitting: Oncology

## 2016-05-19 ENCOUNTER — Ambulatory Visit
Admission: RE | Admit: 2016-05-19 | Discharge: 2016-05-19 | Disposition: A | Discharge status: Home / Self Care | Payer: Medicare Other | Source: Ambulatory Visit | Attending: Radiation Oncology | Admitting: Radiation Oncology

## 2016-05-19 VITALS — BP 107/69 | HR 114 | Temp 97.5°F | Wt 187.1 lb

## 2016-05-19 DIAGNOSIS — R112 Nausea with vomiting, unspecified: Secondary | ICD-10-CM | POA: Diagnosis not present

## 2016-05-19 DIAGNOSIS — I4891 Unspecified atrial fibrillation: Secondary | ICD-10-CM | POA: Insufficient documentation

## 2016-05-19 DIAGNOSIS — F17210 Nicotine dependence, cigarettes, uncomplicated: Secondary | ICD-10-CM | POA: Diagnosis not present

## 2016-05-19 DIAGNOSIS — Z8781 Personal history of (healed) traumatic fracture: Secondary | ICD-10-CM | POA: Diagnosis not present

## 2016-05-19 DIAGNOSIS — I10 Essential (primary) hypertension: Secondary | ICD-10-CM | POA: Insufficient documentation

## 2016-05-19 DIAGNOSIS — G4730 Sleep apnea, unspecified: Secondary | ICD-10-CM

## 2016-05-19 DIAGNOSIS — Z79899 Other long term (current) drug therapy: Secondary | ICD-10-CM | POA: Insufficient documentation

## 2016-05-19 DIAGNOSIS — M2550 Pain in unspecified joint: Secondary | ICD-10-CM | POA: Diagnosis not present

## 2016-05-19 DIAGNOSIS — C155 Malignant neoplasm of lower third of esophagus: Secondary | ICD-10-CM

## 2016-05-19 DIAGNOSIS — R1310 Dysphagia, unspecified: Secondary | ICD-10-CM | POA: Insufficient documentation

## 2016-05-19 DIAGNOSIS — Z7901 Long term (current) use of anticoagulants: Secondary | ICD-10-CM | POA: Insufficient documentation

## 2016-05-19 DIAGNOSIS — Z5112 Encounter for antineoplastic immunotherapy: Secondary | ICD-10-CM | POA: Insufficient documentation

## 2016-05-19 DIAGNOSIS — K219 Gastro-esophageal reflux disease without esophagitis: Secondary | ICD-10-CM

## 2016-05-19 LAB — COMPREHENSIVE METABOLIC PANEL
ALBUMIN: 2.9 g/dL — AB (ref 3.5–5.0)
ALK PHOS: 74 U/L (ref 38–126)
ALT: 21 U/L (ref 17–63)
AST: 23 U/L (ref 15–41)
Anion gap: 4 — ABNORMAL LOW (ref 5–15)
BUN: 17 mg/dL (ref 6–20)
CO2: 29 mmol/L (ref 22–32)
Calcium: 8.3 mg/dL — ABNORMAL LOW (ref 8.9–10.3)
Chloride: 105 mmol/L (ref 101–111)
Creatinine, Ser: 0.88 mg/dL (ref 0.61–1.24)
GFR calc non Af Amer: >60 mL/min (ref >60)
GLUCOSE: 110 mg/dL — AB (ref 65–99)
Potassium: 3.1 mmol/L — ABNORMAL LOW (ref 3.5–5.1)
SODIUM: 138 mmol/L (ref 135–145)
TOTAL PROTEIN: 5.2 g/dL — AB (ref 6.5–8.1)
Total Bilirubin: 1.1 mg/dL (ref 0.3–1.2)

## 2016-05-19 LAB — CBC WITH DIFFERENTIAL/PLATELET
Basophils Absolute: 0 10*3/uL (ref 0–0.1)
Basophils Relative: 1 %
EOS ABS: 0.1 10*3/uL (ref 0–0.7)
Eosinophils Relative: 3 %
HCT: 35 % — ABNORMAL LOW (ref 40.0–52.0)
HEMOGLOBIN: 12 g/dL — AB (ref 13.0–18.0)
LYMPHS ABS: 0.6 10*3/uL — AB (ref 1.0–3.6)
Lymphocytes Relative: 22 %
MCH: 29.1 pg (ref 26.0–34.0)
MCHC: 34.2 g/dL (ref 32.0–36.0)
MCV: 85 fL (ref 80.0–100.0)
Monocytes Absolute: 0.2 10*3/uL (ref 0.2–1.0)
Monocytes Relative: 7 %
NEUTROS ABS: 1.9 10*3/uL (ref 1.4–6.5)
NEUTROS PCT: 67 %
Platelets: 117 10*3/uL — ABNORMAL LOW (ref 150–440)
RBC: 4.11 MIL/uL — ABNORMAL LOW (ref 4.40–5.90)
RDW: 16.7 % — ABNORMAL HIGH (ref 11.5–14.5)
WBC: 2.8 10*3/uL — AB (ref 3.8–10.6)

## 2016-05-19 MED ORDER — DEXAMETHASONE SODIUM PHOSPHATE 10 MG/ML IJ SOLN
10.0000 mg | Freq: Once | INTRAMUSCULAR | Status: AC
  Administered 2016-05-19: 10 mg via INTRAVENOUS
  Filled 2016-05-19: qty 1

## 2016-05-19 MED ORDER — SODIUM CHLORIDE 0.9 % IV SOLN
Freq: Once | INTRAVENOUS | Status: AC
  Administered 2016-05-19: 12:00:00 via INTRAVENOUS
  Filled 2016-05-19: qty 1000

## 2016-05-19 MED ORDER — FAMOTIDINE IN NACL 20-0.9 MG/50ML-% IV SOLN
20.0000 mg | Freq: Once | INTRAVENOUS | Status: AC
  Administered 2016-05-19: 20 mg via INTRAVENOUS
  Filled 2016-05-19: qty 50

## 2016-05-19 MED ORDER — SODIUM CHLORIDE 0.9% FLUSH
10.0000 mL | INTRAVENOUS | Status: DC | PRN
Start: 2016-05-19 — End: 2016-05-19
  Administered 2016-05-19: 10 mL
  Filled 2016-05-19: qty 10

## 2016-05-19 MED ORDER — DIPHENHYDRAMINE HCL 50 MG/ML IJ SOLN
25.0000 mg | Freq: Once | INTRAMUSCULAR | Status: AC
  Administered 2016-05-19: 25 mg via INTRAVENOUS
  Filled 2016-05-19: qty 1

## 2016-05-19 MED ORDER — SODIUM CHLORIDE 0.9 % IV SOLN
Freq: Once | INTRAVENOUS | Status: AC
  Administered 2016-05-19: 14:00:00 via INTRAVENOUS
  Filled 2016-05-19: qty 100

## 2016-05-19 MED ORDER — PALONOSETRON HCL INJECTION 0.25 MG/5ML
0.2500 mg | Freq: Once | INTRAVENOUS | Status: AC
Start: 2016-05-19 — End: 2016-05-19
  Administered 2016-05-19: 0.25 mg via INTRAVENOUS
  Filled 2016-05-19: qty 5

## 2016-05-19 MED ORDER — SODIUM CHLORIDE 0.9 % IV SOLN: 10.0000 mg | Freq: Once | INTRAVENOUS | Status: DC

## 2016-05-19 MED ORDER — SODIUM CHLORIDE 0.9 % IV SOLN
Freq: Once | INTRAVENOUS | Status: AC
  Administered 2016-05-19: 14:00:00 via INTRAVENOUS
  Filled 2016-05-19: qty 4

## 2016-05-19 MED ORDER — SODIUM CHLORIDE 0.9 % IV SOLN: Freq: Once | INTRAVENOUS | Status: DC

## 2016-05-19 MED ORDER — SODIUM CHLORIDE 0.9 % IV SOLN
206.8000 mg | Freq: Once | INTRAVENOUS | Status: AC
  Administered 2016-05-19: 210 mg via INTRAVENOUS
  Filled 2016-05-19: qty 21

## 2016-05-19 MED ORDER — DEXAMETHASONE SODIUM PHOSPHATE 100 MG/10ML IJ SOLN: 10.0000 mg | Freq: Once | INTRAMUSCULAR | Status: DC

## 2016-05-19 MED ORDER — HEPARIN SOD (PORK) LOCK FLUSH 100 UNIT/ML IV SOLN
500.0000 [IU] | Freq: Once | INTRAVENOUS | Status: AC | PRN
  Administered 2016-05-19: 500 [IU]
  Filled 2016-05-19: qty 5

## 2016-05-19 MED ORDER — PACLITAXEL CHEMO INJECTION 300 MG/50ML
45.0000 mg/m2 | Freq: Once | INTRAVENOUS | Status: AC
  Administered 2016-05-19: 90 mg via INTRAVENOUS
  Filled 2016-05-19: qty 15

## 2016-05-19 NOTE — Progress Notes (Signed)
Nutrition Follow-up:  Nutrition follow-up completed in infusion this afternoon.  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  Not a surgical candidate.  Patient currently receiving radiation therapy and chemotherapy.    Patient talking about wife during visit this pm.  She is not expected to live very long and have been married for 52 years.  He and wife are living with son and daughter in law.  Patient ate grilled cheese sandwich, applesauce and cookie for lunch.  Getting ready to eat pudding as well.  Reports yesterday ate eggs with milk gravy, cheese sandwich and spaghetti for dinner.  "I didn't eat too much but it stayed down."  Patient reports that he is drinking 2 ensure per day.  "I have a friend that just brought me some more cases of ensure.     Medications: reviewed  Labs: K 3.1  Anthropometrics:   Noted weight today 187 pounds, increased from weight of 186 pounds on 1/19.     NUTRITION DIAGNOSIS: Inadequate oral intake continues   MALNUTRITION DIAGNOSIS: Severe malnutrition continues   INTERVENTION:   No family members are with patient this pm during infusion.  Encouraged intake of ensure/boost drinks for added calories and protein. Reminded patient of importance of making foods soft and liquid.   Patient continues to refuse PEG tube placement at this time.      MONITORING, EVALUATION, GOAL: Patient will consume adequate calories and protein to prevent further weight loss.   NEXT VISIT: Feb 8th during infusion  Congetta Odriscoll B. Zenia Resides, [place], [place] (pager)

## 2016-05-19 NOTE — Progress Notes (Signed)
Patient here for follow up. He states he is eating better N/v improved.

## 2016-05-20 ENCOUNTER — Ambulatory Visit: Payer: Medicare Other

## 2016-05-23 ENCOUNTER — Ambulatory Visit: Payer: Medicare Other

## 2016-05-23 ENCOUNTER — Other Ambulatory Visit: Payer: Self-pay | Admitting: *Deleted

## 2016-05-23 DIAGNOSIS — C155 Malignant neoplasm of lower third of esophagus: Secondary | ICD-10-CM

## 2016-05-23 MED ORDER — PROMETHAZINE HCL 25 MG RE SUPP: 25.0000 mg | Freq: Four times a day (QID) | RECTAL | 2 refills | Status: AC | PRN

## 2016-05-24 ENCOUNTER — Ambulatory Visit: Payer: Medicare Other

## 2016-05-24 ENCOUNTER — Ambulatory Visit
Admission: RE | Admit: 2016-05-24 | Discharge: 2016-05-24 | Disposition: A | Discharge status: Home / Self Care | Payer: Medicare Other | Source: Ambulatory Visit | Attending: Radiation Oncology | Admitting: Radiation Oncology

## 2016-05-24 DIAGNOSIS — C155 Malignant neoplasm of lower third of esophagus: Secondary | ICD-10-CM | POA: Diagnosis not present

## 2016-05-25 ENCOUNTER — Ambulatory Visit: Payer: Medicare Other

## 2016-05-25 NOTE — Progress Notes (Deleted)
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  INTERVAL HISTORY: Patient returns to clinic today for further evaluation and consideration of cycle 2 of weekly carboplatinum along with his concurrent XRT. He continues to have difficulty swallowing with nausea and vomiting, but states this has improved over the last week since initiating treatment. He continues to refuse PEG tube. He also continues to lose weight. He continues to have occasional falls. He has no neurologic complaints. He denies any shortness of breath or chest pain. He denies any constipation or diarrhea. He has no melena or hematochezia. He has no urinary complaints. Patient offers no further specific complaints today.  REVIEW OF SYSTEMS:   Review of Systems  Constitutional: Negative for fever, malaise/fatigue and weight loss.  Respiratory: Negative.  Negative for shortness of breath.   Cardiovascular: Negative for chest pain and leg swelling.  Gastrointestinal: Positive for heartburn, nausea and vomiting. Negative for abdominal pain, blood in stool and melena.  Genitourinary: Negative.   Musculoskeletal: Positive for joint pain.  Neurological: Negative.  Negative for weakness.  Endo/Heme/Allergies: Does not bruise/bleed easily.  Psychiatric/Behavioral: Negative.  The patient is not nervous/anxious.    As per HPI. Otherwise, a complete review of systems is negative.   PAST MEDICAL HISTORY:  Hypertension, sleep apnea, atrial fibrillation.  PAST SURGICAL HISTORY: Negative.  FAMILY HISTORY: Reviewed and unchanged. No report of malignancy or chronic disease.     ADVANCED  DIRECTIVES:    HEALTH MAINTENANCE: Social History  Substance Use Topics  . Smoking status: Current Some Day Smoker    Years: 62.00  . Smokeless tobacco: Current User    Types: Snuff  . Alcohol use No     No Known Allergies  Current Outpatient Prescriptions  Medication Sig Dispense Refill  . albuterol (PROVENTIL HFA;VENTOLIN HFA) 108 (90 Base) MCG/ACT inhaler Inhale 1-2 puffs into the lungs every 6 (six) hours as needed.     Marland Kitchen CARAFATE 1 GM/10ML suspension Take 10 mL (1 g total) by mouth Two (2) times a day.  0  . digoxin (LANOXIN) 0.125 MG tablet Take 0.125 mg by mouth daily.     Marland Kitchen diltiazem (CARDIZEM CD) 120 MG 24 hr capsule Take 120 mg by mouth daily.     . enalapril (VASOTEC) 20 MG tablet Take 20 mg by mouth daily.     . finasteride (PROSCAR) 5 MG tablet Take 5 mg by mouth daily.    . fluticasone (FLONASE) 50 MCG/ACT nasal spray ONE SPRAY IN EACH NOSTRIL AT NIGHT AS DIRECTED  11  . fluticasone (FLOVENT HFA) 220 MCG/ACT inhaler Inhale into the lungs.    . furosemide (LASIX) 20 MG tablet Take 20 mg by mouth.    . gabapentin (NEURONTIN) 300 MG capsule Take 300 mg by mouth 2 (two) times daily.     . metoprolol (LOPRESSOR) 50 MG tablet Take 50 mg by mouth 2 (two) times daily.     Marland Kitchen morphine (ROXANOL) 20 MG/ML concentrated solution Take 0.5 mLs (10 mg total) by mouth every 6 (six) hours as needed for severe pain. 60 mL 0  . omeprazole (PRILOSEC) 20 MG capsule Take 1 capsule (20 mg total) by mouth daily. 30 capsule 2  .  ondansetron (ZOFRAN) 4 MG tablet Take 1 tablet (4 mg total) by mouth every 8 (eight) hours as needed for nausea or vomiting. 10 tablet 0  . prochlorperazine (COMPAZINE) 10 MG tablet Take 1 tablet (10 mg total) by mouth every 6 (six) hours as needed for nausea or vomiting. 90 tablet 5  . promethazine (PHENERGAN) 25 MG suppository Place 1 suppository (25 mg total) rectally every 6 (six) hours as needed for nausea or vomiting. 20 suppository 2  . senna (SENOKOT) 8.6 MG TABS  tablet Take 1 tablet (8.6 mg total) by mouth 2 (two) times daily. 14 each 0  . tamsulosin (FLOMAX) 0.4 MG CAPS capsule Take 0.4 mg by mouth daily.  3  . warfarin (COUMADIN) 4 MG tablet Take 2 tablets by mouth with evening meal     No current facility-administered medications for this visit.    Facility-Administered Medications Ordered in Other Visits  Medication Dose Route Frequency Provider Last Rate Last Dose  . sodium chloride 0.9 % injection 10 mL  10 mL Intravenous PRN Jeremy Huger, MD   10 mL at 12/23/14 0945  . sodium chloride 0.9 % injection 10 mL  10 mL Intravenous PRN Jeremy Huger, MD   10 mL at 01/15/15 1512  . sodium chloride flush (NS) 0.9 % injection 10 mL  10 mL Intravenous PRN Jeremy Huger, MD   10 mL at 11/10/15 5852    OBJECTIVE: There were no vitals filed for this visit.   There is no height or weight on file to calculate BMI.    ECOG FS:2 - Symptomatic, <50% confined to bed  General: Well-developed, well-nourished, no acute distress. Eyes: anicteric sclera. HEENT: Oropharynx clear without exudate or erythema. No palpable lymphadenopathy. Lungs: Clear to auscultation bilaterally. Heart: Regular rate and rhythm. No rubs, murmurs, or gallops. Abdomen: Soft, nontender, nondistended. No organomegaly noted, normoactive bowel sounds. Musculoskeletal: No edema, cyanosis, or clubbing. Neuro: Alert, answering all questions appropriately. Cranial nerves grossly intact. Skin: No rashes or petechiae noted. Psych: Normal affect.   LAB RESULTS:  Lab Results  Component Value Date   NA 138 05/19/2016   K 3.1 (L) 05/19/2016   CL 105 05/19/2016   CO2 29 05/19/2016   GLUCOSE 110 (H) 05/19/2016   BUN 17 05/19/2016   CREATININE 0.88 05/19/2016   CALCIUM 8.3 (L) 05/19/2016   PROT 5.2 (L) 05/19/2016   ALBUMIN 2.9 (L) 05/19/2016   AST 23 05/19/2016   ALT 21 05/19/2016   ALKPHOS 74 05/19/2016   BILITOT 1.1 05/19/2016   GFRNONAA >60 05/19/2016   GFRAA >60  05/19/2016    Lab Results  Component Value Date   WBC 2.8 (L) 05/19/2016   NEUTROABS 1.9 05/19/2016   HGB 12.0 (L) 05/19/2016   HCT 35.0 (L) 05/19/2016   MCV 85.0 05/19/2016   PLT 117 (L) 05/19/2016     STUDIES: No results found.  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  PLAN:    1. 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 Endoscopy on February 26, 2016 also revealed residual adenocarcinoma on biopsy. Patient completed 4 cycles of single agent Taxol on December 15, 2015. Patient is not a surgical candidate. Patient was referred to radiation oncology and although unconventional, plan to give him additional XRT.  Because of this, Beryle Flock Has been discontinued. Patient's final treatment with XRT will be on June 06, 2016.  Proceed with cycle  2 of weekly concurrent carboplatinum and Taxol. Return to clinic in 1 week for consideration of cycle 2.  2. Hip fracture: Continued evaluation and treatment per orthopedics.  3. Anemia: Resolved. 4. Reflux: Continue Prilosec as prescribed. Continue with soft and liquid foods per PCP. Monitor.  5: Pain: Perscribed 71m/0.5ml Roxanol q 6 hours for pain. Educated on how to take liquid pain medication. Patient also received 2 mg IV morphine in clinic today. 6. Nutrition: Appreciate dietary input, but patient has declined feeding tube at this time.  7. Advanced directives: Patient was encouraged to fill out his advanced directives and discussed end-of-life issues with his family.   Patient expressed understanding and was in agreement with this plan. He also understands that He can call clinic at any time with any questions, concerns, or complaints.    TLloyd Huger MD 05/25/16 10:29 PM

## 2016-05-26 ENCOUNTER — Inpatient Hospital Stay: Payer: Medicare Other | Admitting: Oncology

## 2016-05-26 ENCOUNTER — Inpatient Hospital Stay: Payer: Medicare Other

## 2016-05-26 ENCOUNTER — Ambulatory Visit: Payer: Medicare Other

## 2016-05-26 NOTE — Progress Notes (Signed)
Nutrition   Patient did not show up for chemotherapy so was not able to complete nutrition follow-up during infusion.  Patient to reschedule.  Mackson Botz B. Zenia Resides, [place], [place] (pager)

## 2016-05-27 ENCOUNTER — Ambulatory Visit: Payer: Medicare Other

## 2016-05-27 ENCOUNTER — Ambulatory Visit
Admission: RE | Admit: 2016-05-27 | Discharge: 2016-05-27 | Disposition: A | Discharge status: Home / Self Care | Payer: Medicare Other | Source: Ambulatory Visit | Attending: Radiation Oncology | Admitting: Radiation Oncology

## 2016-05-27 ENCOUNTER — Telehealth: Payer: Self-pay | Admitting: *Deleted

## 2016-05-27 DIAGNOSIS — C155 Malignant neoplasm of lower third of esophagus: Secondary | ICD-10-CM | POA: Diagnosis not present

## 2016-05-27 NOTE — Telephone Encounter (Signed)
I'll need to talk to him next week, but the best solution would be hospice care.

## 2016-05-27 NOTE — Telephone Encounter (Signed)
He will be getting XRT every day next week, but he has no FU scheduled with Dr Grayland Ormond

## 2016-05-27 NOTE — Telephone Encounter (Signed)
Has numerous concerns regarding Rogen's judgements, he has moved in with his other son and his pain meds are disappearing. Also reports that he is getting pain med from Dr Grayland Ormond and Dr Joella Prince is giving him Hydrocodone. She is wondering if his cancer has gone to his brain. Not sure what to do, but she is very concerned that he is not being cared for and is unbathed. She has called [employer] for protective services and they recommended that she contacted all his doctors with concerns regarding his meds and care. Please advise

## 2016-05-27 NOTE — Telephone Encounter (Signed)
He missed his appt this past week and scheduled should have rescheduled him.

## 2016-05-27 NOTE — Telephone Encounter (Signed)
Please be sure to reschedule this appt that was No show  While he is here for XRT next week

## 2016-05-27 NOTE — Telephone Encounter (Signed)
Discussed this with [place] who states [name] has done all that be done at this point in time

## 2016-05-30 ENCOUNTER — Ambulatory Visit: Payer: Medicare Other

## 2016-05-30 ENCOUNTER — Telehealth: Payer: Self-pay | Admitting: Oncology

## 2016-05-30 ENCOUNTER — Ambulatory Visit
Admission: RE | Admit: 2016-05-30 | Discharge: 2016-05-30 | Disposition: A | Discharge status: Home / Self Care | Payer: Medicare Other | Source: Ambulatory Visit | Attending: Radiation Oncology | Admitting: Radiation Oncology

## 2016-05-30 DIAGNOSIS — C155 Malignant neoplasm of lower third of esophagus: Secondary | ICD-10-CM | POA: Diagnosis not present

## 2016-05-30 NOTE — Telephone Encounter (Signed)
Message left on VM of son [name]) to return call to confirm lab\md\treatment app on 2\13\18.

## 2016-05-30 NOTE — Progress Notes (Signed)
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  INTERVAL HISTORY: Patient returns to clinic today for further evaluation and consideration of cycle 3 of weekly carboplatinum and Taxol along with his concurrent XRT. His performance status is declining. Though he states he is able to swallow better and has decreased nausea and vomiting. He continues to refuse PEG tube. He also continues to lose weight. He continues to have occasional falls. He has no neurologic complaints. He denies any shortness of breath or chest pain. He denies any constipation or diarrhea. He has no melena or hematochezia. He has no urinary complaints. Patient offers no further specific complaints today.  REVIEW OF SYSTEMS:   Review of Systems  Constitutional: Negative for fever, malaise/fatigue and weight loss.  Respiratory: Negative.  Negative for shortness of breath.   Cardiovascular: Negative for chest pain and leg swelling.  Gastrointestinal: Positive for heartburn, nausea and vomiting. Negative for abdominal pain, blood in stool and melena.  Genitourinary: Negative.   Musculoskeletal: Positive for joint pain.  Neurological: Negative.  Negative for weakness.  Endo/Heme/Allergies: Does not bruise/bleed easily.  Psychiatric/Behavioral: Negative.  The patient is not nervous/anxious.    As per HPI. Otherwise, a complete review of systems is negative.   PAST MEDICAL HISTORY:  Hypertension, sleep apnea, atrial fibrillation.  PAST SURGICAL HISTORY: Negative.  FAMILY HISTORY: Reviewed and unchanged. No report of malignancy or chronic disease.     ADVANCED DIRECTIVES:     HEALTH MAINTENANCE: Social History  Substance Use Topics  . Smoking status: Current Some Day Smoker    Years: 62.00  . Smokeless tobacco: Current User    Types: Snuff  . Alcohol use No     No Known Allergies  Current Outpatient Prescriptions  Medication Sig Dispense Refill  . albuterol (PROVENTIL HFA;VENTOLIN HFA) 108 (90 Base) MCG/ACT inhaler Inhale 1-2 puffs into the lungs every 6 (six) hours as needed.     Marland Kitchen CARAFATE 1 GM/10ML suspension Take 10 mL (1 g total) by mouth Two (2) times a day.  0  . digoxin (LANOXIN) 0.125 MG tablet Take 0.125 mg by mouth daily.     Marland Kitchen diltiazem (CARDIZEM CD) 120 MG 24 hr capsule Take 120 mg by mouth daily.     . enalapril (VASOTEC) 20 MG tablet Take 20 mg by mouth daily.     . finasteride (PROSCAR) 5 MG tablet Take 5 mg by mouth daily.    . fluticasone (FLONASE) 50 MCG/ACT nasal spray ONE SPRAY IN EACH NOSTRIL AT NIGHT AS DIRECTED  11  . fluticasone (FLOVENT HFA) 220 MCG/ACT inhaler Inhale into the lungs.    . furosemide (LASIX) 20 MG tablet Take 20 mg by mouth.    . gabapentin (NEURONTIN) 300 MG capsule Take 300 mg by mouth 2 (two) times daily.     Marland Kitchen HYDROcodone-acetaminophen (NORCO) 10-325 MG tablet     . metoprolol (LOPRESSOR) 50 MG tablet Take 50 mg by mouth 2 (two) times daily.     Marland Kitchen morphine (ROXANOL) 20 MG/ML concentrated solution Take 0.5 mLs (10 mg total) by mouth every 6 (six) hours as needed for severe pain. 60 mL 0  . omeprazole (PRILOSEC) 20 MG capsule Take 1 capsule (  20 mg total) by mouth daily. 30 capsule 2  . ondansetron (ZOFRAN) 4 MG tablet Take 1 tablet (4 mg total) by mouth every 8 (eight) hours as needed for nausea or vomiting. 10 tablet 0  . prochlorperazine (COMPAZINE) 10 MG tablet Take 1 tablet (10 mg total) by mouth every 6 (six) hours as needed for nausea or vomiting. 90 tablet 5  . promethazine (PHENERGAN) 25 MG suppository Place 1 suppository (25 mg total) rectally every 6 (six) hours as needed for nausea or vomiting. 20  suppository 2  . senna (SENOKOT) 8.6 MG TABS tablet Take 1 tablet (8.6 mg total) by mouth 2 (two) times daily. 14 each 0  . tamsulosin (FLOMAX) 0.4 MG CAPS capsule Take 0.4 mg by mouth daily.  3  . warfarin (COUMADIN) 4 MG tablet Take 2 tablets by mouth with evening meal     No current facility-administered medications for this visit.    Facility-Administered Medications Ordered in Other Visits  Medication Dose Route Frequency Provider Last Rate Last Dose  . sodium chloride 0.9 % injection 10 mL  10 mL Intravenous PRN Lloyd Huger, MD   10 mL at 12/23/14 0945  . sodium chloride 0.9 % injection 10 mL  10 mL Intravenous PRN Lloyd Huger, MD   10 mL at 01/15/15 1512  . sodium chloride flush (NS) 0.9 % injection 10 mL  10 mL Intravenous PRN Lloyd Huger, MD   10 mL at 11/10/15 0939    OBJECTIVE: Vitals:   05/31/16 1121  BP: (!) 101/55  Pulse: (!) 51  Resp: 18  Temp: (!) 96.4 F (35.8 C)     Body mass index is 30.94 kg/m.    ECOG FS:2 - Symptomatic, <50% confined to bed  General: Well-developed, well-nourished, no acute distress. Eyes: anicteric sclera. HEENT: Oropharynx clear without exudate or erythema. No palpable lymphadenopathy. Lungs: Clear to auscultation bilaterally. Heart: Regular rate and rhythm. No rubs, murmurs, or gallops. Abdomen: Soft, nontender, nondistended. No organomegaly noted, normoactive bowel sounds. Musculoskeletal: No edema, cyanosis, or clubbing. Neuro: Alert, answering all questions appropriately. Cranial nerves grossly intact. Skin: No rashes or petechiae noted. Psych: Normal affect.   LAB RESULTS:  Lab Results  Component Value Date   NA 135 05/31/2016   K 3.4 (L) 05/31/2016   CL 103 05/31/2016   CO2 27 05/31/2016   GLUCOSE 122 (H) 05/31/2016   BUN 12 05/31/2016   CREATININE 0.74 05/31/2016   CALCIUM 8.0 (L) 05/31/2016   PROT 5.6 (L) 05/31/2016   ALBUMIN 2.6 (L) 05/31/2016   AST 21 05/31/2016   ALT 11 (L) 05/31/2016    ALKPHOS 68 05/31/2016   BILITOT 0.8 05/31/2016   GFRNONAA >60 05/31/2016   GFRAA >60 05/31/2016    Lab Results  Component Value Date   WBC 2.6 (L) 05/31/2016   NEUTROABS 1.6 05/31/2016   HGB 11.0 (L) 05/31/2016   HCT 31.8 (L) 05/31/2016   MCV 85.6 05/31/2016   PLT 149 (L) 05/31/2016     STUDIES: No results found.  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  PLAN:    1. 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 Endoscopy on February 26, 2016 also revealed residual adenocarcinoma on biopsy. Patient completed 4 cycles of single agent Taxol on December 15, 2015. Patient is not a surgical candidate. Patient was referred to radiation oncology and although unconventional, plan to give him additional  Maryruth Bun will conclude on June 13, 2016.  Because of this, Beryle Flock Has been discontinued. Proceed with cycle 3 of weekly concurrent carboplatinum and Taxol. Return to clinic in 1 week for consideration of cycle 4.  2. Hip fracture: Continued evaluation and treatment per orthopedics.  3. Anemia: Resolved. 4. Reflux: Continue Prilosec as prescribed. Continue with soft and liquid foods per PCP. Monitor.  5: Pain: Continue 92m/0.5ml Roxanol q 6 hours as needed. 6. Nutrition: Appreciate dietary input, but patient has declined feeding tube at this time.  7. Advanced directives: Patient was encouraged to fill out his advanced directives and discussed end-of-life issues with his family.   Patient expressed understanding and was in agreement with this plan. He also understands that He can call clinic at any time with any questions, concerns, or complaints.    TLloyd Huger MD 06/02/16 6:24 PM

## 2016-05-31 ENCOUNTER — Ambulatory Visit: Payer: Medicare Other

## 2016-05-31 ENCOUNTER — Inpatient Hospital Stay: Payer: Medicare Other

## 2016-05-31 ENCOUNTER — Inpatient Hospital Stay (HOSPITAL_BASED_OUTPATIENT_CLINIC_OR_DEPARTMENT_OTHER): Payer: Medicare Other | Admitting: Oncology

## 2016-05-31 ENCOUNTER — Ambulatory Visit
Admission: RE | Admit: 2016-05-31 | Discharge: 2016-05-31 | Disposition: A | Discharge status: Home / Self Care | Payer: Medicare Other | Source: Ambulatory Visit | Attending: Radiation Oncology | Admitting: Radiation Oncology

## 2016-05-31 VITALS — BP 101/55 | HR 51 | Temp 96.4°F | Resp 18 | Wt 191.7 lb

## 2016-05-31 DIAGNOSIS — Z8781 Personal history of (healed) traumatic fracture: Secondary | ICD-10-CM

## 2016-05-31 DIAGNOSIS — M2550 Pain in unspecified joint: Secondary | ICD-10-CM

## 2016-05-31 DIAGNOSIS — R112 Nausea with vomiting, unspecified: Secondary | ICD-10-CM

## 2016-05-31 DIAGNOSIS — F17210 Nicotine dependence, cigarettes, uncomplicated: Secondary | ICD-10-CM

## 2016-05-31 DIAGNOSIS — I10 Essential (primary) hypertension: Secondary | ICD-10-CM

## 2016-05-31 DIAGNOSIS — C155 Malignant neoplasm of lower third of esophagus: Secondary | ICD-10-CM

## 2016-05-31 DIAGNOSIS — K219 Gastro-esophageal reflux disease without esophagitis: Secondary | ICD-10-CM | POA: Diagnosis not present

## 2016-05-31 DIAGNOSIS — I4891 Unspecified atrial fibrillation: Secondary | ICD-10-CM

## 2016-05-31 DIAGNOSIS — Z7901 Long term (current) use of anticoagulants: Secondary | ICD-10-CM

## 2016-05-31 DIAGNOSIS — G4730 Sleep apnea, unspecified: Secondary | ICD-10-CM

## 2016-05-31 DIAGNOSIS — Z79899 Other long term (current) drug therapy: Secondary | ICD-10-CM

## 2016-05-31 DIAGNOSIS — R1310 Dysphagia, unspecified: Secondary | ICD-10-CM

## 2016-05-31 LAB — CBC WITH DIFFERENTIAL/PLATELET
Basophils Absolute: 0 10*3/uL (ref 0–0.1)
Basophils Relative: 1 %
Eosinophils Absolute: 0 10*3/uL (ref 0–0.7)
Eosinophils Relative: 1 %
HEMATOCRIT: 31.8 % — AB (ref 40.0–52.0)
Hemoglobin: 11 g/dL — ABNORMAL LOW (ref 13.0–18.0)
LYMPHS ABS: 0.5 10*3/uL — AB (ref 1.0–3.6)
LYMPHS PCT: 20 %
MCH: 29.6 pg (ref 26.0–34.0)
MCHC: 34.6 g/dL (ref 32.0–36.0)
MCV: 85.6 fL (ref 80.0–100.0)
MONO ABS: 0.4 10*3/uL (ref 0.2–1.0)
MONOS PCT: 16 %
NEUTROS ABS: 1.6 10*3/uL (ref 1.4–6.5)
Neutrophils Relative %: 62 %
Platelets: 149 10*3/uL — ABNORMAL LOW (ref 150–440)
RBC: 3.71 MIL/uL — ABNORMAL LOW (ref 4.40–5.90)
RDW: 17.2 % — AB (ref 11.5–14.5)
WBC: 2.6 10*3/uL — ABNORMAL LOW (ref 3.8–10.6)

## 2016-05-31 LAB — COMPREHENSIVE METABOLIC PANEL
ALT: 11 U/L — ABNORMAL LOW (ref 17–63)
ANION GAP: 5 (ref 5–15)
AST: 21 U/L (ref 15–41)
Albumin: 2.6 g/dL — ABNORMAL LOW (ref 3.5–5.0)
Alkaline Phosphatase: 68 U/L (ref 38–126)
BILIRUBIN TOTAL: 0.8 mg/dL (ref 0.3–1.2)
BUN: 12 mg/dL (ref 6–20)
CALCIUM: 8 mg/dL — AB (ref 8.9–10.3)
CO2: 27 mmol/L (ref 22–32)
Chloride: 103 mmol/L (ref 101–111)
Creatinine, Ser: 0.74 mg/dL (ref 0.61–1.24)
GFR calc Af Amer: >60 mL/min (ref >60)
Glucose, Bld: 122 mg/dL — ABNORMAL HIGH (ref 65–99)
POTASSIUM: 3.4 mmol/L — AB (ref 3.5–5.1)
Sodium: 135 mmol/L (ref 135–145)
TOTAL PROTEIN: 5.6 g/dL — AB (ref 6.5–8.1)

## 2016-05-31 MED ORDER — HEPARIN SOD (PORK) LOCK FLUSH 100 UNIT/ML IV SOLN: 500.0000 [IU] | Freq: Once | INTRAVENOUS | Status: DC | PRN

## 2016-05-31 MED ORDER — FAMOTIDINE IN NACL 20-0.9 MG/50ML-% IV SOLN
20.0000 mg | Freq: Once | INTRAVENOUS | Status: AC
  Administered 2016-05-31: 20 mg via INTRAVENOUS
  Filled 2016-05-31: qty 50

## 2016-05-31 MED ORDER — DIPHENHYDRAMINE HCL 50 MG/ML IJ SOLN
25.0000 mg | Freq: Once | INTRAMUSCULAR | Status: AC
  Administered 2016-05-31: 25 mg via INTRAVENOUS
  Filled 2016-05-31: qty 1

## 2016-05-31 MED ORDER — SODIUM CHLORIDE 0.9% FLUSH
10.0000 mL | INTRAVENOUS | Status: DC | PRN
  Administered 2016-05-31: 10 mL via INTRAVENOUS
  Filled 2016-05-31: qty 10

## 2016-05-31 MED ORDER — PALONOSETRON HCL INJECTION 0.25 MG/5ML
0.2500 mg | Freq: Once | INTRAVENOUS | Status: AC
  Administered 2016-05-31: 0.25 mg via INTRAVENOUS
  Filled 2016-05-31: qty 5

## 2016-05-31 MED ORDER — DEXTROSE 5 % IV SOLN
45.0000 mg/m2 | Freq: Once | INTRAVENOUS | Status: AC
  Administered 2016-05-31: 90 mg via INTRAVENOUS
  Filled 2016-05-31: qty 15

## 2016-05-31 MED ORDER — SODIUM CHLORIDE 0.9 % IV SOLN: 10.0000 mg | Freq: Once | INTRAVENOUS | Status: DC

## 2016-05-31 MED ORDER — DEXAMETHASONE SODIUM PHOSPHATE 10 MG/ML IJ SOLN
10.0000 mg | Freq: Once | INTRAMUSCULAR | Status: AC
  Administered 2016-05-31: 10 mg via INTRAVENOUS
  Filled 2016-05-31: qty 1

## 2016-05-31 MED ORDER — SODIUM CHLORIDE 0.9 % IV SOLN
210.0000 mg | Freq: Once | INTRAVENOUS | Status: AC
  Administered 2016-05-31: 210 mg via INTRAVENOUS
  Filled 2016-05-31: qty 21

## 2016-05-31 MED ORDER — SODIUM CHLORIDE 0.9 % IV SOLN
Freq: Once | INTRAVENOUS | Status: AC
  Administered 2016-05-31: 12:00:00 via INTRAVENOUS
  Filled 2016-05-31: qty 1000

## 2016-05-31 MED ORDER — MORPHINE SULFATE (CONCENTRATE) 20 MG/ML PO SOLN
10.0000 mg | Freq: Four times a day (QID) | ORAL | 0 refills | Status: DC | PRN
Start: 2016-05-31 — End: 2016-06-09

## 2016-05-31 MED ORDER — HEPARIN SOD (PORK) LOCK FLUSH 100 UNIT/ML IV SOLN
500.0000 [IU] | Freq: Once | INTRAVENOUS | Status: AC
  Administered 2016-05-31: 500 [IU] via INTRAVENOUS
  Filled 2016-05-31: qty 5

## 2016-06-01 ENCOUNTER — Ambulatory Visit: Payer: Medicare Other

## 2016-06-02 ENCOUNTER — Ambulatory Visit: Payer: Medicare Other

## 2016-06-03 ENCOUNTER — Ambulatory Visit: Payer: Medicare Other

## 2016-06-03 ENCOUNTER — Telehealth: Payer: Self-pay | Admitting: *Deleted

## 2016-06-03 NOTE — Telephone Encounter (Signed)
Called and advised Jeremy Gordon that per Dr Grayland Ormond he is to go to the ER. SHe said she will call him and tell the daughter in law to take him

## 2016-06-03 NOTE — Telephone Encounter (Signed)
Please send pt to ER.

## 2016-06-03 NOTE — Telephone Encounter (Signed)
Called to report that he had chills after his last treatment and that he had fallen out of bed and was "out of it" for 4 days. Do not know if he had fever or not, his son is not taking care of him. She is asking what needs to be done for him. Please advise.

## 2016-06-06 ENCOUNTER — Ambulatory Visit: Payer: Medicare Other

## 2016-06-06 ENCOUNTER — Telehealth: Payer: Self-pay | Admitting: *Deleted

## 2016-06-06 NOTE — Telephone Encounter (Signed)
Called to report that Adal refused to go to ER Friday stating he was feeling better, but also that he was not coming for XRT today either. His wife is now back with the other son for care.

## 2016-06-06 NOTE — Progress Notes (Deleted)
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  INTERVAL HISTORY: Patient returns to clinic today for further evaluation and consideration of cycle 3 of weekly carboplatinum and Taxol along with his concurrent XRT. His performance status is declining. Though he states he is able to swallow better and has decreased nausea and vomiting. He continues to refuse PEG tube. He also continues to lose weight. He continues to have occasional falls. He has no neurologic complaints. He denies any shortness of breath or chest pain. He denies any constipation or diarrhea. He has no melena or hematochezia. He has no urinary complaints. Patient offers no further specific complaints today.  REVIEW OF SYSTEMS:   Review of Systems  Constitutional: Negative for fever, malaise/fatigue and weight loss.  Respiratory: Negative.  Negative for shortness of breath.   Cardiovascular: Negative for chest pain and leg swelling.  Gastrointestinal: Positive for heartburn, nausea and vomiting. Negative for abdominal pain, blood in stool and melena.  Genitourinary: Negative.   Musculoskeletal: Positive for joint pain.  Neurological: Negative.  Negative for weakness.  Endo/Heme/Allergies: Does not bruise/bleed easily.  Psychiatric/Behavioral: Negative.  The patient is not nervous/anxious.    As per HPI. Otherwise, a complete review of systems is negative.   PAST MEDICAL HISTORY:  Hypertension, sleep apnea, atrial fibrillation.  PAST SURGICAL HISTORY: Negative.  FAMILY HISTORY: Reviewed and unchanged. No report of malignancy or chronic disease.     ADVANCED DIRECTIVES:     HEALTH MAINTENANCE: Social History  Substance Use Topics  . Smoking status: Current Some Day Smoker    Years: 62.00  . Smokeless tobacco: Current User    Types: Snuff  . Alcohol use No     No Known Allergies  Current Outpatient Prescriptions  Medication Sig Dispense Refill  . albuterol (PROVENTIL HFA;VENTOLIN HFA) 108 (90 Base) MCG/ACT inhaler Inhale 1-2 puffs into the lungs every 6 (six) hours as needed.     Marland Kitchen CARAFATE 1 GM/10ML suspension Take 10 mL (1 g total) by mouth Two (2) times a day.  0  . digoxin (LANOXIN) 0.125 MG tablet Take 0.125 mg by mouth daily.     Marland Kitchen diltiazem (CARDIZEM CD) 120 MG 24 hr capsule Take 120 mg by mouth daily.     . enalapril (VASOTEC) 20 MG tablet Take 20 mg by mouth daily.     . finasteride (PROSCAR) 5 MG tablet Take 5 mg by mouth daily.    . fluticasone (FLONASE) 50 MCG/ACT nasal spray ONE SPRAY IN EACH NOSTRIL AT NIGHT AS DIRECTED  11  . fluticasone (FLOVENT HFA) 220 MCG/ACT inhaler Inhale into the lungs.    . furosemide (LASIX) 20 MG tablet Take 20 mg by mouth.    . gabapentin (NEURONTIN) 300 MG capsule Take 300 mg by mouth 2 (two) times daily.     Marland Kitchen HYDROcodone-acetaminophen (NORCO) 10-325 MG tablet     . metoprolol (LOPRESSOR) 50 MG tablet Take 50 mg by mouth 2 (two) times daily.     Marland Kitchen morphine (ROXANOL) 20 MG/ML concentrated solution Take 0.5 mLs (10 mg total) by mouth every 6 (six) hours as needed for severe pain. 60 mL 0  . omeprazole (PRILOSEC) 20 MG capsule Take 1 capsule (  20 mg total) by mouth daily. 30 capsule 2  . ondansetron (ZOFRAN) 4 MG tablet Take 1 tablet (4 mg total) by mouth every 8 (eight) hours as needed for nausea or vomiting. 10 tablet 0  . prochlorperazine (COMPAZINE) 10 MG tablet Take 1 tablet (10 mg total) by mouth every 6 (six) hours as needed for nausea or vomiting. 90 tablet 5  . promethazine (PHENERGAN) 25 MG suppository Place 1 suppository (25 mg total) rectally every 6 (six) hours as needed for nausea or vomiting. 20  suppository 2  . senna (SENOKOT) 8.6 MG TABS tablet Take 1 tablet (8.6 mg total) by mouth 2 (two) times daily. 14 each 0  . tamsulosin (FLOMAX) 0.4 MG CAPS capsule Take 0.4 mg by mouth daily.  3  . warfarin (COUMADIN) 4 MG tablet Take 2 tablets by mouth with evening meal     No current facility-administered medications for this visit.    Facility-Administered Medications Ordered in Other Visits  Medication Dose Route Frequency Provider Last Rate Last Dose  . sodium chloride 0.9 % injection 10 mL  10 mL Intravenous PRN Lloyd Huger, MD   10 mL at 12/23/14 0945  . sodium chloride 0.9 % injection 10 mL  10 mL Intravenous PRN Lloyd Huger, MD   10 mL at 01/15/15 1512  . sodium chloride flush (NS) 0.9 % injection 10 mL  10 mL Intravenous PRN Lloyd Huger, MD   10 mL at 11/10/15 1093    OBJECTIVE: There were no vitals filed for this visit.   There is no height or weight on file to calculate BMI.    ECOG FS:2 - Symptomatic, <50% confined to bed  General: Well-developed, well-nourished, no acute distress. Eyes: anicteric sclera. HEENT: Oropharynx clear without exudate or erythema. No palpable lymphadenopathy. Lungs: Clear to auscultation bilaterally. Heart: Regular rate and rhythm. No rubs, murmurs, or gallops. Abdomen: Soft, nontender, nondistended. No organomegaly noted, normoactive bowel sounds. Musculoskeletal: No edema, cyanosis, or clubbing. Neuro: Alert, answering all questions appropriately. Cranial nerves grossly intact. Skin: No rashes or petechiae noted. Psych: Normal affect.   LAB RESULTS:  Lab Results  Component Value Date   NA 135 05/31/2016   K 3.4 (L) 05/31/2016   CL 103 05/31/2016   CO2 27 05/31/2016   GLUCOSE 122 (H) 05/31/2016   BUN 12 05/31/2016   CREATININE 0.74 05/31/2016   CALCIUM 8.0 (L) 05/31/2016   PROT 5.6 (L) 05/31/2016   ALBUMIN 2.6 (L) 05/31/2016   AST 21 05/31/2016   ALT 11 (L) 05/31/2016   ALKPHOS 68 05/31/2016   BILITOT 0.8  05/31/2016   GFRNONAA >60 05/31/2016   GFRAA >60 05/31/2016    Lab Results  Component Value Date   WBC 2.6 (L) 05/31/2016   NEUTROABS 1.6 05/31/2016   HGB 11.0 (L) 05/31/2016   HCT 31.8 (L) 05/31/2016   MCV 85.6 05/31/2016   PLT 149 (L) 05/31/2016     STUDIES: No results found.  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  PLAN:    1. 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 Endoscopy on February 26, 2016 also revealed residual adenocarcinoma on biopsy. Patient completed 4 cycles of single agent Taxol on December 15, 2015. Patient is not a surgical candidate. Patient was referred to radiation oncology and although unconventional, plan to give him additional Maryruth Bun will conclude on June 13, 2016.  Because of this, [place]  Has been discontinued. Proceed with cycle 3 of weekly concurrent carboplatinum and Taxol. Return to clinic in 1 week for consideration of cycle 4.  2. Hip fracture: Continued evaluation and treatment per orthopedics.  3. Anemia: Resolved. 4. Reflux: Continue Prilosec as prescribed. Continue with soft and liquid foods per PCP. Monitor.  5: Pain: Continue 108m/0.5ml Roxanol q 6 hours as needed. 6. Nutrition: Appreciate dietary input, but patient has declined feeding tube at this time.  7. Advanced directives: Patient was encouraged to fill out his advanced directives and discussed end-of-life issues with his family.   Patient expressed understanding and was in agreement with this plan. He also understands that He can call clinic at any time with any questions, concerns, or complaints.    TLloyd Huger MD 06/06/16 5:26 PM

## 2016-06-07 ENCOUNTER — Encounter: Payer: Self-pay | Admitting: Intensive Care

## 2016-06-07 ENCOUNTER — Inpatient Hospital Stay: Payer: Medicare Other

## 2016-06-07 ENCOUNTER — Observation Stay
Admission: EM | Admit: 2016-06-07 | Discharge: 2016-06-09 | Disposition: A | Discharge status: Skilled Nursing Facility | Payer: Medicare Other | Attending: Internal Medicine | Admitting: Internal Medicine

## 2016-06-07 ENCOUNTER — Ambulatory Visit: Payer: Medicare Other

## 2016-06-07 ENCOUNTER — Inpatient Hospital Stay: Payer: Medicare Other | Admitting: Oncology

## 2016-06-07 ENCOUNTER — Emergency Department: Payer: Medicare Other

## 2016-06-07 DIAGNOSIS — R531 Weakness: Secondary | ICD-10-CM | POA: Diagnosis not present

## 2016-06-07 DIAGNOSIS — Z79899 Other long term (current) drug therapy: Secondary | ICD-10-CM | POA: Diagnosis not present

## 2016-06-07 DIAGNOSIS — I10 Essential (primary) hypertension: Secondary | ICD-10-CM | POA: Insufficient documentation

## 2016-06-07 DIAGNOSIS — C155 Malignant neoplasm of lower third of esophagus: Secondary | ICD-10-CM

## 2016-06-07 DIAGNOSIS — Z7901 Long term (current) use of anticoagulants: Secondary | ICD-10-CM | POA: Insufficient documentation

## 2016-06-07 DIAGNOSIS — J441 Chronic obstructive pulmonary disease with (acute) exacerbation: Secondary | ICD-10-CM | POA: Insufficient documentation

## 2016-06-07 DIAGNOSIS — Z7951 Long term (current) use of inhaled steroids: Secondary | ICD-10-CM | POA: Insufficient documentation

## 2016-06-07 DIAGNOSIS — Z8501 Personal history of malignant neoplasm of esophagus: Secondary | ICD-10-CM | POA: Insufficient documentation

## 2016-06-07 DIAGNOSIS — Z9221 Personal history of antineoplastic chemotherapy: Secondary | ICD-10-CM | POA: Diagnosis not present

## 2016-06-07 DIAGNOSIS — M79604 Pain in right leg: Secondary | ICD-10-CM | POA: Diagnosis not present

## 2016-06-07 DIAGNOSIS — G4730 Sleep apnea, unspecified: Secondary | ICD-10-CM | POA: Diagnosis not present

## 2016-06-07 DIAGNOSIS — M1990 Unspecified osteoarthritis, unspecified site: Secondary | ICD-10-CM | POA: Insufficient documentation

## 2016-06-07 DIAGNOSIS — Z8249 Family history of ischemic heart disease and other diseases of the circulatory system: Secondary | ICD-10-CM | POA: Insufficient documentation

## 2016-06-07 DIAGNOSIS — W06XXXA Fall from bed, initial encounter: Secondary | ICD-10-CM | POA: Diagnosis not present

## 2016-06-07 DIAGNOSIS — D61818 Other pancytopenia: Secondary | ICD-10-CM | POA: Diagnosis not present

## 2016-06-07 DIAGNOSIS — R52 Pain, unspecified: Secondary | ICD-10-CM

## 2016-06-07 DIAGNOSIS — N400 Enlarged prostate without lower urinary tract symptoms: Secondary | ICD-10-CM | POA: Insufficient documentation

## 2016-06-07 DIAGNOSIS — Z79891 Long term (current) use of opiate analgesic: Secondary | ICD-10-CM | POA: Diagnosis not present

## 2016-06-07 DIAGNOSIS — I481 Persistent atrial fibrillation: Secondary | ICD-10-CM | POA: Insufficient documentation

## 2016-06-07 DIAGNOSIS — I482 Chronic atrial fibrillation: Secondary | ICD-10-CM | POA: Insufficient documentation

## 2016-06-07 DIAGNOSIS — R0602 Shortness of breath: Secondary | ICD-10-CM

## 2016-06-07 DIAGNOSIS — K219 Gastro-esophageal reflux disease without esophagitis: Secondary | ICD-10-CM | POA: Diagnosis not present

## 2016-06-07 DIAGNOSIS — F17210 Nicotine dependence, cigarettes, uncomplicated: Secondary | ICD-10-CM | POA: Diagnosis not present

## 2016-06-07 DIAGNOSIS — I4891 Unspecified atrial fibrillation: Secondary | ICD-10-CM

## 2016-06-07 DIAGNOSIS — R112 Nausea with vomiting, unspecified: Secondary | ICD-10-CM

## 2016-06-07 DIAGNOSIS — J189 Pneumonia, unspecified organism: Secondary | ICD-10-CM

## 2016-06-07 LAB — COMPREHENSIVE METABOLIC PANEL
ALK PHOS: 72 U/L (ref 38–126)
ALT: 13 U/L — ABNORMAL LOW (ref 17–63)
AST: 20 U/L (ref 15–41)
Albumin: 2.7 g/dL — ABNORMAL LOW (ref 3.5–5.0)
Anion gap: 6 (ref 5–15)
BUN: 9 mg/dL (ref 6–20)
CALCIUM: 8 mg/dL — AB (ref 8.9–10.3)
CO2: 28 mmol/L (ref 22–32)
Chloride: 99 mmol/L — ABNORMAL LOW (ref 101–111)
Creatinine, Ser: 0.61 mg/dL (ref 0.61–1.24)
GFR calc Af Amer: >60 mL/min (ref >60)
GFR calc non Af Amer: >60 mL/min (ref >60)
GLUCOSE: 91 mg/dL (ref 65–99)
Potassium: 3.3 mmol/L — ABNORMAL LOW (ref 3.5–5.1)
SODIUM: 133 mmol/L — AB (ref 135–145)
Total Bilirubin: 1.1 mg/dL (ref 0.3–1.2)
Total Protein: 5.4 g/dL — ABNORMAL LOW (ref 6.5–8.1)

## 2016-06-07 LAB — BLOOD GAS, VENOUS
ACID-BASE EXCESS: 6.4 mmol/L — AB (ref 0.0–2.0)
BICARBONATE: 30.5 mmol/L — AB (ref 20.0–28.0)
FIO2: 0.21
PCO2 VEN: 41 mmHg — AB (ref 44.0–60.0)
PH VEN: 7.48 — AB (ref 7.250–7.430)
Patient temperature: 37

## 2016-06-07 LAB — URINALYSIS, ROUTINE W REFLEX MICROSCOPIC
Bilirubin Urine: NEGATIVE
GLUCOSE, UA: NEGATIVE mg/dL
HGB URINE DIPSTICK: NEGATIVE
Ketones, ur: 5 mg/dL — AB
Leukocytes, UA: NEGATIVE
Nitrite: NEGATIVE
PH: 8 (ref 5.0–8.0)
Protein, ur: NEGATIVE mg/dL
Specific Gravity, Urine: 1.009 (ref 1.005–1.030)

## 2016-06-07 LAB — INFLUENZA PANEL BY PCR (TYPE A & B)
Influenza A By PCR: NEGATIVE
Influenza B By PCR: NEGATIVE

## 2016-06-07 LAB — CBC
HEMATOCRIT: 31.6 % — AB (ref 40.0–52.0)
HEMOGLOBIN: 10.7 g/dL — AB (ref 13.0–18.0)
MCH: 29 pg (ref 26.0–34.0)
MCHC: 34 g/dL (ref 32.0–36.0)
MCV: 85.2 fL (ref 80.0–100.0)
Platelets: 95 10*3/uL — ABNORMAL LOW (ref 150–440)
RBC: 3.7 MIL/uL — AB (ref 4.40–5.90)
RDW: 17.2 % — ABNORMAL HIGH (ref 11.5–14.5)
WBC: 2.3 10*3/uL — ABNORMAL LOW (ref 3.8–10.6)

## 2016-06-07 LAB — PROTIME-INR
INR: 1.87
Prothrombin Time: 21.8 seconds — ABNORMAL HIGH (ref 11.4–15.2)

## 2016-06-07 LAB — LACTIC ACID, PLASMA: LACTIC ACID, VENOUS: 0.9 mmol/L (ref 0.5–1.9)

## 2016-06-07 LAB — TROPONIN I: Troponin I: 0.03 ng/mL (ref <0.03)

## 2016-06-07 MED ORDER — GABAPENTIN 300 MG PO CAPS
300.0000 mg | ORAL_CAPSULE | Freq: Two times a day (BID) | ORAL | Status: DC
  Administered 2016-06-07 – 2016-06-09 (×4): 300 mg via ORAL
  Filled 2016-06-07 (×4): qty 1

## 2016-06-07 MED ORDER — FLUTICASONE PROPIONATE HFA 220 MCG/ACT IN AERO: 2.0000 | INHALATION_SPRAY | Freq: Two times a day (BID) | RESPIRATORY_TRACT | Status: DC

## 2016-06-07 MED ORDER — SODIUM CHLORIDE 0.9 % IV SOLN
INTRAVENOUS | Status: DC
  Administered 2016-06-08: 09:00:00 via INTRAVENOUS

## 2016-06-07 MED ORDER — MORPHINE SULFATE (CONCENTRATE) 20 MG/ML PO SOLN
10.0000 mg | Freq: Four times a day (QID) | ORAL | Status: DC | PRN
  Administered 2016-06-08: 10 mg via ORAL
  Filled 2016-06-07 (×3): qty 0.5

## 2016-06-07 MED ORDER — FINASTERIDE 5 MG PO TABS
5.0000 mg | ORAL_TABLET | Freq: Every day | ORAL | Status: DC
  Administered 2016-06-07 – 2016-06-09 (×3): 5 mg via ORAL
  Filled 2016-06-07 (×3): qty 1

## 2016-06-07 MED ORDER — VANCOMYCIN HCL 10 G IV SOLR
1750.0000 mg | Freq: Once | INTRAVENOUS | Status: AC
  Administered 2016-06-07: 1750 mg via INTRAVENOUS
  Filled 2016-06-07: qty 1750

## 2016-06-07 MED ORDER — SODIUM CHLORIDE 0.9 % IV SOLN
INTRAVENOUS | Status: DC
  Administered 2016-06-07: 18:00:00 via INTRAVENOUS

## 2016-06-07 MED ORDER — PANTOPRAZOLE SODIUM 40 MG PO TBEC
40.0000 mg | DELAYED_RELEASE_TABLET | Freq: Every day | ORAL | Status: DC
  Administered 2016-06-07 – 2016-06-09 (×3): 40 mg via ORAL
  Filled 2016-06-07 (×3): qty 1

## 2016-06-07 MED ORDER — PIPERACILLIN-TAZOBACTAM 3.375 G IVPB 30 MIN
3.3750 g | Freq: Once | INTRAVENOUS | Status: AC
  Administered 2016-06-07: 3.375 g via INTRAVENOUS
  Filled 2016-06-07: qty 50

## 2016-06-07 MED ORDER — LEVOFLOXACIN IN D5W 500 MG/100ML IV SOLN: 500.0000 mg | INTRAVENOUS | Status: DC

## 2016-06-07 MED ORDER — PROMETHAZINE HCL 25 MG RE SUPP
25.0000 mg | Freq: Four times a day (QID) | RECTAL | Status: DC | PRN
  Filled 2016-06-07: qty 1

## 2016-06-07 MED ORDER — SENNA 8.6 MG PO TABS
1.0000 | ORAL_TABLET | Freq: Two times a day (BID) | ORAL | Status: DC
  Administered 2016-06-07 – 2016-06-08 (×3): 8.6 mg via ORAL
  Filled 2016-06-07 (×4): qty 1

## 2016-06-07 MED ORDER — ENALAPRIL MALEATE 10 MG PO TABS
20.0000 mg | ORAL_TABLET | Freq: Every day | ORAL | Status: DC
  Administered 2016-06-07 – 2016-06-09 (×3): 20 mg via ORAL
  Filled 2016-06-07 (×3): qty 2

## 2016-06-07 MED ORDER — DILTIAZEM HCL 25 MG/5ML IV SOLN
10.0000 mg | Freq: Four times a day (QID) | INTRAVENOUS | Status: DC | PRN
  Administered 2016-06-07: 10 mg via INTRAVENOUS
  Filled 2016-06-07: qty 5

## 2016-06-07 MED ORDER — DOXYCYCLINE HYCLATE 100 MG IV SOLR
100.0000 mg | Freq: Two times a day (BID) | INTRAVENOUS | Status: DC
  Administered 2016-06-08: 100 mg via INTRAVENOUS
  Filled 2016-06-07 (×2): qty 100

## 2016-06-07 MED ORDER — BUDESONIDE 0.25 MG/2ML IN SUSP
0.2500 mg | Freq: Two times a day (BID) | RESPIRATORY_TRACT | Status: DC
Start: 2016-06-07 — End: 2016-06-08
  Administered 2016-06-07 – 2016-06-08 (×2): 0.25 mg via RESPIRATORY_TRACT
  Filled 2016-06-07 (×2): qty 2

## 2016-06-07 MED ORDER — ONDANSETRON HCL 4 MG PO TABS: 4.0000 mg | ORAL_TABLET | Freq: Four times a day (QID) | ORAL | Status: DC | PRN

## 2016-06-07 MED ORDER — DILTIAZEM HCL ER COATED BEADS 120 MG PO CP24
120.0000 mg | ORAL_CAPSULE | Freq: Every day | ORAL | Status: DC
  Administered 2016-06-07 – 2016-06-08 (×2): 120 mg via ORAL
  Filled 2016-06-07 (×2): qty 1

## 2016-06-07 MED ORDER — SODIUM CHLORIDE 0.9 % IV BOLUS (SEPSIS)
1000.0000 mL | Freq: Once | INTRAVENOUS | Status: AC
  Administered 2016-06-07: 1000 mL via INTRAVENOUS

## 2016-06-07 MED ORDER — WARFARIN - PHYSICIAN DOSING INPATIENT: Freq: Every day | Status: DC

## 2016-06-07 MED ORDER — ACETAMINOPHEN 650 MG RE SUPP: 650.0000 mg | Freq: Four times a day (QID) | RECTAL | Status: DC | PRN

## 2016-06-07 MED ORDER — DIGOXIN 125 MCG PO TABS
0.1250 mg | ORAL_TABLET | Freq: Every day | ORAL | Status: DC
  Administered 2016-06-07 – 2016-06-09 (×3): 0.125 mg via ORAL
  Filled 2016-06-07 (×3): qty 1

## 2016-06-07 MED ORDER — ACETAMINOPHEN 325 MG PO TABS: 650.0000 mg | ORAL_TABLET | Freq: Four times a day (QID) | ORAL | Status: DC | PRN

## 2016-06-07 MED ORDER — WARFARIN SODIUM 3 MG PO TABS
6.0000 mg | ORAL_TABLET | Freq: Once | ORAL | Status: AC
  Administered 2016-06-07: 6 mg via ORAL
  Filled 2016-06-07: qty 2

## 2016-06-07 MED ORDER — METHYLPREDNISOLONE SODIUM SUCC 125 MG IJ SOLR
60.0000 mg | Freq: Four times a day (QID) | INTRAMUSCULAR | Status: DC
  Administered 2016-06-07 – 2016-06-08 (×4): 60 mg via INTRAVENOUS
  Filled 2016-06-07 (×4): qty 2

## 2016-06-07 MED ORDER — IPRATROPIUM-ALBUTEROL 0.5-2.5 (3) MG/3ML IN SOLN
3.0000 mL | Freq: Four times a day (QID) | RESPIRATORY_TRACT | Status: DC
Start: 2016-06-07 — End: 2016-06-08
  Administered 2016-06-07 – 2016-06-08 (×3): 3 mL via RESPIRATORY_TRACT
  Filled 2016-06-07 (×3): qty 3

## 2016-06-07 MED ORDER — TAMSULOSIN HCL 0.4 MG PO CAPS
0.4000 mg | ORAL_CAPSULE | Freq: Every day | ORAL | Status: DC
  Administered 2016-06-07 – 2016-06-09 (×3): 0.4 mg via ORAL
  Filled 2016-06-07 (×3): qty 1

## 2016-06-07 MED ORDER — METOPROLOL TARTRATE 50 MG PO TABS
50.0000 mg | ORAL_TABLET | Freq: Two times a day (BID) | ORAL | Status: DC
  Administered 2016-06-07 – 2016-06-09 (×4): 50 mg via ORAL
  Filled 2016-06-07 (×4): qty 1

## 2016-06-07 MED ORDER — ALBUTEROL SULFATE (2.5 MG/3ML) 0.083% IN NEBU: 3.0000 mL | INHALATION_SOLUTION | Freq: Four times a day (QID) | RESPIRATORY_TRACT | Status: DC | PRN

## 2016-06-07 MED ORDER — FLUTICASONE PROPIONATE 50 MCG/ACT NA SUSP
2.0000 | Freq: Every day | NASAL | Status: DC
  Administered 2016-06-08: 2 via NASAL
  Filled 2016-06-07: qty 16

## 2016-06-07 MED ORDER — VANCOMYCIN HCL 10 G IV SOLR
1750.0000 mg | Freq: Once | INTRAVENOUS | Status: DC
  Filled 2016-06-07: qty 1750

## 2016-06-07 MED ORDER — SODIUM CHLORIDE 0.9% FLUSH
3.0000 mL | Freq: Two times a day (BID) | INTRAVENOUS | Status: DC
  Administered 2016-06-07 – 2016-06-09 (×3): 3 mL via INTRAVENOUS

## 2016-06-07 MED ORDER — ONDANSETRON HCL 4 MG PO TABS: 4.0000 mg | ORAL_TABLET | Freq: Three times a day (TID) | ORAL | Status: DC | PRN

## 2016-06-07 MED ORDER — ONDANSETRON HCL 4 MG/2ML IJ SOLN: 4.0000 mg | Freq: Four times a day (QID) | INTRAMUSCULAR | Status: DC | PRN

## 2016-06-07 MED ORDER — PROCHLORPERAZINE MALEATE 10 MG PO TABS: 10.0000 mg | ORAL_TABLET | Freq: Four times a day (QID) | ORAL | Status: DC | PRN

## 2016-06-07 NOTE — H&P (Signed)
[facility] NAME: Jeremy Gordon    MR#:  [id number]  DATE OF BIRTH:  1940-08-19  DATE OF ADMISSION:  06/07/2016  PRIMARY CARE PHYSICIAN: [name], MD   REQUESTING/REFERRING PHYSICIAN: Marsa Aris MD  CHIEF COMPLAINT:   Chief Complaint  Patient presents with  . Weakness    HISTORY OF PRESENT ILLNESS: Jeremy Gordon  is a 76 y.o. male with a known history of Osteoarthritis, atrial fibrillation, COPD, esophageal carcinoma, essential hypertension, GERD who is presenting to the ER with complaint of generalized weakness and cough congestion and shortness of breath. Patient is a very poor historian he states that he has been feeling like this for the past few days. He has not had any fevers or chills. Denies any chest palpitation or palpitations. Patient in emergency room has noted to have a heart rate that is intermittently elevated with A. fib RVR.  PAST MEDICAL HISTORY:   Past Medical History:  Diagnosis Date  . Arthritis   . Atrial fibrillation ([place])   . COPD (chronic obstructive pulmonary disease) ([place])   . Dysrhythmia   . Esophageal carcinoma ([place])    a. Chemo: FOLFIRI  . Essential hypertension   . GERD (gastroesophageal reflux disease)   . History of stress test    a. 06/2003 MV: EF 56%, no ischemia/infarct.  . Persistent atrial fibrillation ([place])    a. 07/2013 Echo: EF 60-65%, mod dil LA, mild TR;  CHA2DS2VASc = 3-->chronic coumadin.  . Sleep apnea     PAST SURGICAL HISTORY: Past Surgical History:  Procedure Laterality Date  . ESOPHAGOGASTRODUODENOSCOPY (EGD) WITH PROPOFOL N/A 08/31/2015   Procedure: ESOPHAGOGASTRODUODENOSCOPY (EGD) WITH PROPOFOL;  Surgeon: Manya Silvas, MD;  Location: [facility] ENDOSCOPY;  Service: Endoscopy;  Laterality: N/A;  . ESOPHAGOGASTRODUODENOSCOPY (EGD) WITH PROPOFOL N/A 02/26/2016   Procedure: ESOPHAGOGASTRODUODENOSCOPY (EGD) WITH PROPOFOL;  Surgeon: Manya Silvas, MD;  Location: [facility]  ENDOSCOPY;  Service: Endoscopy;  Laterality: N/A;  . FEMUR IM NAIL Right 01/17/2016   Procedure: INTRAMEDULLARY (IM) NAIL FEMORAL;  Surgeon: Earnestine Leys, MD;  Location: [facility] ORS;  Service: Orthopedics;  Laterality: Right;  . HERNIA REPAIR    . PERIPHERAL VASCULAR CATHETERIZATION N/A 11/20/2014   Procedure: Glori Luis Cath Insertion;  Surgeon: Algernon Huxley, MD;  Location: [facility] LAB;  Service: Cardiovascular;  Laterality: N/A;    SOCIAL HISTORY:  Social History  Substance Use Topics  . Smoking status: Current Some Day Smoker    Years: 62.00  . Smokeless tobacco: Current User    Types: Snuff  . Alcohol use No    FAMILY HISTORY:  Family History  Problem Relation Age of Onset  . Cancer Mother   . Stroke Father   . Hypertension Sister   . Hypertension Brother     DRUG ALLERGIES: No Known Allergies  REVIEW OF SYSTEMS:   CONSTITUTIONAL: No fever, fatigue or weakness.  EYES: No blurred or double vision.  EARS, NOSE, AND THROAT: No tinnitus or ear pain.  RESPIRATORY: Positive cough, positive shortness of breath, positive wheezing or no hemoptysis.  CARDIOVASCULAR: No chest pain, orthopnea, edema.  GASTROINTESTINAL: No nausea, vomiting, diarrhea or abdominal pain.  GENITOURINARY: No dysuria, hematuria.  ENDOCRINE: No polyuria, nocturia,  HEMATOLOGY: No anemia, easy bruising or bleeding SKIN: No rash or lesion. MUSCULOSKELETAL: No joint pain or arthritis.   NEUROLOGIC: No tingling, numbness, weakness.  PSYCHIATRY: No anxiety or depression.   MEDICATIONS AT HOME:  Prior to Admission medications  Medication Sig Start Date End Date Taking? Authorizing Provider  albuterol (PROVENTIL HFA;VENTOLIN HFA) 108 (90 Base) MCG/ACT inhaler Inhale 1-2 puffs into the lungs every 6 (six) hours as needed.  08/28/15 08/27/16 Yes Historical Provider, MD  diltiazem (CARDIZEM CD) 120 MG 24 hr capsule Take 120 mg by mouth daily.  11/09/15  Yes Historical Provider, MD  enalapril (VASOTEC) 20 MG  tablet Take 20 mg by mouth daily.    Yes Historical Provider, MD  finasteride (PROSCAR) 5 MG tablet Take 5 mg by mouth daily.   Yes Historical Provider, MD  fluticasone (FLONASE) 50 MCG/ACT nasal spray ONE SPRAY IN EACH NOSTRIL AT NIGHT AS DIRECTED 07/01/15  Yes Historical Provider, MD  gabapentin (NEURONTIN) 300 MG capsule Take 300 mg by mouth 2 (two) times daily.  07/07/15  Yes Historical Provider, MD  metoprolol (LOPRESSOR) 50 MG tablet Take 50 mg by mouth 2 (two) times daily.  07/30/15 07/29/16 Yes Historical Provider, MD  morphine (ROXANOL) 20 MG/ML concentrated solution Take 0.5 mLs (10 mg total) by mouth every 6 (six) hours as needed for severe pain. 05/31/16  Yes Lloyd Huger, MD  omeprazole (PRILOSEC) 20 MG capsule Take 1 capsule (20 mg total) by mouth daily. Patient taking differently: Take 20 mg by mouth 2 (two) times daily before a meal.  12/03/15  Yes Lloyd Huger, MD  ondansetron (ZOFRAN) 4 MG tablet Take 1 tablet (4 mg total) by mouth every 8 (eight) hours as needed for nausea or vomiting. 05/06/16  Yes Lisa Roca, MD  prochlorperazine (COMPAZINE) 10 MG tablet Take 1 tablet (10 mg total) by mouth every 6 (six) hours as needed for nausea or vomiting. 11/13/15  Yes Lloyd Huger, MD  promethazine (PHENERGAN) 25 MG suppository Place 1 suppository (25 mg total) rectally every 6 (six) hours as needed for nausea or vomiting. 05/23/16  Yes Lloyd Huger, MD  senna (SENOKOT) 8.6 MG TABS tablet Take 1 tablet (8.6 mg total) by mouth 2 (two) times daily. 01/19/16  Yes Demetrios Loll, MD  tamsulosin (FLOMAX) 0.4 MG CAPS capsule Take 0.4 mg by mouth daily. 08/25/15  Yes Historical Provider, MD  warfarin (COUMADIN) 4 MG tablet Take 2 tablets by mouth with evening meal 07/27/15  Yes Historical Provider, MD  digoxin (LANOXIN) 0.125 MG tablet Take 0.125 mg by mouth daily.  02/02/15   Historical Provider, MD  fluticasone (FLOVENT HFA) 220 MCG/ACT inhaler Inhale into the lungs. 08/28/15 08/27/16   Historical Provider, MD  furosemide (LASIX) 20 MG tablet Take 20 mg by mouth. 09/05/14   Historical Provider, MD      PHYSICAL EXAMINATION:   VITAL SIGNS: Blood pressure 138/87, pulse (!) 109, temperature 98.5 F (36.9 C), temperature source Oral, resp. rate (!) 26, height 5\' 7"  (1.702 m), weight 190 lb (86.2 kg), SpO2 99 %.  GENERAL:  76 y.o.-year-old patient lying in the bed with no acute distress.  EYES: Pupils equal, round, reactive to light and accommodation. No scleral icterus. Extraocular muscles intact.  HEENT: Head atraumatic, normocephalic. Oropharynx and nasopharynx clear.  NECK:  Supple, no jugular venous distention. No thyroid enlargement, no tenderness.  LUNGS: Occasional wheezing, rales,rhonchi or crepitation. No use of accessory muscles of respiration.  CARDIOVASCULAR: Irregularly regular No murmurs, rubs, or gallops.  ABDOMEN: Soft, nontender, nondistended. Bowel sounds present. No organomegaly or mass.  EXTREMITIES: No pedal edema, cyanosis, or clubbing.  NEUROLOGIC: Cranial nerves II through XII are intact. Muscle strength 5/5 in all extremities. Sensation intact. Gait not checked.  PSYCHIATRIC: The patient is alert and oriented x 3.  SKIN: No obvious rash, lesion, or ulcer.   LABORATORY PANEL:   CBC  Recent Labs Lab 06/07/16 1256  WBC 2.3*  HGB 10.7*  HCT 31.6*  PLT 95*  MCV 85.2  MCH 29.0  MCHC 34.0  RDW 17.2*   ------------------------------------------------------------------------------------------------------------------  Chemistries   Recent Labs Lab 06/07/16 1256  NA 133*  K 3.3*  CL 99*  CO2 28  GLUCOSE 91  BUN 9  CREATININE 0.61  CALCIUM 8.0*  AST 20  ALT 13*  ALKPHOS 72  BILITOT 1.1   ------------------------------------------------------------------------------------------------------------------ estimated creatinine clearance is 81 mL/min (by C-G formula based on SCr of 0.61  mg/dL). ------------------------------------------------------------------------------------------------------------------ No results for input(s): TSH, T4TOTAL, T3FREE, THYROIDAB in the last 72 hours.  Invalid input(s): FREET3   Coagulation profile No results for input(s): INR, PROTIME in the last 168 hours. ------------------------------------------------------------------------------------------------------------------- No results for input(s): DDIMER in the last 72 hours. -------------------------------------------------------------------------------------------------------------------  Cardiac Enzymes  Recent Labs Lab 06/07/16 1256  TROPONINI 0.03*   ------------------------------------------------------------------------------------------------------------------ Invalid input(s): POCBNP  ---------------------------------------------------------------------------------------------------------------  Urinalysis    Component Value Date/Time   COLORURINE YELLOW (A) 06/07/2016 1450   APPEARANCEUR CLEAR (A) 06/07/2016 1450   LABSPEC 1.009 06/07/2016 1450   PHURINE 8.0 06/07/2016 1450   GLUCOSEU NEGATIVE 06/07/2016 1450   HGBUR NEGATIVE 06/07/2016 1450   BILIRUBINUR NEGATIVE 06/07/2016 1450   KETONESUR 5 (A) 06/07/2016 1450   PROTEINUR NEGATIVE 06/07/2016 1450   NITRITE NEGATIVE 06/07/2016 1450   LEUKOCYTESUR NEGATIVE 06/07/2016 1450     RADIOLOGY: Dg Chest 2 View  Result Date: 06/07/2016 CLINICAL DATA:  76 year old male with shortness breath for 4 days. Symptoms started after radiation therapy treatment for esophageal cancer. Initial encounter. EXAM: CHEST  2 VIEW COMPARISON:  01/15/2016 01/26/2015 chest x-ray. FINDINGS: Mild progression of parenchymal changes left lung base. Question post therapy changes versus infiltrate. Central pulmonary vascular prominence stable. Right central line tip distal superior vena cava. No plain film evidence of pulmonary metastatic  disease. Cardiomegaly. Calcified aorta. Shoulder joint degenerative changes greater on the right. IMPRESSION: Mild progression of parenchymal changes left lung base. Question post therapy changes versus infiltrate. Electronically Signed   By: Genia Del M.D.   On: 06/07/2016 14:43    EKG: Orders placed or performed during the hospital encounter of 06/07/16  . ED EKG 12-Lead  . ED EKG 12-Lead    IMPRESSION AND PLAN: Patient is a 76 year old presenting with cough shortness of breath and weakness  1. Shortness of breath and cough likely due to acute bronchitis chest x-ray questionable infiltrate I will treat with IV Solu-Medrol nebulizers and antibiotics  2. A. fib with RVR intermittently heart rate elevated we'll continue his home Cardizem and IV Cardizem as needed will monitor on telemetry  3. BPH will continue finasteride  4. GERD continue omeprazole  5. Essential hypertension we'll continue enalapril  6. Generalized weakness obtain physical therapy evaluation  7. Misc: coumadin All the records are reviewed and case discussed with ED provider. Management plans discussed with the patient, family and they are in agreement.  CODE STATUS: Code Status History    Date Active Date Inactive Code Status Order ID Comments User Context   01/17/2016 10:54 AM 01/19/2016  8:02 PM Full Code [id number]  Earnestine Leys, MD Inpatient   01/15/2016  3:36 PM 01/15/2016 10:25 PM Full Code [id number]  Demetrios Loll, MD Inpatient   01/26/2015  6:21 PM 02/02/2015  6:18 PM Full Code [id number]  [place]an Basta,  MD ED       TOTAL TIME TAKING CARE OF THIS PATIENT: 42minutes.    Dustin Flock M.D on 06/07/2016 at 3:21 PM  Between 7am to 6pm - Pager - [phone]  After 6pm go to [email or web address] - password [email or web address] [facility]  Office  [phone]  CC: Primary care physician; [name], MD

## 2016-06-07 NOTE — ED Provider Notes (Signed)
[facility] Emergency Department Provider Note  ____________________________________________  Time seen: Approximately 12:42 PM  I have reviewed the triage vital signs and the nursing notes.   HISTORY  Chief Complaint Weakness    HPI Jeremy Gordon is a 76 y.o. male with a history of esophageal cancer, last chemotherapy last week, COPD, A. fib, presenting with cough with congestion and rhinorrhea, shortness of breath,nausea without vomiting, and generalized weakness. The symptoms have been going on for 4 days. No chest pain, nausea vomiting or diarrhea, or abdominal pain.   Past Medical History:  Diagnosis Date  . Arthritis   . Atrial fibrillation ([place])   . COPD (chronic obstructive pulmonary disease) ([place])   . Dysrhythmia   . Esophageal carcinoma ([place])    a. Chemo: FOLFIRI  . Essential hypertension   . GERD (gastroesophageal reflux disease)   . History of stress test    a. 06/2003 MV: EF 56%, no ischemia/infarct.  . Persistent atrial fibrillation ([place])    a. 07/2013 Echo: EF 60-65%, mod dil LA, mild TR;  CHA2DS2VASc = 3-->chronic coumadin.  . Sleep apnea     Patient Active Problem List   Diagnosis Date Noted  . Goals of care, counseling/discussion 05/16/2016  . Closed right hip fracture ([place]) 01/15/2016  . Malignant neoplasm of lower third of esophagus ([place]) 09/10/2015  . Pneumonitis due to inhalation of food or vomitus ([place]) 08/24/2015  . Persistent atrial fibrillation ([place]) 01/29/2015  . Ventricular tachycardia ([place]) 01/29/2015  . Sepsis ([place]) 01/29/2015  . Essential hypertension 01/29/2015  . Hypokalemia 01/29/2015  . Hypomagnesemia 01/29/2015  . Systemic infection ([place]) 01/29/2015  . Atrial fibrillation ([place])   . Aspiration pneumonia of right lower lobe ([place])   . Pneumonia 01/26/2015  . PNA (pneumonia) 01/26/2015  . Chest wall pain 10/08/2014  . Chest pain 10/08/2014  . Dysphagia 02/05/2014  . Apnea, sleep 10/09/2013  . Carpal  tunnel syndrome 03/11/2013  . Sciatica 11/23/2012  . Acid reflux 11/23/2012  . Gastro-esophageal reflux disease without esophagitis 11/23/2012  . 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 Enlarged prostate 10/15/2012  . Allergic rhinitis 08/15/2012  . Back ache 08/15/2012  . Atrial fibrillation, chronic ([place]) 08/15/2012  . CAFL (chronic airflow limitation) ([place]) 08/15/2012  . Breathlessness on exertion 08/15/2012  . BP (high blood pressure) 08/15/2012  . Malaise and fatigue 08/15/2012  . 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 Arthritis of knee, degenerative 08/15/2012  . 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 Lumbar canal stenosis 08/15/2012  . Chronic obstructive pulmonary disease ([place]) 08/15/2012  . Dorsalgia 08/15/2012  . Essential (primary) hypertension 08/15/2012  . Breath shortness 08/15/2012  . Does not feel right 08/15/2012  . Post viral syndrome 08/15/2012  . 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 ESOPHAGOGASTRODUODENOSCOPY (EGD) WITH PROPOFOL N/A 08/31/2015   Procedure: ESOPHAGOGASTRODUODENOSCOPY (EGD) WITH PROPOFOL;  Surgeon: Manya Silvas, MD;  Location: [facility] ENDOSCOPY;  Service: Endoscopy;  Laterality: N/A;  . ESOPHAGOGASTRODUODENOSCOPY (EGD) WITH PROPOFOL N/A 02/26/2016   Procedure: ESOPHAGOGASTRODUODENOSCOPY (EGD) WITH PROPOFOL;  Surgeon: Manya Silvas, MD;  Location: [facility] ENDOSCOPY;  Service: Endoscopy;  Laterality: N/A;  . FEMUR IM NAIL Right 01/17/2016   Procedure: INTRAMEDULLARY (IM) NAIL FEMORAL;  Surgeon: Earnestine Leys, MD;  Location: [facility] ORS;  Service: Orthopedics;  Laterality: Right;  . HERNIA REPAIR    . PERIPHERAL VASCULAR CATHETERIZATION N/A 11/20/2014   Procedure: Glori Luis Cath Insertion;  Surgeon:  Algernon Huxley, MD;  Location: [facility];  Service: Cardiovascular;  Laterality: N/A;    Current Outpatient Rx  . Order #: [id number] Class: Historical Med   . Order #: [id number] Class: Historical Med  . Order #: [id number] Class: Historical Med  . Order #: [id number] Class: Historical Med  . Order #: [id number] Class: Historical Med  . Order #: [id number] Class: Historical Med  . Order #: [id number] Class: Historical Med  . Order #: [id number] Class: Print  . Order #: [id number] Class: Normal  . Order #: [id number] Class: Print  . Order #: [id number] Class: Print  . Order #: [id number] Class: Normal  . Order #: [id number] Class: Print  . Order #: [id number] Class: Historical Med  . Order #: [id number] Class: Historical Med  . Order #: [id number] Class: Historical Med  . Order #: [id number] Class: Historical Med  . Order #: [id number] Class: Historical Med    Allergies Patient has no known allergies.  Family History  Problem Relation Age of Onset  . Cancer Mother   . Stroke Father   . Hypertension Sister   . Hypertension Brother     Social History Social History  Substance Use Topics  . Smoking status: Current Some Day Smoker    Years: 62.00  . Smokeless tobacco: Current User    Types: Snuff  . Alcohol use No    Review of Systems Constitutional: No fever/chills.No lightheadedness or syncope. Eyes: No visual changes. ENT: No sore throat. Positive congestion and rhinorrhea. Cardiovascular: Denies chest pain. Denies palpitations. Respiratory: Positive shortness of breath.  Positive cough. Gastrointestinal: No abdominal pain.  Positive nausea, no vomiting.  No diarrhea.  No constipation. Genitourinary: Negative for dysuria. Musculoskeletal: Negative for back pain. Skin: Negative for rash. Neurological: Negative for headaches. No focal numbness, tingling or weakness.   10-point ROS otherwise negative.  ____________________________________________   PHYSICAL EXAM:  VITAL SIGNS: ED Triage Vitals  Enc Vitals Group     BP 06/07/16 1205 (!) 138/92     Pulse Rate 06/07/16 1205 (!) 119     Resp 06/07/16 1205 (!) 21     Temp 06/07/16 1205 98.5 F  (36.9 C)     Temp Source 06/07/16 1205 Oral     SpO2 06/07/16 1205 98 %     Weight 06/07/16 1209 190 lb (86.2 kg)     Height 06/07/16 1209 5\' 7"  (1.702 m)     Head Circumference --      Peak Flow --      Pain Score 06/07/16 1210 10     Pain Loc --      Pain Edu? --      Excl. in [place]? --     Constitutional: Alert and oriented. Medically ill appearing but nontoxic. Answers questions appropriately. Eyes: Conjunctivae are normal.  EOMI. No scleral icterus. Head: Atraumatic. Nose: No congestion/rhinnorhea. Mouth/Throat: Mucous membranes are very dry.  Neck: No stridor.  Supple.  No JVD. No meningismus. Cardiovascular: Upon entering the room, the patient has a heart rate in the 90s, but immediately his heart rate goes up to the 110's speaking only, irregular rhythm. No murmurs, rubs or gallops.  Respiratory: Mild tachypnea with accessory muscle use but no retractions. No wheezes rales or rhonchi. Prolonged expiratory phase. Gastrointestinal: Soft, nontender and nondistended.  No guarding or rebound.  No peritoneal signs. Musculoskeletal: No LE edema. No ttp in the calves or palpable cords.  Negative Homan's sign. Neurologic:  A&Ox3.  Speech is clear.  Face and smile are symmetric.  EOMI.  Moves all extremities well. Skin:  Skin  is warm, dry and intact. No rash noted. Psychiatric: Mood and affect are normal. Speech and behavior are normal.  Normal judgement.  ____________________________________________   LABS (all labs ordered are listed, but only abnormal results are displayed)  Labs Reviewed  CBC - Abnormal; Notable for the following:       Result Value   WBC 2.3 (*)    RBC 3.70 (*)    Hemoglobin 10.7 (*)    HCT 31.6 (*)    RDW 17.2 (*)    Platelets 95 (*)    All other components within normal limits  COMPREHENSIVE METABOLIC PANEL - Abnormal; Notable for the following:    Sodium 133 (*)    Potassium 3.3 (*)    Chloride 99 (*)    Calcium 8.0 (*)    Total Protein 5.4 (*)     Albumin 2.7 (*)    ALT 13 (*)    All other components within normal limits  BLOOD GAS, VENOUS - Abnormal; Notable for the following:    pH, Ven 7.48 (*)    pCO2, Ven 41 (*)    pO2, Ven <31.0 (*)    Bicarbonate 30.5 (*)    Acid-Base Excess 6.4 (*)    All other components within normal limits  CULTURE, BLOOD (ROUTINE X 2)  CULTURE, BLOOD (ROUTINE X 2)  URINE CULTURE  INFLUENZA PANEL BY PCR (TYPE A & B)  LACTIC ACID, PLASMA  URINALYSIS, COMPLETE (UACMP) WITH MICROSCOPIC  URINALYSIS, ROUTINE W REFLEX MICROSCOPIC  LACTIC ACID, PLASMA  TROPONIN I   ____________________________________________  EKG  ED ECG REPORT I, Eula Listen, the attending physician, personally viewed and interpreted this ECG.   Date: 06/07/2016  EKG Time: 1209  Rate: 127  Rhythm: atrial fibrillation with RVR  Axis: leftward  Intervals:prolonged QTc  ST&T Change: No STEMI  ____________________________________________  RADIOLOGY  Dg Chest 2 View  Result Date: 06/07/2016 CLINICAL DATA:  76 year old male with shortness breath for 4 days. Symptoms started after radiation therapy treatment for esophageal cancer. Initial encounter. EXAM: CHEST  2 VIEW COMPARISON:  01/15/2016 01/26/2015 chest x-ray. FINDINGS: Mild progression of parenchymal changes left lung base. Question post therapy changes versus infiltrate. Central pulmonary vascular prominence stable. Right central line tip distal superior vena cava. No plain film evidence of pulmonary metastatic disease. Cardiomegaly. Calcified aorta. Shoulder joint degenerative changes greater on the right. IMPRESSION: Mild progression of parenchymal changes left lung base. Question post therapy changes versus infiltrate. Electronically Signed   By: Genia Del M.D.   On: 06/07/2016 14:43    ____________________________________________   PROCEDURES  Procedure(s) performed: None  Procedures  Critical Care performed:  Yes ____________________________________________   INITIAL IMPRESSION / ASSESSMENT AND PLAN / ED COURSE  Pertinent labs & imaging results that were available during my care of the patient were reviewed by me and considered in my medical decision making (see chart for details).  76 y.o. male on chemotherapy for esophageal cancer, COPD and significant cardiac history presenting with several days of progressively worsening weakness, cough, and shortness of breath. Given the patient's immunocompromised state, I have initiated a code sepsis and will order an immediate empiric antibiotics. I'm concerned about pneumonia, but would consider influenza or flulike illness as well. The patient will require admission to the hospital for further evaluation and treatment.  CRITICAL CARE Performed by: Eula Listen   Total critical care time: 35 minutes  Critical care time was exclusive of separately billable procedures and treating other patients.  Critical care was  necessary to treat or prevent imminent or life-threatening deterioration.  Critical care was time spent personally by me on the following activities: development of treatment plan with patient and/or surrogate as well as nursing, discussions with consultants, evaluation of patient's response to treatment, examination of patient, obtaining history from patient or surrogate, ordering and performing treatments and interventions, ordering and review of laboratory studies, ordering and review of radiographic studies, pulse oximetry and re-evaluation of patient's condition.  ----------------------------------------- 2:52 PM on 06/07/2016 -----------------------------------------  The patient does have A. fib with RVR, but this is likely driven by his infection, rather than a primary arrhythmia. I will treat him with intravenous fluids, and upon reevaluation his heart rate has improved. His chest x-ray does show a possible infiltrate in the  left lung base, and his influenza testing is negative. He has received empiric antibiotics. Plan admission. ____________________________________________  FINAL CLINICAL IMPRESSION(S) / ED DIAGNOSES  Final diagnoses:  HCAP (healthcare-associated pneumonia)         NEW MEDICATIONS STARTED DURING THIS VISIT:  New Prescriptions   No medications on file      Eula Listen, MD 06/07/16 1453

## 2016-06-07 NOTE — Progress Notes (Signed)
ANTIBIOTIC NOTE - INITIAL  Pharmacy Consult for doxycycline Indication: pneumonia  No Known Allergies  Patient Measurements: Height: 5\' 7"  (170.2 cm) Weight: 190 lb (86.2 kg) IBW/kg (Calculated) : 66.1 Adjusted Body Weight:   Vital Signs: Temp: 98.5 F (36.9 C) (02/20 1205) Temp Source: Oral (02/20 1205) BP: 138/87 (02/20 1500) Pulse Rate: 109 (02/20 1500) Intake/Output from previous day: No intake/output data recorded. Intake/Output from this shift: Total I/O In: 1000 [IV Piggyback:1000] Out: -   Labs:  Recent Labs  06/07/16 1256  WBC 2.3*  HGB 10.7*  PLT 95*  CREATININE 0.61   Estimated Creatinine Clearance: 81 mL/min (by C-G formula based on SCr of 0.61 mg/dL). No results for input(s): VANCOTROUGH, VANCOPEAK, VANCORANDOM, GENTTROUGH, GENTPEAK, GENTRANDOM, TOBRATROUGH, TOBRAPEAK, TOBRARND, AMIKACINPEAK, AMIKACINTROU, AMIKACIN in the last 72 hours.   Microbiology: No results found for this or any previous visit (from the past 720 hour(s)).  Medical History: Past Medical History:  Diagnosis Date  . Arthritis   . Atrial fibrillation ([place])   . COPD (chronic obstructive pulmonary disease) ([place])   . Dysrhythmia   . Esophageal carcinoma ([place])    a. Chemo: FOLFIRI  . Essential hypertension   . GERD (gastroesophageal reflux disease)   . History of stress test    a. 06/2003 MV: EF 56%, no ischemia/infarct.  . Persistent atrial fibrillation ([place])    a. 07/2013 Echo: EF 60-65%, mod dil LA, mild TR;  CHA2DS2VASc = 3-->chronic coumadin.  . Sleep apnea     Medications:  Scheduled:  . ipratropium-albuterol  3 mL Nebulization Q6H  . methylPREDNISolone (SOLU-MEDROL) injection  60 mg Intravenous Q6H   Assessment: Patient w/ h/o esophageal cancer s/p chemo admitted w/ cough, sob, and possible infiltrate on CXR. Patient had an EKG on 1/19 w/ Qtc of 524. Patient was given one dose of vanc 1750 mg and zosyn 3.375 g then switched to levaquin; however, considering patient  had a h/o elevated Qtc recommended to switch to doxycycline for empiric tx of CAP  Goal of Therapy:  Tx of CAP/acute bronchitis w/ rule out or resolution of signs/symptoms of infection  Plan:  Doxycycline 100 mg IV q12 hours. Follow up culture results  Tobie Lords, PharmD, BCPS Clinical Pharmacist 06/07/2016

## 2016-06-07 NOTE — ED Notes (Signed)
Patient transported to X-ray 

## 2016-06-07 NOTE — ED Triage Notes (Addendum)
Pt arrived by EMS from home where he lives with his son. HX esophageal cancer. Received chemo X4 days ago. Pt reports ever since he had chemo he is very weak, cough and congestion and intermittent SOB. Pt is suppose to receive radiation everyday and has not been going due to becoming very sick from chemo. EMS B/p 130/83. Pt A&O x3

## 2016-06-07 NOTE — Progress Notes (Addendum)
ANTICOAGULATION CONSULT NOTE - Initial Consult  Pharmacy Consult for warfarin dosing Indication: atrial fibrillation  No Known Allergies  Patient Measurements: Height: 5\' 7"  (170.2 cm) Weight: 190 lb (86.2 kg) IBW/kg (Calculated) : 66.1 Heparin Dosing Weight:  Vital Signs: Temp: 98.3 F (36.8 C) (02/20 1700) Temp Source: Oral (02/20 1700) BP: 132/91 (02/20 1700) Pulse Rate: 62 (02/20 1700)  Labs:  Recent Labs  06/07/16 1256 06/07/16 1651  HGB 10.7*  --   HCT 31.6*  --   PLT 95*  --   LABPROT  --  21.8*  INR  --  1.87  CREATININE 0.61  --   TROPONINI 0.03*  --     Estimated Creatinine Clearance: 81 mL/min (by C-G formula based on SCr of 0.61 mg/dL).   Medical History: Past Medical History:  Diagnosis Date  . Arthritis   . Atrial fibrillation ([place])   . COPD (chronic obstructive pulmonary disease) ([place])   . Dysrhythmia   . 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 Chemo: FOLFIRI  . Essential hypertension   . GERD (gastroesophageal reflux disease)   . History of stress test    a. 06/2003 MV: EF 56%, no ischemia/infarct.  . Persistent atrial fibrillation ([place])    a. 07/2013 Echo: EF 60-65%, mod dil LA, mild TR;  CHA2DS2VASc = 3-->chronic coumadin.  . Sleep apnea     Medications:  Scheduled:  . budesonide (PULMICORT) nebulizer solution  0.25 mg Nebulization BID  . digoxin  0.125 mg Oral Daily  . diltiazem  120 mg Oral Daily  . [START ON 06/08/2016] doxycycline (VIBRAMYCIN) IV  100 mg Intravenous Q12H  . enalapril  20 mg Oral Daily  . finasteride  5 mg Oral Daily  . fluticasone  2 spray Each Nare Daily  . gabapentin  300 mg Oral BID  . ipratropium-albuterol  3 mL Nebulization Q6H  . methylPREDNISolone (SOLU-MEDROL) injection  60 mg Intravenous Q6H  . metoprolol  50 mg Oral BID  . pantoprazole  40 mg Oral Daily  . senna  1 tablet Oral BID  . sodium chloride flush  3 mL Intravenous Q12H  . tamsulosin  0.4 mg Oral Daily    Assessment: Patient w/ h/o esophageal  cancer s/p chemo admitted for SOB, cough -- patient has h/o afib and is being anticoagulated w/ warfarin. Patient's home warfarin dose is 2 tablets of 4 mg daily (8 mg total daily). INR 2/20 1.87   Goal of Therapy:  INR 2-3 Monitor platelets by anticoagulation protocol: Yes   Plan:  Will administer slightly lower dose of warfarin 6 mg tonight since patient is acutely ill and is on abx and will recheck daily INRs.  Continue to monitor H&H and platelets  Thank you for this consult.  Tobie Lords, PharmD, BCPS Clinical Pharmacist 06/07/2016

## 2016-06-08 ENCOUNTER — Ambulatory Visit: Payer: Medicare Other

## 2016-06-08 DIAGNOSIS — R531 Weakness: Secondary | ICD-10-CM | POA: Diagnosis not present

## 2016-06-08 LAB — CBC
HEMATOCRIT: 28.5 % — AB (ref 40.0–52.0)
HEMOGLOBIN: 10.1 g/dL — AB (ref 13.0–18.0)
MCH: 30 pg (ref 26.0–34.0)
MCHC: 35.5 g/dL (ref 32.0–36.0)
MCV: 84.7 fL (ref 80.0–100.0)
PLATELETS: 85 10*3/uL — AB (ref 150–440)
RBC: 3.36 MIL/uL — AB (ref 4.40–5.90)
RDW: 17 % — ABNORMAL HIGH (ref 11.5–14.5)
WBC: 1.5 10*3/uL — AB (ref 3.8–10.6)

## 2016-06-08 LAB — PROTIME-INR
INR: 1.87
Prothrombin Time: 21.8 seconds — ABNORMAL HIGH (ref 11.4–15.2)

## 2016-06-08 LAB — URINE CULTURE

## 2016-06-08 LAB — BASIC METABOLIC PANEL
ANION GAP: 7 (ref 5–15)
BUN: 7 mg/dL (ref 6–20)
CHLORIDE: 104 mmol/L (ref 101–111)
CO2: 23 mmol/L (ref 22–32)
Calcium: 7.8 mg/dL — ABNORMAL LOW (ref 8.9–10.3)
Creatinine, Ser: 0.71 mg/dL (ref 0.61–1.24)
GFR calc Af Amer: >60 mL/min (ref >60)
Glucose, Bld: 93 mg/dL (ref 65–99)
POTASSIUM: 3.3 mmol/L — AB (ref 3.5–5.1)
Sodium: 134 mmol/L — ABNORMAL LOW (ref 135–145)

## 2016-06-08 MED ORDER — DOXYCYCLINE HYCLATE 100 MG PO TABS
100.0000 mg | ORAL_TABLET | Freq: Two times a day (BID) | ORAL | Status: DC
  Administered 2016-06-08 – 2016-06-09 (×2): 100 mg via ORAL
  Filled 2016-06-08 (×2): qty 1

## 2016-06-08 MED ORDER — IPRATROPIUM-ALBUTEROL 0.5-2.5 (3) MG/3ML IN SOLN: 3.0000 mL | Freq: Four times a day (QID) | RESPIRATORY_TRACT | Status: DC | PRN

## 2016-06-08 MED ORDER — BUDESONIDE 0.5 MG/2ML IN SUSP
0.5000 mg | Freq: Two times a day (BID) | RESPIRATORY_TRACT | Status: DC
  Administered 2016-06-08 – 2016-06-09 (×2): 0.5 mg via RESPIRATORY_TRACT
  Filled 2016-06-08 (×2): qty 2

## 2016-06-08 MED ORDER — POTASSIUM CHLORIDE 20 MEQ PO PACK
40.0000 meq | PACK | Freq: Once | ORAL | Status: AC
  Administered 2016-06-08: 40 meq via ORAL
  Filled 2016-06-08: qty 2

## 2016-06-08 MED ORDER — OXYCODONE HCL 5 MG PO TABS: 5.0000 mg | ORAL_TABLET | Freq: Four times a day (QID) | ORAL | Status: DC | PRN

## 2016-06-08 MED ORDER — WARFARIN SODIUM 5 MG PO TABS
8.0000 mg | ORAL_TABLET | Freq: Once | ORAL | Status: AC
  Administered 2016-06-08: 8 mg via ORAL
  Filled 2016-06-08: qty 1

## 2016-06-08 MED ORDER — METHYLPREDNISOLONE SODIUM SUCC 125 MG IJ SOLR
60.0000 mg | Freq: Every day | INTRAMUSCULAR | Status: DC
  Administered 2016-06-09: 60 mg via INTRAVENOUS
  Filled 2016-06-08: qty 2

## 2016-06-08 MED ORDER — WARFARIN - PHARMACIST DOSING INPATIENT
Freq: Every day | Status: DC
  Administered 2016-06-08: 18:00:00

## 2016-06-08 NOTE — Progress Notes (Signed)
Patient ID: Jeremy Gordon, male   DOB: 1940/09/26, 76 y.o.   MRN: [id number]  [employer] PROGRESS NOTE  GARRIS BESCH [id number] DOB: 23-Nov-1940 DOA: 06/07/2016 PCP: [name], MD  HPI/Subjective: Patient had a fall out of bed. Family concerned that he is very weak. Patient complains of some right leg pain but is able to straight leg raise.  Objective: Vitals:   06/08/16 0356 06/08/16 1157  BP: 118/64 110/63  Pulse: (!) 120 69  Resp: 18 12  Temp: 99.9 F (37.7 C) 97.7 F (36.5 C)    Filed Weights   06/07/16 1209  Weight: 86.2 kg (190 lb)    ROS: Review of Systems  Constitutional: Negative for chills and fever.  Eyes: Negative for blurred vision.  Respiratory: Positive for cough and shortness of breath.   Cardiovascular: Negative for chest pain.  Gastrointestinal: Negative for abdominal pain, constipation, diarrhea, nausea and vomiting.  Genitourinary: Negative for dysuria.  Musculoskeletal: Positive for joint pain.  Neurological: Negative for dizziness and headaches.   Exam: Physical Exam  Constitutional: He is oriented to person, place, and time.  HENT:  Nose: No mucosal edema.  Mouth/Throat: No oropharyngeal exudate or posterior oropharyngeal edema.  Eyes: Conjunctivae, EOM and lids are normal. Pupils are equal, round, and reactive to light.  Neck: No JVD present. Carotid bruit is not present. No edema present. No thyroid mass and no thyromegaly present.  Cardiovascular: S1 normal and S2 normal.  Exam reveals no gallop.   No murmur heard. Pulses:      Dorsalis pedis pulses are 2+ on the right side, and 2+ on the left side.  Respiratory: No respiratory distress. He has decreased breath sounds in the right middle field, the right lower field, the left middle field and the left lower field. He has no wheezes. He has rhonchi in the right lower field and the left lower field. He has no rales.  GI: Soft. Bowel sounds are normal. There is no tenderness.   Musculoskeletal:       Right ankle: He exhibits swelling.       Left ankle: He exhibits swelling.  Lymphadenopathy:    He has no cervical adenopathy.  Neurological: He is alert and oriented to person, place, and time. No cranial nerve deficit.  Patient able to straight leg raise bilaterally  Skin: Skin is warm. No rash noted. Nails show no clubbing.  Psychiatric: He has a normal mood and affect.      Data Reviewed: Basic Metabolic Panel:  Recent Labs Lab 06/07/16 1256 06/08/16 0615  NA 133* 134*  K 3.3* 3.3*  CL 99* 104  CO2 28 23  GLUCOSE 91 93  BUN 9 7  CREATININE 0.61 0.71  CALCIUM 8.0* 7.8*   Liver Function Tests:  Recent Labs Lab 06/07/16 1256  AST 20  ALT 13*  ALKPHOS 72  BILITOT 1.1  PROT 5.4*  ALBUMIN 2.7*   CBC:  Recent Labs Lab 06/07/16 1256 06/08/16 0615  WBC 2.3* 1.5*  HGB 10.7* 10.1*  HCT 31.6* 28.5*  MCV 85.2 84.7  PLT 95* 85*   Cardiac Enzymes:  Recent Labs Lab 06/07/16 1256  TROPONINI 0.03*     Recent Results (from the past 240 hour(s))  Blood Culture (routine x 2)     Status: None (Preliminary result)   Collection Time: 06/07/16 12:56 PM  Result Value Ref Range Status   Specimen Description BLOOD R CHEST PORT  Final   Special Requests BOTTLES DRAWN AEROBIC AND  ANAEROBIC BCHV  Final   Culture NO GROWTH < 24 HOURS  Final   Report Status PENDING  Incomplete  Blood Culture (routine x 2)     Status: None (Preliminary result)   Collection Time: 06/07/16 12:57 PM  Result Value Ref Range Status   Specimen Description BLOOD R AC  Final   Special Requests BOTTLES DRAWN AEROBIC AND ANAEROBIC [place]  Final   Culture NO GROWTH < 24 HOURS  Final   Report Status PENDING  Incomplete  Urine culture     Status: Abnormal   Collection Time: 06/07/16  2:50 PM  Result Value Ref Range Status   Specimen Description URINE, RANDOM  Final   Special Requests NONE  Final   Culture MULTIPLE SPECIES PRESENT, SUGGEST RECOLLECTION (A)  Final   Report  Status 06/08/2016 FINAL  Final     Studies: Dg Chest 2 View  Result Date: 06/07/2016 CLINICAL DATA:  76 year old male with shortness breath for 4 days. Symptoms started after radiation therapy treatment for esophageal cancer. Initial encounter. EXAM: CHEST  2 VIEW COMPARISON:  01/15/2016 01/26/2015 chest x-ray. FINDINGS: Mild progression of parenchymal changes left lung base. Question post therapy changes versus infiltrate. Central pulmonary vascular prominence stable. Right central line tip distal superior vena cava. No plain film evidence of pulmonary metastatic disease. Cardiomegaly. Calcified aorta. Shoulder joint degenerative changes greater on the right. IMPRESSION: Mild progression of parenchymal changes left lung base. Question post therapy changes versus infiltrate. Electronically Signed   By: Genia Del M.D.   On: 06/07/2016 14:43    Scheduled Meds: . budesonide (PULMICORT) nebulizer solution  0.5 mg Nebulization BID  . digoxin  0.125 mg Oral Daily  . diltiazem  120 mg Oral Daily  . doxycycline  100 mg Oral BID  . enalapril  20 mg Oral Daily  . finasteride  5 mg Oral Daily  . fluticasone  2 spray Each Nare Daily  . gabapentin  300 mg Oral BID  . methylPREDNISolone (SOLU-MEDROL) injection  60 mg Intravenous Q6H  . metoprolol  50 mg Oral BID  . pantoprazole  40 mg Oral Daily  . senna  1 tablet Oral BID  . sodium chloride flush  3 mL Intravenous Q12H  . tamsulosin  0.4 mg Oral Daily  . warfarin  8 mg Oral ONCE-1800  . Warfarin - Pharmacist Dosing Inpatient   Does not apply q1800   Continuous Infusions: . sodium chloride 75 mL/hr at 06/08/16 0842  . sodium chloride 75 mL/hr at 06/07/16 1751    Assessment/Plan:  1. COPD exacerbation with possible pneumonia. Decrease Solu-Medrol to daily dosing. Doxycycline. Nebulizer treatments. 2. Atrial fibrillation with rapid ventricular response. Continue Cardizem and metoprolol. Increase the oral Cardizem dose for tomorrow. Pharmacy  dosing Coumadin. Patient also on digoxin 3. Weakness and fall out of bed. Evaluate with physical therapy. Patient able to straight leg raise sign on think anything is broken. 4. Esophageal cancer with hilar metastases. Patient states that he's been undergoing chemotherapy and radiation. Patient considering hospice care 5. GERD on Protonix 6. BPH on finasteride 7. Essential hypertension on metoprolol and Cardizem  Code Status:     Code Status Orders        Start     Ordered   06/07/16 1802  Full code  Continuous     06/07/16 1802    Code Status History    Date Active Date Inactive Code Status Order ID Comments User Context   01/17/2016 10:54 AM 01/19/2016  8:02  PM Full Code [id number]  Earnestine Leys, MD Inpatient   01/15/2016  3:36 PM 01/15/2016 10:25 PM Full Code [id number]  Demetrios Loll, MD Inpatient   01/26/2015  6:21 PM 02/02/2015  6:18 PM Full Code [id number]  Vaughan Basta, MD ED     Family Communication: Brothers at the bedside Disposition Plan: Awaiting physical therapy evaluation  Antibiotics:  Doxycycline  Time spent: 30 minutes  [place], [place]

## 2016-06-08 NOTE — Progress Notes (Signed)
ANTICOAGULATION CONSULT NOTE - Initial Consult  Pharmacy Consult for warfarin dosing Indication: atrial fibrillation  No Known Allergies  Patient Measurements: Height: 5\' 7"  (170.2 cm) Weight: 190 lb (86.2 kg) IBW/kg (Calculated) : 66.1 Heparin Dosing Weight:  Vital Signs: Temp: 99.9 F (37.7 C) (02/21 0356) Temp Source: Oral (02/21 0356) BP: 118/64 (02/21 0356) Pulse Rate: 120 (02/21 0356)  Labs:  Recent Labs  06/07/16 1256 06/07/16 1651 06/08/16 0615  HGB 10.7*  --  10.1*  HCT 31.6*  --  28.5*  PLT 95*  --  85*  LABPROT  --  21.8* 21.8*  INR  --  1.87 1.87  CREATININE 0.61  --  0.71  TROPONINI 0.03*  --   --     Estimated Creatinine Clearance: 81 mL/min (by C-G formula based on SCr of 0.71 mg/dL).   Medical History: Past Medical History:  Diagnosis Date  . Arthritis   . Atrial fibrillation ([place])   . COPD (chronic obstructive pulmonary disease) ([place])   . Dysrhythmia   . Esophageal carcinoma ([place])    a. Chemo: FOLFIRI  . Essential hypertension   . GERD (gastroesophageal reflux disease)   . History of stress test    a. 06/2003 MV: EF 56%, no ischemia/infarct.  . Persistent atrial fibrillation ([place])    a. 07/2013 Echo: EF 60-65%, mod dil LA, mild TR;  CHA2DS2VASc = 3-->chronic coumadin.  . Sleep apnea     Medications:  Scheduled:  . budesonide (PULMICORT) nebulizer solution  0.25 mg Nebulization BID  . digoxin  0.125 mg Oral Daily  . diltiazem  120 mg Oral Daily  . doxycycline (VIBRAMYCIN) IV  100 mg Intravenous Q12H  . enalapril  20 mg Oral Daily  . finasteride  5 mg Oral Daily  . fluticasone  2 spray Each Nare Daily  . gabapentin  300 mg Oral BID  . ipratropium-albuterol  3 mL Nebulization Q6H  . methylPREDNISolone (SOLU-MEDROL) injection  60 mg Intravenous Q6H  . metoprolol  50 mg Oral BID  . pantoprazole  40 mg Oral Daily  . senna  1 tablet Oral BID  . sodium chloride flush  3 mL Intravenous Q12H  . tamsulosin  0.4 mg Oral Daily  . warfarin  8  mg Oral ONCE-1800  . Warfarin - Pharmacist Dosing Inpatient   Does not apply q1800    Assessment: Patient w/ h/o esophageal cancer s/p chemo admitted for SOB, cough -- patient has h/o afib and is being anticoagulated w/ warfarin.  Patient's home warfarin dose is 2 tablets of 4 mg daily (8 mg total daily).  DATE  INR  DOSE 2/20  1.87  6mg  2/21  1.87    Goal of Therapy:  INR 2-3 Monitor platelets by anticoagulation protocol: Yes   Plan:  Will administer patients home dose of 8mg  tonight since INR subtherapeutic. INR monitored daily while on Abx.  Continue to monitor H&H and platelets  Thank you for this consult.  Pernell Dupre, PharmD, BCPS Clinical Pharmacist 06/08/2016 11:11 AM

## 2016-06-08 NOTE — Evaluation (Signed)
Clinical/Bedside Swallow Evaluation Patient Details  Name: Jeremy Gordon MRN: [id number] Date of Birth: 11-02-1940  Today's Date: 06/08/2016 Time: SLP Start Time (ACUTE ONLY): 1315 SLP Stop Time (ACUTE ONLY): 1400 SLP Time Calculation (min) (ACUTE ONLY): 45 min  Past Medical History:  Past Medical History:  Diagnosis Date  . Arthritis   . Atrial fibrillation ([place])   . COPD (chronic obstructive pulmonary disease) ([name])   . Dysrhythmia   . Esophageal carcinoma ([place])    a. Chemo: FOLFIRI  . Essential hypertension   . GERD (gastroesophageal reflux disease)   . History of stress test    a. 06/2003 MV: EF 56%, no ischemia/infarct.  . Persistent atrial fibrillation ([place])    a. 07/2013 Echo: EF 60-65%, mod dil LA, mild TR;  CHA2DS2VASc = 3-->chronic coumadin.  . Sleep apnea    Past Surgical History:  Past Surgical History:  Procedure Laterality Date  . ESOPHAGOGASTRODUODENOSCOPY (EGD) WITH PROPOFOL N/A 08/31/2015   Procedure: ESOPHAGOGASTRODUODENOSCOPY (EGD) WITH PROPOFOL;  Surgeon: Manya Silvas, MD;  Location: [facility] ENDOSCOPY;  Service: Endoscopy;  Laterality: N/A;  . ESOPHAGOGASTRODUODENOSCOPY (EGD) WITH PROPOFOL N/A 02/26/2016   Procedure: ESOPHAGOGASTRODUODENOSCOPY (EGD) WITH PROPOFOL;  Surgeon: Manya Silvas, MD;  Location: [facility] ENDOSCOPY;  Service: Endoscopy;  Laterality: N/A;  . FEMUR IM NAIL Right 01/17/2016   Procedure: INTRAMEDULLARY (IM) NAIL FEMORAL;  Surgeon: Earnestine Leys, MD;  Location: [facility] ORS;  Service: Orthopedics;  Laterality: Right;  . HERNIA REPAIR    . PERIPHERAL VASCULAR CATHETERIZATION N/A 11/20/2014   Procedure: Glori Luis Cath Insertion;  Surgeon: Algernon Huxley, MD;  Location: [facility] CV LAB;  Service: Cardiovascular;  Laterality: N/A;   HPI:  Pt  is a 76 y.o. male with a known history of Osteoarthritis, atrial fibrillation, COPD, esophageal carcinoma, essential hypertension, GERD who is presenting to the ER with complaint of generalized weakness and cough  congestion and shortness of breath. Patient is a very poor historian he states that he has been feeling like this for the past few days. He has not had any fevers or chills. Denies any chest palpitation or palpitations. Patient in emergency room has noted to have a heart rate that is intermittently elevated with A. fib RVR.. Currently pt is very talkative and able to converse at conversational level. Pt is in plesant mood but repeated wanting to go home today.    Assessment / Plan / Recommendation Clinical Impression  Pt appers to present at reduced risk for aspiration with thin liquids and softened foods.  No overt s/sx of aspiration when given trials of thin liquids and puree following aspiration precautions (sitting upright, taking small sips/bites, reducing distractions). No oral phase deficits noted. Pt education provided on aspiration precautions and the importance of taking small sips/ bites and frequent rest breaks d/t pt's dx of GERD per chart review.  Recommend dysphagia level 2 diet of chopped foods d/t pt's lack of dentition (pt stated not having dentition for the past 20 years and not wanting dentures) with thin liquids following general aspiration precautions. Skilled ST services to f/u for pt's diet toleration and pt/family education. Recommend dietician consult for adeqaute nutrional intake.        Aspiration Risk   (reduced)    Diet Recommendation   Dysphagia level 2 diet (chopped) with thin liquids following aspiration precautions.   Medication Administration: Whole meds with liquid (with puree is requested/needed)    Other  Recommendations Recommended Consults:  (dietician ) Oral Care Recommendations: Oral care QID;Patient independent  with oral care   Follow up Recommendations  (TBD)      Frequency and Duration min 2x/week  1 week       Prognosis Prognosis for Safe Diet Advancement: Good      Swallow Study   General Date of Onset: 06/07/16 HPI: Pt  is a 76 y.o. male  with a known history of Osteoarthritis, atrial fibrillation, COPD, esophageal carcinoma, essential hypertension, GERD who is presenting to the ER with complaint of generalized weakness and cough congestion and shortness of breath. Patient is a very poor historian he states that he has been feeling like this for the past few days. He has not had any fevers or chills. Denies any chest palpitation or palpitations. Patient in emergency room has noted to have a heart rate that is intermittently elevated with A. fib RVR.. Currently pt is very talkative and able to converse at conversational level. Pt is in plesant mood but repeated wanting to go home today.  Type of Study: Bedside Swallow Evaluation Previous Swallow Assessment: none noted Diet Prior to this Study: Regular;Thin liquids Temperature Spikes Noted: No Respiratory Status: Room air History of Recent Intubation: No Behavior/Cognition: Alert;Cooperative;Pleasant mood Oral Cavity Assessment: Within Functional Limits Oral Care Completed by SLP: Recent completion by staff Oral Cavity - Dentition: Missing dentition (No dentition for past 20 years per pt reports) Vision: Functional for self-feeding Self-Feeding Abilities: Able to feed self;Needs assist;Needs set up Patient Positioning: Upright in bed Baseline Vocal Quality: Normal;Low vocal intensity;Breathy Volitional Cough:  (dnt) Volitional Swallow:  (dnt)    Oral/Motor/Sensory Function Overall Oral Motor/Sensory Function: Within functional limits   Ice Chips Ice chips: Not tested   Thin Liquid Thin Liquid: Within functional limits Presentation: Cup;Self Fed (X10+ trials)    Nectar Thick Nectar Thick Liquid: Not tested   Honey Thick Honey Thick Liquid: Not tested   Puree Puree: Within functional limits Presentation: Self Fed;Spoon (X10+ trials )   Solid   GO   Solid: Not tested    Functional Assessment Tool Used: clinical judgement Functional Limitations: Swallowing Swallow  Current Status [id number]): At least 1 percent but less than 20 percent impaired, limited or restricted Swallow Goal Status [id number]): At least 1 percent but less than 20 percent impaired, limited or restricted Swallow Discharge Status [phone]): At least 1 percent but less than 20 percent impaired, limited or restricted   Eulogio Ditch 06/08/2016,3:14 PM   This information has been reviewed and agreed upon by this supervising clinician.  Orinda Kenner, [place], CCC-SLP

## 2016-06-08 NOTE — Progress Notes (Signed)
Several Family members have contacted me to report concern that patient should not go back his current living situation. They report that it is not a safe environment for him to be in and that he lacks the care that he needs. Case management and Social Work and Dr. Leslye Peer notified of these concerns.   Patient alert and oriented and states that he wants to go back home. He does not understand why he is still here and thinks that he is safe to go home.

## 2016-06-08 NOTE — Care Management (Signed)
Spoke with patient, daughter in law and son Jeremy Gordon.  Patient's wife is currently with her sister and Jeremy Gordon is completing an application for long term care medicaid for her to go to [facility].  Patient has medicaid also. Patient is firm in stating he will go back to his son Jeremy Gordon' home in [place].  Discussed the concerns of family members regarding having his care needs met.  Patient minimizes the concerns.  He does admit to significant pain in his right hip. Had a previous hip fracture with surgical repair.  He says he fell on this hip last week and has had trouble ambulating since.  "I thought I broke it again."  It does not appear that patient has informed any of the medical staff of this pain.  Still waiting for physical therapy evaluation.  Patient declined treatment this morning.  If snf is recommended, patient would be agreeable to go.  He is also agreeable to home with home health.  Discussed SN PT OT Aide and social work.  Have also discussed the need for home assessment by dept of social services. Updated csw about possible need for aps referral.  Have given heads up referral referral to [facility] for home health.  Attending informed of fall and pain and pending physical therapy eval.

## 2016-06-08 NOTE — Care Management (Signed)
Patient placed in observation for shortness of breath. Found to have questionable bronchitis and atrial fib with RVR.  Patient lives next door to his sister in law.  Says his wife has brain cancer and has chosen not to pursue any treatment. Says he is going to take her to [facility] today to stay.  Patient says when he leaves here he is going to the hospice home because "you can stay there for nothing. "I have cancer everywhere."  He points to his stomach when describing his cancer.  He did not allow that he is currently undergoing chemotherapy. CM did explain that it is unlikely that he would meet criteria for hospice home.   Agency of choice would be [facility].  Patient and his brother requests to speak with the liaison for [facility].  He also allows he may go to [facility] because- "it will only cost my social security."

## 2016-06-08 NOTE — Evaluation (Signed)
Physical Therapy Evaluation Patient Details Name: Jeremy Gordon MRN: [id number] DOB: [birth year] Today's Date: 06/08/2016   History of Present Illness  presented to ER secondary to generalized weakness, cough and SOB; admitted with bronchitis, possible PNA and afib with RVR  Clinical Impression  Upon evaluation, patient alert and oriented to basic information; follows simple commands and demonstrates fair insight.  Bilat UE/LE generally weak and deconditioned, but functional for basic transfers and mobility.  Significant weakness of R posterior/lateral hip musculature evident (likely residual from previous R hip ORIF, 01/2016).  Currently requiring min assist for sit/stand with RW and min/mod assist for basic transfers, short-distance gait (25') with RW.  Very narrow, near-tandem BOS at times with consistent cuing/assist from therapist to recover balance throughout distance.  Unsafe to attempt without RW and +1 assist at all times; unsafe to return home alone for any period of time. Would benefit from skilled PT to address above deficits and promote optimal return to PLOF; recommend transition to STR upon discharge from acute hospitalization.     Follow Up Recommendations SNF    Equipment Recommendations       Recommendations for Other Services       Precautions / Restrictions Precautions Precautions: Fall Precaution Comments: R chest port Restrictions Weight Bearing Restrictions: No      Mobility  Bed Mobility Overal bed mobility: Needs Assistance Bed Mobility: Supine to Sit     Supine to sit: Modified independent (Device/Increase time)     General bed mobility comments: increased time, heavy use of bedrails required to complete  Transfers Overall transfer level: Needs assistance Equipment used: Rolling walker (2 wheeled) Transfers: Sit to/from Stand Sit to Stand: Min assist            Ambulation/Gait Ambulation/Gait assistance: Min assist;Mod assist Ambulation  Distance (Feet): 25 Feet Assistive device: Rolling walker (2 wheeled)       General Gait Details: very narrowed, near-tandem BOS at times; significant R posterior/lateral hip weakness resulting in significantly antalgic gait pattern.  Forward flexed posture.  Poor balance,. Unsafe to attempt without RW and +1 assist at this time.  Stairs            Wheelchair Mobility    Modified Rankin (Stroke Patients Only)       Balance Overall balance assessment: Needs assistance Sitting-balance support: No upper extremity supported;Feet supported Sitting balance-Leahy Scale: Good     Standing balance support: Bilateral upper extremity supported Standing balance-Leahy Scale: Fair                               Pertinent Vitals/Pain Pain Assessment: Faces Faces Pain Scale: Hurts a little bit Pain Location: R knee, chronic arthritic pain Pain Descriptors / Indicators: Aching Pain Intervention(s): Limited activity within patient's tolerance;Monitored during session;Repositioned    Home Living Family/patient expects to be discharged to:: Private residence Living Arrangements: Children (patient has been living with son and daughter in law; home alone during day) Available Help at Discharge: Family;Available PRN/intermittently Type of Home: House Home Access: Stairs to enter Entrance Stairs-Rails: Chemical engineer of Steps: 8 Home Layout: One level Home Equipment: Environmental consultant - 2 wheels;Walker - 4 wheels;Crutches;Cane - single point;Wheelchair - manual Additional Comments: pt. reports he owns multiple AD's but does not use a specific one regularly.     Prior Function Level of Independence: Independent with assistive device(s)         Comments: Mod indep for ADLs,  household mobility; does endorse multiple fall history, up to 3 in recent 6 months     Hand Dominance   Dominant Hand: Right    Extremity/Trunk Assessment   Upper Extremity  Assessment Upper Extremity Assessment: Generalized weakness (elevation generally limited to shoulder height only (chronic, arthritic changeS))    Lower Extremity Assessment Lower Extremity Assessment: Generalized weakness (grossly at least 4-/5 throughout)       Communication   Communication: HOH  Cognition Arousal/Alertness: Awake/alert Behavior During Therapy: WFL for tasks assessed/performed Overall Cognitive Status: Within Functional Limits for tasks assessed                      General Comments      Exercises     Assessment/Plan    PT Assessment Patient needs continued PT services  PT Problem List Decreased strength;Decreased range of motion;Decreased activity tolerance;Decreased balance;Decreased mobility;Decreased knowledge of use of DME;Decreased safety awareness;Decreased knowledge of precautions;Cardiopulmonary status limiting activity       PT Treatment Interventions DME instruction;Gait training;Stair training;Functional mobility training;Therapeutic activities;Therapeutic exercise;Balance training;Patient/family education    PT Goals (Current goals can be found in the Care Plan section)  Acute Rehab PT Goals Patient Stated Goal: per patient, to go back home; per son, to consider rehab PT Goal Formulation: With patient Time For Goal Achievement: 06/22/16 Potential to Achieve Goals: Good    Frequency Min 2X/week   Barriers to discharge Decreased caregiver support      Co-evaluation               End of Session Equipment Utilized During Treatment: Gait belt Activity Tolerance: Patient tolerated treatment well;Patient limited by fatigue Patient left: in chair;with call bell/phone within reach;with chair alarm set Nurse Communication: Mobility status PT Visit Diagnosis: Muscle weakness (generalized) (M62.81);Difficulty in walking, not elsewhere classified (R26.2)    Functional Assessment Tool Used: AM-PAC 6 Clicks Basic Mobility Functional  Limitation: Mobility: Walking and moving around Mobility: Walking and Moving Around Current Status [id number]): At least 40 percent but less than 60 percent impaired, limited or restricted Mobility: Walking and Moving Around Goal Status [phone]): At least 1 percent but less than 20 percent impaired, limited or restricted    Time: [id number] PT Time Calculation (min) (ACUTE ONLY): 30 min   Charges:   PT Evaluation $PT Eval Low Complexity: 1 Procedure     PT G Codes:   PT G-Codes **NOT FOR INPATIENT CLASS** Functional Assessment Tool Used: AM-PAC 6 Clicks Basic Mobility Functional Limitation: Mobility: Walking and moving around Mobility: Walking and Moving Around Current Status [id number]): At least 40 percent but less than 60 percent impaired, limited or restricted Mobility: Walking and Moving Around Goal Status [phone]): At least 1 percent but less than 20 percent impaired, limited or restricted    Dvontae Ruan H. Owens Shark, PT, DPT, NCS 06/08/16, 5:21 PM [phone]

## 2016-06-08 NOTE — Care Management Obs Status (Signed)
[place] NOTIFICATION   Patient Details  Name: Jeremy Gordon MRN: [id number] Date of Birth: 10-May-1940   Medicare Observation Status Notification Given:  Yes    Katrina Stack, RN 06/08/2016, 10:10 AM

## 2016-06-09 ENCOUNTER — Ambulatory Visit
Admit: 2016-06-09 | Discharge: 2016-06-09 | Disposition: A | Discharge status: Home / Self Care | Payer: Medicare Other | Attending: Radiation Oncology | Admitting: Radiation Oncology

## 2016-06-09 ENCOUNTER — Ambulatory Visit: Payer: Medicare Other

## 2016-06-09 ENCOUNTER — Observation Stay: Payer: Medicare Other

## 2016-06-09 DIAGNOSIS — R531 Weakness: Secondary | ICD-10-CM | POA: Diagnosis not present

## 2016-06-09 DIAGNOSIS — C155 Malignant neoplasm of lower third of esophagus: Secondary | ICD-10-CM | POA: Diagnosis not present

## 2016-06-09 LAB — PROTIME-INR
INR: 2.03
Prothrombin Time: 23.3 seconds — ABNORMAL HIGH (ref 11.4–15.2)

## 2016-06-09 MED ORDER — PREDNISONE 5 MG PO TABS: ORAL_TABLET | ORAL | 0 refills | Status: DC

## 2016-06-09 MED ORDER — ALBUTEROL SULFATE (2.5 MG/3ML) 0.083% IN NEBU: 3.0000 mL | INHALATION_SOLUTION | Freq: Four times a day (QID) | RESPIRATORY_TRACT | 0 refills | Status: AC

## 2016-06-09 MED ORDER — DILTIAZEM HCL ER COATED BEADS 180 MG PO CP24
180.0000 mg | ORAL_CAPSULE | Freq: Every day | ORAL | Status: DC
Start: 2016-06-09 — End: 2016-06-09
  Administered 2016-06-09: 180 mg via ORAL
  Filled 2016-06-09: qty 1

## 2016-06-09 MED ORDER — MORPHINE SULFATE (CONCENTRATE) 20 MG/ML PO SOLN: 10.0000 mg | Freq: Four times a day (QID) | ORAL | 0 refills | Status: AC | PRN

## 2016-06-09 MED ORDER — WARFARIN SODIUM 5 MG PO TABS: 8.0000 mg | ORAL_TABLET | Freq: Every day | ORAL | Status: DC

## 2016-06-09 MED ORDER — HEPARIN SOD (PORK) LOCK FLUSH 100 UNIT/ML IV SOLN
500.0000 [IU] | Freq: Once | INTRAVENOUS | Status: AC
  Administered 2016-06-09: 500 [IU] via INTRAVENOUS
  Filled 2016-06-09 (×2): qty 5

## 2016-06-09 MED ORDER — DILTIAZEM HCL ER COATED BEADS 180 MG PO CP24: 180.0000 mg | ORAL_CAPSULE | Freq: Every day | ORAL | 0 refills | Status: DC

## 2016-06-09 MED ORDER — DOXYCYCLINE HYCLATE 100 MG PO TABS: 100.0000 mg | ORAL_TABLET | Freq: Two times a day (BID) | ORAL | 0 refills | Status: DC

## 2016-06-09 NOTE — Discharge Instructions (Signed)
Community-Acquired Pneumonia, Adult °Introduction °Pneumonia is an infection of the lungs. One type of pneumonia can happen while a person is in a hospital. A different type can happen when a person is not in a hospital (community-acquired pneumonia). It is easy for this kind to spread from person to person. It can spread to you if you breathe near an infected person who coughs or sneezes. Some symptoms include: °· A dry cough. °· A wet (productive) cough. °· Fever. °· Sweating. °· Chest pain. °Follow these instructions at home: °· Take over-the-counter and prescription medicines only as told by your doctor. °¨ Only take cough medicine if you are losing sleep. °¨ If you were prescribed an antibiotic medicine, take it as told by your doctor. Do not stop taking the antibiotic even if you start to feel better. °· Sleep with your head and neck raised (elevated). You can do this by putting a few pillows under your head, or you can sleep in a recliner. °· Do not use tobacco products. These include cigarettes, chewing tobacco, and e-cigarettes. If you need help quitting, ask your doctor. °· Drink enough water to keep your pee (urine) clear or pale yellow. °A shot (vaccine) can help prevent pneumonia. Shots are often suggested for: °· People older than 78 years of age. °· People older than 78 years of age: °¨ Who are having cancer treatment. °¨ Who have long-term (chronic) lung disease. °¨ Who have problems with their body's defense system (immune system). °You may also prevent pneumonia if you take these actions: °· Get the flu (influenza) shot every year. °· Go to the dentist as often as told. °· Wash your hands often. If soap and water are not available, use hand sanitizer. °Contact a doctor if: °· You have a fever. °· You lose sleep because your cough medicine does not help. °Get help right away if: °· You are short of breath and it gets worse. °· You have more chest pain. °· Your sickness gets worse. This is very  serious if: °¨ You are an older adult. °¨ Your body's defense system is weak. °· You cough up blood. °This information is not intended to replace advice given to you by your health care provider. Make sure you discuss any questions you have with your health care provider. °Document Released: 09/21/2007 Document Revised: 09/10/2015 Document Reviewed: 07/30/2014 °© 2017 [employer]vier ° °

## 2016-06-09 NOTE — NC FL2 (Signed)
[place] LEVEL OF CARE SCREENING TOOL     IDENTIFICATION  Patient Name: Jeremy Gordon Birthdate: Jan 23, 1941 Sex: male Admission Date (Current Location): 06/07/2016  [place] and [place] Number:  Engineering geologist and Address:  [facility], [address] [place], [place] [address]      Provider Number: [id number]  Attending Physician Name and Address:  Loletha Grayer, MD  Relative Name and Phone Number:  Colyn, Angrisani [phone]  [phone] or Hall,Shirley Sister [phone] or Zinedine, Majid [phone] or Eldridge, Rielly [phone]     Current Level of Care: Hospital Recommended Level of Care: [place] Prior Approval Number:    Date Approved/Denied:   PASRR Number: [id number]  Discharge Plan: SNF    Current Diagnoses: Patient Active Problem List   Diagnosis Date Noted  . Weakness 06/07/2016  . Goals of care, counseling/discussion 05/16/2016  . Closed right hip fracture ([place]) 01/15/2016  . Malignant neoplasm of lower third of esophagus ([place]) 09/10/2015  . Pneumonitis due to inhalation of food or vomitus ([place]) 08/24/2015  . Persistent atrial fibrillation ([place]) 01/29/2015  . Ventricular tachycardia ([place]) 01/29/2015  . Sepsis ([place]) 01/29/2015  . Essential hypertension 01/29/2015  . Hypokalemia 01/29/2015  . Hypomagnesemia 01/29/2015  . Systemic infection ([place]) 01/29/2015  . Atrial fibrillation ([place])   . Aspiration pneumonia of right lower lobe ([place])   . Pneumonia 01/26/2015  . PNA (pneumonia) 01/26/2015  . Chest wall pain 10/08/2014  . Chest pain 10/08/2014  . Dysphagia 02/05/2014  . Apnea, sleep 10/09/2013  . Carpal tunnel syndrome 03/11/2013  . Sciatica 11/23/2012  . Acid reflux 11/23/2012  . Gastro-esophageal reflux disease without esophagitis 11/23/2012  . 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 Enlarged prostate 10/15/2012  . Allergic rhinitis 08/15/2012  . Back ache  08/15/2012  . Atrial fibrillation, chronic ([place]) 08/15/2012  . CAFL (chronic airflow limitation) ([place]) 08/15/2012  . Breathlessness on exertion 08/15/2012  . BP (high blood pressure) 08/15/2012  . Malaise and fatigue 08/15/2012  . 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 Arthritis of knee, degenerative 08/15/2012  . 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 Lumbar canal stenosis 08/15/2012  . Chronic obstructive pulmonary disease ([place]) 08/15/2012  . Dorsalgia 08/15/2012  . Essential (primary) hypertension 08/15/2012  . Breath shortness 08/15/2012  . Does not feel right 08/15/2012  . Post viral syndrome 08/15/2012  . 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    Orientation RESPIRATION BLADDER Height & Weight     Self, Time, Situation, Place  Normal Continent Weight: 190 lb (86.2 kg) Height:  5\' 7"  (170.2 cm)  BEHAVIORAL SYMPTOMS/MOOD NEUROLOGICAL BOWEL NUTRITION STATUS      Continent Diet (Dysphagia 2 diet)  AMBULATORY STATUS COMMUNICATION OF NEEDS Skin   Limited Assist Verbally Normal                       Personal Care Assistance Level of Assistance  Bathing, Feeding, Dressing Bathing Assistance: Limited assistance Feeding assistance: Independent Dressing Assistance: Limited assistance     Functional Limitations Info  Sight, Hearing, Speech Sight Info: Adequate Hearing Info: Adequate Speech Info: Adequate    SPECIAL CARE FACTORS FREQUENCY  PT (By licensed PT)     PT Frequency: 5x a week              Contractures Contractures Info: Not present    Additional Factors Info  Code Status, Allergies Code Status Info: Full Code Allergies Info: NKA  Current Medications (06/09/2016):  This is the current hospital active medication list Current Facility-Administered Medications  Medication Dose Route Frequency Provider Last Rate Last Dose  . 0.9 %  sodium chloride infusion   Intravenous Continuous Loletha Grayer, MD  30 mL/hr at 06/08/16 1607    . acetaminophen (TYLENOL) tablet 650 mg  650 mg Oral Q6H PRN Dustin Flock, MD       Or  . acetaminophen (TYLENOL) suppository 650 mg  650 mg Rectal Q6H PRN Dustin Flock, MD      . albuterol (PROVENTIL) (2.5 MG/3ML) 0.083% nebulizer solution 3 mL  3 mL Inhalation Q6H PRN Dustin Flock, MD      . budesonide (PULMICORT) nebulizer solution 0.5 mg  0.5 mg Nebulization BID Loletha Grayer, MD   0.5 mg at 06/09/16 0739  . digoxin (LANOXIN) tablet 0.125 mg  0.125 mg Oral Daily Dustin Flock, MD   0.125 mg at 06/08/16 0843  . diltiazem (CARDIZEM CD) 24 hr capsule 180 mg  180 mg Oral Daily Loletha Grayer, MD      . diltiazem (CARDIZEM) injection 10 mg  10 mg Intravenous Q6H PRN Dustin Flock, MD   10 mg at 06/07/16 1758  . doxycycline (VIBRA-TABS) tablet 100 mg  100 mg Oral BID Loletha Grayer, MD   100 mg at 06/08/16 2108  . enalapril (VASOTEC) tablet 20 mg  20 mg Oral Daily Dustin Flock, MD   20 mg at 06/08/16 0843  . finasteride (PROSCAR) tablet 5 mg  5 mg Oral Daily Dustin Flock, MD   5 mg at 06/08/16 0843  . fluticasone (FLONASE) 50 MCG/ACT nasal spray 2 spray  2 spray Each Nare Daily Dustin Flock, MD   2 spray at 06/08/16 1000  . gabapentin (NEURONTIN) capsule 300 mg  300 mg Oral BID Dustin Flock, MD   300 mg at 06/08/16 2108  . ipratropium-albuterol (DUONEB) 0.5-2.5 (3) MG/3ML nebulizer solution 3 mL  3 mL Nebulization Q6H PRN Loletha Grayer, MD      . methylPREDNISolone sodium succinate (SOLU-MEDROL) 125 mg/2 mL injection 60 mg  60 mg Intravenous Daily Loletha Grayer, MD      . metoprolol (LOPRESSOR) tablet 50 mg  50 mg Oral BID Dustin Flock, MD   50 mg at 06/08/16 2108  . morphine (ROXANOL) 20 MG/ML concentrated solution 10 mg  10 mg Oral Q6H PRN Dustin Flock, MD   10 mg at 06/08/16 0428  . ondansetron (ZOFRAN) tablet 4 mg  4 mg Oral Q6H PRN Dustin Flock, MD       Or  . ondansetron (ZOFRAN) injection 4 mg  4 mg Intravenous Q6H PRN Dustin Flock, MD      . oxyCODONE (Oxy IR/ROXICODONE) immediate release tablet 5 mg  5 mg Oral Q6H PRN Loletha Grayer, MD      . pantoprazole (PROTONIX) EC tablet 40 mg  40 mg Oral Daily Dustin Flock, MD   40 mg at 06/08/16 0842  . prochlorperazine (COMPAZINE) tablet 10 mg  10 mg Oral Q6H PRN Dustin Flock, MD      . promethazine (PHENERGAN) suppository 25 mg  25 mg Rectal Q6H PRN Dustin Flock, MD      . senna (SENOKOT) tablet 8.6 mg  1 tablet Oral BID Dustin Flock, MD   8.6 mg at 06/08/16 2108  . sodium chloride flush (NS) 0.9 % injection 3 mL  3 mL Intravenous Q12H Dustin Flock, MD   3 mL at 06/08/16 2108  . tamsulosin (FLOMAX) capsule 0.4 mg  0.4 mg Oral Daily Dustin Flock, MD   0.4 mg at 06/08/16 0842  . Warfarin - Pharmacist Dosing Inpatient   Does not apply q1800 Sheema M Hallaji, RPH       Facility-Administered Medications Ordered in Other Encounters  Medication Dose Route Frequency Provider Last Rate Last Dose  . sodium chloride 0.9 % injection 10 mL  10 mL Intravenous PRN Lloyd Huger, MD   10 mL at 12/23/14 0945  . sodium chloride 0.9 % injection 10 mL  10 mL Intravenous PRN Lloyd Huger, MD   10 mL at 01/15/15 1512  . sodium chloride flush (NS) 0.9 % injection 10 mL  10 mL Intravenous PRN Lloyd Huger, MD   10 mL at 11/10/15 [id number]     Discharge Medications: Please see discharge summary for a list of discharge medications.  Relevant Imaging Results:  Relevant Lab Results:   Additional Information SSN [id number]  Ross Ludwig, [place]

## 2016-06-09 NOTE — Clinical Social Work Note (Signed)
Clinical Social Work Assessment  Patient Details  Name: Jeremy Gordon MRN: [id number] Date of Birth: [birth year]  Date of referral:  06/09/16               Reason for consult:  Facility Placement                Permission sought to share information with:  Facility Sport and exercise psychologist, Family Supports Permission granted to share information::  Yes, Verbal Permission Granted  Name::     Brayven, [place] [phone]  [phone] or Hall,Shirley Sister [phone] or Cheng, Fugitt [phone] or Cecile, [place] [phone]   Agency::  SNF admissions  Relationship::     Contact Information:     Housing/Transportation Living arrangements for the past 2 months:  Single Family Home Source of Information:  Patient Patient Interpreter Needed:  None Criminal Activity/Legal Involvement Pertinent to Current Situation/Hospitalization:  No - Comment as needed Significant Relationships:  Adult Children Lives with:  Adult Children Do you feel safe going back to the place where you live?  No Need for family participation in patient care:  No (Coment)  Care giving concerns:  Patient and family feel he needs short term rehab before he is able to return back home.   Social Worker assessment / plan:  Patient is a 76 year old male who is married and alert and oriented x4.  Patient states he has not been to rehab before.  CSW explained to patient what the process is and what to expect at SNF.  Patient states his wife has brain cancer and she is currently living with her sister, and they are considering having her move to a SNF permanently.  CSW was also informed that patient is currently getting radiation and chemo, at [facility].  Patient states he would like to go to [facility] if possible.  CSW was given permission to begin bed search in [place].  Patient did not express any other questions or concerns.  Employment status:   Retired Nurse, adult PT Recommendations:  [place] / Referral to community resources:     Patient/Family's Response to care:  Patient is in agreement to going to SNF for short term rehab. Patient/Family's Understanding of and Emotional Response to Diagnosis, Current Treatment, and Prognosis: Patient is aware of current prognosis and diagnosis.  Emotional Assessment Appearance:    Attitude/Demeanor/Rapport:    Affect (typically observed):  Calm, Stable Orientation:  Oriented to Self, Oriented to Place, Oriented to  Time, Oriented to Situation Alcohol / Substance use:  Not Applicable Psych involvement (Current and /or in the community):  No (Comment)  Discharge Needs  Concerns to be addressed:  Lack of Support Readmission within the last 30 days:  No Current discharge risk:  None Barriers to Discharge:  No Barriers Identified   Ross Ludwig, LCSWA 06/09/2016, 11:23 AM

## 2016-06-09 NOTE — Progress Notes (Signed)
Patient going to cancer center for radiation treatment this morning. Dr. Leslye Peer aware. Beds do not fit in hallways there so patient is going via wheelchair. Patient is alert and oriented, not impulsive. 2 assist to wheelchair.

## 2016-06-09 NOTE — Discharge Summary (Signed)
[facility] [place] NAME: Jeremy Gordon    MR#:  [id number]  DATE OF BIRTH:  11-Nov-1940  DATE OF ADMISSION:  06/07/2016 ADMITTING PHYSICIAN: Dustin Flock, MD  DATE OF DISCHARGE: 06/09/2016  PRIMARY CARE PHYSICIAN: [name], MD    ADMISSION DIAGNOSIS:  Shortness of breath [R06.02] Atrial fibrillation with RVR ([place]) [I48.91] HCAP (healthcare-associated pneumonia) [J18.9] Non-intractable vomiting with nausea, unspecified vomiting type [R11.2]  DISCHARGE DIAGNOSIS:  Active Problems:   Weakness   SECONDARY DIAGNOSIS:   Past Medical History:  Diagnosis Date  . Arthritis   . Atrial fibrillation ([place])   . COPD (chronic obstructive pulmonary disease) ([place])   . Dysrhythmia   . Esophageal carcinoma ([place])    a. Chemo: FOLFIRI  . Essential hypertension   . GERD (gastroesophageal reflux disease)   . History of stress test    a. 06/2003 MV: EF 56%, no ischemia/infarct.  . Persistent atrial fibrillation ([place])    a. 07/2013 Echo: EF 60-65%, mod dil LA, mild TR;  CHA2DS2VASc = 3-->chronic coumadin.  . Sleep apnea     HOSPITAL COURSE:   1. COPD exacerbation with possible pneumonia. Patient was given Solu-Medrol during the hospital course and flip over to prednisone taper upon discharge. Continue nebulizer treatments. Doxycycline prescribed. 2. Atrial fibrillation with rapid ventricular response. Continue Cardizem, metoprolol and digoxin. I increased the Cardizem a little bit during the hospital course. Patient is also on Coumadin. Check an INR on a weekly basis. 3. Weakness and fall out of bed. X-rays negative. Physical therapy recommended rehabilitation. 4. Esophageal cancer with hilar metastases. Patient has been undergoing chemotherapy and radiation. Patient is considering hospice care. Positive care consultation at facility. Continue radiation therapy daily during the week for completion of course. Follow-up with Dr. Grayland Ormond in a couple  weeks for further discussions on prognosis and future course. 5. GERD on omeprazole 6. BPH on finasteride and Flomax 7. Essential hypertension continue usual medications 8. Pancytopenia recommend CBC weekly basis with results to Dr. Grayland Ormond. Steroids should help increase white count  DISCHARGE CONDITIONS:   Guarded  CONSULTS OBTAINED:   none  DRUG ALLERGIES:  No Known Allergies  DISCHARGE MEDICATIONS:   Current Discharge Medication List    START taking these medications   Details  albuterol (PROVENTIL) (2.5 MG/3ML) 0.083% nebulizer solution Inhale 3 mLs into the lungs every 6 (six) hours. Qty: 75 mL, Refills: 0    doxycycline (VIBRA-TABS) 100 MG tablet Take 1 tablet (100 mg total) by mouth 2 (two) times daily. Qty: 14 tablet, Refills: 0    predniSONE (DELTASONE) 5 MG tablet 5 tabs po day1; 4 tabs po day2; 3 tabs po day3; 2 tabs po day4; 1 tab po day5 Qty: 15 tablet, Refills: 0      CONTINUE these medications which have CHANGED   Details  diltiazem (CARDIZEM CD) 180 MG 24 hr capsule Take 1 capsule (180 mg total) by mouth daily. Qty: 30 capsule, Refills: 0    morphine (ROXANOL) 20 MG/ML concentrated solution Take 0.5 mLs (10 mg total) by mouth every 6 (six) hours as needed for severe pain. Qty: 30 mL, Refills: 0   Associated Diagnoses: Malignant neoplasm of lower third of esophagus ([place])      CONTINUE these medications which have NOT CHANGED   Details  enalapril (VASOTEC) 20 MG tablet Take 20 mg by mouth daily.     finasteride (PROSCAR) 5 MG tablet Take 5 mg by mouth daily.    fluticasone (  FLONASE) 50 MCG/ACT nasal spray ONE SPRAY IN EACH NOSTRIL AT NIGHT AS DIRECTED Refills: 11    gabapentin (NEURONTIN) 300 MG capsule Take 300 mg by mouth 2 (two) times daily.     metoprolol (LOPRESSOR) 50 MG tablet Take 50 mg by mouth 2 (two) times daily.     omeprazole (PRILOSEC) 20 MG capsule Take 1 capsule (20 mg total) by mouth daily. Qty: 30 capsule, Refills: 2     ondansetron (ZOFRAN) 4 MG tablet Take 1 tablet (4 mg total) by mouth every 8 (eight) hours as needed for nausea or vomiting. Qty: 10 tablet, Refills: 0    prochlorperazine (COMPAZINE) 10 MG tablet Take 1 tablet (10 mg total) by mouth every 6 (six) hours as needed for nausea or vomiting. Qty: 90 tablet, Refills: 5   Associated Diagnoses: Malignant neoplasm of lower third of esophagus ([facility])    promethazine (PHENERGAN) 25 MG suppository Place 1 suppository (25 mg total) rectally every 6 (six) hours as needed for nausea or vomiting. Qty: 20 suppository, Refills: 2   Associated Diagnoses: Malignant neoplasm of lower third of esophagus ([facility])    senna (SENOKOT) 8.6 MG TABS tablet Take 1 tablet (8.6 mg total) by mouth 2 (two) times daily. Qty: 14 each, Refills: 0    tamsulosin (FLOMAX) 0.4 MG CAPS capsule Take 0.4 mg by mouth daily. Refills: 3    warfarin (COUMADIN) 4 MG tablet Take 2 tablets by mouth with evening meal    digoxin (LANOXIN) 0.125 MG tablet Take 0.125 mg by mouth daily.     fluticasone (FLOVENT HFA) 220 MCG/ACT inhaler Inhale into the lungs.      STOP taking these medications     albuterol (PROVENTIL HFA;VENTOLIN HFA) 108 (90 Base) MCG/ACT inhaler      furosemide (LASIX) 20 MG tablet          DISCHARGE INSTRUCTIONS:   Follow-up with Dr. At  rehabilitation 1 day. Follow-up with radiation oncology daily during the week Follow-up with oncology 2 weeks  If you experience worsening of your admission symptoms, develop shortness of breath, life threatening emergency, suicidal or homicidal thoughts you must seek medical attention immediately by calling 911 or calling your MD immediately  if symptoms less severe.  You Must read complete instructions/literature along with all the possible adverse reactions/side effects for all the Medicines you take and that have been prescribed to you. Take any new Medicines after you have completely understood and accept all the possible  adverse reactions/side effects.   Please note  You were cared for by a hospitalist during your hospital stay. If you have any questions about your discharge medications or the care you received while you were in the hospital after you are discharged, you can call the unit and asked to speak with the hospitalist on call if the hospitalist that took care of you is not available. Once you are discharged, your primary care physician will handle any further medical issues. Please note that NO REFILLS for any discharge medications will be authorized once you are discharged, as it is imperative that you return to your primary care physician (or establish a relationship with a primary care physician if you do not have one) for your aftercare needs so that they can reassess your need for medications and monitor your lab values.    Today   CHIEF COMPLAINT:   Chief Complaint  Patient presents with  . Weakness    HISTORY OF PRESENT ILLNESS:  Jeremy Gordon  is a  76 y.o. male with a known history of Esophageal cancer presenting with weakness and fall out of bed   VITAL SIGNS:  Blood pressure 132/79, pulse 86, temperature 97.9 F (36.6 C), temperature source Oral, resp. rate 18, height 5\' 7"  (1.702 m), weight 86.2 kg (190 lb), SpO2 91 %.   PHYSICAL EXAMINATION:  GENERAL:  76 y.o.-year-old patient lying in the bed with no acute distress.  EYES: Pupils equal, round, reactive to light and accommodation. No scleral icterus. Extraocular muscles intact.  HEENT: Head atraumatic, normocephalic. Oropharynx and nasopharynx clear.  NECK:  Supple, no jugular venous distention. No thyroid enlargement, no tenderness.  LUNGS: Decreased breath sounds bilaterally, no wheezing, rales,rhonchi or crepitation. No use of accessory muscles of respiration.  CARDIOVASCULAR: S1, S2 normal. No murmurs, rubs, or gallops.  ABDOMEN: Soft, non-tender, non-distended. Bowel sounds present. No organomegaly or mass.  EXTREMITIES:  No pedal edema, cyanosis, or clubbing.  NEUROLOGIC: Cranial nerves II through XII are intact. Muscle strength 5/5 in all extremities. Sensation intact. Gait not checked.  PSYCHIATRIC: The patient is alert and oriented x 3.  SKIN: No obvious rash, lesion, or ulcer.   DATA REVIEW:   CBC  Recent Labs Lab 06/08/16 0615  WBC 1.5*  HGB 10.1*  HCT 28.5*  PLT 85*    Chemistries   Recent Labs Lab 06/07/16 1256 06/08/16 0615  NA 133* 134*  K 3.3* 3.3*  CL 99* 104  CO2 28 23  GLUCOSE 91 93  BUN 9 7  CREATININE 0.61 0.71  CALCIUM 8.0* 7.8*  AST 20  --   ALT 13*  --   ALKPHOS 72  --   BILITOT 1.1  --     Cardiac Enzymes  Recent Labs Lab 06/07/16 1256  TROPONINI 0.03*    Microbiology Results  Results for orders placed or performed during the hospital encounter of 06/07/16  Blood Culture (routine x 2)     Status: None (Preliminary result)   Collection Time: 06/07/16 12:56 PM  Result Value Ref Range Status   Specimen Description BLOOD R CHEST PORT  Final   Special Requests BOTTLES DRAWN AEROBIC AND ANAEROBIC BCHV  Final   Culture NO GROWTH 2 DAYS  Final   Report Status PENDING  Incomplete  Blood Culture (routine x 2)     Status: None (Preliminary result)   Collection Time: 06/07/16 12:57 PM  Result Value Ref Range Status   Specimen Description BLOOD R AC  Final   Special Requests BOTTLES DRAWN AEROBIC AND ANAEROBIC [place]  Final   Culture NO GROWTH 2 DAYS  Final   Report Status PENDING  Incomplete  Urine culture     Status: Abnormal   Collection Time: 06/07/16  2:50 PM  Result Value Ref Range Status   Specimen Description URINE, RANDOM  Final   Special Requests NONE  Final   Culture MULTIPLE SPECIES PRESENT, SUGGEST RECOLLECTION (A)  Final   Report Status 06/08/2016 FINAL  Final    RADIOLOGY:  Dg Chest 2 View  Result Date: 06/07/2016 CLINICAL DATA:  76 year old male with shortness breath for 4 days. Symptoms started after radiation therapy treatment for  esophageal cancer. Initial encounter. EXAM: CHEST  2 VIEW COMPARISON:  01/15/2016 01/26/2015 chest x-ray. FINDINGS: Mild progression of parenchymal changes left lung base. Question post therapy changes versus infiltrate. Central pulmonary vascular prominence stable. Right central line tip distal superior vena cava. No plain film evidence of pulmonary metastatic disease. Cardiomegaly. Calcified aorta. Shoulder joint degenerative changes greater on the  right. IMPRESSION: Mild progression of parenchymal changes left lung base. Question post therapy changes versus infiltrate. Electronically Signed   By: Genia Del M.D.   On: 06/07/2016 14:43   Dg Knee 1-2 Views Right  Result Date: 06/09/2016 CLINICAL DATA:  Pain following fall EXAM: RIGHT KNEE - 1-2 VIEW COMPARISON:  January 15, 2016 FINDINGS: Frontal and lateral views were obtained it. There is no fracture or dislocation. No appreciable joint effusion. There is moderately severe joint space narrowing medially with moderate narrowing laterally and in the patellofemoral joint region. There is spurring in all compartments. No erosive change. IMPRESSION: Osteoarthritic change, most marked medially. No acute fracture or joint effusion. Electronically Signed   By: Lowella Grip III M.D.   On: 06/09/2016 09:22   Dg Femur Min 2 Views Left  Result Date: 06/09/2016 CLINICAL DATA:  Pain following fall EXAM: LEFT FEMUR 2 VIEWS COMPARISON:  None. FINDINGS: Frontal and lateral views were obtained. There is no acute fracture or dislocation. There is moderate osteoarthritic change in the left hip joint. There is no knee joint effusion. There is atherosclerotic calcification in the superficial femoral artery. IMPRESSION: Osteoarthritic change left hip joint. No acute fracture or dislocation. Superficial femoral artery atherosclerosis. Electronically Signed   By: Lowella Grip III M.D.   On: 06/09/2016 09:21     Management plans discussed with the patient,  family and they are in agreement.  CODE STATUS:     Code Status Orders        Start     Ordered   06/07/16 1802  Full code  Continuous     06/07/16 1802    Code Status History    Date Active Date Inactive Code Status Order ID Comments User Context   01/17/2016 10:54 AM 01/19/2016  8:02 PM Full Code [id number]  Earnestine Leys, MD Inpatient   01/15/2016  3:36 PM 01/15/2016 10:25 PM Full Code [id number]  Demetrios Loll, MD Inpatient   01/26/2015  6:21 PM 02/02/2015  6:18 PM Full Code [id number]  Vaughan Basta, MD ED      TOTAL TIME TAKING CARE OF THIS PATIENT: 35 minutes.    Loletha Grayer M.D on 06/09/2016 at 9:53 AM  Between 7am to 6pm - Pager - [phone]  After 6pm go to [email or web address] - password [email or web address]  [employer] Office  [phone]  CC: Primary care physician; [name], MD

## 2016-06-09 NOTE — Progress Notes (Signed)
ANTICOAGULATION CONSULT NOTE - Initial Consult  Pharmacy Consult for warfarin dosing Indication: atrial fibrillation  No Known Allergies  Patient Measurements: Height: 5\' 7"  (170.2 cm) Weight: 190 lb (86.2 kg) IBW/kg (Calculated) : 66.1 Heparin Dosing Weight:  Vital Signs: Temp: 97.3 F (36.3 C) (02/22 1124) Temp Source: Oral (02/22 1124) BP: 124/72 (02/22 1124) Pulse Rate: 105 (02/22 1124)  Labs:  Recent Labs  06/07/16 1256 06/07/16 1651 06/08/16 0615 06/09/16 0322  HGB 10.7*  --  10.1*  --   HCT 31.6*  --  28.5*  --   PLT 95*  --  85*  --   LABPROT  --  21.8* 21.8* 23.3*  INR  --  1.87 1.87 2.03  CREATININE 0.61  --  0.71  --   TROPONINI 0.03*  --   --   --     Estimated Creatinine Clearance: 81 mL/min (by C-G formula based on SCr of 0.71 mg/dL).   Medical History: Past Medical History:  Diagnosis Date  . Arthritis   . Atrial fibrillation ([place])   . COPD (chronic obstructive pulmonary disease) ([place])   . Dysrhythmia   . Esophageal carcinoma ([place])    a. Chemo: FOLFIRI  . Essential hypertension   . GERD (gastroesophageal reflux disease)   . History of stress test    a. 06/2003 MV: EF 56%, no ischemia/infarct.  . Persistent atrial fibrillation ([place])    a. 07/2013 Echo: EF 60-65%, mod dil LA, mild TR;  CHA2DS2VASc = 3-->chronic coumadin.  . Sleep apnea     Medications:  Scheduled:  . budesonide (PULMICORT) nebulizer solution  0.5 mg Nebulization BID  . digoxin  0.125 mg Oral Daily  . diltiazem  180 mg Oral Daily  . doxycycline  100 mg Oral BID  . enalapril  20 mg Oral Daily  . finasteride  5 mg Oral Daily  . fluticasone  2 spray Each Nare Daily  . gabapentin  300 mg Oral BID  . methylPREDNISolone (SOLU-MEDROL) injection  60 mg Intravenous Daily  . metoprolol  50 mg Oral BID  . pantoprazole  40 mg Oral Daily  . senna  1 tablet Oral BID  . sodium chloride flush  3 mL Intravenous Q12H  . tamsulosin  0.4 mg Oral Daily  . Warfarin - Pharmacist Dosing  Inpatient   Does not apply q1800    Assessment: Patient w/ h/o esophageal cancer s/p chemo admitted for SOB, cough -- patient has h/o afib and is being anticoagulated w/ warfarin.  Patient's home warfarin dose is 2 tablets of 4 mg daily (8 mg total daily).  DATE  INR  DOSE 2/20  1.87  6mg  2/21  1.87  8mg  2/22  2.03    8mg    Goal of Therapy:  INR 2-3 Monitor platelets by anticoagulation protocol: Yes   Plan:  Will continue patients home dose of 8mg  daily. Monitor INR daily during admission while on Abx .  Continue to monitor H&H and platelets  Thank you for this consult.  Pernell Dupre, PharmD, BCPS Clinical Pharmacist 06/09/2016 11:45 AM

## 2016-06-09 NOTE — Clinical Social Work Note (Signed)
Patient to be d/c'ed today to [facility].  Patient and family agreeable to plans will transport via ems RN to call report to [phone] room [phone].  Evette Cristal, MSW, [place]

## 2016-06-09 NOTE — Progress Notes (Signed)
Patient discharging to SNF. Report called to [place] at [facility]. EMS here for transport. Packet prepared by CSW, Randall Hiss. IVs and tele removed prior to leaving.

## 2016-06-09 NOTE — Plan of Care (Signed)
Problem: Respiratory: Goal: Respiratory status will improve Outcome: Progressing On room air, breathing improved

## 2016-06-09 NOTE — Clinical Social Work Note (Signed)
CSW presented bed offers to patient and he chose [facility], [facility]W contacted [facility] and they can accept patient today.  Jones Broom. Donovan, MSW, [place]  06/09/2016 11:28 AM

## 2016-06-09 NOTE — Clinical Social Work Placement (Signed)
   CLINICAL SOCIAL WORK PLACEMENT  NOTE  Date:  06/09/2016  Patient Details  Name: Jeremy Gordon MRN: [id number] Date of Birth: January 07, 1941  Clinical Social Work is seeking post-discharge placement for this patient at the [place] level of care (*CSW will initial, date and re-position this form in  chart as items are completed):  Yes   Patient/family provided with [place] Work Department's list of facilities offering this level of care within the geographic area requested by the patient (or if unable, by the patient's family).  Yes   Patient/family informed of their freedom to choose among providers that offer the needed level of care, that participate in Medicare, Medicaid or managed care program needed by the patient, have an available bed and are willing to accept the patient.  Yes   Patient/family informed of [place]'s ownership interest in [facility] and [facility], as well as of the fact that they are under no obligation to receive care at these facilities.  PASRR submitted to EDS on 06/09/16     PASRR number received on       Existing PASRR number confirmed on 06/09/16     FL2 transmitted to all facilities in geographic area requested by pt/family on 06/09/16     FL2 transmitted to all facilities within larger geographic area on       Patient informed that his/her managed care company has contracts with or will negotiate with certain facilities, including the following:        Yes   Patient/family informed of bed offers received.  Patient chooses bed at [facility]     Physician recommends and patient chooses bed at      Patient to be transferred to [facility] on 06/09/16.  Patient to be transferred to facility by [facility]     Patient family notified on 06/09/16 of transfer.  Name of family member notified:  Patient to notify his family     PHYSICIAN       Additional Comment:     _______________________________________________ Ross Ludwig, [place] 06/09/2016, 11:28 AM

## 2016-06-10 ENCOUNTER — Ambulatory Visit
Admission: RE | Admit: 2016-06-10 | Discharge: 2016-06-10 | Disposition: A | Discharge status: Home / Self Care | Payer: Medicare Other | Source: Ambulatory Visit | Attending: Radiation Oncology | Admitting: Radiation Oncology

## 2016-06-10 ENCOUNTER — Ambulatory Visit: Payer: Medicare Other

## 2016-06-10 DIAGNOSIS — C155 Malignant neoplasm of lower third of esophagus: Secondary | ICD-10-CM | POA: Diagnosis not present

## 2016-06-12 ENCOUNTER — Ambulatory Visit: Payer: Medicare Other

## 2016-06-12 LAB — CULTURE, BLOOD (ROUTINE X 2)
CULTURE: NO GROWTH
Culture: NO GROWTH

## 2016-06-13 ENCOUNTER — Ambulatory Visit: Payer: Medicare Other

## 2016-06-13 ENCOUNTER — Ambulatory Visit
Admission: RE | Admit: 2016-06-13 | Discharge: 2016-06-13 | Disposition: A | Discharge status: Home / Self Care | Payer: Medicare Other | Source: Ambulatory Visit | Attending: Radiation Oncology | Admitting: Radiation Oncology

## 2016-06-13 DIAGNOSIS — C155 Malignant neoplasm of lower third of esophagus: Secondary | ICD-10-CM | POA: Diagnosis not present

## 2016-06-13 NOTE — Progress Notes (Deleted)
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  INTERVAL HISTORY: Patient returns to clinic today for further evaluation and consideration of cycle 3 of weekly carboplatinum and Taxol along with his concurrent XRT. His performance status is declining. Though he states he is able to swallow better and has decreased nausea and vomiting. He continues to refuse PEG tube. He also continues to lose weight. He continues to have occasional falls. He has no neurologic complaints. He denies any shortness of breath or chest pain. He denies any constipation or diarrhea. He has no melena or hematochezia. He has no urinary complaints. Patient offers no further specific complaints today.  REVIEW OF SYSTEMS:   Review of Systems  Constitutional: Negative for fever, malaise/fatigue and weight loss.  Respiratory: Negative.  Negative for shortness of breath.   Cardiovascular: Negative for chest pain and leg swelling.  Gastrointestinal: Positive for heartburn, nausea and vomiting. Negative for abdominal pain, blood in stool and melena.  Genitourinary: Negative.   Musculoskeletal: Positive for joint pain.  Neurological: Negative.  Negative for weakness.  Endo/Heme/Allergies: Does not bruise/bleed easily.  Psychiatric/Behavioral: Negative.  The patient is not nervous/anxious.    As per HPI. Otherwise, a complete review of systems is negative.   PAST MEDICAL HISTORY:  Hypertension, sleep apnea, atrial fibrillation.  PAST SURGICAL HISTORY: Negative.  FAMILY HISTORY: Reviewed and unchanged. No report of malignancy or chronic disease.     ADVANCED DIRECTIVES:     HEALTH MAINTENANCE: Social History  Substance Use Topics  . Smoking status: Current Some Day Smoker    Years: 62.00  . Smokeless tobacco: Current User    Types: Snuff  . Alcohol use No     No Known Allergies  Current Outpatient Prescriptions  Medication Sig Dispense Refill  . albuterol (PROVENTIL) (2.5 MG/3ML) 0.083% nebulizer solution Inhale 3 mLs into the lungs every 6 (six) hours. 75 mL 0  . digoxin (LANOXIN) 0.125 MG tablet Take 0.125 mg by mouth daily.     Marland Kitchen diltiazem (CARDIZEM CD) 180 MG 24 hr capsule Take 1 capsule (180 mg total) by mouth daily. 30 capsule 0  . doxycycline (VIBRA-TABS) 100 MG tablet Take 1 tablet (100 mg total) by mouth 2 (two) times daily. 14 tablet 0  . enalapril (VASOTEC) 20 MG tablet Take 20 mg by mouth daily.     . finasteride (PROSCAR) 5 MG tablet Take 5 mg by mouth daily.    . fluticasone (FLONASE) 50 MCG/ACT nasal spray ONE SPRAY IN EACH NOSTRIL AT NIGHT AS DIRECTED  11  . fluticasone (FLOVENT HFA) 220 MCG/ACT inhaler Inhale into the lungs.    . gabapentin (NEURONTIN) 300 MG capsule Take 300 mg by mouth 2 (two) times daily.     . metoprolol (LOPRESSOR) 50 MG tablet Take 50 mg by mouth 2 (two) times daily.     Marland Kitchen morphine (ROXANOL) 20 MG/ML concentrated solution Take 0.5 mLs (10 mg total) by mouth every 6 (six) hours as needed for severe pain. 30 mL 0  . omeprazole (PRILOSEC) 20 MG capsule Take 1 capsule (20 mg total) by mouth daily. (Patient taking differently: Take 20 mg by mouth 2 (two) times daily before a meal. ) 30 capsule  2  . ondansetron (ZOFRAN) 4 MG tablet Take 1 tablet (4 mg total) by mouth every 8 (eight) hours as needed for nausea or vomiting. 10 tablet 0  . predniSONE (DELTASONE) 5 MG tablet 5 tabs po day1; 4 tabs po day2; 3 tabs po day3; 2 tabs po day4; 1 tab po day5 15 tablet 0  . prochlorperazine (COMPAZINE) 10 MG tablet Take 1 tablet (10 mg total) by mouth every 6 (six) hours as needed for nausea or vomiting. 90 tablet 5  .  promethazine (PHENERGAN) 25 MG suppository Place 1 suppository (25 mg total) rectally every 6 (six) hours as needed for nausea or vomiting. 20 suppository 2  . senna (SENOKOT) 8.6 MG TABS tablet Take 1 tablet (8.6 mg total) by mouth 2 (two) times daily. 14 each 0  . tamsulosin (FLOMAX) 0.4 MG CAPS capsule Take 0.4 mg by mouth daily.  3  . warfarin (COUMADIN) 4 MG tablet Take 2 tablets by mouth with evening meal     No current facility-administered medications for this visit.    Facility-Administered Medications Ordered in Other Visits  Medication Dose Route Frequency Provider Last Rate Last Dose  . sodium chloride 0.9 % injection 10 mL  10 mL Intravenous PRN Lloyd Huger, MD   10 mL at 12/23/14 0945  . sodium chloride 0.9 % injection 10 mL  10 mL Intravenous PRN Lloyd Huger, MD   10 mL at 01/15/15 1512  . sodium chloride flush (NS) 0.9 % injection 10 mL  10 mL Intravenous PRN Lloyd Huger, MD   10 mL at 11/10/15 7858    OBJECTIVE: There were no vitals filed for this visit.   There is no height or weight on file to calculate BMI.    ECOG FS:2 - Symptomatic, <50% confined to bed  General: Well-developed, well-nourished, no acute distress. Eyes: anicteric sclera. HEENT: Oropharynx clear without exudate or erythema. No palpable lymphadenopathy. Lungs: Clear to auscultation bilaterally. Heart: Regular rate and rhythm. No rubs, murmurs, or gallops. Abdomen: Soft, nontender, nondistended. No organomegaly noted, normoactive bowel sounds. Musculoskeletal: No edema, cyanosis, or clubbing. Neuro: Alert, answering all questions appropriately. Cranial nerves grossly intact. Skin: No rashes or petechiae noted. Psych: Normal affect.   LAB RESULTS:  Lab Results  Component Value Date   NA 134 (L) 06/08/2016   K 3.3 (L) 06/08/2016   CL 104 06/08/2016   CO2 23 06/08/2016   GLUCOSE 93 06/08/2016   BUN 7 06/08/2016   CREATININE 0.71 06/08/2016   CALCIUM 7.8 (L) 06/08/2016    PROT 5.4 (L) 06/07/2016   ALBUMIN 2.7 (L) 06/07/2016   AST 20 06/07/2016   ALT 13 (L) 06/07/2016   ALKPHOS 72 06/07/2016   BILITOT 1.1 06/07/2016   GFRNONAA >60 06/08/2016   GFRAA >60 06/08/2016    Lab Results  Component Value Date   WBC 1.5 (L) 06/08/2016   NEUTROABS 1.6 05/31/2016   HGB 10.1 (L) 06/08/2016   HCT 28.5 (L) 06/08/2016   MCV 84.7 06/08/2016   PLT 85 (L) 06/08/2016     STUDIES: Dg Chest 2 View  Result Date: 06/07/2016 CLINICAL DATA:  76 year old male with shortness breath for 4 days. Symptoms started after radiation therapy treatment for esophageal cancer. Initial encounter. EXAM: CHEST  2 VIEW COMPARISON:  01/15/2016 01/26/2015 chest x-ray. FINDINGS: Mild progression of parenchymal changes left lung base. Question post therapy changes versus infiltrate. Central pulmonary vascular prominence stable. Right central line tip distal superior vena cava. No plain film  evidence of pulmonary metastatic disease. Cardiomegaly. Calcified aorta. Shoulder joint degenerative changes greater on the right. IMPRESSION: Mild progression of parenchymal changes left lung base. Question post therapy changes versus infiltrate. Electronically Signed   By: Genia Del M.D.   On: 06/07/2016 14:43   Dg Knee 1-2 Views Right  Result Date: 06/09/2016 CLINICAL DATA:  Pain following fall EXAM: RIGHT KNEE - 1-2 VIEW COMPARISON:  January 15, 2016 FINDINGS: Frontal and lateral views were obtained it. There is no fracture or dislocation. No appreciable joint effusion. There is moderately severe joint space narrowing medially with moderate narrowing laterally and in the patellofemoral joint region. There is spurring in all compartments. No erosive change. IMPRESSION: Osteoarthritic change, most marked medially. No acute fracture or joint effusion. Electronically Signed   By: Lowella Grip III M.D.   On: 06/09/2016 09:22   Dg Femur Min 2 Views Left  Result Date: 06/09/2016 CLINICAL DATA:  Pain  following fall EXAM: LEFT FEMUR 2 VIEWS COMPARISON:  None. FINDINGS: Frontal and lateral views were obtained. There is no acute fracture or dislocation. There is moderate osteoarthritic change in the left hip joint. There is no knee joint effusion. There is atherosclerotic calcification in the superficial femoral artery. IMPRESSION: Osteoarthritic change left hip joint. No acute fracture or dislocation. Superficial femoral artery atherosclerosis. Electronically Signed   By: Lowella Grip III M.D.   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  PLAN:    1. 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 Endoscopy on February 26, 2016 also revealed residual adenocarcinoma on biopsy. Patient completed 4 cycles of single agent Taxol on December 15, 2015. Patient is not a surgical candidate. Patient was referred to radiation oncology and although unconventional, plan to give him additional Maryruth Bun will conclude on June 13, 2016.  Because of this, Beryle Flock Has been discontinued. Proceed with cycle 3 of weekly concurrent carboplatinum and Taxol. Return to clinic in 1 week for consideration of cycle 4.  2. Hip fracture: Continued evaluation and treatment per orthopedics.  3. Anemia: Resolved. 4. Reflux: Continue Prilosec as prescribed. Continue with soft and liquid foods per PCP. Monitor.  5: Pain: Continue 21m/0.5ml Roxanol q 6 hours as needed. 6. Nutrition: Appreciate dietary input, but patient has declined feeding tube at this time.  7. Advanced directives: Patient was encouraged to fill out his advanced directives and discussed end-of-life issues with his family.   Patient expressed understanding and was in agreement with this plan. He also understands that He can call clinic at any time with any questions, concerns, or complaints.    TLloyd Huger MD 06/13/16 11:17 PM

## 2016-06-14 ENCOUNTER — Ambulatory Visit: Payer: Medicare Other

## 2016-06-14 ENCOUNTER — Inpatient Hospital Stay: Payer: Medicare Other

## 2016-06-14 ENCOUNTER — Inpatient Hospital Stay: Payer: Medicare Other | Admitting: Oncology

## 2016-06-15 ENCOUNTER — Ambulatory Visit: Payer: Medicare Other

## 2016-06-16 ENCOUNTER — Ambulatory Visit: Payer: Medicare Other

## 2016-06-17 ENCOUNTER — Ambulatory Visit: Payer: Medicare Other

## 2016-06-20 ENCOUNTER — Ambulatory Visit: Payer: Medicare Other

## 2016-06-20 ENCOUNTER — Ambulatory Visit
Admission: RE | Admit: 2016-06-20 | Discharge: 2016-06-20 | Disposition: A | Discharge status: Home / Self Care | Payer: Medicare Other | Source: Ambulatory Visit | Attending: Radiation Oncology | Admitting: Radiation Oncology

## 2016-06-20 ENCOUNTER — Other Ambulatory Visit: Payer: Self-pay | Admitting: Oncology

## 2016-06-20 DIAGNOSIS — C155 Malignant neoplasm of lower third of esophagus: Secondary | ICD-10-CM | POA: Diagnosis not present

## 2016-06-20 NOTE — Progress Notes (Signed)
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  INTERVAL HISTORY: Patient returns to clinic today for further evaluation and consideration of additional treatment. His performance status continues to decline. He also is having a more difficult time swallowing. He has increased nausea and vomiting. He continues to refuse PEG tube. He also continues to lose weight. He continues to have occasional falls. He has no neurologic complaints. He denies any shortness of breath or chest pain. He denies any constipation or diarrhea. He has no melena or hematochezia. He has no urinary complaints. Patient offers no further specific complaints today.  REVIEW OF SYSTEMS:   Review of Systems  Constitutional: Positive for malaise/fatigue and weight loss. Negative for fever.  Respiratory: Negative.  Negative for shortness of breath.   Cardiovascular: Negative for chest pain and leg swelling.  Gastrointestinal: Positive for heartburn, nausea and vomiting. Negative for abdominal pain, blood in stool and melena.  Genitourinary: Negative.   Musculoskeletal: Positive for joint pain.  Neurological: Positive for weakness.  Endo/Heme/Allergies: Does not bruise/bleed easily.  Psychiatric/Behavioral: Negative.  The patient is not nervous/anxious.    As per HPI. Otherwise, a complete review of systems is negative.   PAST MEDICAL HISTORY:  Hypertension, sleep apnea, atrial fibrillation.  PAST SURGICAL HISTORY: Negative.  FAMILY HISTORY: Reviewed and unchanged. No report of malignancy or chronic disease.     ADVANCED DIRECTIVES:    HEALTH MAINTENANCE: Social History    Substance Use Topics  . Smoking status: Current Some Day Smoker    Years: 62.00  . Smokeless tobacco: Current User    Types: Snuff  . Alcohol use No     No Known Allergies  Current Outpatient Prescriptions  Medication Sig Dispense Refill  . albuterol (PROVENTIL) (2.5 MG/3ML) 0.083% nebulizer solution Inhale 3 mLs into the lungs every 6 (six) hours. 75 mL 0  . digoxin (LANOXIN) 0.125 MG tablet Take 0.125 mg by mouth daily.     Marland Kitchen diltiazem (CARDIZEM CD) 180 MG 24 hr capsule Take 1 capsule (180 mg total) by mouth daily. 30 capsule 0  . doxycycline (VIBRA-TABS) 100 MG tablet Take 1 tablet (100 mg total) by mouth 2 (two) times daily. 14 tablet 0  . enalapril (VASOTEC) 20 MG tablet Take 20 mg by mouth daily.     . finasteride (PROSCAR) 5 MG tablet Take 5 mg by mouth daily.    . fluticasone (FLONASE) 50 MCG/ACT nasal spray ONE SPRAY IN EACH NOSTRIL AT NIGHT AS DIRECTED  11  . fluticasone (FLOVENT HFA) 220 MCG/ACT inhaler Inhale into the lungs.    . gabapentin (NEURONTIN) 300 MG capsule Take 300 mg by mouth 2 (two) times daily.     . metoprolol (LOPRESSOR) 50 MG tablet Take 50 mg by mouth 2 (two) times daily.     Marland Kitchen morphine (ROXANOL) 20 MG/ML concentrated solution Take 0.5 mLs (10 mg total) by mouth every 6 (six) hours as needed for severe pain. 30 mL 0  . omeprazole (PRILOSEC) 20 MG capsule Take 1 capsule (20 mg total) by mouth daily. (Patient taking differently: Take 20 mg by mouth 2 (two) times daily before a meal. ) 30 capsule 2  . ondansetron (ZOFRAN) 4 MG tablet  Take 1 tablet (4 mg total) by mouth every 8 (eight) hours as needed for nausea or vomiting. 10 tablet 0  . predniSONE (DELTASONE) 5 MG tablet 5 tabs po day1; 4 tabs po day2; 3 tabs po day3; 2 tabs po day4; 1 tab po day5 15 tablet 0  . prochlorperazine (COMPAZINE) 10 MG tablet Take 1 tablet (10 mg total) by mouth every 6 (six) hours as needed for nausea or vomiting. 90 tablet 5  . promethazine (PHENERGAN) 25 MG suppository Place 1  suppository (25 mg total) rectally every 6 (six) hours as needed for nausea or vomiting. 20 suppository 2  . senna (SENOKOT) 8.6 MG TABS tablet Take 1 tablet (8.6 mg total) by mouth 2 (two) times daily. 14 each 0  . tamsulosin (FLOMAX) 0.4 MG CAPS capsule Take 0.4 mg by mouth daily.  3  . warfarin (COUMADIN) 4 MG tablet Take 2 tablets by mouth with evening meal     No current facility-administered medications for this visit.    Facility-Administered Medications Ordered in Other Visits  Medication Dose Route Frequency Provider Last Rate Last Dose  . sodium chloride 0.9 % injection 10 mL  10 mL Intravenous PRN Lloyd Huger, MD   10 mL at 12/23/14 0945  . sodium chloride 0.9 % injection 10 mL  10 mL Intravenous PRN Lloyd Huger, MD   10 mL at 01/15/15 1512  . sodium chloride flush (NS) 0.9 % injection 10 mL  10 mL Intravenous PRN Lloyd Huger, MD   10 mL at 11/10/15 0939    OBJECTIVE: Vitals:   06/21/16 0859  BP: (!) 105/58  Pulse: 60  Resp: 18  Temp: (!) 95.9 F (35.5 C)     Body mass index is 28.98 kg/m.    ECOG FS:2 - Symptomatic, <50% confined to bed  General: Well-developed, well-nourished, no acute distress. Eyes: anicteric sclera. HEENT: Oropharynx clear without exudate or erythema. No palpable lymphadenopathy. Lungs: Clear to auscultation bilaterally. Heart: Regular rate and rhythm. No rubs, murmurs, or gallops. Abdomen: Soft, nontender, nondistended. No organomegaly noted, normoactive bowel sounds. Musculoskeletal: No edema, cyanosis, or clubbing. Neuro: Alert, answering all questions appropriately. Cranial nerves grossly intact. Skin: No rashes or petechiae noted. Psych: Normal affect.   LAB RESULTS:  Lab Results  Component Value Date   NA 135 06/21/2016   K 4.1 06/21/2016   CL 102 06/21/2016   CO2 25 06/21/2016   GLUCOSE 115 (H) 06/21/2016   BUN 16 06/21/2016   CREATININE 0.76 06/21/2016   CALCIUM 8.4 (L) 06/21/2016   PROT 5.7 (L) 06/21/2016     ALBUMIN 2.7 (L) 06/21/2016   AST 27 06/21/2016   ALT 14 (L) 06/21/2016   ALKPHOS 91 06/21/2016   BILITOT 0.6 06/21/2016   GFRNONAA >60 06/21/2016   GFRAA >60 06/21/2016    Lab Results  Component Value Date   WBC 5.8 06/21/2016   NEUTROABS 4.4 06/21/2016   HGB 12.2 (L) 06/21/2016   HCT 35.8 (L) 06/21/2016   MCV 87.0 06/21/2016   PLT 169 06/21/2016     STUDIES: Dg Chest 2 View  Result Date: 06/07/2016 CLINICAL DATA:  76 year old male with shortness breath for 4 days. Symptoms started after radiation therapy treatment for esophageal cancer. Initial encounter. EXAM: CHEST  2 VIEW COMPARISON:  01/15/2016 01/26/2015 chest x-ray. FINDINGS: Mild progression of parenchymal changes left lung base. Question post therapy changes versus infiltrate. Central pulmonary vascular prominence stable. Right central line tip distal superior vena cava. No plain  film evidence of pulmonary metastatic disease. Cardiomegaly. Calcified aorta. Shoulder joint degenerative changes greater on the right. IMPRESSION: Mild progression of parenchymal changes left lung base. Question post therapy changes versus infiltrate. Electronically Signed   By: Genia Del M.D.   On: 06/07/2016 14:43   Dg Knee 1-2 Views Right  Result Date: 06/09/2016 CLINICAL DATA:  Pain following fall EXAM: RIGHT KNEE - 1-2 VIEW COMPARISON:  January 15, 2016 FINDINGS: Frontal and lateral views were obtained it. There is no fracture or dislocation. No appreciable joint effusion. There is moderately severe joint space narrowing medially with moderate narrowing laterally and in the patellofemoral joint region. There is spurring in all compartments. No erosive change. IMPRESSION: Osteoarthritic change, most marked medially. No acute fracture or joint effusion. Electronically Signed   By: Lowella Grip III M.D.   On: 06/09/2016 09:22   Dg Femur Min 2 Views Left  Result Date: 06/09/2016 CLINICAL DATA:  Pain following fall EXAM: LEFT FEMUR 2  VIEWS COMPARISON:  None. FINDINGS: Frontal and lateral views were obtained. There is no acute fracture or dislocation. There is moderate osteoarthritic change in the left hip joint. There is no knee joint effusion. There is atherosclerotic calcification in the superficial femoral artery. IMPRESSION: Osteoarthritic change left hip joint. No acute fracture or dislocation. Superficial femoral artery atherosclerosis. Electronically Signed   By: Lowella Grip III M.D.   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  PLAN:    1. 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 Endoscopy on February 26, 2016 also revealed residual adenocarcinoma on biopsy. Patient completed 4 cycles of single agent Taxol on December 15, 2015. Patient is not a surgical candidate. Given patient's declining performance status, it was agreed upon that no further treatments can be tolerated. Have recommended hospice. Patient wishes to follow-up in 1 month for further evaluation, but this appointment likely will be canceled. No further intervention is needed at this time.   2. Hip fracture: Continued evaluation and treatment per orthopedics.  3. Anemia: Resolved. 4. Reflux: Continue Prilosec as prescribed. Continue with soft and liquid foods per PCP. Monitor.  5: Pain: Continue 29m/0.5ml Roxanol q 6 hours as needed. 6. Nutrition: Appreciate dietary input, but patient has declined feeding tube at this time.  7. Advanced directives: Patient was encouraged to fill out his advanced directives and discussed end-of-life issues with his family.  Approximately 30 minutes was spent in discussion of which greater than 50% was consultation.  Patient expressed understanding and was in agreement with this plan. He also understands that He can call clinic at any time with any questions, concerns,  or complaints.    TLloyd Huger MD 06/24/16 4:36 PM

## 2016-06-21 ENCOUNTER — Ambulatory Visit: Payer: Medicare Other

## 2016-06-21 ENCOUNTER — Inpatient Hospital Stay: Payer: No Typology Code available for payment source

## 2016-06-21 ENCOUNTER — Telehealth: Payer: Self-pay | Admitting: *Deleted

## 2016-06-21 ENCOUNTER — Inpatient Hospital Stay: Payer: No Typology Code available for payment source | Attending: Oncology | Admitting: Oncology

## 2016-06-21 VITALS — BP 105/58 | HR 60 | Temp 95.9°F | Resp 18 | Wt 185.0 lb

## 2016-06-21 DIAGNOSIS — F17210 Nicotine dependence, cigarettes, uncomplicated: Secondary | ICD-10-CM | POA: Diagnosis not present

## 2016-06-21 DIAGNOSIS — C801 Malignant (primary) neoplasm, unspecified: Secondary | ICD-10-CM

## 2016-06-21 DIAGNOSIS — R12 Heartburn: Secondary | ICD-10-CM | POA: Diagnosis not present

## 2016-06-21 DIAGNOSIS — Z9181 History of falling: Secondary | ICD-10-CM | POA: Insufficient documentation

## 2016-06-21 DIAGNOSIS — Z79899 Other long term (current) drug therapy: Secondary | ICD-10-CM

## 2016-06-21 DIAGNOSIS — Z8781 Personal history of (healed) traumatic fracture: Secondary | ICD-10-CM | POA: Diagnosis not present

## 2016-06-21 DIAGNOSIS — M2550 Pain in unspecified joint: Secondary | ICD-10-CM | POA: Diagnosis not present

## 2016-06-21 DIAGNOSIS — I10 Essential (primary) hypertension: Secondary | ICD-10-CM | POA: Insufficient documentation

## 2016-06-21 DIAGNOSIS — R1310 Dysphagia, unspecified: Secondary | ICD-10-CM | POA: Insufficient documentation

## 2016-06-21 DIAGNOSIS — R112 Nausea with vomiting, unspecified: Secondary | ICD-10-CM | POA: Diagnosis not present

## 2016-06-21 DIAGNOSIS — K219 Gastro-esophageal reflux disease without esophagitis: Secondary | ICD-10-CM | POA: Insufficient documentation

## 2016-06-21 DIAGNOSIS — R634 Abnormal weight loss: Secondary | ICD-10-CM | POA: Diagnosis not present

## 2016-06-21 DIAGNOSIS — G4730 Sleep apnea, unspecified: Secondary | ICD-10-CM | POA: Diagnosis not present

## 2016-06-21 DIAGNOSIS — I4891 Unspecified atrial fibrillation: Secondary | ICD-10-CM | POA: Diagnosis not present

## 2016-06-21 DIAGNOSIS — I700 Atherosclerosis of aorta: Secondary | ICD-10-CM | POA: Insufficient documentation

## 2016-06-21 DIAGNOSIS — C155 Malignant neoplasm of lower third of esophagus: Secondary | ICD-10-CM | POA: Diagnosis not present

## 2016-06-21 DIAGNOSIS — I517 Cardiomegaly: Secondary | ICD-10-CM | POA: Diagnosis not present

## 2016-06-21 LAB — COMPREHENSIVE METABOLIC PANEL
ALBUMIN: 2.7 g/dL — AB (ref 3.5–5.0)
ALK PHOS: 91 U/L (ref 38–126)
ALT: 14 U/L — ABNORMAL LOW (ref 17–63)
ANION GAP: 8 (ref 5–15)
AST: 27 U/L (ref 15–41)
BILIRUBIN TOTAL: 0.6 mg/dL (ref 0.3–1.2)
BUN: 16 mg/dL (ref 6–20)
CALCIUM: 8.4 mg/dL — AB (ref 8.9–10.3)
CO2: 25 mmol/L (ref 22–32)
Chloride: 102 mmol/L (ref 101–111)
Creatinine, Ser: 0.76 mg/dL (ref 0.61–1.24)
GFR calc non Af Amer: >60 mL/min (ref >60)
Glucose, Bld: 115 mg/dL — ABNORMAL HIGH (ref 65–99)
POTASSIUM: 4.1 mmol/L (ref 3.5–5.1)
SODIUM: 135 mmol/L (ref 135–145)
TOTAL PROTEIN: 5.7 g/dL — AB (ref 6.5–8.1)

## 2016-06-21 LAB — CBC WITH DIFFERENTIAL/PLATELET
BASOS PCT: 1 %
Basophils Absolute: 0.1 10*3/uL (ref 0–0.1)
EOS ABS: 0.1 10*3/uL (ref 0–0.7)
Eosinophils Relative: 1 %
HEMATOCRIT: 35.8 % — AB (ref 40.0–52.0)
HEMOGLOBIN: 12.2 g/dL — AB (ref 13.0–18.0)
LYMPHS ABS: 0.7 10*3/uL — AB (ref 1.0–3.6)
Lymphocytes Relative: 12 %
MCH: 29.7 pg (ref 26.0–34.0)
MCHC: 34.2 g/dL (ref 32.0–36.0)
MCV: 87 fL (ref 80.0–100.0)
MONO ABS: 0.6 10*3/uL (ref 0.2–1.0)
MONOS PCT: 11 %
NEUTROS PCT: 75 %
Neutro Abs: 4.4 10*3/uL (ref 1.4–6.5)
Platelets: 169 10*3/uL (ref 150–440)
RBC: 4.11 MIL/uL — ABNORMAL LOW (ref 4.40–5.90)
RDW: 19.2 % — AB (ref 11.5–14.5)
WBC: 5.8 10*3/uL (ref 3.8–10.6)

## 2016-06-21 MED ORDER — HEPARIN SOD (PORK) LOCK FLUSH 100 UNIT/ML IV SOLN
500.0000 [IU] | Freq: Once | INTRAVENOUS | Status: AC
  Administered 2016-06-21: 500 [IU] via INTRAVENOUS

## 2016-06-21 MED ORDER — SODIUM CHLORIDE 0.9% FLUSH
10.0000 mL | INTRAVENOUS | Status: DC | PRN
  Administered 2016-06-21: 10 mL via INTRAVENOUS
  Filled 2016-06-21: qty 10

## 2016-06-21 MED ORDER — HEPARIN SOD (PORK) LOCK FLUSH 100 UNIT/ML IV SOLN
INTRAVENOUS | Status: AC
  Filled 2016-06-21: qty 5

## 2016-06-21 NOTE — Telephone Encounter (Signed)
Patients sister called to ask about patient's treatment today. She would like someone to call her .

## 2016-06-21 NOTE — Progress Notes (Signed)
Patient states he does not feel good today.  Patient is in wheelchair, lethargic.

## 2016-06-22 ENCOUNTER — Ambulatory Visit: Payer: Medicare Other

## 2016-06-23 ENCOUNTER — Ambulatory Visit: Payer: Medicare Other

## 2016-06-24 ENCOUNTER — Ambulatory Visit: Payer: Medicare Other

## 2016-06-27 ENCOUNTER — Ambulatory Visit: Payer: Medicare Other

## 2016-07-01 ENCOUNTER — Encounter: Payer: Self-pay | Admitting: Emergency Medicine

## 2016-07-01 ENCOUNTER — Emergency Department: Payer: Medicare Other

## 2016-07-01 ENCOUNTER — Observation Stay
Admission: EM | Admit: 2016-07-01 | Discharge: 2016-07-05 | Disposition: A | Discharge status: Home / Self Care | Payer: Medicare Other | Attending: Internal Medicine | Admitting: Internal Medicine

## 2016-07-01 DIAGNOSIS — Z7951 Long term (current) use of inhaled steroids: Secondary | ICD-10-CM | POA: Insufficient documentation

## 2016-07-01 DIAGNOSIS — E860 Dehydration: Secondary | ICD-10-CM | POA: Insufficient documentation

## 2016-07-01 DIAGNOSIS — K219 Gastro-esophageal reflux disease without esophagitis: Secondary | ICD-10-CM | POA: Diagnosis not present

## 2016-07-01 DIAGNOSIS — Z7901 Long term (current) use of anticoagulants: Secondary | ICD-10-CM | POA: Diagnosis not present

## 2016-07-01 DIAGNOSIS — R1013 Epigastric pain: Secondary | ICD-10-CM

## 2016-07-01 DIAGNOSIS — I5022 Chronic systolic (congestive) heart failure: Secondary | ICD-10-CM | POA: Diagnosis not present

## 2016-07-01 DIAGNOSIS — Z8501 Personal history of malignant neoplasm of esophagus: Secondary | ICD-10-CM | POA: Diagnosis not present

## 2016-07-01 DIAGNOSIS — E871 Hypo-osmolality and hyponatremia: Secondary | ICD-10-CM | POA: Diagnosis not present

## 2016-07-01 DIAGNOSIS — C155 Malignant neoplasm of lower third of esophagus: Secondary | ICD-10-CM | POA: Diagnosis not present

## 2016-07-01 DIAGNOSIS — Z79899 Other long term (current) drug therapy: Secondary | ICD-10-CM | POA: Diagnosis not present

## 2016-07-01 DIAGNOSIS — R627 Adult failure to thrive: Secondary | ICD-10-CM | POA: Diagnosis not present

## 2016-07-01 DIAGNOSIS — R1310 Dysphagia, unspecified: Secondary | ICD-10-CM | POA: Diagnosis not present

## 2016-07-01 DIAGNOSIS — R0602 Shortness of breath: Secondary | ICD-10-CM

## 2016-07-01 DIAGNOSIS — D649 Anemia, unspecified: Secondary | ICD-10-CM

## 2016-07-01 DIAGNOSIS — I482 Chronic atrial fibrillation: Secondary | ICD-10-CM | POA: Insufficient documentation

## 2016-07-01 DIAGNOSIS — I959 Hypotension, unspecified: Secondary | ICD-10-CM | POA: Diagnosis not present

## 2016-07-01 DIAGNOSIS — M1990 Unspecified osteoarthritis, unspecified site: Secondary | ICD-10-CM | POA: Insufficient documentation

## 2016-07-01 DIAGNOSIS — J690 Pneumonitis due to inhalation of food and vomit: Principal | ICD-10-CM | POA: Insufficient documentation

## 2016-07-01 DIAGNOSIS — Z9221 Personal history of antineoplastic chemotherapy: Secondary | ICD-10-CM | POA: Insufficient documentation

## 2016-07-01 DIAGNOSIS — C159 Malignant neoplasm of esophagus, unspecified: Secondary | ICD-10-CM

## 2016-07-01 DIAGNOSIS — J181 Lobar pneumonia, unspecified organism: Secondary | ICD-10-CM

## 2016-07-01 DIAGNOSIS — G4730 Sleep apnea, unspecified: Secondary | ICD-10-CM | POA: Insufficient documentation

## 2016-07-01 DIAGNOSIS — J449 Chronic obstructive pulmonary disease, unspecified: Secondary | ICD-10-CM | POA: Diagnosis not present

## 2016-07-01 DIAGNOSIS — I110 Hypertensive heart disease with heart failure: Secondary | ICD-10-CM | POA: Insufficient documentation

## 2016-07-01 DIAGNOSIS — I481 Persistent atrial fibrillation: Secondary | ICD-10-CM | POA: Insufficient documentation

## 2016-07-01 DIAGNOSIS — D61818 Other pancytopenia: Secondary | ICD-10-CM | POA: Diagnosis not present

## 2016-07-01 DIAGNOSIS — I426 Alcoholic cardiomyopathy: Secondary | ICD-10-CM | POA: Insufficient documentation

## 2016-07-01 DIAGNOSIS — I248 Other forms of acute ischemic heart disease: Secondary | ICD-10-CM | POA: Insufficient documentation

## 2016-07-01 DIAGNOSIS — E872 Acidosis, unspecified: Secondary | ICD-10-CM

## 2016-07-01 DIAGNOSIS — M19011 Primary osteoarthritis, right shoulder: Secondary | ICD-10-CM | POA: Insufficient documentation

## 2016-07-01 DIAGNOSIS — F17210 Nicotine dependence, cigarettes, uncomplicated: Secondary | ICD-10-CM | POA: Insufficient documentation

## 2016-07-01 DIAGNOSIS — R778 Other specified abnormalities of plasma proteins: Secondary | ICD-10-CM

## 2016-07-01 DIAGNOSIS — J189 Pneumonia, unspecified organism: Secondary | ICD-10-CM

## 2016-07-01 DIAGNOSIS — Z923 Personal history of irradiation: Secondary | ICD-10-CM | POA: Insufficient documentation

## 2016-07-01 DIAGNOSIS — R531 Weakness: Secondary | ICD-10-CM

## 2016-07-01 DIAGNOSIS — N400 Enlarged prostate without lower urinary tract symptoms: Secondary | ICD-10-CM | POA: Insufficient documentation

## 2016-07-01 DIAGNOSIS — R7989 Other specified abnormal findings of blood chemistry: Secondary | ICD-10-CM

## 2016-07-01 HISTORY — DX: Malignant neoplasm of esophagus, unspecified: C15.9

## 2016-07-01 LAB — URINALYSIS, COMPLETE (UACMP) WITH MICROSCOPIC
Bacteria, UA: NONE SEEN
Bilirubin Urine: NEGATIVE
Glucose, UA: NEGATIVE mg/dL
Hgb urine dipstick: NEGATIVE
KETONES UR: 5 mg/dL — AB
Leukocytes, UA: NEGATIVE
Nitrite: NEGATIVE
PH: 6 (ref 5.0–8.0)
Protein, ur: NEGATIVE mg/dL
SPECIFIC GRAVITY, URINE: 1.033 — AB (ref 1.005–1.030)

## 2016-07-01 LAB — CBC
HCT: 40.7 % (ref 40.0–52.0)
Hemoglobin: 13.9 g/dL (ref 13.0–18.0)
MCH: 30.1 pg (ref 26.0–34.0)
MCHC: 34 g/dL (ref 32.0–36.0)
MCV: 88.4 fL (ref 80.0–100.0)
PLATELETS: 158 10*3/uL (ref 150–440)
RBC: 4.61 MIL/uL (ref 4.40–5.90)
RDW: 19.7 % — AB (ref 11.5–14.5)
WBC: 6.8 10*3/uL (ref 3.8–10.6)

## 2016-07-01 LAB — MRSA PCR SCREENING: MRSA BY PCR: NEGATIVE

## 2016-07-01 LAB — LACTIC ACID, PLASMA
LACTIC ACID, VENOUS: 2.7 mmol/L — AB (ref 0.5–1.9)
LACTIC ACID, VENOUS: 1.9 mmol/L (ref 0.5–1.9)
LACTIC ACID, VENOUS: 1.6 mmol/L (ref 0.5–1.9)

## 2016-07-01 LAB — TROPONIN I
TROPONIN I: 0.04 ng/mL — AB (ref <0.03)
Troponin I: 0.05 ng/mL (ref <0.03)

## 2016-07-01 LAB — BASIC METABOLIC PANEL
ANION GAP: 8 (ref 5–15)
BUN: 18 mg/dL (ref 6–20)
CALCIUM: 8.9 mg/dL (ref 8.9–10.3)
CO2: 27 mmol/L (ref 22–32)
CREATININE: 1.1 mg/dL (ref 0.61–1.24)
Chloride: 99 mmol/L — ABNORMAL LOW (ref 101–111)
GFR calc Af Amer: >60 mL/min (ref >60)
GLUCOSE: 121 mg/dL — AB (ref 65–99)
Potassium: 4.7 mmol/L (ref 3.5–5.1)
Sodium: 134 mmol/L — ABNORMAL LOW (ref 135–145)

## 2016-07-01 LAB — PROTIME-INR
INR: 3.12
PROTHROMBIN TIME: 32.8 s — AB (ref 11.4–15.2)

## 2016-07-01 MED ORDER — SENNA 8.6 MG PO TABS
1.0000 | ORAL_TABLET | Freq: Two times a day (BID) | ORAL | Status: DC
  Administered 2016-07-01 – 2016-07-04 (×6): 8.6 mg via ORAL
  Filled 2016-07-01 (×7): qty 1

## 2016-07-01 MED ORDER — MORPHINE SULFATE (CONCENTRATE) 10 MG/0.5ML PO SOLN: 10.0000 mg | Freq: Four times a day (QID) | ORAL | Status: DC | PRN

## 2016-07-01 MED ORDER — WARFARIN SODIUM 2.5 MG PO TABS
5.0000 mg | ORAL_TABLET | Freq: Once | ORAL | Status: AC
  Administered 2016-07-01: 5 mg via ORAL
  Filled 2016-07-01: qty 1
  Filled 2016-07-01: qty 2
  Filled 2016-07-01: qty 1

## 2016-07-01 MED ORDER — ONDANSETRON HCL 4 MG/2ML IJ SOLN
4.0000 mg | Freq: Four times a day (QID) | INTRAMUSCULAR | Status: DC | PRN
  Administered 2016-07-02 – 2016-07-04 (×2): 4 mg via INTRAVENOUS
  Filled 2016-07-01 (×2): qty 2

## 2016-07-01 MED ORDER — SODIUM CHLORIDE 0.9 % IV SOLN
Freq: Once | INTRAVENOUS | Status: AC
  Administered 2016-07-01: 19:00:00 via INTRAVENOUS

## 2016-07-01 MED ORDER — OXYCODONE-ACETAMINOPHEN 5-325 MG PO TABS
1.0000 | ORAL_TABLET | Freq: Once | ORAL | Status: AC
  Administered 2016-07-01: 1 via ORAL
  Filled 2016-07-01: qty 1

## 2016-07-01 MED ORDER — GABAPENTIN 300 MG PO CAPS
300.0000 mg | ORAL_CAPSULE | Freq: Two times a day (BID) | ORAL | Status: DC
  Administered 2016-07-01 – 2016-07-05 (×7): 300 mg via ORAL
  Filled 2016-07-01 (×7): qty 1

## 2016-07-01 MED ORDER — ACETAMINOPHEN 650 MG RE SUPP: 650.0000 mg | Freq: Four times a day (QID) | RECTAL | Status: DC | PRN

## 2016-07-01 MED ORDER — WARFARIN - PHARMACIST DOSING INPATIENT
Freq: Every day | Status: DC
  Administered 2016-07-02 – 2016-07-03 (×2)
  Filled 2016-07-01 (×5): qty 1

## 2016-07-01 MED ORDER — SODIUM CHLORIDE 0.9 % IV BOLUS (SEPSIS)
1000.0000 mL | Freq: Once | INTRAVENOUS | Status: AC
  Administered 2016-07-01: 1000 mL via INTRAVENOUS

## 2016-07-01 MED ORDER — VANCOMYCIN HCL IN DEXTROSE 1-5 GM/200ML-% IV SOLN
1000.0000 mg | Freq: Once | INTRAVENOUS | Status: AC
  Administered 2016-07-01: 1000 mg via INTRAVENOUS
  Filled 2016-07-01: qty 200

## 2016-07-01 MED ORDER — PANTOPRAZOLE SODIUM 40 MG PO TBEC
40.0000 mg | DELAYED_RELEASE_TABLET | Freq: Every day | ORAL | Status: DC
  Administered 2016-07-01 – 2016-07-05 (×4): 40 mg via ORAL
  Filled 2016-07-01 (×4): qty 1

## 2016-07-01 MED ORDER — FINASTERIDE 5 MG PO TABS
5.0000 mg | ORAL_TABLET | Freq: Every day | ORAL | Status: DC
Start: 2016-07-01 — End: 2016-07-05
  Administered 2016-07-01 – 2016-07-05 (×4): 5 mg via ORAL
  Filled 2016-07-01 (×4): qty 1

## 2016-07-01 MED ORDER — SODIUM CHLORIDE 0.9% FLUSH
3.0000 mL | Freq: Two times a day (BID) | INTRAVENOUS | Status: DC
  Administered 2016-07-03 – 2016-07-05 (×4): 3 mL via INTRAVENOUS

## 2016-07-01 MED ORDER — TAMSULOSIN HCL 0.4 MG PO CAPS
0.4000 mg | ORAL_CAPSULE | Freq: Every day | ORAL | Status: DC
  Administered 2016-07-01 – 2016-07-05 (×4): 0.4 mg via ORAL
  Filled 2016-07-01 (×4): qty 1

## 2016-07-01 MED ORDER — ONDANSETRON HCL 4 MG PO TABS: 4.0000 mg | ORAL_TABLET | Freq: Four times a day (QID) | ORAL | Status: DC | PRN

## 2016-07-01 MED ORDER — PIPERACILLIN-TAZOBACTAM 3.375 G IVPB
3.3750 g | Freq: Once | INTRAVENOUS | Status: AC
  Administered 2016-07-01: 3.375 g via INTRAVENOUS
  Filled 2016-07-01: qty 50

## 2016-07-01 MED ORDER — WARFARIN SODIUM 4 MG PO TABS: 4.0000 mg | ORAL_TABLET | Freq: Once | ORAL | Status: DC

## 2016-07-01 MED ORDER — PIPERACILLIN-TAZOBACTAM 3.375 G IVPB
3.3750 g | Freq: Three times a day (TID) | INTRAVENOUS | Status: DC
  Administered 2016-07-02 – 2016-07-05 (×10): 3.375 g via INTRAVENOUS
  Filled 2016-07-01 (×10): qty 50

## 2016-07-01 MED ORDER — ACETAMINOPHEN 325 MG PO TABS: 650.0000 mg | ORAL_TABLET | Freq: Four times a day (QID) | ORAL | Status: DC | PRN

## 2016-07-01 MED ORDER — VANCOMYCIN HCL 10 G IV SOLR
1250.0000 mg | INTRAVENOUS | Status: DC
  Administered 2016-07-02: 1250 mg via INTRAVENOUS
  Filled 2016-07-01 (×2): qty 1250

## 2016-07-01 MED ORDER — HYDROCODONE-ACETAMINOPHEN 10-325 MG PO TABS: 1.0000 | ORAL_TABLET | Freq: Three times a day (TID) | ORAL | Status: DC | PRN

## 2016-07-01 MED ORDER — SODIUM CHLORIDE 0.9 % IV SOLN
INTRAVENOUS | Status: DC
  Administered 2016-07-01 – 2016-07-02 (×2): via INTRAVENOUS

## 2016-07-01 MED ORDER — ALBUTEROL SULFATE (2.5 MG/3ML) 0.083% IN NEBU
3.0000 mL | INHALATION_SOLUTION | Freq: Four times a day (QID) | RESPIRATORY_TRACT | Status: DC
  Administered 2016-07-02 (×4): 3 mL via RESPIRATORY_TRACT
  Filled 2016-07-01 (×4): qty 3

## 2016-07-01 MED ORDER — BUDESONIDE 0.25 MG/2ML IN SUSP
0.2500 mg | Freq: Two times a day (BID) | RESPIRATORY_TRACT | Status: DC
  Administered 2016-07-02 – 2016-07-04 (×6): 0.25 mg via RESPIRATORY_TRACT
  Filled 2016-07-01 (×7): qty 2

## 2016-07-01 MED ORDER — DIGOXIN 125 MCG PO TABS
0.1250 mg | ORAL_TABLET | Freq: Every day | ORAL | Status: DC
  Administered 2016-07-01 – 2016-07-03 (×3): 0.125 mg via ORAL
  Filled 2016-07-01 (×3): qty 1

## 2016-07-01 MED ORDER — IOPAMIDOL (ISOVUE-370) INJECTION 76%
75.0000 mL | Freq: Once | INTRAVENOUS | Status: AC | PRN
  Administered 2016-07-01: 75 mL via INTRAVENOUS

## 2016-07-01 MED ORDER — FLUTICASONE PROPIONATE HFA 220 MCG/ACT IN AERO: 2.0000 | INHALATION_SPRAY | Freq: Two times a day (BID) | RESPIRATORY_TRACT | Status: DC

## 2016-07-01 NOTE — ED Triage Notes (Signed)
Pt states recently released from hospital with pneumonia

## 2016-07-01 NOTE — Progress Notes (Signed)
Pharmacy Antibiotic Note  Jeremy Gordon is a 76 y.o. male admitted on 07/01/2016 with  pneumonia.  Pharmacy has been consulted for Zosyn and Vancomycin  dosing.  Plan: Ke: 0.053   t1/2: 13  VD: 59  Will start the patient on vancomycin 1250mg  IV every 18 hours with 6 hour stack dosing. Calculated trough at Css is 16. Trough ordered prior to 4th dose. Will monitor renal function daily and adjust dose as needed. Recommend MRSA PCR- if negative, recommend D/C of vancomycin   Will start patient on Zosyn 3.375 IV EI every 8 hours.   Height: 5\' 8"  (172.7 cm) Weight: 185 lb (83.9 kg) IBW/kg (Calculated) : 68.4  Temp (24hrs), Avg:97.5 F (36.4 C), Min:97.1 F (36.2 C), Max:97.9 F (36.6 C)   Recent Labs Lab 07/01/16 1549 07/01/16 1601 07/01/16 1753  WBC 6.8  --   --   CREATININE 1.10  --   --   LATICACIDVEN  --  2.7* 1.9    Estimated Creatinine Clearance: 58.4 mL/min (by C-G formula based on SCr of 1.1 mg/dL).    No Known Allergies  Antimicrobials this admission: 3/16 zosyn  >>  3/16 vancomycin  >>  Dose adjustments this admission:  Microbiology results: 3/16 BCx: sent  3/16 MRSA PCR:   Thank you for allowing pharmacy to be a part of this patient's care.  Pernell Dupre, PharmD, BCPS Clinical Pharmacist 07/01/2016 8:13 PM

## 2016-07-01 NOTE — H&P (Signed)
[facility] [place] NAME: Jeremy Gordon    MR#:  [id number]  DATE OF BIRTH:  Oct 05, 1940  DATE OF ADMISSION:  07/01/2016  PRIMARY CARE PHYSICIAN: [name], MD   REQUESTING/REFERRING PHYSICIAN: Dr. Lenise Arena.   CHIEF COMPLAINT:   Chief Complaint  Patient presents with  . Shortness of Breath    HISTORY OF PRESENT ILLNESS:  Jeremy Gordon  is a 76 y.o. male with a known history of Esophageal cancer, COPD, atrial fibrillation, GERD, hypertension who presents to the hospital due to shortness of breath, rigors and chills. Patient says he recently was discharged from a skilled nursing facility about a day or 2 ago and got home and has been having some chills and Rigors and therefore came to the ER for further evaluation. In the emergency room patient was also noted to be hypotensive and underwent a CT scan of his chest which showed pneumonia. Hospitalist services were contacted further treatment and evaluation. Patient admits to a cough which is nonproductive, he admits to shortness of breath on exertion, no nausea, vomiting, abdominal pain, chest pain or any other associated symptoms presently.  PAST MEDICAL HISTORY:   Past Medical History:  Diagnosis Date  . Arthritis   . Atrial fibrillation ([place])   . COPD (chronic obstructive pulmonary disease) ([place])   . Dysrhythmia   . Esophageal cancer ([place])   . Esophageal carcinoma ([place])    a. Chemo: FOLFIRI  . Essential hypertension   . GERD (gastroesophageal reflux disease)   . History of stress test    a. 06/2003 MV: EF 56%, no ischemia/infarct.  . Persistent atrial fibrillation ([place])    a. 07/2013 Echo: EF 60-65%, mod dil LA, mild TR;  CHA2DS2VASc = 3-->chronic coumadin.  . Sleep apnea     PAST SURGICAL HISTORY:   Past Surgical History:  Procedure Laterality Date  . ESOPHAGOGASTRODUODENOSCOPY (EGD) WITH PROPOFOL N/A 08/31/2015   Procedure: ESOPHAGOGASTRODUODENOSCOPY (EGD) WITH  PROPOFOL;  Surgeon: Manya Silvas, MD;  Location: [facility] ENDOSCOPY;  Service: Endoscopy;  Laterality: N/A;  . ESOPHAGOGASTRODUODENOSCOPY (EGD) WITH PROPOFOL N/A 02/26/2016   Procedure: ESOPHAGOGASTRODUODENOSCOPY (EGD) WITH PROPOFOL;  Surgeon: Manya Silvas, MD;  Location: [facility] ENDOSCOPY;  Service: Endoscopy;  Laterality: N/A;  . FEMUR IM NAIL Right 01/17/2016   Procedure: INTRAMEDULLARY (IM) NAIL FEMORAL;  Surgeon: Earnestine Leys, MD;  Location: [facility] ORS;  Service: Orthopedics;  Laterality: Right;  . HERNIA REPAIR    . PERIPHERAL VASCULAR CATHETERIZATION N/A 11/20/2014   Procedure: Glori Luis Cath Insertion;  Surgeon: Algernon Huxley, MD;  Location: [place] CV LAB;  Service: Cardiovascular;  Laterality: N/A;    SOCIAL HISTORY:   Social History  Substance Use Topics  . Smoking status: Current Some Day Smoker    Years: 62.00  . Smokeless tobacco: Current User    Types: Snuff  . Alcohol use No    FAMILY HISTORY:   Family History  Problem Relation Age of Onset  . Cancer Mother   . Stroke Father   . Hypertension Sister   . Hypertension Brother     DRUG ALLERGIES:  No Known Allergies  REVIEW OF SYSTEMS:   Review of Systems  Constitutional: Negative for fever and weight loss.  HENT: Negative for congestion, nosebleeds and tinnitus.   Eyes: Negative for blurred vision, double vision and redness.  Respiratory: Positive for cough and shortness of breath. Negative for hemoptysis.   Cardiovascular: Negative for chest pain, orthopnea, leg swelling and PND.  Gastrointestinal: Negative for abdominal pain, diarrhea, melena, nausea and vomiting.  Genitourinary: Negative for dysuria, hematuria and urgency.  Musculoskeletal: Negative for falls and joint pain.  Neurological: Positive for weakness (generalized). Negative for dizziness, tingling, sensory change, focal weakness, seizures and headaches.  Endo/Heme/Allergies: Negative for polydipsia. Does not bruise/bleed easily.   Psychiatric/Behavioral: Negative for depression and memory loss. The patient is not nervous/anxious.     MEDICATIONS AT HOME:   Prior to Admission medications   Medication Sig Start Date End Date Taking? Authorizing Provider  albuterol (PROVENTIL) (2.5 MG/3ML) 0.083% nebulizer solution Inhale 3 mLs into the lungs every 6 (six) hours. 06/09/16  Yes Loletha Grayer, MD  diltiazem (CARDIZEM CD) 180 MG 24 hr capsule Take 1 capsule (180 mg total) by mouth daily. Patient taking differently: Take 120 mg by mouth daily.  06/09/16  Yes Richard Leslye Peer, MD  enalapril (VASOTEC) 20 MG tablet Take 20 mg by mouth daily.    Yes Historical Provider, MD  HYDROcodone-acetaminophen (NORCO) 10-325 MG tablet Take 1 tablet by mouth every 8 (eight) hours as needed.   Yes Historical Provider, MD  ondansetron (ZOFRAN) 4 MG tablet Take 1 tablet (4 mg total) by mouth every 8 (eight) hours as needed for nausea or vomiting. 05/06/16  Yes Lisa Roca, MD  prochlorperazine (COMPAZINE) 10 MG tablet Take 1 tablet (10 mg total) by mouth every 6 (six) hours as needed for nausea or vomiting. 11/13/15  Yes Lloyd Huger, MD  promethazine (PHENERGAN) 25 MG suppository Place 1 suppository (25 mg total) rectally every 6 (six) hours as needed for nausea or vomiting. 05/23/16  Yes Lloyd Huger, MD  senna (SENOKOT) 8.6 MG TABS tablet Take 1 tablet (8.6 mg total) by mouth 2 (two) times daily. 01/19/16  Yes Demetrios Loll, MD  warfarin (COUMADIN) 4 MG tablet Take 2 tablets by mouth with evening meal 07/27/15  Yes Historical Provider, MD  digoxin (LANOXIN) 0.125 MG tablet Take 0.125 mg by mouth daily.  02/02/15   Historical Provider, MD  doxycycline (VIBRA-TABS) 100 MG tablet Take 1 tablet (100 mg total) by mouth 2 (two) times daily. Patient not taking: Reported on 07/01/2016 06/09/16   Loletha Grayer, MD  finasteride (PROSCAR) 5 MG tablet Take 5 mg by mouth daily.    Historical Provider, MD  fluticasone (FLONASE) 50 MCG/ACT nasal spray ONE  SPRAY IN EACH NOSTRIL AT NIGHT AS DIRECTED 07/01/15   Historical Provider, MD  fluticasone (FLOVENT HFA) 220 MCG/ACT inhaler Inhale into the lungs. 08/28/15 08/27/16  Historical Provider, MD  gabapentin (NEURONTIN) 300 MG capsule Take 300 mg by mouth 2 (two) times daily.  07/07/15   Historical Provider, MD  metoprolol (LOPRESSOR) 50 MG tablet Take 50 mg by mouth 2 (two) times daily.  07/30/15 07/29/16  Historical Provider, MD  morphine (ROXANOL) 20 MG/ML concentrated solution Take 0.5 mLs (10 mg total) by mouth every 6 (six) hours as needed for severe pain. 06/09/16   Loletha Grayer, MD  omeprazole (PRILOSEC) 20 MG capsule Take 1 capsule (20 mg total) by mouth daily. Patient taking differently: Take 20 mg by mouth 2 (two) times daily before a meal.  12/03/15   Lloyd Huger, MD  predniSONE (DELTASONE) 5 MG tablet 5 tabs po day1; 4 tabs po day2; 3 tabs po day3; 2 tabs po day4; 1 tab po day5 Patient not taking: Reported on 07/01/2016 06/09/16   Loletha Grayer, MD  tamsulosin (FLOMAX) 0.4 MG CAPS capsule Take 0.4 mg by mouth daily. 08/25/15   Historical  Provider, MD      VITAL SIGNS:  Blood pressure (!) 101/53, pulse 66, temperature 97.9 F (36.6 C), temperature source Oral, resp. rate 19, height 5\' 8"  (1.727 m), weight 83.9 kg (185 lb), SpO2 98 %.  PHYSICAL EXAMINATION:  Physical Exam  GENERAL:  76 y.o.-year-old patient lying in bed in no acute distress.  EYES: Pupils equal, round, reactive to light and accommodation. No scleral icterus. Extraocular muscles intact.  HEENT: Head atraumatic, normocephalic. Oropharynx and nasopharynx clear. No oropharyngeal erythema, moist oral mucosa  NECK:  Supple, no jugular venous distention. No thyroid enlargement, no tenderness.  LUNGS: Normal breath sounds bilaterally, no wheezing, rales, rhonchi. No use of accessory muscles of respiration.  CARDIOVASCULAR: S1, S2 RRR. No murmurs, rubs, gallops, clicks.  ABDOMEN: Soft, nontender, nondistended. Bowel sounds  present. No organomegaly or mass.  EXTREMITIES: No pedal edema, cyanosis, or clubbing. + 2 pedal & radial pulses b/l.   NEUROLOGIC: Cranial nerves II through XII are intact. No focal Motor or sensory deficits appreciated b/l. Globally weak.  PSYCHIATRIC: The patient is alert and oriented x 3.  SKIN: No obvious rash, lesion, or ulcer.   LABORATORY PANEL:   CBC  Recent Labs Lab 07/01/16 1549  WBC 6.8  HGB 13.9  HCT 40.7  PLT 158   ------------------------------------------------------------------------------------------------------------------  Chemistries   Recent Labs Lab 07/01/16 1549  NA 134*  K 4.7  CL 99*  CO2 27  GLUCOSE 121*  BUN 18  CREATININE 1.10  CALCIUM 8.9   ------------------------------------------------------------------------------------------------------------------  Cardiac Enzymes  Recent Labs Lab 07/01/16 1549  TROPONINI 0.04*   ------------------------------------------------------------------------------------------------------------------  RADIOLOGY:  Dg Chest 2 View  Result Date: 07/01/2016 CLINICAL DATA:  Shortness of breath and chills EXAM: CHEST  2 VIEW COMPARISON:  06/07/2016 FINDINGS: Right-sided central venous port tip overlies the proximal right atrium. No large pleural effusion. Subtle opacity in the right upper lobe. Stable mild cardiomegaly. No overt failure. No pneumothorax. 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 IMPRESSION: 1. Subtle opacity in the right upper lobe could relate to a mild infiltrate. 2. Stable mild cardiomegaly Electronically Signed   By: Donavan Foil M.D.   On: 07/01/2016 16:20   Ct Angio Chest Pe W And/or Wo Contrast  Result Date: 07/01/2016 CLINICAL DATA:  Shortness of breath and chills. Dyspnea. Esophageal cancer. EXAM: CT ANGIOGRAPHY CHEST WITH CONTRAST TECHNIQUE: Multidetector CT imaging of the chest was performed using the standard protocol during bolus administration of  intravenous contrast. Multiplanar CT image reconstructions and MIPs were obtained to evaluate the vascular anatomy. CONTRAST:  75 cc Isovue 370 COMPARISON:  Multiple exams, including 07/01/2016 and 05/29/2015 FINDINGS: Despite efforts by the technologist and patient, motion artifact is present on today's exam and could not be eliminated. This reduces exam sensitivity and specificity. Cardiovascular: No filling defect is identified in the pulmonary arterial tree to suggest pulmonary embolus. Sensitivity in some segments of the pulmonary arterial tree is reduced due to motion artifact. Aortoiliac atherosclerotic vascular disease. Mild cardiomegaly. There is poor contrast opacification in the systemic arterial structures and accordingly sensitivity for some systemic arterial vascular abnormalities is reduced. Mediastinum/Nodes: Scattered small mediastinal lymph nodes. Right hilar node 1.1 cm in short axis on image 44/4, mildly enlarged. Distal esophageal wall thickening with some irregularity, concerning for tumor in this vicinity. Lungs/Pleura: Mild airspace opacity posteriorly in the right upper lobe as on image 36/10, suspicious for pneumonia. Stable pleural calcifications posteriorly along the right upper lobe. There is some tiny pulmonary nodules  in the right lung which are stable and probably from old granulomatous disease. Bandlike atelectasis in the posterior basal segment left lower lobe. Upper Abdomen: Unremarkable Musculoskeletal: Old right lower lateral rib fracture, healed. Degenerative glenohumeral arthropathy bilaterally is partially included on today' s exam. Thoracic spondylosis and mild upper thoracic kyphosis. The thoracic degenerative disc disease at several levels. Review of the MIP images confirms the above findings. IMPRESSION: 1. No filling defect is identified in the pulmonary arterial tree to suggest pulmonary embolus. Mildly reduced sensitivity due to motion artifact. 2. Mild airspace opacity  posteriorly in the right upper lobe, suspicious for pneumonia. 3. Indistinct thickened distal esophagus, likely related to patient's known esophageal malignancy. 4. Mild right hilar adenopathy, this may be reactive. 5. Coronary, aortic arch, and branch vessel atherosclerotic vascular disease. 6. Mild cardiomegaly. 7. Mild atelectasis in the left lower lobe. Electronically Signed   By: Van Clines M.D.   On: 07/01/2016 17:08     IMPRESSION AND PLAN:   76 year old male with past medical history of esophageal cancer currently getting chemotherapy, hypertension, GERD, COPD, atrial fibrillation who presents to the hospital due to chills, rigors, shortness of breath.  1. Pneumonia-this is the cause of patient's shortness of breath and chills. Patient's CT scan is suggestive of a right upper lobe infiltrate suspicious for pneumonia. -We'll treat the patient with broad-spectrum IV antibiotics with vancomycin, Zosyn. Follow blood, sputum cultures. -Patient is clinically afebrile and hemodynamically stable, with a normal white cell count.  2. History of chronic atrial fibrillation-rate controlled. -Patient's blood pressures on the low side therefore we'll hold his Cardizem, Metoprolol. Continue digoxin. Continue Coumadin. Follow INR to the goal 1-2.  3. Essential hypertension-patient is presently hypotensive. Hold Cardizem, enalapril.  4. Hx of Esophogeal Cancer - currently getting treated w/ chemo/radiation. - Follows w/ Dr. Grayland Ormond.   5. COPD - no acute exacerbation.  - cont. Roxanol for air hunger.   6. Neuropathy - cont. Neurontin  7. BPH - cont. Flomax, Finasteride.    All the records are reviewed and case discussed with ED provider. Management plans discussed with the patient, family and they are in agreement.  CODE STATUS: Full Code  TOTAL TIME TAKING CARE OF THIS PATIENT: 40 minutes.    Henreitta Leber M.D on 07/01/2016 at 7:23 PM  Between 7am to 6pm - Pager -  [phone]  After 6pm go to [email or web address] - password [email or web address] [facility]  Office  [phone]  CC: Primary care physician; [name], MD

## 2016-07-01 NOTE — ED Notes (Signed)
Pt transport to 150

## 2016-07-01 NOTE — ED Triage Notes (Signed)
Brought in by ems for sob and chills. Pt states was dx with pneumonia

## 2016-07-01 NOTE — ED Provider Notes (Signed)
[facility] Emergency Department Provider Note        Time seen: ----------------------------------------- 3:58 PM on 07/01/2016 -----------------------------------------    I have reviewed the triage vital signs and the nursing notes.   HISTORY  Chief Complaint Shortness of Breath    HPI Jeremy Gordon is a 76 y.o. male who presents to ER brought by EMS for shortness of breath and chills. Patient states he was diagnosed with pneumonia and recently released from the hospital for same. Patient still reports shortness of breath, he's had chills but no fever. He also complains of severe generalized weakness.   Past Medical History:  Diagnosis Date  . Arthritis   . Atrial fibrillation ([place])   . COPD (chronic obstructive pulmonary disease) ([place])   . Dysrhythmia   . Esophageal cancer ([name])   . Esophageal carcinoma ([place])    a. Chemo: FOLFIRI  . Essential hypertension   . GERD (gastroesophageal reflux disease)   . History of stress test    a. 06/2003 MV: EF 56%, no ischemia/infarct.  . Persistent atrial fibrillation ([place])    a. 07/2013 Echo: EF 60-65%, mod dil LA, mild TR;  CHA2DS2VASc = 3-->chronic coumadin.  . Sleep apnea     Patient Active Problem List   Diagnosis Date Noted  . Weakness 06/07/2016  . Goals of care, counseling/discussion 05/16/2016  . Closed right hip fracture ([place]) 01/15/2016  . Malignant neoplasm of lower third of esophagus ([place]) 09/10/2015  . Pneumonitis due to inhalation of food or vomitus ([place]) 08/24/2015  . Persistent atrial fibrillation ([place]) 01/29/2015  . Ventricular tachycardia ([place]) 01/29/2015  . Sepsis ([place]) 01/29/2015  . Essential hypertension 01/29/2015  . Hypokalemia 01/29/2015  . Hypomagnesemia 01/29/2015  . Systemic infection ([place]) 01/29/2015  . Atrial fibrillation ([place])   . Aspiration pneumonia of right lower lobe ([place])   . Pneumonia 01/26/2015  . PNA (pneumonia) 01/26/2015  . Chest wall pain 10/08/2014  .  Chest pain 10/08/2014  . Dysphagia 02/05/2014  . Apnea, sleep 10/09/2013  . Carpal tunnel syndrome 03/11/2013  . Sciatica 11/23/2012  . Acid reflux 11/23/2012  . Gastro-esophageal reflux disease without esophagitis 11/23/2012  . 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 Enlarged prostate 10/15/2012  . Allergic rhinitis 08/15/2012  . Back ache 08/15/2012  . Atrial fibrillation, chronic ([place]) 08/15/2012  . CAFL (chronic airflow limitation) ([place]) 08/15/2012  . Breathlessness on exertion 08/15/2012  . BP (high blood pressure) 08/15/2012  . Malaise and fatigue 08/15/2012  . 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 Arthritis of knee, degenerative 08/15/2012  . 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 Lumbar canal stenosis 08/15/2012  . Chronic obstructive pulmonary disease ([place]) 08/15/2012  . Dorsalgia 08/15/2012  . Essential (primary) hypertension 08/15/2012  . Breath shortness 08/15/2012  . Does not feel right 08/15/2012  . Post viral syndrome 08/15/2012  . 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 ESOPHAGOGASTRODUODENOSCOPY (EGD) WITH PROPOFOL N/A 08/31/2015   Procedure: ESOPHAGOGASTRODUODENOSCOPY (EGD) WITH PROPOFOL;  Surgeon: Manya Silvas, MD;  Location: [facility] ENDOSCOPY;  Service: Endoscopy;  Laterality: N/A;  . ESOPHAGOGASTRODUODENOSCOPY (EGD) WITH PROPOFOL N/A 02/26/2016   Procedure: ESOPHAGOGASTRODUODENOSCOPY (EGD) WITH PROPOFOL;  Surgeon: Manya Silvas, MD;  Location: [facility] ENDOSCOPY;  Service: Endoscopy;  Laterality: N/A;  . FEMUR IM NAIL Right 01/17/2016   Procedure: INTRAMEDULLARY (IM) NAIL FEMORAL;  Surgeon: Earnestine Leys, MD;  Location: [facility] ORS;  Service: Orthopedics;  Laterality: Right;  .  HERNIA REPAIR    . PERIPHERAL VASCULAR CATHETERIZATION N/A 11/20/2014   Procedure: Glori Luis Cath Insertion;  Surgeon: Algernon Huxley, MD;  Location: [place] CV LAB;  Service: Cardiovascular;   Laterality: N/A;    Allergies Patient has no known allergies.  Social History Social History  Substance Use Topics  . Smoking status: Current Some Day Smoker    Years: 62.00  . Smokeless tobacco: Current User    Types: Snuff  . Alcohol use No    Review of Systems Constitutional: Negative for fever. Cardiovascular: Negative for chest pain. Respiratory: Positive shortness of breath Gastrointestinal: Negative for abdominal pain, vomiting and diarrhea. Genitourinary: Negative for dysuria. Musculoskeletal: Negative for back pain. Skin: Negative for rash. Neurological: Negative for headaches, focal weakness or numbness. Positive for weakness  10-point ROS otherwise negative.  ____________________________________________   PHYSICAL EXAM:  VITAL SIGNS: ED Triage Vitals [07/01/16 1523]  Enc Vitals Group     BP (!) 85/40     Pulse Rate (!) 101     Resp 20     Temp 97.1 F (36.2 C)     Temp src      SpO2 92 %     Weight 185 lb (83.9 kg)     Height 5\' 8"  (1.727 m)     Head Circumference      Peak Flow      Pain Score 6     Pain Loc      Pain Edu?      Excl. in [place]?     Constitutional: Alert and oriented. Mild distress Eyes: Conjunctivae are normal. PERRL. Normal extraocular movements. ENT   Head: Normocephalic and atraumatic.   Nose: No congestion/rhinnorhea.   Mouth/Throat: Mucous membranes are moist.   Neck: No stridor. Cardiovascular: Normal rate, regular rhythm. No murmurs, rubs, or gallops. Respiratory: Normal respiratory effort without tachypnea nor retractions. Breath sounds are clear and equal bilaterally. No wheezes/rales/rhonchi. Gastrointestinal: Soft and nontender. Normal bowel sounds Musculoskeletal: Nontender with normal range of motion in all extremities. No lower extremity tenderness nor edema. Neurologic:  Normal speech and language. No gross focal neurologic deficits are appreciated. Generalized weakness Skin:  Skin is warm, dry and  intact. No rash noted. Psychiatric: Mood and affect are normal. Speech and behavior are normal.  ____________________________________________  EKG: Interpreted by me. Atrial fibrillation with a rate of 89 bpm, normal QRS size, normal QT, incomplete right bundle-branch block  ____________________________________________  ED COURSE:  Pertinent labs & imaging results that were available during my care of the patient were reviewed by me and considered in my medical decision making (see chart for details). Patient presents to ER with weakness and shortness of breath. We will assess with labs and imaging.   Procedures ____________________________________________   LABS (pertinent positives/negatives)  Labs Reviewed  BASIC METABOLIC PANEL - Abnormal; Notable for the following:       Result Value   Sodium 134 (*)    Chloride 99 (*)    Glucose, Bld 121 (*)    All other components within normal limits  CBC - Abnormal; Notable for the following:    RDW 19.7 (*)    All other components within normal limits  TROPONIN I - Abnormal; Notable for the following:    Troponin I 0.04 (*)    All other components within normal limits  LACTIC ACID, PLASMA - Abnormal; Notable for the following:    Lactic Acid, Venous 2.7 (*)    All other components within normal limits  URINALYSIS, COMPLETE (UACMP) WITH MICROSCOPIC - Abnormal; Notable for the following:    Color, Urine YELLOW (*)    APPearance CLEAR (*)    Specific Gravity, Urine 1.033 (*)    Ketones, ur 5 (*)    Squamous Epithelial / LPF 0-5 (*)    All other components within normal limits  CULTURE, BLOOD (ROUTINE X 2)  CULTURE, BLOOD (ROUTINE X 2)  LACTIC ACID, PLASMA  LACTIC ACID, PLASMA    RADIOLOGY Images were viewed by me  Chest x-ray IMPRESSION: 1. No filling defect is identified in the pulmonary arterial tree to suggest pulmonary embolus. Mildly reduced sensitivity due to motion artifact. 2. Mild airspace opacity posteriorly in  the right upper lobe, suspicious for pneumonia. 3. Indistinct thickened distal esophagus, likely related to patient's known esophageal malignancy. 4. Mild right hilar adenopathy, this may be reactive. 5. Coronary, aortic arch, and branch vessel atherosclerotic vascular disease. 6. Mild cardiomegaly. 7. Mild atelectasis in the left lower lobe. IMPRESSION: 1. Subtle opacity in the right upper lobe could relate to a mild infiltrate. 2. Stable mild cardiomegaly  CRITICAL CARE Performed by: Earleen Newport   Total critical care time: 30 minutes  Critical care time was exclusive of separately billable procedures and treating other patients.  Critical care was necessary to treat or prevent imminent or life-threatening deterioration.  Critical care was time spent personally by me on the following activities: development of treatment plan with patient and/or surrogate as well as nursing, discussions with consultants, evaluation of patient's response to treatment, examination of patient, obtaining history from patient or surrogate, ordering and performing treatments and interventions, ordering and review of laboratory studies, ordering and review of radiographic studies, pulse oximetry and re-evaluation of patient's condition.  ____________________________________________  FINAL ASSESSMENT AND PLAN  Weakness, possible pneumonia, mild lactic acidosis, hypotension  Plan: Patient with labs and imaging as dictated above. Patient presented to the ER with shortness of breath, chills and a recent diagnosis of pneumonia. Clinically he may be developing pneumonia again. Unclear if this is aspiration or what the etiology is. Temporarily his blood pressure had improved with a fluid bolus but subsequently dropped again. We ordered broad-spectrum antibiotics to cover for pneumonia again. I will discuss with the hospitalist for admission.   Earleen Newport, MD   Note: This note was  generated in part or whole with voice recognition software. Voice recognition is usually quite accurate but there are transcription errors that can and very often do occur. I apologize for any typographical errors that were not detected and corrected.     Earleen Newport, MD 07/01/16 [phone]

## 2016-07-01 NOTE — ED Notes (Signed)
This RN and charge RN attempted to access patients port, unable to get port to flush or draw back blood. Peripheral IV started.

## 2016-07-01 NOTE — Progress Notes (Addendum)
ANTICOAGULATION CONSULT NOTE - Initial Consult  Pharmacy Consult for warfarin dosing  Indication: atrial fibrillation  No Known Allergies  Patient Measurements: Height: 5\' 8"  (172.7 cm) Weight: 185 lb (83.9 kg) IBW/kg (Calculated) : 68.4  Vital Signs: Temp: 97.9 F (36.6 C) (03/16 1920) Temp Source: Oral (03/16 1920) BP: 93/59 (03/16 1930) Pulse Rate: 81 (03/16 1930)  Labs:  Recent Labs  07/01/16 1549 07/01/16 1955  HGB 13.9  --   HCT 40.7  --   PLT 158  --   LABPROT  --  32.8*  INR  --  3.12  CREATININE 1.10  --   TROPONINI 0.04*  --     Estimated Creatinine Clearance: 58.4 mL/min (by C-G formula based on SCr of 1.1 mg/dL).   Medical History: Past Medical History:  Diagnosis Date  . Arthritis   . Atrial fibrillation ([place])   . COPD (chronic obstructive pulmonary disease) ([place])   . Dysrhythmia   . Esophageal cancer ([place])   . Esophageal carcinoma ([place])    a. Chemo: FOLFIRI  . Essential hypertension   . GERD (gastroesophageal reflux disease)   . History of stress test    a. 06/2003 MV: EF 56%, no ischemia/infarct.  . Persistent atrial fibrillation ([place])    a. 07/2013 Echo: EF 60-65%, mod dil LA, mild TR;  CHA2DS2VASc = 3-->chronic coumadin.  . Sleep apnea     Assessment: 76 yo male with PMH of A. FIb,  admitted with PNA. Pharmacy consulted for warfarin dosing and monitoring.   Home Regimen: Warfarin 8mg  daily  DATE  INR DOSE 3/16  3.12 5mg   Goal of Therapy:  INR 2-3 Monitor platelets by anticoagulation protocol: Yes   Plan:  Patient slightly supratherapeutic. Patient is also  receiving antibiotics. Will order 1 time dose of warfarin 5mg  tonight. Will check INRs daily while on Abxs  Pernell Dupre, PharmD, BCPS Clinical Pharmacist 07/01/2016 8:28 PM

## 2016-07-02 DIAGNOSIS — J690 Pneumonitis due to inhalation of food and vomit: Secondary | ICD-10-CM | POA: Diagnosis not present

## 2016-07-02 LAB — TROPONIN I
Troponin I: 0.04 ng/mL (ref <0.03)
Troponin I: 0.05 ng/mL (ref <0.03)

## 2016-07-02 LAB — BASIC METABOLIC PANEL
ANION GAP: 6 (ref 5–15)
BUN: 16 mg/dL (ref 6–20)
CHLORIDE: 104 mmol/L (ref 101–111)
CO2: 24 mmol/L (ref 22–32)
Calcium: 7.8 mg/dL — ABNORMAL LOW (ref 8.9–10.3)
Creatinine, Ser: 0.87 mg/dL (ref 0.61–1.24)
GFR calc non Af Amer: >60 mL/min (ref >60)
Glucose, Bld: 104 mg/dL — ABNORMAL HIGH (ref 65–99)
POTASSIUM: 3.9 mmol/L (ref 3.5–5.1)
Sodium: 134 mmol/L — ABNORMAL LOW (ref 135–145)

## 2016-07-02 LAB — CBC
HEMATOCRIT: 32.5 % — AB (ref 40.0–52.0)
HEMOGLOBIN: 11.4 g/dL — AB (ref 13.0–18.0)
MCH: 31 pg (ref 26.0–34.0)
MCHC: 35 g/dL (ref 32.0–36.0)
MCV: 88.6 fL (ref 80.0–100.0)
Platelets: 125 10*3/uL — ABNORMAL LOW (ref 150–440)
RBC: 3.66 MIL/uL — AB (ref 4.40–5.90)
RDW: 19 % — ABNORMAL HIGH (ref 11.5–14.5)
WBC: 5 10*3/uL (ref 3.8–10.6)

## 2016-07-02 LAB — PROTIME-INR
INR: 3.04
Prothrombin Time: 32.1 seconds — ABNORMAL HIGH (ref 11.4–15.2)

## 2016-07-02 MED ORDER — CARVEDILOL 3.125 MG PO TABS
3.1250 mg | ORAL_TABLET | Freq: Two times a day (BID) | ORAL | Status: DC
  Administered 2016-07-03: 3.125 mg via ORAL
  Filled 2016-07-02: qty 1

## 2016-07-02 MED ORDER — SUCRALFATE 1 GM/10ML PO SUSP: 1.0000 g | Freq: Three times a day (TID) | ORAL | Status: DC

## 2016-07-02 MED ORDER — SUCRALFATE 1 GM/10ML PO SUSP
1.0000 g | Freq: Four times a day (QID) | ORAL | Status: DC
  Administered 2016-07-02 – 2016-07-05 (×10): 1 g via ORAL
  Filled 2016-07-02 (×11): qty 10

## 2016-07-02 MED ORDER — WARFARIN SODIUM 2.5 MG PO TABS
5.0000 mg | ORAL_TABLET | Freq: Once | ORAL | Status: AC
Start: 2016-07-02 — End: 2016-07-02
  Administered 2016-07-02: 5 mg via ORAL
  Filled 2016-07-02: qty 2

## 2016-07-02 NOTE — Progress Notes (Signed)
PT Cancellation Note  Patient Details Name: RETT STEHLIK MRN: [id number] DOB: 08/30/1940   Cancelled Treatment:    Reason Eval/Treat Not Completed: Other (comment) (lethargic after getting meds for nausea).  Tried x 2 for PT evaluation and will try again tomorrow.   Ramond Dial 07/02/2016, 3:19 PM   Mee Hives, PT MS Acute Rehab Dept. Number: [place] and [place]

## 2016-07-02 NOTE — Progress Notes (Signed)
PT Cancellation Note  Patient Details Name: Jeremy Gordon MRN: [id number] DOB: 29-May-1940   Cancelled Treatment:    Reason Eval/Treat Not Completed: Other (comment) (Nauseated and refused this AM, try later)   Ramond Dial 07/02/2016, 10:21 AM   Mee Hives, PT MS Acute Rehab Dept. Number: [place] and [place]

## 2016-07-02 NOTE — Progress Notes (Signed)
Alert and oriented. Receiving IV abx for pnuemonia. Pt feels as though he has to vomit during his meal. He has hx of esophageal cancer and the nausea/vomiting could be related to the cancer. He rested quietly during the night with no complaints of pain. Staff will continue to monitor.

## 2016-07-02 NOTE — Care Management Obs Status (Addendum)
[place] NOTIFICATION   Patient Details  Name: Jeremy Gordon MRN: [id number] Date of Birth: [birth year]   Medicare Observation Status Notification Given:  Yes, Mr Monje did not appear to comprehend this [place] letter. Left message on son Neil's phone to please call this writer so that I can discuss Medicare Observation status with a family member.     Doral Digangi A, RN 07/02/2016, 3:33 PM

## 2016-07-02 NOTE — Plan of Care (Signed)
Problem: Nutrition: Goal: Adequate nutrition will be maintained Outcome: Progressing Patient was changed to full liquid diet, added carafate to medications. Patient was able to eat dinner this shift. No nausea or vomiting after.

## 2016-07-02 NOTE — Progress Notes (Signed)
ANTICOAGULATION CONSULT NOTE -   Pharmacy Consult for warfarin dosing  Indication: atrial fibrillation  No Known Allergies  Patient Measurements: Height: 5\' 8"  (172.7 cm) Weight: 185 lb (83.9 kg) IBW/kg (Calculated) : 68.4  Vital Signs: Temp: 97.6 F (36.4 C) (03/17 0738) Temp Source: Oral (03/17 0738) BP: 104/53 (03/17 0738) Pulse Rate: 76 (03/17 0738)  Labs:  Recent Labs  07/01/16 1549 07/01/16 1955 07/01/16 2111 07/02/16 0102 07/02/16 0519  HGB 13.9  --   --   --  11.4*  HCT 40.7  --   --   --  32.5*  PLT 158  --   --   --  125*  LABPROT  --  32.8*  --   --  32.1*  INR  --  3.12  --   --  3.04  CREATININE 1.10  --   --   --  0.87  TROPONINI 0.04*  --  0.05* 0.04* 0.05*    Estimated Creatinine Clearance: 73.8 mL/min (by C-G formula based on SCr of 0.87 mg/dL).   Medical History: Past Medical History:  Diagnosis Date  . Arthritis   . Atrial fibrillation ([place])   . COPD (chronic obstructive pulmonary disease) ([place])   . Dysrhythmia   . Esophageal cancer ([place])   . Esophageal carcinoma ([place])    a. Chemo: FOLFIRI  . Essential hypertension   . GERD (gastroesophageal reflux disease)   . History of stress test    a. 06/2003 MV: EF 56%, no ischemia/infarct.  . Persistent atrial fibrillation ([place])    a. 07/2013 Echo: EF 60-65%, mod dil LA, mild TR;  CHA2DS2VASc = 3-->chronic coumadin.  . Sleep apnea     Assessment: 76 yo male with PMH of A. FIb,  admitted with PNA. Pharmacy consulted for warfarin dosing and monitoring.   Home Regimen: Warfarin 8mg  daily  DATE  INR DOSE 3/16  3.12 5 mg 3/17  3.04  Goal of Therapy:  INR 2-3 Monitor platelets by anticoagulation protocol: Yes   Plan:  Patient still slightly supratherapeutic. Patient is also  receiving antibiotics. Will order another 1 time dose of warfarin 5 mg tonight. Will check INRs daily while on Abxs  Noralee Space, PharmD, BCPS Clinical Pharmacist 07/02/2016 9:55 AM

## 2016-07-02 NOTE — Progress Notes (Addendum)
[facility] [place] NAME: Jeremy Gordon    MR#:  [id number]  DATE OF BIRTH:  [birth year]  SUBJECTIVE:  CHIEF COMPLAINT:   Chief Complaint  Patient presents with  . Shortness of Breath  The patient is 76 year old Caucasian male with past medical history significant for history of recent admission for pneumonia, after which patient was discharged to skilled nursing facility and discharge back home about 2 days ago, who presents back to the hospital with complaints of poor oral intake, nausea, vomiting, epigastric abdominal pain, chest x-ray revealed recurrent pneumonia and he was admitted. He remains very uncomfortable, complains of epigastric abdominal discomfort, nausea, intermittent vomiting. His troponin was found to be elevated 0.04-0.05. His sodium was found to be low as well, felt to be dehydration related. No other cell count elevation, but mild anemia. No bleeding was noted. Lactic acid level was found to be elevated at 2.7, improved with IV fluid administration.  Review of Systems  Constitutional: Negative for chills, fever and weight loss.  HENT: Negative for congestion.   Eyes: Negative for blurred vision and double vision.  Respiratory: Positive for cough, sputum production and shortness of breath. Negative for wheezing.   Cardiovascular: Negative for chest pain, palpitations, orthopnea, leg swelling and PND.  Gastrointestinal: Positive for abdominal pain, nausea and vomiting. Negative for blood in stool, constipation and diarrhea.  Genitourinary: Negative for dysuria, frequency, hematuria and urgency.  Musculoskeletal: Negative for falls.  Neurological: Negative for dizziness, tremors, focal weakness and headaches.  Endo/Heme/Allergies: Does not bruise/bleed easily.  Psychiatric/Behavioral: Negative for depression. The patient does not have insomnia.     VITAL SIGNS: Blood pressure 120/70, pulse 87, temperature 98.3 F  (36.8 C), temperature source Axillary, resp. rate 20, height 5\' 8"  (1.727 m), weight 83.9 kg (185 lb), SpO2 98 %.  PHYSICAL EXAMINATION:   GENERAL:  76 y.o.-year-old patient lying in the bed in moderate distress due to epigastric no pain, nausea.  EYES: Pupils equal, round, reactive to light and accommodation. No scleral icterus. Extraocular muscles intact.  HEENT: Head atraumatic, normocephalic. Oropharynx and nasopharynx clear.  NECK:  Supple, no jugular venous distention. No thyroid enlargement, no tenderness.  LUNGS: Normal breath sounds bilaterally, no wheezing, rales,rhonchi , few scattered right-sided crepitations noted anteriorly. No use of accessory muscles of respiration.  CARDIOVASCULAR: S1, S2 , regularly irregular. No murmurs, rubs, or gallops.  ABDOMEN: Soft, tender in the epigastric area, but no rebound or guarding,, nondistended. Bowel sounds present. No organomegaly or mass.  EXTREMITIES: No pedal edema, cyanosis, or clubbing.  NEUROLOGIC: Cranial nerves II through XII are intact. Muscle strength 5/5 in all extremities. Sensation intact. Gait not checked.  PSYCHIATRIC: The patient is alert and oriented x 3.  SKIN: No obvious rash, lesion, or ulcer.   ORDERS/RESULTS REVIEWED:   CBC  Recent Labs Lab 07/01/16 1549 07/02/16 0519  WBC 6.8 5.0  HGB 13.9 11.4*  HCT 40.7 32.5*  PLT 158 125*  MCV 88.4 88.6  MCH 30.1 31.0  MCHC 34.0 35.0  RDW 19.7* 19.0*   ------------------------------------------------------------------------------------------------------------------  Chemistries   Recent Labs Lab 07/01/16 1549 07/02/16 0519  NA 134* 134*  K 4.7 3.9  CL 99* 104  CO2 27 24  GLUCOSE 121* 104*  BUN 18 16  CREATININE 1.10 0.87  CALCIUM 8.9 7.8*   ------------------------------------------------------------------------------------------------------------------ estimated creatinine clearance is 73.8 mL/min (by C-G formula based on SCr of 0.87  mg/dL). ------------------------------------------------------------------------------------------------------------------ No results for input(s): TSH, T4TOTAL,  T3FREE, THYROIDAB in the last 72 hours.  Invalid input(s): FREET3  Cardiac Enzymes  Recent Labs Lab 07/01/16 2111 07/02/16 0102 07/02/16 0519  TROPONINI 0.05* 0.04* 0.05*   ------------------------------------------------------------------------------------------------------------------ Invalid input(s): POCBNP ---------------------------------------------------------------------------------------------------------------  RADIOLOGY: Dg Chest 2 View  Result Date: 07/01/2016 CLINICAL DATA:  Shortness of breath and chills EXAM: CHEST  2 VIEW COMPARISON:  06/07/2016 FINDINGS: Right-sided central venous port tip overlies the proximal right atrium. No large pleural effusion. Subtle opacity in the right upper lobe. Stable mild cardiomegaly. No overt failure. No pneumothorax. 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 IMPRESSION: 1. Subtle opacity in the right upper lobe could relate to a mild infiltrate. 2. Stable mild cardiomegaly Electronically Signed   By: Donavan Foil M.D.   On: 07/01/2016 16:20   Ct Angio Chest Pe W And/or Wo Contrast  Result Date: 07/01/2016 CLINICAL DATA:  Shortness of breath and chills. Dyspnea. Esophageal cancer. EXAM: CT ANGIOGRAPHY CHEST WITH CONTRAST TECHNIQUE: Multidetector CT imaging of the chest was performed using the standard protocol during bolus administration of intravenous contrast. Multiplanar CT image reconstructions and MIPs were obtained to evaluate the vascular anatomy. CONTRAST:  75 cc Isovue 370 COMPARISON:  Multiple exams, including 07/01/2016 and 05/29/2015 FINDINGS: Despite efforts by the technologist and patient, motion artifact is present on today's exam and could not be eliminated. This reduces exam sensitivity and specificity. Cardiovascular: No filling  defect is identified in the pulmonary arterial tree to suggest pulmonary embolus. Sensitivity in some segments of the pulmonary arterial tree is reduced due to motion artifact. Aortoiliac atherosclerotic vascular disease. Mild cardiomegaly. There is poor contrast opacification in the systemic arterial structures and accordingly sensitivity for some systemic arterial vascular abnormalities is reduced. Mediastinum/Nodes: Scattered small mediastinal lymph nodes. Right hilar node 1.1 cm in short axis on image 44/4, mildly enlarged. Distal esophageal wall thickening with some irregularity, concerning for tumor in this vicinity. Lungs/Pleura: Mild airspace opacity posteriorly in the right upper lobe as on image 36/10, suspicious for pneumonia. Stable pleural calcifications posteriorly along the right upper lobe. There is some tiny pulmonary nodules in the right lung which are stable and probably from old granulomatous disease. Bandlike atelectasis in the posterior basal segment left lower lobe. Upper Abdomen: Unremarkable Musculoskeletal: Old right lower lateral rib fracture, healed. Degenerative glenohumeral arthropathy bilaterally is partially included on today' s exam. Thoracic spondylosis and mild upper thoracic kyphosis. The thoracic degenerative disc disease at several levels. Review of the MIP images confirms the above findings. IMPRESSION: 1. No filling defect is identified in the pulmonary arterial tree to suggest pulmonary embolus. Mildly reduced sensitivity due to motion artifact. 2. Mild airspace opacity posteriorly in the right upper lobe, suspicious for pneumonia. 3. Indistinct thickened distal esophagus, likely related to patient's known esophageal malignancy. 4. Mild right hilar adenopathy, this may be reactive. 5. Coronary, aortic arch, and branch vessel atherosclerotic vascular disease. 6. Mild cardiomegaly. 7. Mild atelectasis in the left lower lobe. Electronically Signed   By: Van Clines M.D.    On: 07/01/2016 17:08    EKG:  Orders placed or performed during the hospital encounter of 07/01/16  . ED EKG within 10 minutes  . ED EKG within 10 minutes    ASSESSMENT AND PLAN:  Active Problems:   Pneumonia  #1. Right upper lobe healthcare associated pneumonia, concerning for aspiration pneumonitis, continue patient on broad-spectrum antibiotic therapy, get sputum cultures if possible, MRSA PCR was negative, discontinue vancomycin, continue oxygen therapy, nebulizers #2. Atrial fibrillation, rate  controlled. Continue Coumadin therapy, follow closely #3. Epigastric abdominal pain, patient had EGD in November 2017, revealing friable esophagus, initiate patient on Carafate, continue Protonix, follow closely clinically, change diet to full liquids, advance as tolerated #4. Hyponatremia, continue IV fluids, follow sodium level in the morning #5 elevated troponin, echocardiogram in September 2017 revealed ejection fraction of 35%, getting cardiologist involved. Recommendations, initiate patient on low-dose of Coreg, may not be able to use ACE inhibitor due to relative hypotension #6. Lactic acidosis, resolved #7. Anemia, no bleeding noted, follow hemoglobin level in the morning, patient is on Coumadin therapy, following INR closely, supratherapeutic #8 failure to thrive, adult, change diet to full liquid diet, initiate Carafate, may need to get gastroenterologist to see patient in consultation, as patient has been having problems with poor oral intake for the past few weeks or longer. Patient has known esophageal cancer, which could be obstructive, get barium swallowing study done   Management plans discussed with the patient, family and they are in agreement.   DRUG ALLERGIES: No Known Allergies  CODE STATUS:     Code Status Orders        Start     Ordered   07/01/16 2054  Full code  Continuous     07/01/16 2053    Code Status History    Date Active Date Inactive Code Status  Order ID Comments User Context   06/07/2016  6:02 PM 06/09/2016  6:46 PM Full Code [id number]  Dustin Flock, MD Inpatient   01/17/2016 10:54 AM 01/19/2016  8:02 PM Full Code [id number]  Earnestine Leys, MD Inpatient   01/15/2016  3:36 PM 01/15/2016 10:25 PM Full Code [id number]  Demetrios Loll, MD Inpatient   01/26/2015  6:21 PM 02/02/2015  6:18 PM Full Code [id number]  Vaughan Basta, MD ED      TOTAL TIME TAKING CARE OF THIS PATIENT: 40 minutes.    Theodoro Grist M.D on 07/02/2016 at 2:32 PM  Between 7am to 6pm - Pager - [phone]  After 6pm go to [email or web address] - password [email or web address] [facility]  Office  [phone]  CC: Primary care physician; [name], MD

## 2016-07-03 DIAGNOSIS — K219 Gastro-esophageal reflux disease without esophagitis: Secondary | ICD-10-CM | POA: Diagnosis not present

## 2016-07-03 DIAGNOSIS — I482 Chronic atrial fibrillation: Secondary | ICD-10-CM

## 2016-07-03 DIAGNOSIS — J189 Pneumonia, unspecified organism: Secondary | ICD-10-CM | POA: Diagnosis not present

## 2016-07-03 DIAGNOSIS — J449 Chronic obstructive pulmonary disease, unspecified: Secondary | ICD-10-CM

## 2016-07-03 DIAGNOSIS — Z923 Personal history of irradiation: Secondary | ICD-10-CM

## 2016-07-03 DIAGNOSIS — R531 Weakness: Secondary | ICD-10-CM

## 2016-07-03 DIAGNOSIS — R630 Anorexia: Secondary | ICD-10-CM | POA: Diagnosis not present

## 2016-07-03 DIAGNOSIS — Z9221 Personal history of antineoplastic chemotherapy: Secondary | ICD-10-CM | POA: Diagnosis not present

## 2016-07-03 DIAGNOSIS — I4891 Unspecified atrial fibrillation: Secondary | ICD-10-CM | POA: Diagnosis not present

## 2016-07-03 DIAGNOSIS — R1310 Dysphagia, unspecified: Secondary | ICD-10-CM

## 2016-07-03 DIAGNOSIS — J690 Pneumonitis due to inhalation of food and vomit: Secondary | ICD-10-CM | POA: Diagnosis not present

## 2016-07-03 DIAGNOSIS — C155 Malignant neoplasm of lower third of esophagus: Secondary | ICD-10-CM

## 2016-07-03 DIAGNOSIS — M129 Arthropathy, unspecified: Secondary | ICD-10-CM

## 2016-07-03 DIAGNOSIS — I248 Other forms of acute ischemic heart disease: Secondary | ICD-10-CM | POA: Diagnosis not present

## 2016-07-03 DIAGNOSIS — F17210 Nicotine dependence, cigarettes, uncomplicated: Secondary | ICD-10-CM | POA: Diagnosis not present

## 2016-07-03 DIAGNOSIS — Z79899 Other long term (current) drug therapy: Secondary | ICD-10-CM

## 2016-07-03 DIAGNOSIS — Z7901 Long term (current) use of anticoagulants: Secondary | ICD-10-CM

## 2016-07-03 DIAGNOSIS — I428 Other cardiomyopathies: Secondary | ICD-10-CM | POA: Diagnosis not present

## 2016-07-03 DIAGNOSIS — G4730 Sleep apnea, unspecified: Secondary | ICD-10-CM | POA: Diagnosis not present

## 2016-07-03 LAB — BASIC METABOLIC PANEL
ANION GAP: 3 — AB (ref 5–15)
BUN: 15 mg/dL (ref 6–20)
CALCIUM: 8 mg/dL — AB (ref 8.9–10.3)
CO2: 28 mmol/L (ref 22–32)
Chloride: 108 mmol/L (ref 101–111)
Creatinine, Ser: 0.84 mg/dL (ref 0.61–1.24)
GLUCOSE: 89 mg/dL (ref 65–99)
Potassium: 3.3 mmol/L — ABNORMAL LOW (ref 3.5–5.1)
Sodium: 139 mmol/L (ref 135–145)

## 2016-07-03 LAB — CBC
HCT: 31.5 % — ABNORMAL LOW (ref 40.0–52.0)
HEMOGLOBIN: 10.9 g/dL — AB (ref 13.0–18.0)
MCH: 31 pg (ref 26.0–34.0)
MCHC: 34.7 g/dL (ref 32.0–36.0)
MCV: 89.3 fL (ref 80.0–100.0)
PLATELETS: 111 10*3/uL — AB (ref 150–440)
RBC: 3.52 MIL/uL — AB (ref 4.40–5.90)
RDW: 19.1 % — ABNORMAL HIGH (ref 11.5–14.5)
WBC: 2.8 10*3/uL — ABNORMAL LOW (ref 3.8–10.6)

## 2016-07-03 LAB — PROTIME-INR
INR: 3.31
Prothrombin Time: 34.4 seconds — ABNORMAL HIGH (ref 11.4–15.2)

## 2016-07-03 MED ORDER — ALBUTEROL SULFATE (2.5 MG/3ML) 0.083% IN NEBU
3.0000 mL | INHALATION_SOLUTION | Freq: Three times a day (TID) | RESPIRATORY_TRACT | Status: DC
  Administered 2016-07-03 – 2016-07-04 (×6): 3 mL via RESPIRATORY_TRACT
  Filled 2016-07-03 (×8): qty 3

## 2016-07-03 MED ORDER — WARFARIN SODIUM 2.5 MG PO TABS
3.5000 mg | ORAL_TABLET | Freq: Once | ORAL | Status: AC
  Administered 2016-07-03: 3.5 mg via ORAL
  Filled 2016-07-03 (×2): qty 1

## 2016-07-03 MED ORDER — ALBUTEROL SULFATE (2.5 MG/3ML) 0.083% IN NEBU: 2.5000 mg | INHALATION_SOLUTION | Freq: Four times a day (QID) | RESPIRATORY_TRACT | Status: DC | PRN

## 2016-07-03 MED ORDER — OXYCODONE HCL 20 MG/ML PO CONC: 10.0000 mg | ORAL | Status: DC | PRN

## 2016-07-03 MED ORDER — POTASSIUM CHLORIDE CRYS ER 20 MEQ PO TBCR
40.0000 meq | EXTENDED_RELEASE_TABLET | Freq: Once | ORAL | Status: AC
  Administered 2016-07-03: 40 meq via ORAL
  Filled 2016-07-03: qty 2

## 2016-07-03 MED ORDER — POLYETHYLENE GLYCOL 3350 17 G PO PACK
17.0000 g | PACK | Freq: Every day | ORAL | Status: DC
  Administered 2016-07-03 – 2016-07-05 (×2): 17 g via ORAL
  Filled 2016-07-03 (×2): qty 1

## 2016-07-03 MED ORDER — POTASSIUM CHLORIDE IN NACL 20-0.9 MEQ/L-% IV SOLN
INTRAVENOUS | Status: DC
  Administered 2016-07-03: 09:00:00 via INTRAVENOUS
  Filled 2016-07-03 (×2): qty 1000

## 2016-07-03 NOTE — Progress Notes (Signed)
Jeremy Gordon at [place] NAME: Jeremy Gordon    MR#:  [id number]  DATE OF BIRTH:  01/20/1941  SUBJECTIVE:  CHIEF COMPLAINT:   Chief Complaint  Patient presents with  . Shortness of Breath  The patient is 76 year old Caucasian male with past medical history significant for history of recent admission for pneumonia, after which patient was discharged to skilled nursing facility and discharge back home about 2 days ago, who presents back to the hospital with complaints of poor oral intake, nausea, vomiting, epigastric abdominal pain, chest x-ray revealed recurrent pneumonia and he was admitted. He remains very uncomfortable, complains of epigastric abdominal discomfort, nausea, intermittent vomiting. His troponin was found to be elevated 0.04-0.05. His sodium was found to be low as well, felt to be dehydration related. No other cell count elevation, but mild anemia. No bleeding was noted. Lactic acid level was found to be elevated at 2.7, improved with IV fluid administration. Patient feels better today, still complains of epigastric abdominal pain, able to swallow liquids, however, not solids. No significant shortness of breath, or sputum production. He was seen by cardiologist, who recommended to continue beta blockers, possibly discontinue digoxin. Cardiologist felt that patient's mild elevation of troponin due to demand ischemia due to pneumonia, sepsis, hypotension. It was felt the patient was poor candidate for invasive therapy. Patient was seen by oncologist, who recommended gastro-neurologist consultation, he also discussed with patient. PEG tube placement, however, patient declined it again.   Review of Systems  Constitutional: Negative for chills, fever and weight loss.  HENT: Negative for congestion.   Eyes: Negative for blurred vision and double vision.  Respiratory: Positive for cough, sputum production and shortness of breath. Negative  for wheezing.   Cardiovascular: Negative for chest pain, palpitations, orthopnea, leg swelling and PND.  Gastrointestinal: Positive for abdominal pain, nausea and vomiting. Negative for blood in stool, constipation and diarrhea.  Genitourinary: Negative for dysuria, frequency, hematuria and urgency.  Musculoskeletal: Negative for falls.  Neurological: Negative for dizziness, tremors, focal weakness and headaches.  Endo/Heme/Allergies: Does not bruise/bleed easily.  Psychiatric/Behavioral: Negative for depression. The patient does not have insomnia.     VITAL SIGNS: Blood pressure (!) 98/47, pulse (!) 54, temperature (!) 96.6 F (35.9 C), temperature source Axillary, resp. rate 20, height 5\' 8"  (1.727 m), weight 83.9 kg (185 lb), SpO2 99 %.  PHYSICAL EXAMINATION:   GENERAL:  76 y.o.-year-old patient lying in the bed in mild discomfort due to epigastric pain, nausea, some shortness of breath EYES: Pupils equal, round, reactive to light and accommodation. No scleral icterus. Extraocular muscles intact.  HEENT: Head atraumatic, normocephalic. Oropharynx and nasopharynx clear.  NECK:  Supple, no jugular venous distention. No thyroid enlargement, no tenderness.  LUNGS: Normal breath sounds bilaterally, no wheezing, rales,rhonchi , few scattered right-sided crepitations noted anteriorly. No use of accessory muscles of respiration.  CARDIOVASCULAR: S1, S2 , regularly irregular. No murmurs, rubs, or gallops.  ABDOMEN: Soft, tender in the epigastric area, but no rebound or guarding,, nondistended. Bowel sounds present. No organomegaly or mass.  EXTREMITIES: No pedal edema, cyanosis, or clubbing.  NEUROLOGIC: Cranial nerves II through XII are intact. Muscle strength 5/5 in all extremities. Sensation intact. Gait not checked.  PSYCHIATRIC: The patient is alert and oriented x 3.  SKIN: No obvious rash, lesion, or ulcer.   ORDERS/RESULTS REVIEWED:   CBC  Recent Labs Lab 07/01/16 1549  07/02/16 0519 07/03/16 0333  WBC 6.8 5.0 2.8*  HGB 13.9 11.4* 10.9*  HCT 40.7 32.5* 31.5*  PLT 158 125* 111*  MCV 88.4 88.6 89.3  MCH 30.1 31.0 31.0  MCHC 34.0 35.0 34.7  RDW 19.7* 19.0* 19.1*   ------------------------------------------------------------------------------------------------------------------  Chemistries   Recent Labs Lab 07/01/16 1549 07/02/16 0519 07/03/16 0333  NA 134* 134* 139  K 4.7 3.9 3.3*  CL 99* 104 108  CO2 27 24 28   GLUCOSE 121* 104* 89  BUN 18 16 15   CREATININE 1.10 0.87 0.84  CALCIUM 8.9 7.8* 8.0*   ------------------------------------------------------------------------------------------------------------------ estimated creatinine clearance is 76.5 mL/min (by C-G formula based on SCr of 0.84 mg/dL). ------------------------------------------------------------------------------------------------------------------ No results for input(s): TSH, T4TOTAL, T3FREE, THYROIDAB in the last 72 hours.  Invalid input(s): FREET3  Cardiac Enzymes  Recent Labs Lab 07/01/16 2111 07/02/16 0102 07/02/16 0519  TROPONINI 0.05* 0.04* 0.05*   ------------------------------------------------------------------------------------------------------------------ Invalid input(s): POCBNP ---------------------------------------------------------------------------------------------------------------  RADIOLOGY: Dg Chest 2 View  Result Date: 07/01/2016 CLINICAL DATA:  Shortness of breath and chills EXAM: CHEST  2 VIEW COMPARISON:  06/07/2016 FINDINGS: Right-sided central venous port tip overlies the proximal right atrium. No large pleural effusion. Subtle opacity in the right upper lobe. Stable mild cardiomegaly. No overt failure. No pneumothorax. 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 IMPRESSION: 1. Subtle opacity in the right upper lobe could relate to a mild infiltrate. 2. Stable mild cardiomegaly Electronically Signed   By: Donavan Foil M.D.   On: 07/01/2016 16:20   Ct Angio Chest Pe W And/or Wo Contrast  Result Date: 07/01/2016 CLINICAL DATA:  Shortness of breath and chills. Dyspnea. Esophageal cancer. EXAM: CT ANGIOGRAPHY CHEST WITH CONTRAST TECHNIQUE: Multidetector CT imaging of the chest was performed using the standard protocol during bolus administration of intravenous contrast. Multiplanar CT image reconstructions and MIPs were obtained to evaluate the vascular anatomy. CONTRAST:  75 cc Isovue 370 COMPARISON:  Multiple exams, including 07/01/2016 and 05/29/2015 FINDINGS: Despite efforts by the technologist and patient, motion artifact is present on today's exam and could not be eliminated. This reduces exam sensitivity and specificity. Cardiovascular: No filling defect is identified in the pulmonary arterial tree to suggest pulmonary embolus. Sensitivity in some segments of the pulmonary arterial tree is reduced due to motion artifact. Aortoiliac atherosclerotic vascular disease. Mild cardiomegaly. There is poor contrast opacification in the systemic arterial structures and accordingly sensitivity for some systemic arterial vascular abnormalities is reduced. Mediastinum/Nodes: Scattered small mediastinal lymph nodes. Right hilar node 1.1 cm in short axis on image 44/4, mildly enlarged. Distal esophageal wall thickening with some irregularity, concerning for tumor in this vicinity. Lungs/Pleura: Mild airspace opacity posteriorly in the right upper lobe as on image 36/10, suspicious for pneumonia. Stable pleural calcifications posteriorly along the right upper lobe. There is some tiny pulmonary nodules in the right lung which are stable and probably from old granulomatous disease. Bandlike atelectasis in the posterior basal segment left lower lobe. Upper Abdomen: Unremarkable Musculoskeletal: Old right lower lateral rib fracture, healed. Degenerative glenohumeral arthropathy bilaterally is partially included on today' s exam.  Thoracic spondylosis and mild upper thoracic kyphosis. The thoracic degenerative disc disease at several levels. Review of the MIP images confirms the above findings. IMPRESSION: 1. No filling defect is identified in the pulmonary arterial tree to suggest pulmonary embolus. Mildly reduced sensitivity due to motion artifact. 2. Mild airspace opacity posteriorly in the right upper lobe, suspicious for pneumonia. 3. Indistinct thickened distal esophagus, likely related to patient's known esophageal malignancy. 4. Mild right hilar adenopathy, this may  be reactive. 5. Coronary, aortic arch, and branch vessel atherosclerotic vascular disease. 6. Mild cardiomegaly. 7. Mild atelectasis in the left lower lobe. Electronically Signed   By: Van Clines M.D.   On: 07/01/2016 17:08    EKG:  Orders placed or performed during the hospital encounter of 07/01/16  . ED EKG within 10 minutes  . ED EKG within 10 minutes    ASSESSMENT AND PLAN:  Active Problems:   Pneumonia  #1. Right upper lobe healthcare associated pneumonia, concerning for aspiration pneumonitis, continue patient on Zosyn, sputum culture is pending, MRSA PCR was negative,  continue oxygen therapy, nebulizers, clinically improved  #2. Atrial fibrillation, rate controlled. Continue, Coreg, Coumadin therapy, . The patient was seen by cardiologist, who recommended to discontinue digoxin. #3. Epigastric abdominal pain, patient had EGD in November 2017, revealing friable esophagus, he has known distal esophageal cancer ,  Continue carafate, Protonix,  full liquid diet, get gastroenterologist involved recommendations, getting barium swallowing study, patient refused PEG tube placement again.  #4. Hyponatremia,  improved with IV fluids, follow sodium level intermittently #5 elevated troponin, echocardiogram in September 2017 revealed ejection fraction of 35%, cardiologist saw the patient in consultation, recommended to continue  Coreg,  unable to use  ACE inhibitor due to hypotension #6. Lactic acidosis, resolved #7. Anemia, no bleeding noted, follow hemoglobin level in the morning, patient is on Coumadin therapy, following INR closely, supratherapeutic still #8 failure to thrive, adult, continue  full liquid diet, Carafate,  gastroenterologist to see patient in consultation, as patient has been having problems with odynophagia. Patient has known esophageal cancer, which could be obstructive, , awaiting for barium swallowing study done on  Monday #9 odynophagia, likely due to esophageal cancer, supportive therapy was Carafate, Protonix, insulin. Consultation is requested for further recommendations, barium swallow study to be done tomorrow as above  Management plans discussed with the patient, family and they are in agreement.   DRUG ALLERGIES: No Known Allergies  CODE STATUS:     Code Status Orders        Start     Ordered   07/01/16 2054  Full code  Continuous     07/01/16 2053    Code Status History    Date Active Date Inactive Code Status Order ID Comments User Context   06/07/2016  6:02 PM 06/09/2016  6:46 PM Full Code [id number]  Dustin Flock, MD Inpatient   01/17/2016 10:54 AM 01/19/2016  8:02 PM Full Code [id number]  Earnestine Leys, MD Inpatient   01/15/2016  3:36 PM 01/15/2016 10:25 PM Full Code [id number]  Demetrios Loll, MD Inpatient   01/26/2015  6:21 PM 02/02/2015  6:18 PM Full Code [id number]  Vaughan Basta, MD ED      TOTAL TIME TAKING CARE OF THIS PATIENT: 40 minutes.    Theodoro Grist M.D on 07/03/2016 at 1:32 PM  Between 7am to 6pm - Pager - [phone]  After 6pm go to [email or web address] - password [email or web address] [facility]  Office  [phone]  CC: Primary care physician; [name], MD

## 2016-07-03 NOTE — Progress Notes (Signed)
Physical Therapy Evaluation Patient Details Name: Jeremy Gordon MRN: [id number] DOB: [birth year] Today's Date: 07/03/2016   History of Present Illness  Pt. is a 76 year old male admitted to [facility] with shortness of breath, rigor and chills. He reports that he was just in a SNF for 2 weeks and had just been home for a day prior to coming back to the hospital.   Clinical Impression  The patient presents with decreased LE strength, mobility and gait. He needed increased assistance for bed mobility tasks, but was able to transfer to the bedside commode with minA and cues for safety. Patient had just come from a SNF and did report that he was doing minimal walking due to his legs getting weak. Patient may benefit from formal PT to address strength, mobility and gait.     Follow Up Recommendations SNF    Equipment Recommendations  Rolling walker with 5" wheels;3in1 (PT)    Recommendations for Other Services       Precautions / Restrictions Precautions Precautions: Fall Restrictions Weight Bearing Restrictions: No      Mobility  Bed Mobility Overal bed mobility: Needs Assistance Bed Mobility: Supine to Sit     Supine to sit: Max assist     General bed mobility comments: Needs assistance to move legs off bed, reports that they are sore; able to help some with UE's but does lack strength to push up   Transfers Overall transfer level: Modified independent Equipment used: Rolling walker (2 wheeled)             General transfer comment: Pt. needs cueing to push from bed, wanted to transfer without the walker  Ambulation/Gait Ambulation/Gait assistance: Min assist Ambulation Distance (Feet): 3 Feet Assistive device: Rolling walker (2 wheeled) Gait Pattern/deviations: Trunk flexed     General Gait Details: Needs cueing for safety prior to sitting  Stairs            Wheelchair Mobility    Modified Rankin (Stroke Patients Only)       Balance Overall balance  assessment: Needs assistance Sitting-balance support: Bilateral upper extremity supported;Single extremity supported   Sitting balance - Comments: tends to lean posteriorly Postural control: Posterior lean Standing balance support: Bilateral upper extremity supported   Standing balance comment: assistance needed for safety                             Pertinent Vitals/Pain Pain Assessment: No/denies pain (other than where the IV is in his arm)    Home Living Family/patient expects to be discharged to:: Private residence Living Arrangements: Spouse/significant other Available Help at Discharge: Family (his wife has just had surgery, so she would not home) Type of Home: House Home Access: Stairs to enter Entrance Stairs-Rails: Psychiatric nurse of Steps: 5 Home Layout: Two level;Able to live on main level with bedroom/bathroom Home Equipment: Gilford Rile - 2 wheels;Wheelchair - manual      Prior Function Level of Independence: Needs assistance   Gait / Transfers Assistance Needed: Pt. reports that he was not able to walk much in the SNF and would have to use a wheelchair; he reports that his legs are weak.            Hand Dominance        Extremity/Trunk Assessment   Upper Extremity Assessment Upper Extremity Assessment: RUE deficits/detail RUE Deficits / Details: Decreased R shoulder elevation to ~90 degrees LUE Deficits / Details:  L shoulder flexion to ~120 degrees    Lower Extremity Assessment Lower Extremity Assessment: RLE deficits/detail;LLE deficits/detail RLE Deficits / Details: knee flexion strength 3+/5; knee extension strength 4/5 LLE Deficits / Details: knee flexion strength 3+/5; knee extension strength 4/5       Communication   Communication: HOH  Cognition Arousal/Alertness: Awake/alert Behavior During Therapy: WFL for tasks assessed/performed Overall Cognitive Status: Within Functional Limits for tasks assessed                       General Comments      Exercises     Assessment/Plan    PT Assessment Patient needs continued PT services  PT Problem List Decreased strength;Decreased mobility;Decreased safety awareness       PT Treatment Interventions DME instruction;Gait training;Stair training;Therapeutic activities;Therapeutic exercise;Neuromuscular re-education;Patient/family education    PT Goals (Current goals can be found in the Care Plan section)  Acute Rehab PT Goals Patient Stated Goal: Go home PT Goal Formulation: With patient Time For Goal Achievement: 07/17/16 Potential to Achieve Goals: Fair    Frequency Min 2X/week   Barriers to discharge        Co-evaluation               End of Session Equipment Utilized During Treatment: Gait belt;Oxygen (2L) Activity Tolerance: Other (comment) (Pt. needed to use the restroom once he was in sitting. ) Patient left: with nursing/sitter in room (on bedside commode)   PT Visit Diagnosis: Unsteadiness on feet (R26.81);Other abnormalities of gait and mobility (R26.89);Muscle weakness (generalized) (M62.81)    Functional Assessment Tool Used: AM-PAC 6 Clicks Basic Mobility;Clinical judgement Functional Limitation: Mobility: Walking and moving around Mobility: Walking and Moving Around Current Status (K2409): At least 60 percent but less than 80 percent impaired, limited or restricted Mobility: Walking and Moving Around Goal Status [phone]): At least 40 percent but less than 60 percent impaired, limited or restricted    Time: 1110-1135 PT Time Calculation (min) (ACUTE ONLY): 25 min   Charges:   PT Evaluation $PT Eval Low Complexity: 1 Procedure     PT G Codes:   PT G-Codes **NOT FOR INPATIENT CLASS** Functional Assessment Tool Used: AM-PAC 6 Clicks Basic Mobility;Clinical judgement Functional Limitation: Mobility: Walking and moving around Mobility: Walking and Moving Around Current Status (D9242): At least 60 percent but less  than 80 percent impaired, limited or restricted Mobility: Walking and Moving Around Goal Status [phone]): At least 40 percent but less than 60 percent impaired, limited or restricted     Bertram Denver, PT, DPT, CWCE 07/03/2016, 2:42 PM

## 2016-07-03 NOTE — Consult Note (Signed)
Cardiology Consultation   Patient ID: Jeremy Gordon; [id number]; May 03, 1940   Admit date: 07/01/2016 Date of Consult: 07/03/2016  Referring MD:  Dr. Ether Griffins Patient Care Team: [name], MD as PCP - General (Geriatric Medicine) No Pcp Per Patient ([place]) Manya Silvas, MD (Gastroenterology)    Reason for Consultation: troponin elevation, EF of 35% from 12/2015   History of Present Illness: Jeremy Gordon is a 76 y.o. male with a hx of Esophageal cancer, COPD, atrial fibrillation, GERD, hypertension who presents to the hospital due to shortness of breath, rigors and chills. Pt was hypotensive in the ER. Lactate level was elevated. Pt has been assessed to have pneumonia (HAP) and was admitted for treatment of this.   Consult is for finding of troponin of 0.04, 0.05. Also, echo from 01/15/2016 revealed depressed EF.  Patient reports that his main problem is abdominal pain.He denies CP, palpitations. Limited functional capacity at baseline, uses walker to get around but no SOB or CP with exertion. Patient aware of atrial fibrillation diagnosis but barely recalls being told in the past that heart is weak. He does not recall stress test or cath in past.  It appears that patient IS NOT an established outpatient of our cardiology practice. He was last seen by Dr. Humphrey Rolls 01/19/2016 while patient was in the hospital. He was seen for preoperative evaluation for right hip fracture surgery. At that time, his EF was noted to be 35%. There is no available cardiac workup for review on epic.  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 Patient completed 4 cycles of chemotherapy. Patient is not a surgical candidate. Due to his declining performance status, it was agreed that no further treatments could be tolerated. Hospice was recommended."   ROS:  Please see the history of present illness.  ROS All other ROS reviewed and  negative.    Past Medical History:  Diagnosis Date  . Arthritis   . Atrial fibrillation ([place])   . COPD (chronic obstructive pulmonary disease) ([place])   . Dysrhythmia   . Esophageal cancer ([place])   . Esophageal carcinoma ([place])    a. Chemo: FOLFIRI  . Essential hypertension   . GERD (gastroesophageal reflux disease)   . History of stress test    a. 06/2003 MV: EF 56%, no ischemia/infarct.  . Persistent atrial fibrillation ([place])    a. 07/2013 Echo: EF 60-65%, mod dil LA, mild TR;  CHA2DS2VASc = 3-->chronic coumadin.  . Sleep apnea     Past Surgical History:  Procedure Laterality Date  . ESOPHAGOGASTRODUODENOSCOPY (EGD) WITH PROPOFOL N/A 08/31/2015   Procedure: ESOPHAGOGASTRODUODENOSCOPY (EGD) WITH PROPOFOL;  Surgeon: Manya Silvas, MD;  Location: [facility] ENDOSCOPY;  Service: Endoscopy;  Laterality: N/A;  . ESOPHAGOGASTRODUODENOSCOPY (EGD) WITH PROPOFOL N/A 02/26/2016   Procedure: ESOPHAGOGASTRODUODENOSCOPY (EGD) WITH PROPOFOL;  Surgeon: Manya Silvas, MD;  Location: [facility] ENDOSCOPY;  Service: Endoscopy;  Laterality: N/A;  . FEMUR IM NAIL Right 01/17/2016   Procedure: INTRAMEDULLARY (IM) NAIL FEMORAL;  Surgeon: Earnestine Leys, MD;  Location: [facility] ORS;  Service: Orthopedics;  Laterality: Right;  . HERNIA REPAIR    . PERIPHERAL VASCULAR CATHETERIZATION N/A 11/20/2014   Procedure: Glori Luis Cath Insertion;  Surgeon: Algernon Huxley, MD;  Location: [facility] LAB;  Service: Cardiovascular;  Laterality: N/A;      Home Meds: Prior to Admission medications   Medication Sig Start Date End Date Taking? Authorizing Provider  albuterol (PROVENTIL) (2.5 MG/3ML) 0.083%  nebulizer solution Inhale 3 mLs into the lungs every 6 (six) hours. 06/09/16  Yes Loletha Grayer, MD  diltiazem (CARDIZEM CD) 180 MG 24 hr capsule Take 1 capsule (180 mg total) by mouth daily. Patient taking differently: Take 120 mg by mouth daily.  06/09/16  Yes Richard Leslye Peer, MD  enalapril (VASOTEC) 20 MG tablet Take 20 mg by mouth  daily.    Yes Historical Provider, MD  HYDROcodone-acetaminophen (NORCO) 10-325 MG tablet Take 1 tablet by mouth every 8 (eight) hours as needed.   Yes Historical Provider, MD  ondansetron (ZOFRAN) 4 MG tablet Take 1 tablet (4 mg total) by mouth every 8 (eight) hours as needed for nausea or vomiting. 05/06/16  Yes Lisa Roca, MD  prochlorperazine (COMPAZINE) 10 MG tablet Take 1 tablet (10 mg total) by mouth every 6 (six) hours as needed for nausea or vomiting. 11/13/15  Yes Lloyd Huger, MD  promethazine (PHENERGAN) 25 MG suppository Place 1 suppository (25 mg total) rectally every 6 (six) hours as needed for nausea or vomiting. 05/23/16  Yes Lloyd Huger, MD  senna (SENOKOT) 8.6 MG TABS tablet Take 1 tablet (8.6 mg total) by mouth 2 (two) times daily. 01/19/16  Yes Demetrios Loll, MD  warfarin (COUMADIN) 4 MG tablet Take 2 tablets by mouth with evening meal 07/27/15  Yes Historical Provider, MD  digoxin (LANOXIN) 0.125 MG tablet Take 0.125 mg by mouth daily.  02/02/15   Historical Provider, MD  doxycycline (VIBRA-TABS) 100 MG tablet Take 1 tablet (100 mg total) by mouth 2 (two) times daily. Patient not taking: Reported on 07/01/2016 06/09/16   Loletha Grayer, MD  finasteride (PROSCAR) 5 MG tablet Take 5 mg by mouth daily.    Historical Provider, MD  fluticasone (FLONASE) 50 MCG/ACT nasal spray ONE SPRAY IN EACH NOSTRIL AT NIGHT AS DIRECTED 07/01/15   Historical Provider, MD  fluticasone (FLOVENT HFA) 220 MCG/ACT inhaler Inhale into the lungs. 08/28/15 08/27/16  Historical Provider, MD  gabapentin (NEURONTIN) 300 MG capsule Take 300 mg by mouth 2 (two) times daily.  07/07/15   Historical Provider, MD  metoprolol (LOPRESSOR) 50 MG tablet Take 50 mg by mouth 2 (two) times daily.  07/30/15 07/29/16  Historical Provider, MD  morphine (ROXANOL) 20 MG/ML concentrated solution Take 0.5 mLs (10 mg total) by mouth every 6 (six) hours as needed for severe pain. 06/09/16   Loletha Grayer, MD  omeprazole (PRILOSEC)  20 MG capsule Take 1 capsule (20 mg total) by mouth daily. Patient taking differently: Take 20 mg by mouth 2 (two) times daily before a meal.  12/03/15   Lloyd Huger, MD  predniSONE (DELTASONE) 5 MG tablet 5 tabs po day1; 4 tabs po day2; 3 tabs po day3; 2 tabs po day4; 1 tab po day5 Patient not taking: Reported on 07/01/2016 06/09/16   Loletha Grayer, MD  tamsulosin (FLOMAX) 0.4 MG CAPS capsule Take 0.4 mg by mouth daily. 08/25/15   Historical Provider, MD    Current Medications: . albuterol  3 mL Inhalation TID  . budesonide (PULMICORT) nebulizer solution  0.25 mg Nebulization BID  . carvedilol  3.125 mg Oral BID WC  . digoxin  0.125 mg Oral Daily  . finasteride  5 mg Oral Daily  . gabapentin  300 mg Oral BID  . pantoprazole  40 mg Oral Daily  . piperacillin-tazobactam (ZOSYN)  IV  3.375 g Intravenous Q8H  . polyethylene glycol  17 g Oral Daily  . senna  1 tablet Oral BID  .  sodium chloride flush  3 mL Intravenous Q12H  . sucralfate  1 g Oral Q6H  . tamsulosin  0.4 mg Oral Daily  . warfarin  3.5 mg Oral ONCE-1800  . Warfarin - Pharmacist Dosing Inpatient   Does not apply q1800     Allergies:   No Known Allergies  Social History:   The patient  reports that he has been smoking.  He has smoked for the past 62.00 years. His smokeless tobacco use includes Snuff. He reports that he does not drink alcohol or use drugs.    Family History:   The patient's family history includes Cancer in his mother; Hypertension in his brother and sister; Stroke in his father.       PHYSICAL EXAM: VS:  BP 121/85 (BP Location: Right Arm)   Pulse 88   Temp 98.2 F (36.8 C) (Oral)   Resp 18   Ht 5\' 8"  (1.727 m)   Wt 185 lb (83.9 kg)   SpO2 99%   BMI 28.13 kg/m  , BMI Body mass index is 28.13 kg/m. GENERAL:  well developed, well nourished, not in acute distress HEENT: normocephalic, pink conjunctivae, anicteric sclerae, no xanthelasma, normal dentition, oropharynx clear NECK:  no neck vein  engorgement, JVP normal, no hepatojugular reflux, carotid upstroke brisk and symmetric, no bruit, no thyromegaly, no lymphadenopathy LUNGS:  good respiratory effort, clear to auscultation bilaterally CV:  PMI not displaced, no thrills, no lifts, irregular, S2 within normal limits, no palpable S3 or S4, no murmurs, no rubs, no gallops ABD:  Soft, nontender, nondistended, normoactive bowel sounds, no abdominal aortic bruit, no hepatomegaly, no splenomegaly MS: nontender back, no kyphosis, no scoliosis, no joint deformities EXT:  2+ DP/PT pulses, no edema, no varicosities, no cyanosis, no clubbing SKIN: warm, nondiaphoretic, normal turgor, no ulcers NEUROPSYCH: alert, oriented to person, place, and time, sensory/motor grossly intact, normal mood, flat affect    EKG:  From 07/01/2016 was personally reviewed by me and it showed Afib, VR 89bpm. Incomplete RBBB, LAFB, can not rule outanterior infarct. No significant change from 05/06/2016.  Labs:  Recent Labs  07/01/16 1549 07/01/16 2111 07/02/16 0102 07/02/16 0519  TROPONINI 0.04* 0.05* 0.04* 0.05*   Lab Results  Component Value Date   WBC 2.8 (L) 07/03/2016   HGB 10.9 (L) 07/03/2016   HCT 31.5 (L) 07/03/2016   MCV 89.3 07/03/2016   PLT 111 (L) 07/03/2016    Recent Labs Lab 07/03/16 0333  NA 139  K 3.3*  CL 108  CO2 28  BUN 15  CREATININE 0.84  CALCIUM 8.0*  GLUCOSE 89   No results found for: CHOL, HDL, LDLCALC, TRIG No results found for: DDIMER  Radiology/Studies:  Dg Chest 2 View  Result Date: 07/01/2016 CLINICAL DATA:  Shortness of breath and chills EXAM: CHEST  2 VIEW COMPARISON:  06/07/2016 FINDINGS: Right-sided central venous port tip overlies the proximal right atrium. No large pleural effusion. Subtle opacity in the right upper lobe. Stable mild cardiomegaly. No overt failure. No pneumothorax. 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 IMPRESSION: 1. Subtle opacity in the right upper  lobe could relate to a mild infiltrate. 2. Stable mild cardiomegaly Electronically Signed   By: Donavan Foil M.D.   On: 07/01/2016 16:20   Dg Chest 2 View  Result Date: 06/07/2016 CLINICAL DATA:  76 year old male with shortness breath for 4 days. Symptoms started after radiation therapy treatment for esophageal cancer. Initial encounter. EXAM: CHEST  2 VIEW COMPARISON:  01/15/2016 01/26/2015 chest x-ray. FINDINGS: Mild progression of parenchymal changes left lung base. Question post therapy changes versus infiltrate. Central pulmonary vascular prominence stable. Right central line tip distal superior vena cava. No plain film evidence of pulmonary metastatic disease. Cardiomegaly. Calcified aorta. Shoulder joint degenerative changes greater on the right. IMPRESSION: Mild progression of parenchymal changes left lung base. Question post therapy changes versus infiltrate. Electronically Signed   By: Genia Del M.D.   On: 06/07/2016 14:43   Dg Knee 1-2 Views Right  Result Date: 06/09/2016 CLINICAL DATA:  Pain following fall EXAM: RIGHT KNEE - 1-2 VIEW COMPARISON:  January 15, 2016 FINDINGS: Frontal and lateral views were obtained it. There is no fracture or dislocation. No appreciable joint effusion. There is moderately severe joint space narrowing medially with moderate narrowing laterally and in the patellofemoral joint region. There is spurring in all compartments. No erosive change. IMPRESSION: Osteoarthritic change, most marked medially. No acute fracture or joint effusion. Electronically Signed   By: Lowella Grip III M.D.   On: 06/09/2016 09:22   Ct Angio Chest Pe W And/or Wo Contrast  Result Date: 07/01/2016 CLINICAL DATA:  Shortness of breath and chills. Dyspnea. Esophageal cancer. EXAM: CT ANGIOGRAPHY CHEST WITH CONTRAST TECHNIQUE: Multidetector CT imaging of the chest was performed using the standard protocol during bolus administration of intravenous contrast. Multiplanar CT image  reconstructions and MIPs were obtained to evaluate the vascular anatomy. CONTRAST:  75 cc Isovue 370 COMPARISON:  Multiple exams, including 07/01/2016 and 05/29/2015 FINDINGS: Despite efforts by the technologist and patient, motion artifact is present on today's exam and could not be eliminated. This reduces exam sensitivity and specificity. Cardiovascular: No filling defect is identified in the pulmonary arterial tree to suggest pulmonary embolus. Sensitivity in some segments of the pulmonary arterial tree is reduced due to motion artifact. Aortoiliac atherosclerotic vascular disease. Mild cardiomegaly. There is poor contrast opacification in the systemic arterial structures and accordingly sensitivity for some systemic arterial vascular abnormalities is reduced. Mediastinum/Nodes: Scattered small mediastinal lymph nodes. Right hilar node 1.1 cm in short axis on image 44/4, mildly enlarged. Distal esophageal wall thickening with some irregularity, concerning for tumor in this vicinity. Lungs/Pleura: Mild airspace opacity posteriorly in the right upper lobe as on image 36/10, suspicious for pneumonia. Stable pleural calcifications posteriorly along the right upper lobe. There is some tiny pulmonary nodules in the right lung which are stable and probably from old granulomatous disease. Bandlike atelectasis in the posterior basal segment left lower lobe. Upper Abdomen: Unremarkable Musculoskeletal: Old right lower lateral rib fracture, healed. Degenerative glenohumeral arthropathy bilaterally is partially included on today' s exam. Thoracic spondylosis and mild upper thoracic kyphosis. The thoracic degenerative disc disease at several levels. Review of the MIP images confirms the above findings. IMPRESSION: 1. No filling defect is identified in the pulmonary arterial tree to suggest pulmonary embolus. Mildly reduced sensitivity due to motion artifact. 2. Mild airspace opacity posteriorly in the right upper lobe,  suspicious for pneumonia. 3. Indistinct thickened distal esophagus, likely related to patient's known esophageal malignancy. 4. Mild right hilar adenopathy, this may be reactive. 5. Coronary, aortic arch, and branch vessel atherosclerotic vascular disease. 6. Mild cardiomegaly. 7. Mild atelectasis in the left lower lobe. Electronically Signed   By: Van Clines M.D.   On: 07/01/2016 17:08   Dg Femur Min 2 Views Left  Result Date: 06/09/2016 CLINICAL DATA:  Pain following fall EXAM: LEFT FEMUR 2 VIEWS COMPARISON:  None. FINDINGS: Frontal and lateral views were obtained.  There is no acute fracture or dislocation. There is moderate osteoarthritic change in the left hip joint. There is no knee joint effusion. There is atherosclerotic calcification in the superficial femoral artery. IMPRESSION: Osteoarthritic change left hip joint. No acute fracture or dislocation. Superficial femoral artery atherosclerosis. Electronically Signed   By: Lowella Grip III M.D.   On: 06/09/2016 09:21   Echo 01/15/2016, Dr. Humphrey Rolls: Left ventricle: The cavity size was moderately dilated. Systolic   function was moderately to severely reduced. The estimated   ejection fraction was 35%. Diffuse hypokinesis. The study is not   technically sufficient to allow evaluation of LV diastolic   function. - Aortic valve: There was trivial regurgitation. - Mitral valve: Calcified annulus. There was moderate   regurgitation.  Impressions:  - There is severe four-chamber dilatation with severe LV   dysfunction left ventricular ejection fraction 32%. There is mild   to moderate mitral regurgitation and mild tricuspid regurgitation   with normal pulmonary pressures. This no evidence of any thrombi   and cardiac chambers.  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 Minimal elevation of troponin. EKG shows no acute changes from before. No evidence of volume overload on physical  exam. Troponin elevation is likely demand ischemia from PNA, sepsis, episode of hypotension. It appears that pt has not had prior work up for the cardiomyopathy. With current status of esophageal CA, pt is a poor candidate for any invasive treatment. There is mention of consideration for hospice from last outpt oncology note 06/2016. Patient verbalized understanding.  Rec medical therapy: Cont Coreg. Resume Lisinopril once BP tolerates. Does not seem to need a diuretic at this point.  Rec reevaluation with echo. Further recommendations once EF reassessed.  Replete electrolytes.  Permanent atrial fibrillation, rate controlled Agree with rate control with beta blocker.  Consider discontinuing digoxin as risks likely outweigh benefits. Pt on coumadin. INR goal 2-3. May consider newer anticoagulant (DOAC) - Eliquis or Xarelto -- if cost not prohibitive and if ok with oncology.   Total encounter time 70 minutes with the patient today and more than 50% of the time was spent counseling the patient and coordinating care.    Signed, Wende Bushy, MD  07/03/2016 11:32 AM  [place]

## 2016-07-03 NOTE — Consult Note (Signed)
[facility] CONSULT NOTE  Patient Care Team: [name], MD as PCP - General (Geriatric Medicine) No Pcp Per Patient (General Practice) Manya Silvas, MD (Gastroenterology)  CHIEF COMPLAINTS/PURPOSE OF CONSULTATION: Recurrent esophageal cancer/ failure to thrive  HISTORY OF PRESENTING ILLNESS:  Jeremy Gordon 76 y.o.  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 Grayland Ormond- most recently underwent chemotherapy radiation- with no significant improvement/recurrence noted noted on a PET scan in fall of 2017. Most recently he had radiation for palliative reasons to the esophagus. He is currently on surveillance given his declining performance status.  Patient is currently admitted to the hospital for worsening difficulty swallowing. Positive for weight loss. Admits to difficulty swallowing liquids and solids. Positive for regurgitation. He denies any blood in stools or black stools. He feels weak. Patient denies any fevers or chills. Denies any cough.  Course in the hospital- CT scan shows possible right upper lobe pneumonia. Question aspiration. Oncology has been consult for follow-up recommendations.  ROS: A complete 10 point review of system is done which is negative except mentioned above in history of present illness  MEDICAL HISTORY:  Past Medical History:  Diagnosis Date  . Arthritis   . Atrial fibrillation ([place])   . COPD (chronic obstructive pulmonary disease) ([place])   . Dysrhythmia   . Esophageal cancer ([place])   . Esophageal carcinoma ([place])    a. Chemo: FOLFIRI  . Essential hypertension   . GERD (gastroesophageal reflux disease)   . History of stress test    a. 06/2003 MV: EF 56%, no ischemia/infarct.  . Persistent atrial fibrillation ([place])    a. 07/2013 Echo: EF 60-65%, mod dil LA, mild TR;  CHA2DS2VASc = 3-->chronic coumadin.  . Sleep apnea     SURGICAL HISTORY: Past Surgical History:  Procedure  Laterality Date  . ESOPHAGOGASTRODUODENOSCOPY (EGD) WITH PROPOFOL N/A 08/31/2015   Procedure: ESOPHAGOGASTRODUODENOSCOPY (EGD) WITH PROPOFOL;  Surgeon: Manya Silvas, MD;  Location: [facility] ENDOSCOPY;  Service: Endoscopy;  Laterality: N/A;  . ESOPHAGOGASTRODUODENOSCOPY (EGD) WITH PROPOFOL N/A 02/26/2016   Procedure: ESOPHAGOGASTRODUODENOSCOPY (EGD) WITH PROPOFOL;  Surgeon: Manya Silvas, MD;  Location: [facility] ENDOSCOPY;  Service: Endoscopy;  Laterality: N/A;  . FEMUR IM NAIL Right 01/17/2016   Procedure: INTRAMEDULLARY (IM) NAIL FEMORAL;  Surgeon: Earnestine Leys, MD;  Location: [facility] ORS;  Service: Orthopedics;  Laterality: Right;  . HERNIA REPAIR    . PERIPHERAL VASCULAR CATHETERIZATION N/A 11/20/2014   Procedure: Glori Luis Cath Insertion;  Surgeon: Algernon Huxley, MD;  Location: [facility] CV LAB;  Service: Cardiovascular;  Laterality: N/A;    SOCIAL HISTORY: Social History   Social History  . Marital status: Married    Spouse name: N/A  . Number of children: N/A  . Years of education: N/A   Occupational History  . Not on file.   Social History Main Topics  . Smoking status: Current Some Day Smoker    Years: 62.00  . Smokeless tobacco: Current User    Types: Snuff  . Alcohol use No  . Drug use: No  . Sexual activity: Not on file   Other Topics Concern  . Not on file   Social History Narrative  . No narrative on file    FAMILY HISTORY: Family History  Problem Relation Age of Onset  . Cancer Mother   . Stroke Father   . Hypertension Sister   . Hypertension Brother     ALLERGIES:  has No Known Allergies.  MEDICATIONS:  Current Facility-Administered Medications  Medication Dose Route Frequency Provider Last Rate Last Dose  . 0.9 % NaCl with KCl 20 mEq/ L  infusion   Intravenous Continuous Theodoro Grist, MD 75 mL/hr at 07/03/16 0924    . acetaminophen (TYLENOL) tablet 650 mg  650 mg Oral Q6H PRN Henreitta Leber, MD       Or  . acetaminophen (TYLENOL) suppository 650 mg   650 mg Rectal Q6H PRN Henreitta Leber, MD      . albuterol (PROVENTIL) (2.5 MG/3ML) 0.083% nebulizer solution 2.5 mg  2.5 mg Nebulization Q6H PRN Henreitta Leber, MD      . albuterol (PROVENTIL) (2.5 MG/3ML) 0.083% nebulizer solution 3 mL  3 mL Inhalation TID Henreitta Leber, MD   3 mL at 07/03/16 0735  . budesonide (PULMICORT) nebulizer solution 0.25 mg  0.25 mg Nebulization BID Henreitta Leber, MD   0.25 mg at 07/03/16 0735  . carvedilol (COREG) tablet 3.125 mg  3.125 mg Oral BID WC Theodoro Grist, MD   3.125 mg at 07/03/16 0902  . digoxin (LANOXIN) tablet 0.125 mg  0.125 mg Oral Daily Henreitta Leber, MD   0.125 mg at 07/03/16 0902  . finasteride (PROSCAR) tablet 5 mg  5 mg Oral Daily Henreitta Leber, MD   5 mg at 07/03/16 0902  . gabapentin (NEURONTIN) capsule 300 mg  300 mg Oral BID Henreitta Leber, MD   300 mg at 07/03/16 0902  . morphine CONCENTRATE 10 MG/0.5ML oral solution 10 mg  10 mg Oral Q6H PRN Henreitta Leber, MD      . ondansetron [facility]) tablet 4 mg  4 mg Oral Q6H PRN Henreitta Leber, MD       Or  . ondansetron (ZOFRAN) injection 4 mg  4 mg Intravenous Q6H PRN Henreitta Leber, MD   4 mg at 07/02/16 0923  . oxyCODONE (ROXICODONE INTENSOL) 20 MG/ML concentrated solution 10 mg  10 mg Oral Q4H PRN Theodoro Grist, MD      . pantoprazole (PROTONIX) EC tablet 40 mg  40 mg Oral Daily Henreitta Leber, MD   40 mg at 07/03/16 0902  . piperacillin-tazobactam (ZOSYN) IVPB 3.375 g  3.375 g Intravenous Q8H Sheema M Hallaji, RPH   3.375 g at 07/03/16 0609  . polyethylene glycol (MIRALAX / GLYCOLAX) packet 17 g  17 g Oral Daily Theodoro Grist, MD   17 g at 07/03/16 0924  . senna (SENOKOT) tablet 8.6 mg  1 tablet Oral BID Henreitta Leber, MD   8.6 mg at 07/03/16 0902  . sodium chloride flush (NS) 0.9 % injection 3 mL  3 mL Intravenous Q12H Henreitta Leber, MD      . sucralfate (CARAFATE) 1 GM/10ML suspension 1 g  1 g Oral Q6H Theodoro Grist, MD   1 g at 07/03/16 0901  . tamsulosin (FLOMAX) capsule 0.4  mg  0.4 mg Oral Daily Henreitta Leber, MD   0.4 mg at 07/03/16 0902  . warfarin (COUMADIN) tablet 3.5 mg  3.5 mg Oral ONCE-1800 Henreitta Leber, MD      . Warfarin - Pharmacist Dosing Inpatient   Does not apply q1800 Sheema M Hallaji, RPH       Facility-Administered Medications Ordered in Other Encounters  Medication Dose Route Frequency Provider Last Rate Last Dose  . sodium chloride 0.9 % injection 10 mL  10 mL Intravenous PRN Lloyd Huger, MD   10 mL at  12/23/14 0945  . sodium chloride 0.9 % injection 10 mL  10 mL Intravenous PRN Lloyd Huger, MD   10 mL at 01/15/15 1512  . sodium chloride flush (NS) 0.9 % injection 10 mL  10 mL Intravenous PRN Lloyd Huger, MD   10 mL at 11/10/15 0939      .  PHYSICAL EXAMINATION:  Vitals:   07/03/16 0748 07/03/16 1137  BP: 121/85 (!) 98/47  Pulse: 88 (!) 54  Resp: 18 20  Temp: 98.2 F (36.8 C) (!) 96.6 F (35.9 C)   Filed Weights   07/01/16 1523  Weight: 185 lb (83.9 kg)    GENERAL: Well-nourished well-developed; Alert, no distress and comfortable.   Alone. EYES: no pallor or icterus OROPHARYNX: no thrush or ulceration. Poor dentition.  NECK: supple, no masses felt LYMPH:  no palpable lymphadenopathy in the cervical, axillary or inguinal regions LUNGS: decreased breath sounds to auscultation at bases and  No wheeze or crackles HEART/CVS: regular rate & rhythm and no murmurs; No lower extremity edema ABDOMEN: abdomen soft, non-tender and normal bowel sounds Musculoskeletal:no cyanosis of digits and no clubbing  PSYCH: alert & oriented x 3 with fluent speech NEURO: no focal motor/sensory deficits SKIN:  no rashes or significant lesions  LABORATORY DATA:  I have reviewed the data as listed Lab Results  Component Value Date   WBC 2.8 (L) 07/03/2016   HGB 10.9 (L) 07/03/2016   HCT 31.5 (L) 07/03/2016   MCV 89.3 07/03/2016   PLT 111 (L) 07/03/2016    Recent Labs  05/31/16 1057 06/07/16 1256  06/21/16 0833  07/01/16 1549 07/02/16 0519 07/03/16 0333  NA 135 133*  < > 135 134* 134* 139  K 3.4* 3.3*  < > 4.1 4.7 3.9 3.3*  CL 103 99*  < > 102 99* 104 108  CO2 27 28  < > 25 27 24 28   GLUCOSE 122* 91  < > 115* 121* 104* 89  BUN 12 9  < > 16 18 16 15   CREATININE 0.74 0.61  < > 0.76 1.10 0.87 0.84  CALCIUM 8.0* 8.0*  < > 8.4* 8.9 7.8* 8.0*  GFRNONAA >60 >60  < > >60 >60 >60 >60  GFRAA >60 >60  < > >60 >60 >60 >60  PROT 5.6* 5.4*  --  5.7*  --   --   --   ALBUMIN 2.6* 2.7*  --  2.7*  --   --   --   AST 21 20  --  27  --   --   --   ALT 11* 13*  --  14*  --   --   --   ALKPHOS 68 72  --  91  --   --   --   BILITOT 0.8 1.1  --  0.6  --   --   --   < > = values in this interval not displayed.  RADIOGRAPHIC STUDIES: I have personally reviewed the radiological images as listed and agreed with the findings in the report. Dg Chest 2 View  Result Date: 07/01/2016 CLINICAL DATA:  Shortness of breath and chills EXAM: CHEST  2 VIEW COMPARISON:  06/07/2016 FINDINGS: Right-sided central venous port tip overlies the proximal right atrium. No large pleural effusion. Subtle opacity in the right upper lobe. Stable mild cardiomegaly. No overt failure. No pneumothorax. 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 IMPRESSION: 1. Subtle opacity in the right upper lobe could relate  to a mild infiltrate. 2. Stable mild cardiomegaly Electronically Signed   By: Donavan Foil M.D.   On: 07/01/2016 16:20   Dg Chest 2 View  Result Date: 06/07/2016 CLINICAL DATA:  76 year old male with shortness breath for 4 days. Symptoms started after radiation therapy treatment for esophageal cancer. Initial encounter. EXAM: CHEST  2 VIEW COMPARISON:  01/15/2016 01/26/2015 chest x-ray. FINDINGS: Mild progression of parenchymal changes left lung base. Question post therapy changes versus infiltrate. Central pulmonary vascular prominence stable. Right central line tip distal superior vena cava. No plain film  evidence of pulmonary metastatic disease. Cardiomegaly. Calcified aorta. Shoulder joint degenerative changes greater on the right. IMPRESSION: Mild progression of parenchymal changes left lung base. Question post therapy changes versus infiltrate. Electronically Signed   By: Genia Del M.D.   On: 06/07/2016 14:43   Dg Knee 1-2 Views Right  Result Date: 06/09/2016 CLINICAL DATA:  Pain following fall EXAM: RIGHT KNEE - 1-2 VIEW COMPARISON:  January 15, 2016 FINDINGS: Frontal and lateral views were obtained it. There is no fracture or dislocation. No appreciable joint effusion. There is moderately severe joint space narrowing medially with moderate narrowing laterally and in the patellofemoral joint region. There is spurring in all compartments. No erosive change. IMPRESSION: Osteoarthritic change, most marked medially. No acute fracture or joint effusion. Electronically Signed   By: Lowella Grip III M.D.   On: 06/09/2016 09:22   Ct Angio Chest Pe W And/or Wo Contrast  Result Date: 07/01/2016 CLINICAL DATA:  Shortness of breath and chills. Dyspnea. Esophageal cancer. EXAM: CT ANGIOGRAPHY CHEST WITH CONTRAST TECHNIQUE: Multidetector CT imaging of the chest was performed using the standard protocol during bolus administration of intravenous contrast. Multiplanar CT image reconstructions and MIPs were obtained to evaluate the vascular anatomy. CONTRAST:  75 cc Isovue 370 COMPARISON:  Multiple exams, including 07/01/2016 and 05/29/2015 FINDINGS: Despite efforts by the technologist and patient, motion artifact is present on today's exam and could not be eliminated. This reduces exam sensitivity and specificity. Cardiovascular: No filling defect is identified in the pulmonary arterial tree to suggest pulmonary embolus. Sensitivity in some segments of the pulmonary arterial tree is reduced due to motion artifact. Aortoiliac atherosclerotic vascular disease. Mild cardiomegaly. There is poor contrast  opacification in the systemic arterial structures and accordingly sensitivity for some systemic arterial vascular abnormalities is reduced. Mediastinum/Nodes: Scattered small mediastinal lymph nodes. Right hilar node 1.1 cm in short axis on image 44/4, mildly enlarged. Distal esophageal wall thickening with some irregularity, concerning for tumor in this vicinity. Lungs/Pleura: Mild airspace opacity posteriorly in the right upper lobe as on image 36/10, suspicious for pneumonia. Stable pleural calcifications posteriorly along the right upper lobe. There is some tiny pulmonary nodules in the right lung which are stable and probably from old granulomatous disease. Bandlike atelectasis in the posterior basal segment left lower lobe. Upper Abdomen: Unremarkable Musculoskeletal: Old right lower lateral rib fracture, healed. Degenerative glenohumeral arthropathy bilaterally is partially included on today' s exam. Thoracic spondylosis and mild upper thoracic kyphosis. The thoracic degenerative disc disease at several levels. Review of the MIP images confirms the above findings. IMPRESSION: 1. No filling defect is identified in the pulmonary arterial tree to suggest pulmonary embolus. Mildly reduced sensitivity due to motion artifact. 2. Mild airspace opacity posteriorly in the right upper lobe, suspicious for pneumonia. 3. Indistinct thickened distal esophagus, likely related to patient's known esophageal malignancy. 4. Mild right hilar adenopathy, this may be reactive. 5. Coronary, aortic arch, and branch vessel  atherosclerotic vascular disease. 6. Mild cardiomegaly. 7. Mild atelectasis in the left lower lobe. Electronically Signed   By: Van Clines M.D.   On: 07/01/2016 17:08   Dg Femur Min 2 Views Left  Result Date: 06/09/2016 CLINICAL DATA:  Pain following fall EXAM: LEFT FEMUR 2 VIEWS COMPARISON:  None. FINDINGS: Frontal and lateral views were obtained. There is no acute fracture or dislocation. There is  moderate osteoarthritic change in the left hip joint. There is no knee joint effusion. There is atherosclerotic calcification in the superficial femoral artery. IMPRESSION: Osteoarthritic change left hip joint. No acute fracture or dislocation. Superficial femoral artery atherosclerosis. Electronically Signed   By: Lowella Grip III M.D.   On: 06/09/2016 09:21    ASSESSMENT & PLAN:   # 76 year old male patient with locally recurrent esophageal cancer status post chemoradiation [finished February 2018] currently admitted to the hospital for weight loss for her to thrive difficulty swallowing.  # Difficulty swallowing/pain while swallowing/regurgitation- likely secondary to progressive malignancy in the lower esophagus. Agree with Carafate. Agree with barium swallow. I would recommend GI evaluation/consultation. Discussed with the patient regarding the possible use of PEG tube. He continues to decline at this time.  # Locally advanced/recurrent esophageal cancer- patient status post chemoradiation most recently February 2018. Given the decline in performance status/I'm not sure if patient would tolerate further chemotherapy. I would defer this to Dr. Maryjane Hurter patient's primary oncologist.  # Thank you Dr. Ether Griffins for allowing me to participate in the care of your pleasant patient. Please do not hesitate to contact me with questions or concerns in the interim.   All questions were answered. The patient knows to call the clinic with any problems, questions or concerns.    Cammie Sickle, MD 07/03/2016 11:59 AM

## 2016-07-03 NOTE — Progress Notes (Addendum)
ANTICOAGULATION CONSULT NOTE -   Pharmacy Consult for warfarin dosing  Indication: atrial fibrillation  No Known Allergies  Patient Measurements: Height: 5\' 8"  (172.7 cm) Weight: 185 lb (83.9 kg) IBW/kg (Calculated) : 68.4  Vital Signs: Temp: 98.2 F (36.8 C) (03/18 0748) Temp Source: Oral (03/18 0748) BP: 121/85 (03/18 0748) Pulse Rate: 88 (03/18 0748)  Labs:  Recent Labs  07/01/16 1549 07/01/16 1955 07/01/16 2111 07/02/16 0102 07/02/16 0519 07/03/16 0333  HGB 13.9  --   --   --  11.4* 10.9*  HCT 40.7  --   --   --  32.5* 31.5*  PLT 158  --   --   --  125* 111*  LABPROT  --  32.8*  --   --  32.1* 34.4*  INR  --  3.12  --   --  3.04 3.31  CREATININE 1.10  --   --   --  0.87 0.84  TROPONINI 0.04*  --  0.05* 0.04* 0.05*  --     Estimated Creatinine Clearance: 76.5 mL/min (by C-G formula based on SCr of 0.84 mg/dL).   Medical History: Past Medical History:  Diagnosis Date  . Arthritis   . Atrial fibrillation ([place])   . COPD (chronic obstructive pulmonary disease) ([place])   . Dysrhythmia   . Esophageal cancer ([place])   . Esophageal carcinoma ([place])    a. Chemo: FOLFIRI  . Essential hypertension   . GERD (gastroesophageal reflux disease)   . History of stress test    a. 06/2003 MV: EF 56%, no ischemia/infarct.  . Persistent atrial fibrillation ([place])    a. 07/2013 Echo: EF 60-65%, mod dil LA, mild TR;  CHA2DS2VASc = 3-->chronic coumadin.  . Sleep apnea     Assessment: 76 yo male with PMH of A. FIb,  admitted with PNA. Pharmacy consulted for warfarin dosing and monitoring.   Home Regimen: Warfarin 8 mg daily  DATE  INR DOSE 3/16  3.12 5 mg 3/17  3.04 5 mg 3/18  3.31   Goal of Therapy:  INR 2-3 Monitor platelets by anticoagulation protocol: Yes   Plan:  Patient still slightly supratherapeutic. Patient is also  receiving antibiotics. Will order warfarin 3.5 mg x 1 tonight. Will check INRs daily while on Abxs  Noralee Space, PharmD, BCPS Clinical  Pharmacist 07/03/2016 9:02 AM

## 2016-07-04 ENCOUNTER — Observation Stay (HOSPITAL_BASED_OUTPATIENT_CLINIC_OR_DEPARTMENT_OTHER)
Admit: 2016-07-04 | Discharge: 2016-07-04 | Disposition: A | Discharge status: Home / Self Care | Payer: Medicare Other | Attending: Cardiology | Admitting: Cardiology

## 2016-07-04 ENCOUNTER — Observation Stay: Payer: Medicare Other

## 2016-07-04 DIAGNOSIS — R112 Nausea with vomiting, unspecified: Secondary | ICD-10-CM

## 2016-07-04 DIAGNOSIS — J690 Pneumonitis due to inhalation of food and vomit: Secondary | ICD-10-CM | POA: Diagnosis not present

## 2016-07-04 DIAGNOSIS — R634 Abnormal weight loss: Secondary | ICD-10-CM | POA: Diagnosis not present

## 2016-07-04 DIAGNOSIS — R627 Adult failure to thrive: Secondary | ICD-10-CM

## 2016-07-04 DIAGNOSIS — C155 Malignant neoplasm of lower third of esophagus: Secondary | ICD-10-CM | POA: Diagnosis not present

## 2016-07-04 DIAGNOSIS — Z79899 Other long term (current) drug therapy: Secondary | ICD-10-CM | POA: Diagnosis not present

## 2016-07-04 DIAGNOSIS — I10 Essential (primary) hypertension: Secondary | ICD-10-CM

## 2016-07-04 DIAGNOSIS — Z809 Family history of malignant neoplasm, unspecified: Secondary | ICD-10-CM

## 2016-07-04 DIAGNOSIS — K219 Gastro-esophageal reflux disease without esophagitis: Secondary | ICD-10-CM | POA: Diagnosis not present

## 2016-07-04 DIAGNOSIS — I5022 Chronic systolic (congestive) heart failure: Secondary | ICD-10-CM | POA: Diagnosis not present

## 2016-07-04 DIAGNOSIS — F17210 Nicotine dependence, cigarettes, uncomplicated: Secondary | ICD-10-CM | POA: Diagnosis not present

## 2016-07-04 DIAGNOSIS — M129 Arthropathy, unspecified: Secondary | ICD-10-CM | POA: Diagnosis not present

## 2016-07-04 DIAGNOSIS — G4730 Sleep apnea, unspecified: Secondary | ICD-10-CM | POA: Diagnosis not present

## 2016-07-04 DIAGNOSIS — I427 Cardiomyopathy due to drug and external agent: Secondary | ICD-10-CM

## 2016-07-04 DIAGNOSIS — I4891 Unspecified atrial fibrillation: Secondary | ICD-10-CM | POA: Diagnosis not present

## 2016-07-04 DIAGNOSIS — J449 Chronic obstructive pulmonary disease, unspecified: Secondary | ICD-10-CM | POA: Diagnosis not present

## 2016-07-04 LAB — CBC
HCT: 32.5 % — ABNORMAL LOW (ref 40.0–52.0)
HEMOGLOBIN: 11.1 g/dL — AB (ref 13.0–18.0)
MCH: 30.3 pg (ref 26.0–34.0)
MCHC: 34.2 g/dL (ref 32.0–36.0)
MCV: 88.7 fL (ref 80.0–100.0)
Platelets: 124 10*3/uL — ABNORMAL LOW (ref 150–440)
RBC: 3.66 MIL/uL — AB (ref 4.40–5.90)
RDW: 19.9 % — ABNORMAL HIGH (ref 11.5–14.5)
WBC: 2.8 10*3/uL — AB (ref 3.8–10.6)

## 2016-07-04 LAB — ECHOCARDIOGRAM COMPLETE
HEIGHTINCHES: 68 in
Weight: 2960 oz

## 2016-07-04 LAB — POTASSIUM: POTASSIUM: 3.7 mmol/L (ref 3.5–5.1)

## 2016-07-04 LAB — PROTIME-INR
INR: 3.69
PROTHROMBIN TIME: 37.5 s — AB (ref 11.4–15.2)

## 2016-07-04 MED ORDER — LIDOCAINE VISCOUS 2 % MT SOLN
15.0000 mL | OROMUCOSAL | Status: DC | PRN
  Filled 2016-07-04: qty 15

## 2016-07-04 NOTE — Progress Notes (Signed)
ANTICOAGULATION CONSULT NOTE -   Pharmacy Consult for warfarin dosing  Indication: atrial fibrillation  No Known Allergies  Patient Measurements: Height: 5\' 8"  (172.7 cm) Weight: 185 lb (83.9 kg) IBW/kg (Calculated) : 68.4  Vital Signs: Temp: 97.7 F (36.5 C) (03/19 0844) Temp Source: Oral (03/19 0844) BP: 111/72 (03/19 0844) Pulse Rate: 111 (03/19 0844)  Labs:  Recent Labs  07/01/16 1549  07/01/16 2111 07/02/16 0102 07/02/16 0519 07/03/16 0333 07/04/16 0351  HGB 13.9  --   --   --  11.4* 10.9* 11.1*  HCT 40.7  --   --   --  32.5* 31.5* 32.5*  PLT 158  --   --   --  125* 111* 124*  LABPROT  --   < >  --   --  32.1* 34.4* 37.5*  INR  --   < >  --   --  3.04 3.31 3.69  CREATININE 1.10  --   --   --  0.87 0.84  --   TROPONINI 0.04*  --  0.05* 0.04* 0.05*  --   --   < > = values in this interval not displayed.  Estimated Creatinine Clearance: 76.5 mL/min (by C-G formula based on SCr of 0.84 mg/dL).   Medical History: Past Medical History:  Diagnosis Date  . Arthritis   . Atrial fibrillation ([place])   . COPD (chronic obstructive pulmonary disease) ([place])   . Dysrhythmia   . Esophageal cancer ([place])   . Esophageal carcinoma ([place])    a. Chemo: FOLFIRI  . Essential hypertension   . GERD (gastroesophageal reflux disease)   . History of stress test    a. 06/2003 MV: EF 56%, no ischemia/infarct.  . Persistent atrial fibrillation ([place])    a. 07/2013 Echo: EF 60-65%, mod dil LA, mild TR;  CHA2DS2VASc = 3-->chronic coumadin.  . Sleep apnea     Assessment: 76 yo male with PMH of A. FIb,  admitted with PNA. Pharmacy consulted for warfarin dosing and monitoring.   Home Regimen: Warfarin 8 mg daily  DATE  INR DOSE 3/16  3.12 5 mg 3/17  3.04 5 mg 3/18  3.31 3.5 mg 3/19                 3.69  Goal of Therapy:  INR 2-3 Monitor platelets by anticoagulation protocol: Yes   Plan:  Patient still supratherapeutic. Patient is also  receiving antibiotics. Will hold warfarin  dose tonight. Will check INRs daily while on Abxs  Paulina Fusi, PharmD, BCPS 07/04/2016 11:50 AM

## 2016-07-04 NOTE — Progress Notes (Signed)
[place]  [phone] [phone]  ID: [name] OB: 28-Jun-1940  MR#: [id number]  HER#:[id number]  Patient Care Team: [name], MD as PCP - General (Geriatric Medicine) No Pcp Per Patient (General Practice) Manya Silvas, MD (Gastroenterology)  CHIEF COMPLAINT: Progressive esophageal cancer, failure to thrive.  INTERVAL HISTORY: Patient feels mildly improved since admission. He is able only to tolerate liquid diet. He continues to have significant weakness and fatigue. He denies any fevers, shortness of breath, or cough.  REVIEW OF SYSTEMS:   Review of Systems  Constitutional: Positive for weight loss. Negative for fever.  Respiratory: Negative.  Negative for cough, hemoptysis and shortness of breath.   Cardiovascular: Negative.  Negative for chest pain and leg swelling.  Gastrointestinal: Positive for nausea and vomiting. Negative for abdominal pain.  Musculoskeletal: Negative.   Neurological: Positive for weakness.  Psychiatric/Behavioral: Negative.     As per HPI. Otherwise, a complete review of systems is negative.  PAST MEDICAL HISTORY: Past Medical History:  Diagnosis Date  . Arthritis   . Atrial fibrillation ([place])   . COPD (chronic obstructive pulmonary disease) ([place])   . Dysrhythmia   . Esophageal cancer ([place])   . Esophageal carcinoma ([place])    a. Chemo: FOLFIRI  . Essential hypertension   . GERD (gastroesophageal reflux disease)   . History of stress test    a. 06/2003 MV: EF 56%, no ischemia/infarct.  . Persistent atrial fibrillation ([place])    a. 07/2013 Echo: EF 60-65%, mod dil LA, mild TR;  CHA2DS2VASc = 3-->chronic coumadin.  . Sleep apnea     PAST SURGICAL HISTORY: Past Surgical History:  Procedure Laterality Date  . ESOPHAGOGASTRODUODENOSCOPY (EGD) WITH PROPOFOL N/A 08/31/2015   Procedure: ESOPHAGOGASTRODUODENOSCOPY (EGD) WITH PROPOFOL;  Surgeon: Manya Silvas, MD;  Location: [facility] ENDOSCOPY;   Service: Endoscopy;  Laterality: N/A;  . ESOPHAGOGASTRODUODENOSCOPY (EGD) WITH PROPOFOL N/A 02/26/2016   Procedure: ESOPHAGOGASTRODUODENOSCOPY (EGD) WITH PROPOFOL;  Surgeon: Manya Silvas, MD;  Location: [facility] ENDOSCOPY;  Service: Endoscopy;  Laterality: N/A;  . FEMUR IM NAIL Right 01/17/2016   Procedure: INTRAMEDULLARY (IM) NAIL FEMORAL;  Surgeon: Earnestine Leys, MD;  Location: [facility] ORS;  Service: Orthopedics;  Laterality: Right;  . HERNIA REPAIR    . PERIPHERAL VASCULAR CATHETERIZATION N/A 11/20/2014   Procedure: Glori Luis Cath Insertion;  Surgeon: Algernon Huxley, MD;  Location: [place] CV LAB;  Service: Cardiovascular;  Laterality: N/A;    FAMILY HISTORY: Family History  Problem Relation Age of Onset  . Cancer Mother   . Stroke Father   . Hypertension Sister   . Hypertension Brother     ADVANCED DIRECTIVES (Y/N):  @ADVDIR @  HEALTH MAINTENANCE: Social History  Substance Use Topics  . Smoking status: Current Some Day Smoker    Years: 62.00  . Smokeless tobacco: Current User    Types: Snuff  . Alcohol use No     Colonoscopy:  PAP:  Bone density:  Lipid panel:  No Known Allergies  Current Facility-Administered Medications  Medication Dose Route Frequency Provider Last Rate Last Dose  . acetaminophen (TYLENOL) tablet 650 mg  650 mg Oral Q6H PRN Henreitta Leber, MD       Or  . acetaminophen (TYLENOL) suppository 650 mg  650 mg Rectal Q6H PRN Henreitta Leber, MD      . albuterol (PROVENTIL) (2.5 MG/3ML) 0.083% nebulizer solution 2.5 mg  2.5 mg Nebulization Q6H PRN Henreitta Leber, MD      .  albuterol (PROVENTIL) (2.5 MG/3ML) 0.083% nebulizer solution 3 mL  3 mL Inhalation TID Henreitta Leber, MD   3 mL at 07/04/16 0859  . budesonide (PULMICORT) nebulizer solution 0.25 mg  0.25 mg Nebulization BID Henreitta Leber, MD   0.25 mg at 07/04/16 0859  . carvedilol (COREG) tablet 3.125 mg  3.125 mg Oral BID WC Theodoro Grist, MD   3.125 mg at 07/03/16 0902  . finasteride (PROSCAR)  tablet 5 mg  5 mg Oral Daily Henreitta Leber, MD   5 mg at 07/03/16 0902  . gabapentin (NEURONTIN) capsule 300 mg  300 mg Oral BID Henreitta Leber, MD   300 mg at 07/03/16 2335  . morphine CONCENTRATE 10 MG/0.5ML oral solution 10 mg  10 mg Oral Q6H PRN Henreitta Leber, MD      . ondansetron [facility]) tablet 4 mg  4 mg Oral Q6H PRN Henreitta Leber, MD       Or  . ondansetron (ZOFRAN) injection 4 mg  4 mg Intravenous Q6H PRN Henreitta Leber, MD   4 mg at 07/02/16 0923  . oxyCODONE (ROXICODONE INTENSOL) 20 MG/ML concentrated solution 10 mg  10 mg Oral Q4H PRN Theodoro Grist, MD      . pantoprazole (PROTONIX) EC tablet 40 mg  40 mg Oral Daily Henreitta Leber, MD   40 mg at 07/03/16 0902  . piperacillin-tazobactam (ZOSYN) IVPB 3.375 g  3.375 g Intravenous Q8H Sheema M Hallaji, RPH   3.375 g at 07/04/16 3500  . polyethylene glycol (MIRALAX / GLYCOLAX) packet 17 g  17 g Oral Daily Theodoro Grist, MD   17 g at 07/03/16 0924  . senna (SENOKOT) tablet 8.6 mg  1 tablet Oral BID Henreitta Leber, MD   8.6 mg at 07/03/16 2335  . sodium chloride flush (NS) 0.9 % injection 3 mL  3 mL Intravenous Q12H Henreitta Leber, MD   3 mL at 07/04/16 1000  . sucralfate (CARAFATE) 1 GM/10ML suspension 1 g  1 g Oral Q6H Theodoro Grist, MD   1 g at 07/04/16 0300  . tamsulosin (FLOMAX) capsule 0.4 mg  0.4 mg Oral Daily Henreitta Leber, MD   0.4 mg at 07/03/16 0902  . Warfarin - Pharmacist Dosing Inpatient   Does not apply q1800 Sheema M Hallaji, RPH       Facility-Administered Medications Ordered in Other Encounters  Medication Dose Route Frequency Provider Last Rate Last Dose  . sodium chloride 0.9 % injection 10 mL  10 mL Intravenous PRN Lloyd Huger, MD   10 mL at 12/23/14 0945  . sodium chloride 0.9 % injection 10 mL  10 mL Intravenous PRN Lloyd Huger, MD   10 mL at 01/15/15 1512  . sodium chloride flush (NS) 0.9 % injection 10 mL  10 mL Intravenous PRN Lloyd Huger, MD   10 mL at 11/10/15 0939     OBJECTIVE: Vitals:   07/04/16 0403 07/04/16 0844  BP: 102/78 111/72  Pulse: (!) 59 (!) 111  Resp: 18 18  Temp:  97.7 F (36.5 C)     Body mass index is 28.13 kg/m.    ECOG FS:2 - Symptomatic, <50% confined to bed  General: Well-developed, well-nourished, no acute distress. Eyes: Pink conjunctiva, anicteric sclera. HEENT: Normocephalic, moist mucous membranes, clear oropharnyx. Lungs: Clear to auscultation bilaterally. Heart: Regular rate and rhythm. No rubs, murmurs, or gallops. Abdomen: Soft, nontender, nondistended. No organomegaly noted, normoactive bowel sounds. Musculoskeletal: No  edema, cyanosis, or clubbing. Neuro: Alert, answering all questions appropriately. Cranial nerves grossly intact. Skin: No rashes or petechiae noted. Psych: Normal affect.    LAB RESULTS:  Lab Results  Component Value Date   NA 139 07/03/2016   K 3.7 07/04/2016   CL 108 07/03/2016   CO2 28 07/03/2016   GLUCOSE 89 07/03/2016   BUN 15 07/03/2016   CREATININE 0.84 07/03/2016   CALCIUM 8.0 (L) 07/03/2016   PROT 5.7 (L) 06/21/2016   ALBUMIN 2.7 (L) 06/21/2016   AST 27 06/21/2016   ALT 14 (L) 06/21/2016   ALKPHOS 91 06/21/2016   BILITOT 0.6 06/21/2016   GFRNONAA >60 07/03/2016   GFRAA >60 07/03/2016    Lab Results  Component Value Date   WBC 2.8 (L) 07/04/2016   NEUTROABS 4.4 06/21/2016   HGB 11.1 (L) 07/04/2016   HCT 32.5 (L) 07/04/2016   MCV 88.7 07/04/2016   PLT 124 (L) 07/04/2016     STUDIES: Dg Chest 2 View  Result Date: 07/01/2016 CLINICAL DATA:  Shortness of breath and chills EXAM: CHEST  2 VIEW COMPARISON:  06/07/2016 FINDINGS: Right-sided central venous port tip overlies the proximal right atrium. No large pleural effusion. Subtle opacity in the right upper lobe. Stable mild cardiomegaly. No overt failure. No pneumothorax. 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 IMPRESSION: 1. Subtle opacity in the right upper lobe could relate to a  mild infiltrate. 2. Stable mild cardiomegaly Electronically Signed   By: Donavan Foil M.D.   On: 07/01/2016 16:20   Dg Chest 2 View  Result Date: 06/07/2016 CLINICAL DATA:  76 year old male with shortness breath for 4 days. Symptoms started after radiation therapy treatment for esophageal cancer. Initial encounter. EXAM: CHEST  2 VIEW COMPARISON:  01/15/2016 01/26/2015 chest x-ray. FINDINGS: Mild progression of parenchymal changes left lung base. Question post therapy changes versus infiltrate. Central pulmonary vascular prominence stable. Right central line tip distal superior vena cava. No plain film evidence of pulmonary metastatic disease. Cardiomegaly. Calcified aorta. Shoulder joint degenerative changes greater on the right. IMPRESSION: Mild progression of parenchymal changes left lung base. Question post therapy changes versus infiltrate. Electronically Signed   By: Genia Del M.D.   On: 06/07/2016 14:43   Dg Knee 1-2 Views Right  Result Date: 06/09/2016 CLINICAL DATA:  Pain following fall EXAM: RIGHT KNEE - 1-2 VIEW COMPARISON:  January 15, 2016 FINDINGS: Frontal and lateral views were obtained it. There is no fracture or dislocation. No appreciable joint effusion. There is moderately severe joint space narrowing medially with moderate narrowing laterally and in the patellofemoral joint region. There is spurring in all compartments. No erosive change. IMPRESSION: Osteoarthritic change, most marked medially. No acute fracture or joint effusion. Electronically Signed   By: Lowella Grip III M.D.   On: 06/09/2016 09:22   Ct Angio Chest Pe W And/or Wo Contrast  Result Date: 07/01/2016 CLINICAL DATA:  Shortness of breath and chills. Dyspnea. Esophageal cancer. EXAM: CT ANGIOGRAPHY CHEST WITH CONTRAST TECHNIQUE: Multidetector CT imaging of the chest was performed using the standard protocol during bolus administration of intravenous contrast. Multiplanar CT image reconstructions and MIPs were  obtained to evaluate the vascular anatomy. CONTRAST:  75 cc Isovue 370 COMPARISON:  Multiple exams, including 07/01/2016 and 05/29/2015 FINDINGS: Despite efforts by the technologist and patient, motion artifact is present on today's exam and could not be eliminated. This reduces exam sensitivity and specificity. Cardiovascular: No filling defect is identified in the pulmonary arterial tree to  suggest pulmonary embolus. Sensitivity in some segments of the pulmonary arterial tree is reduced due to motion artifact. Aortoiliac atherosclerotic vascular disease. Mild cardiomegaly. There is poor contrast opacification in the systemic arterial structures and accordingly sensitivity for some systemic arterial vascular abnormalities is reduced. Mediastinum/Nodes: Scattered small mediastinal lymph nodes. Right hilar node 1.1 cm in short axis on image 44/4, mildly enlarged. Distal esophageal wall thickening with some irregularity, concerning for tumor in this vicinity. Lungs/Pleura: Mild airspace opacity posteriorly in the right upper lobe as on image 36/10, suspicious for pneumonia. Stable pleural calcifications posteriorly along the right upper lobe. There is some tiny pulmonary nodules in the right lung which are stable and probably from old granulomatous disease. Bandlike atelectasis in the posterior basal segment left lower lobe. Upper Abdomen: Unremarkable Musculoskeletal: Old right lower lateral rib fracture, healed. Degenerative glenohumeral arthropathy bilaterally is partially included on today' s exam. Thoracic spondylosis and mild upper thoracic kyphosis. The thoracic degenerative disc disease at several levels. Review of the MIP images confirms the above findings. IMPRESSION: 1. No filling defect is identified in the pulmonary arterial tree to suggest pulmonary embolus. Mildly reduced sensitivity due to motion artifact. 2. Mild airspace opacity posteriorly in the right upper lobe, suspicious for pneumonia. 3.  Indistinct thickened distal esophagus, likely related to patient's known esophageal malignancy. 4. Mild right hilar adenopathy, this may be reactive. 5. Coronary, aortic arch, and branch vessel atherosclerotic vascular disease. 6. Mild cardiomegaly. 7. Mild atelectasis in the left lower lobe. Electronically Signed   By: Van Clines M.D.   On: 07/01/2016 17:08   Dg Esophagus  Result Date: 07/04/2016 CLINICAL DATA:  Dysphagia.  Stomach cancer . EXAM: ESOPHOGRAM/BARIUM SWALLOW TECHNIQUE: Single contrast examination was performed using thin barium or water soluble. FLUOROSCOPY TIME:  Fluoroscopy Time:  1 minutes 6 seconds. Radiation Exposure Index (if provided by the fluoroscopic device): 28.9 mGy Number of Acquired Spot Images: 7 COMPARISON:  CT 07/01/2016. FINDINGS: Distal esophageal severe irregular narrowing over approximately 8 cm segment. High-grade partial obstruction. These findings are worrisome for malignant high-grade partial obstruction of the distal esophagus. IMPRESSION: Distal esophageal severe irregular narrowing with near obstruction. This involves approximately 8 cm of the distal most portion of the esophagus. Findings are worrisome for esophageal malignancy. These results will be called to the ordering clinician or representative by the Radiologist Assistant, and communication documented in the PACS or zVision Dashboard. Electronically Signed   By: Marcello Moores  Register   On: 07/04/2016 13:22   Dg Femur Min 2 Views Left  Result Date: 06/09/2016 CLINICAL DATA:  Pain following fall EXAM: LEFT FEMUR 2 VIEWS COMPARISON:  None. FINDINGS: Frontal and lateral views were obtained. There is no acute fracture or dislocation. There is moderate osteoarthritic change in the left hip joint. There is no knee joint effusion. There is atherosclerotic calcification in the superficial femoral artery. IMPRESSION: Osteoarthritic change left hip joint. No acute fracture or dislocation. Superficial femoral artery  atherosclerosis. Electronically Signed   By: Lowella Grip III M.D.   On: 06/09/2016 09:21    ASSESSMENT: Progressive esophageal cancer, failure to thrive.  PLAN:    1. Esophageal cancer: Locally recurrent, patient last received treatment at the end of February. Barium swallow results noted. No further treatments are planned at this time. Patient continues to deny PEG tube. Have recommended hospice, but patient continues to be unclear if he wishes to pursue this. Recommend palliative care consult. 2. Difficulty swallowing: Secondary to progressive malignancy. Patient refusing PEG tube as above.  Will follow.  Swallow results noted   Lloyd Huger, MD   07/04/2016 2:15 PM

## 2016-07-04 NOTE — Progress Notes (Signed)
*  PRELIMINARY RESULTS* Echocardiogram 2D Echocardiogram has been performed.  Jeremy Gordon 07/04/2016, 10:14 AM

## 2016-07-04 NOTE — Progress Notes (Signed)
Patient Name: Jeremy Gordon Date of Encounter: 07/04/2016  Primary Cardiologist: New to [facility] - consult by [facility] Problem List     Active Problems:   Pneumonia     Subjective   Continues to note diffuse abdominal pain. Remains on nasal cannula at 2 L. Heart rate well controlled in Afib. Echo this admission pending. No chest pain. Minimal troponin elevation with a peak of 0.05. HGB stable, though pancytopenic likely 2/2 cancer with chemotherapy. K+ improved.   Inpatient Medications    Scheduled Meds: . albuterol  3 mL Inhalation TID  . budesonide (PULMICORT) nebulizer solution  0.25 mg Nebulization BID  . carvedilol  3.125 mg Oral BID WC  . finasteride  5 mg Oral Daily  . gabapentin  300 mg Oral BID  . pantoprazole  40 mg Oral Daily  . piperacillin-tazobactam (ZOSYN)  IV  3.375 g Intravenous Q8H  . polyethylene glycol  17 g Oral Daily  . senna  1 tablet Oral BID  . sodium chloride flush  3 mL Intravenous Q12H  . sucralfate  1 g Oral Q6H  . tamsulosin  0.4 mg Oral Daily  . Warfarin - Pharmacist Dosing Inpatient   Does not apply q1800   Continuous Infusions:  PRN Meds: acetaminophen **OR** acetaminophen, albuterol, morphine CONCENTRATE, ondansetron **OR** ondansetron (ZOFRAN) IV, oxyCODONE   Vital Signs    Vitals:   07/03/16 1915 07/03/16 2034 07/03/16 2334 07/04/16 0403  BP: (!) 108/50  116/72 102/78  Pulse: 100  97 (!) 59  Resp: 18  18 18   Temp: 97.4 F (36.3 C)  99 F (37.2 C)   TempSrc: Oral  Oral   SpO2: 100% 100% 98% 99%  Weight:      Height:        Intake/Output Summary (Last 24 hours) at 07/04/16 0829 Last data filed at 07/04/16 0819  Gross per 24 hour  Intake              860 ml  Output             1850 ml  Net             -990 ml   Filed Weights   07/01/16 1523  Weight: 185 lb (83.9 kg)    Physical Exam    GEN: Frail appearing, in no acute distress.  HEENT: Grossly normal, hoarse voice.  Neck: Supple, no JVD, carotid bruits,  or masses. Cardiac: Irregularly irregular, no murmurs, rubs, or gallops. No clubbing, cyanosis, edema.  Radials/DP/PT 2+ and equal bilaterally.  Respiratory:  Diminished breath sounds bilaterally. GI: Soft, diffusely tender, nondistended, BS + x 4. MS: no deformity or atrophy. Skin: warm and dry, no rash. Neuro:  Strength and sensation are intact. Psych: AAOx3.  Normal affect.  Labs    CBC  Recent Labs  07/03/16 0333 07/04/16 0351  WBC 2.8* 2.8*  HGB 10.9* 11.1*  HCT 31.5* 32.5*  MCV 89.3 88.7  PLT 111* 007*   Basic Metabolic Panel  Recent Labs  07/02/16 0519 07/03/16 0333 07/04/16 0351  NA 134* 139  --   K 3.9 3.3* 3.7  CL 104 108  --   CO2 24 28  --   GLUCOSE 104* 89  --   BUN 16 15  --   CREATININE 0.87 0.84  --   CALCIUM 7.8* 8.0*  --    Liver Function Tests No results for input(s): AST, ALT, ALKPHOS, BILITOT, PROT, ALBUMIN in the last  72 hours. No results for input(s): LIPASE, AMYLASE in the last 72 hours. Cardiac Enzymes  Recent Labs  07/01/16 2111 07/02/16 0102 07/02/16 0519  TROPONINI 0.05* 0.04* 0.05*   BNP Invalid input(s): POCBNP D-Dimer No results for input(s): DDIMER in the last 72 hours. Hemoglobin A1C No results for input(s): HGBA1C in the last 72 hours. Fasting Lipid Panel No results for input(s): CHOL, HDL, LDLCALC, TRIG, CHOLHDL, LDLDIRECT in the last 72 hours. Thyroid Function Tests No results for input(s): TSH, T4TOTAL, T3FREE, THYROIDAB in the last 72 hours.  Invalid input(s): FREET3  Telemetry    Afib, 80s to 90s bpm - Personally Reviewed  ECG    n/a - Personally Reviewed  Radiology    No results found.  Cardiac Studies   TTE this admission pending  TTE from 01/15/2016 read by Dr. Humphrey Rolls (also seen by Dr. Humphrey Rolls in consultation that admission) Study Conclusions  - Left ventricle: The cavity size was moderately dilated. Systolic   function was moderately to severely reduced. The estimated   ejection fraction was  35%. Diffuse hypokinesis. The study is not   technically sufficient to allow evaluation of LV diastolic   function. - Aortic valve: There was trivial regurgitation. - Mitral valve: Calcified annulus. There was moderate   regurgitation.  Impressions:  - There is severe four-chamber dilatation with severe LV   dysfunction left ventricular ejection fraction 32%. There is mild   to moderate mitral regurgitation and mild tricuspid regurgitation   with normal pulmonary pressures. This no evidence of any thrombi   and cardiac chambers.  TTE 01/2015: Study Conclusions  - Left ventricle: The cavity size was normal. There was moderate   concentric hypertrophy. Systolic function was mildly reduced. The   estimated ejection fraction was in the range of 45% to 50%.   Regional wall motion abnormalities cannot be excluded. The study   is not technically sufficient to allow evaluation of LV diastolic   function. - Mitral valve: There was mild regurgitation. - Left atrium: The atrium was moderately dilated. - Right ventricle: The cavity size was mildly dilated. Wall   thickness was normal. Systolic function was normal. - Right atrium: The atrium was moderately dilated. - Tricuspid valve: There was mild-moderate regurgitation. - Pulmonary arteries: Systolic pressure was mildly elevated PA peak   pressure: 37 mm Hg (S).  Impressions:  - Challenging image quality.  Patient Profile     76 y.o. 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 Cardiology asked to evaluate for minimal troponin elevation and cardiomyopathy from 12/2015 when patient was seen by  cardiology at that time.   Assessment & Plan    1. Elevated troponin: -No symptoms concerning for angina -Minimal elevation of 0.05 -Unlikely to be an interventional candidate given his failure to thrive, pancytopenia, and recurrent adenocarcinoma of the esophagus that is not amenable to further treatment -Echo pending per IM -On warfarin in place of ASA  2. Chronic systolic CHF: -Known since at least 01/2015 when EF of 45% at that time -Echo recently 12/2015 with EF 35%, seen by outside cardiologist in the hospital (Dr. Humphrey Rolls), never as an outpatient -Echo pending as above -Not an  interventional candidate given the above -Continue medical therapy with Coreg and resuming ACEi once BP allows -Digoxin has been discontined given failure to thrive as risks possibly outweigh any benefit at this time  3. Permanent Afib: -Currently rate controlled -Coreg as above -Digoxin has been discontinued as above -Warfarin per pharmacy -Consider transitioning to [place] if ok with oncology -CHADS2VASc at least 4 (CHF, HTN, age x 2)   4. Failure to thrive: -Consider Palliative care consult for goals of care -Previous mention of Hospice  5. 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 Pancytopenia: -Per IM -Likely 2/2 cancer/chemo  Signed, Christell Faith, PA-C [place] Pager: [phone] 07/04/2016, 8:29 AM

## 2016-07-04 NOTE — Progress Notes (Signed)
[facility] NAME: Jeremy Gordon    MR#:  [id number]  DATE OF BIRTH:  12/19/1940  SUBJECTIVE:  CHIEF COMPLAINT:   Chief Complaint  Patient presents with  . Shortness of Breath  The patient is 76 year old Caucasian male with past medical history significant for history of recent admission for pneumonia, after which patient was discharged to skilled nursing facility and discharge back home about 2 days ago, who presents back to the hospital with complaints of poor oral intake, nausea, vomiting, epigastric abdominal pain, chest x-ray revealed recurrent pneumonia and he was admitted. He remains very uncomfortable, complains of epigastric abdominal discomfort, nausea, intermittent vomiting. His troponin was found to be elevated 0.04-0.05. His sodium was found to be low as well, felt to be dehydration related. No other cell count elevation, but mild anemia. No bleeding was noted. Lactic acid level was found to be elevated at 2.7, improved with IV fluid administration. Patient feels better today, still complains of Mid chest pain, able to swallow liquids, however, not on solids. No significant shortness of breath, or sputum production. He was seen by cardiologist, who recommended to continue beta blockers, discontinue digoxin. Cardiologist felt that patient's mild elevation of troponin due to demand ischemia due to pneumonia, sepsis, hypotension. It was felt the patient was poor candidate for invasive therapy. Patient was seen by oncologist, who recommended palliative care consultation, he also discussed with patient. PEG tube placement, however, patient declined it again. Oncologist did not recommend any further oncologic therapy. Barium swallowing was performed today, revealing significant narrowing of distal esophagus, near obstruction.   Review of Systems  Constitutional: Negative for chills, fever and weight loss.  HENT: Negative for  congestion.   Eyes: Negative for blurred vision and double vision.  Respiratory: Positive for cough, sputum production and shortness of breath. Negative for wheezing.   Cardiovascular: Negative for chest pain, palpitations, orthopnea, leg swelling and PND.  Gastrointestinal: Positive for abdominal pain, nausea and vomiting. Negative for blood in stool, constipation and diarrhea.  Genitourinary: Negative for dysuria, frequency, hematuria and urgency.  Musculoskeletal: Negative for falls.  Neurological: Negative for dizziness, tremors, focal weakness and headaches.  Endo/Heme/Allergies: Does not bruise/bleed easily.  Psychiatric/Behavioral: Negative for depression. The patient does not have insomnia.     VITAL SIGNS: Blood pressure 111/72, pulse (!) 111, temperature 97.7 F (36.5 C), temperature source Oral, resp. rate 18, height 5\' 8"  (1.727 m), weight 83.9 kg (185 lb), SpO2 95 %.  PHYSICAL EXAMINATION:   GENERAL:  76 y.o.-year-old patient sitting in the bed in mild discomfort due to mid chest pain EYES: Pupils equal, round, reactive to light and accommodation. No scleral icterus. Extraocular muscles intact.  HEENT: Head atraumatic, normocephalic. Oropharynx and nasopharynx clear.  NECK:  Supple, no jugular venous distention. No thyroid enlargement, no tenderness.  LUNGS: Normal breath sounds bilaterally, no wheezing, rales,rhonchi , few scattered right-sided crepitations noted anteriorly. No use of accessory muscles of respiration.  CARDIOVASCULAR: S1, S2 , regularly irregular. No murmurs, rubs, or gallops.  ABDOMEN: Soft, tender in the epigastric area, but no rebound or guarding,, nondistended. Bowel sounds present. No organomegaly or mass.  EXTREMITIES: No pedal edema, cyanosis, or clubbing.  NEUROLOGIC: Cranial nerves II through XII are intact. Muscle strength 5/5 in all extremities. Sensation intact. Gait not checked.  PSYCHIATRIC: The patient is alert and oriented x 3.  SKIN: No  obvious rash, lesion, or ulcer.   ORDERS/RESULTS REVIEWED:   CBC  Recent Labs Lab 07/01/16 1549 07/02/16 0519 07/03/16 0333 07/04/16 0351  WBC 6.8 5.0 2.8* 2.8*  HGB 13.9 11.4* 10.9* 11.1*  HCT 40.7 32.5* 31.5* 32.5*  PLT 158 125* 111* 124*  MCV 88.4 88.6 89.3 88.7  MCH 30.1 31.0 31.0 30.3  MCHC 34.0 35.0 34.7 34.2  RDW 19.7* 19.0* 19.1* 19.9*   ------------------------------------------------------------------------------------------------------------------  Chemistries   Recent Labs Lab 07/01/16 1549 07/02/16 0519 07/03/16 0333 07/04/16 0351  NA 134* 134* 139  --   K 4.7 3.9 3.3* 3.7  CL 99* 104 108  --   CO2 27 24 28   --   GLUCOSE 121* 104* 89  --   BUN 18 16 15   --   CREATININE 1.10 0.87 0.84  --   CALCIUM 8.9 7.8* 8.0*  --    ------------------------------------------------------------------------------------------------------------------ estimated creatinine clearance is 76.5 mL/min (by C-G formula based on SCr of 0.84 mg/dL). ------------------------------------------------------------------------------------------------------------------ No results for input(s): TSH, T4TOTAL, T3FREE, THYROIDAB in the last 72 hours.  Invalid input(s): FREET3  Cardiac Enzymes  Recent Labs Lab 07/01/16 2111 07/02/16 0102 07/02/16 0519  TROPONINI 0.05* 0.04* 0.05*   ------------------------------------------------------------------------------------------------------------------ Invalid input(s): POCBNP ---------------------------------------------------------------------------------------------------------------  RADIOLOGY: Dg Esophagus  Result Date: 07/04/2016 CLINICAL DATA:  Dysphagia.  Stomach cancer . EXAM: ESOPHOGRAM/BARIUM SWALLOW TECHNIQUE: Single contrast examination was performed using thin barium or water soluble. FLUOROSCOPY TIME:  Fluoroscopy Time:  1 minutes 6 seconds. Radiation Exposure Index (if provided by the fluoroscopic device): 28.9 mGy Number of  Acquired Spot Images: 7 COMPARISON:  CT 07/01/2016. FINDINGS: Distal esophageal severe irregular narrowing over approximately 8 cm segment. High-grade partial obstruction. These findings are worrisome for malignant high-grade partial obstruction of the distal esophagus. IMPRESSION: Distal esophageal severe irregular narrowing with near obstruction. This involves approximately 8 cm of the distal most portion of the esophagus. Findings are worrisome for esophageal malignancy. These results will be called to the ordering clinician or representative by the Radiologist Assistant, and communication documented in the PACS or zVision Dashboard. Electronically Signed   By: Marcello Moores  Register   On: 07/04/2016 13:22    EKG:  Orders placed or performed during the hospital encounter of 07/01/16  . ED EKG within 10 minutes  . ED EKG within 10 minutes    ASSESSMENT AND PLAN:  Active Problems:   Pneumonia   Chronic systolic heart failure ([place])  #1. Right upper lobe  aspiration pneumonitis, continue patient on Zosyn for now,  change to liquid Augmentin upon discharge, sputum culture is pending, MRSA PCR was negative,  continue oxygen therapy, nebulizers, clinically improved  #2. Atrial fibrillation, rate controlled. Continue, Coreg, Coumadin therapy . The patient was seen by cardiologist, who recommended to discontinue digoxin. #3. Epigastric, mid chest pain, patient had EGD in November 2017, revealing friable esophagus, he has known distal esophageal cancer, barium swallowing study revealed severe narrowing of the distal esophagus, near obstruction ,  continue carafate, Protonix,  full liquid diet,  patient refused PEG tube placement again. Palliative care consultation is requested #4. Hyponatremia,  improved with IV fluids, follow sodium level intermittently #5 elevated troponin, echocardiogram in September 2017 revealed ejection fraction of 35%, cardiologist saw the patient in consultation, recommended to  continue  Coreg,  unable to use ACE inhibitor due to hypotension #6. Lactic acidosis, resolved #7. Anemia, no bleeding noted, stable hemoglobin level, patient is on Coumadin therapy, following INR in the morning, it was supratherapeutic  #8 failure to thrive, adult, continue  full liquid diet, Carafate. Patient has known esophageal cancer,  which is 30 and distal esophagus, no oncologic interventions were recommended by oncologist, but PEG tube which patient refused, barium swallowing study revealed significant narrowing, near obstruction, patient was explained that he would not be able to eat any solid diet from now on, he is to continue full liquid diet as long as he can. As above, he refused PEG tube placement #9 odynophagia, due to esophageal cancer, supportive therapy was Carafate, Protonix, clinically somewhat improved, add Viscous Lidocaine.   Management plans discussed with the patient, family and they are in agreement.   DRUG ALLERGIES: No Known Allergies  CODE STATUS:     Code Status Orders        Start     Ordered   07/01/16 2054  Full code  Continuous     07/01/16 2053    Code Status History    Date Active Date Inactive Code Status Order ID Comments User Context   06/07/2016  6:02 PM 06/09/2016  6:46 PM Full Code [id number]  Dustin Flock, MD Inpatient   01/17/2016 10:54 AM 01/19/2016  8:02 PM Full Code [id number]  Earnestine Leys, MD Inpatient   01/15/2016  3:36 PM 01/15/2016 10:25 PM Full Code [id number]  Demetrios Loll, MD Inpatient   01/26/2015  6:21 PM 02/02/2015  6:18 PM Full Code [id number]  Vaughan Basta, MD ED      TOTAL TIME TAKING CARE OF THIS PATIENT: 35 minutes.    Theodoro Grist M.D on 07/04/2016 at 3:21 PM  Between 7am to 6pm - Pager - [phone]  After 6pm go to [email or web address] - password [email or web address] [facility]  Office  [phone]  CC: Primary care physician; [name], MD

## 2016-07-05 DIAGNOSIS — E872 Acidosis, unspecified: Secondary | ICD-10-CM

## 2016-07-05 DIAGNOSIS — D649 Anemia, unspecified: Secondary | ICD-10-CM

## 2016-07-05 DIAGNOSIS — R778 Other specified abnormalities of plasma proteins: Secondary | ICD-10-CM

## 2016-07-05 DIAGNOSIS — I426 Alcoholic cardiomyopathy: Secondary | ICD-10-CM

## 2016-07-05 DIAGNOSIS — R627 Adult failure to thrive: Secondary | ICD-10-CM

## 2016-07-05 DIAGNOSIS — J690 Pneumonitis due to inhalation of food and vomit: Secondary | ICD-10-CM | POA: Diagnosis not present

## 2016-07-05 DIAGNOSIS — E871 Hypo-osmolality and hyponatremia: Secondary | ICD-10-CM

## 2016-07-05 DIAGNOSIS — C159 Malignant neoplasm of esophagus, unspecified: Secondary | ICD-10-CM

## 2016-07-05 DIAGNOSIS — R7989 Other specified abnormal findings of blood chemistry: Secondary | ICD-10-CM

## 2016-07-05 LAB — CBC
HEMATOCRIT: 32.9 % — AB (ref 40.0–52.0)
HEMOGLOBIN: 11.5 g/dL — AB (ref 13.0–18.0)
MCH: 30.7 pg (ref 26.0–34.0)
MCHC: 34.9 g/dL (ref 32.0–36.0)
MCV: 87.9 fL (ref 80.0–100.0)
Platelets: 135 10*3/uL — ABNORMAL LOW (ref 150–440)
RBC: 3.74 MIL/uL — AB (ref 4.40–5.90)
RDW: 19.1 % — ABNORMAL HIGH (ref 11.5–14.5)
WBC: 2.9 10*3/uL — ABNORMAL LOW (ref 3.8–10.6)

## 2016-07-05 LAB — PROTIME-INR
INR: 3.13
Prothrombin Time: 32.9 seconds — ABNORMAL HIGH (ref 11.4–15.2)

## 2016-07-05 MED ORDER — CARVEDILOL 12.5 MG PO TABS
12.5000 mg | ORAL_TABLET | Freq: Two times a day (BID) | ORAL | Status: DC
  Administered 2016-07-05: 12.5 mg via ORAL
  Filled 2016-07-05: qty 1

## 2016-07-05 MED ORDER — AMOXICILLIN-POT CLAVULANATE 250-62.5 MG/5ML PO SUSR: 500.0000 mg | Freq: Three times a day (TID) | ORAL | 0 refills | Status: AC

## 2016-07-05 MED ORDER — OMEPRAZOLE 2 MG/ML ORAL SUSPENSION: 40.0000 mg | Freq: Every day | ORAL | 3 refills | Status: AC

## 2016-07-05 NOTE — Progress Notes (Addendum)
O2 recheck 99% RA, One Rx given upon discharge

## 2016-07-05 NOTE — Clinical Social Work Placement (Signed)
   CLINICAL SOCIAL WORK PLACEMENT  NOTE  Date:  07/05/2016  Patient Details  Name: Jeremy Gordon MRN: [id number] Date of Birth: 08/16/1940  Clinical Social Work is seeking post-discharge placement for this patient at the [place] level of care (*CSW will initial, date and re-position this form in  chart as items are completed):  Yes   Patient/family provided with [facility] Work Department's list of facilities offering this level of care within the geographic area requested by the patient (or if unable, by the patient's family).  Yes   Patient/family informed of their freedom to choose among providers that offer the needed level of care, that participate in Medicare, Medicaid or managed care program needed by the patient, have an available bed and are willing to accept the patient.  Yes   Patient/family informed of [facility]'s ownership interest in [facility] and [facility], as well as of the fact that they are under no obligation to receive care at these facilities.  PASRR submitted to EDS on       PASRR number received on       Existing PASRR number confirmed on 07/05/16     FL2 transmitted to all facilities in geographic area requested by pt/family on 07/05/16     FL2 transmitted to all facilities within larger geographic area on       Patient informed that his/her managed care company has contracts with or will negotiate with certain facilities, including the following:            Patient/family informed of bed offers received.  Patient chooses bed at       Physician recommends and patient chooses bed at      Patient to be transferred to   on  .  Patient to be transferred to facility by       Patient family notified on   of transfer.  Name of family member notified:        PHYSICIAN       Additional Comment:    _______________________________________________ Danie Chandler, [place] Work 07/05/2016, 8:57  AM

## 2016-07-05 NOTE — Care Management Note (Addendum)
Case Management Note  Patient Details  Name: ELCHONON MAXSON MRN: [id number] Date of Birth: 24-Jul-1940  Subjective/Objective:  Met with patient to discuss discharge planning. He is agreeable to home health and has no agency preference.  TC to son [name], who patient lives with [phone]). He is also agreeable to POC. Referral to [facility] for nursing, PT, HHA and SW. Updated [facility] on concerns that patient needs snf and home environment is in question. Palliative care consult sent to Flo Shanks.                   Action/Plan:   Expected Discharge Date:  07/05/16               Expected Discharge Plan:  [place]  In-House Referral:     Discharge planning Services  CM Consult  Post Acute Care Choice:  Home Health Choice offered to:  Patient, Adult Children  DME Arranged:    DME Agency:     HH Arranged:  RN, PT, Nurse's Aide, Social Work [employer] Agency:  [employer] (now [facility])  Status of Service:  Completed, signed off  If discussed at [employer], dates discussed:    Additional Comments:  Jolly Mango, RN 07/05/2016, 3:30 PM

## 2016-07-05 NOTE — Progress Notes (Signed)
New referral for Home Palliative services received from [facility]. Referral made aware. Patient discharged today. Flo Shanks RN, BSN, [place] [facility] [place], [facility] [phone] c

## 2016-07-05 NOTE — Progress Notes (Addendum)
    Asked to risk stratify patient for possible PEG tube placement. Patient currently ambulating with PT. Discussed with patient in detail after working with PT. Explained to patient his cardiac risks and calculations. He reports after knowing the below he would not want to proceed with PEG tube. I will defer further discussion of this decision to IM and Palliative care. Rrecommend Palliative Care evaluation to discuss this with him.    PREOPERATIVE CARDIAC RISK ASSESSMENT:   Revised Cardiac Risk Index:  High Risk Surgery (defined as Intraperitoneal, intrathoracic or suprainguinal vascular): yes; (possible PEG tube placement)  Active CAD: no  CHF: yes  Cerebrovascular Disease: no   Diabetes: no; On Insulin: no  CKD (Cr >~ 2): no   Estimated Risk of Adverse Outcome: high risk (calculated as moderate risk but cannot evaluate EF given comarobities) for high risk surgery. Estimated Risk of MI, PE, VF/VT (Cardiac Arrest), Complete Heart Block: at least 6.6%   ACC/AHA Guidelines for "Clearance":  Step 1 - Need for Emergency Surgery: no  If Yes - go straight to OR with perioperative surveillance  Step 2 - Active Cardiac Conditions (Unstable Angina, Decompensated HF, Significant  Arrhytmias - Complete HB, Mobitz II, Symptomatic VT or SVT, Severe Aortic Stenosis - mean gradient > 40 mmHg, Valve area < 1.0 cm2): no   If Yes - Evaluate & Treat per ACC/AHA Guidelines  Step 3 -  Low Risk Surgery: no  If Yes --> proceed to OR  If No --> Step 4  Step 4 - Functional Capacity >= 4 METS without symptoms: no  If Yes --> proceed to OR  If No --> Step 5  Step 5 --  Clinical Risk Factors (CRF)  - 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

## 2016-07-05 NOTE — Discharge Summary (Signed)
[facility] [place] NAME: Jeremy Gordon    MR#:  [id number]  DATE OF BIRTH:  [birth year]  DATE OF ADMISSION:  07/01/2016 ADMITTING PHYSICIAN: Henreitta Leber, MD  DATE OF DISCHARGE: 07/05/2016  2:45 PM  PRIMARY CARE PHYSICIAN: [name], MD     ADMISSION DIAGNOSIS:  Shortness of breath [R06.02] Lactic acidosis [E87.2] Weakness [R53.1] Hypotension, unspecified hypotension type [I95.9] Community acquired pneumonia of right upper lobe of lung ([place]) [J18.1]  DISCHARGE DIAGNOSIS:  Principal Problem:   Aspiration pneumonitis ([place]) Active Problems:   Esophageal cancer ([place])   Failure to thrive in adult   Hyponatremia   Elevated troponin   Lactic acidosis   Anemia   Chronic systolic heart failure (HCC)   Alcoholic cardiomyopathy ([place])   SECONDARY DIAGNOSIS:   Past Medical History:  Diagnosis Date  . Arthritis   . Atrial fibrillation ([place])   . COPD (chronic obstructive pulmonary disease) ([place])   . Dysrhythmia   . Esophageal cancer ([place])   . Esophageal carcinoma ([place])    a. Chemo: FOLFIRI  . Essential hypertension   . GERD (gastroesophageal reflux disease)   . History of stress test    a. 06/2003 MV: EF 56%, no ischemia/infarct.  . Persistent atrial fibrillation ([place])    a. 07/2013 Echo: EF 60-65%, mod dil LA, mild TR;  CHA2DS2VASc = 3-->chronic coumadin.  . Sleep apnea     .pro HOSPITAL COURSE:  The patient is 76 year old Caucasian male with past medical history significant for history of recent admission for pneumonia, after which patient was discharged to skilled nursing facility and discharge back home about 2 days ago, who presents back to the hospital with complaints of poor oral intake, nausea, vomiting, epigastric abdominal pain. Chest x-ray revealed recurrent pneumonia and he was admitted. His troponin was found to be elevated 0.04-0.05. His sodium was found to be low as well, felt to be dehydration  related. No white blood cell count elevation, but mild anemia. No bleeding was noted. Lactic acid level was found to be elevated at 2.7, improved with IV fluid administration. Patient was initiated on broad-spectrum antibiotic therapy for pneumonia. Due to concerns of aspiration pneumonitis, patient underwent barium swallowing study, which revealed significant distal esophageal narrowing, near obstruction. It was felt that patient cannot and will not be able to eat solid diet, he was recommended to continue full liquids. He was seen by oncologist, who recommended PEG tube placement, which patient refused, oncologist did not recommend any further oncologic treatment. Palliative care consultation was recommended.  The patient was seen by cardiologist, who recommended to continue beta blockers, discontinue digoxin. Cardiologist felt that patient's mild elevation of troponin due to demand ischemia due to pneumonia, sepsis, hypotension. It was felt the patient was poor candidate for invasive therapy, it was felt that patient is at high risk for any procedures. Since patient refused PEG tube placement, it was felt that patient should return back home with palliative care follow-up. He was ordered to have home health services at home, however, he was felt to be best served by  hospice follow-up at home. Discussion by problem: #1. Right upper lobe  aspiration pneumonitis, continue  liquid Augmentin for 5 more days to complete course, sputum culture is pending, MRSA PCR was negative, blood cultures were negative 4 days, patient is to continue oxygen therapy, nebulizers, clinically improved . Continues to be followed by palliative care as outpatient and possibly hospice care at  home. #2. Atrial fibrillation, rate controlled. Continue, Coreg, Coumadin therapy . The patient was seen by cardiologist, who recommended to discontinue digoxin. #3. Epigastric, mid chest pain, patient had EGD in November 2017, revealing friable  esophagus, he has known distal esophageal cancer, barium swallowing study revealed severe narrowing of the distal esophagus, near obstruction ,  continue  Protonix,  full liquid diet,  patient refused PEG tube placement again. Palliative care consultation is requested, however, not accomplished in the hospital, recommended palliative care. Follow up as outpatient. Patient would be best served by hospice follow-up at home # 4. Hyponatremia,  improved with IV fluids, follow sodium level intermittently as outpatient, patient will not be able to eat solid diet at all, he is to continue full liquids as tolerated, watching for aspiration  #5 elevated troponin, echocardiogram in September 2017 revealed ejection fraction of 35%, cardiologist saw the patient in consultation, recommended to continue  Coreg,  unable to use ACE inhibitor due to hypotension #6. Lactic acidosis, resolved on IV fluids, now discontinued  #7. Anemia, no bleeding noted, stable hemoglobin level, patient is on Coumadin therapy, following INR as outpatient, it was supratherapeutic  in the hospital, was felt to be due to antibiotic therapy  #8 failure to thrive, adult, continue  full liquid diet. Patient has known esophageal cancer,  no oncologic interventions were recommended by oncologist, but PEG tube,  which patient refused, barium swallowing study revealed significant narrowing, near obstruction, patient was explained that he can not eat any solid diet from now on, he is to continue full liquid diet as long as he can. As above, he refused PEG tube placement, more over, cardiologist felt that patient is high risk for procedure  #9 odynophagia, due to esophageal cancer, pre-obstructive esophageal dilatation , supportive therapy with carafate, protonix,  Viscous Lidocaine was provided while in the hospital. Patient was advised to drink liquids very slowly to help to alleviate discomfort in the chest and abdomen   DISCHARGE CONDITIONS:    Poor  CONSULTS OBTAINED:  Treatment Team:  Wende Bushy, MD Lloyd Huger, MD Cammie Sickle, MD  DRUG ALLERGIES:  No Known Allergies  DISCHARGE MEDICATIONS:   Discharge Medication List as of 07/05/2016 12:29 PM    START taking these medications   Details  amoxicillin-clavulanate (AUGMENTIN) 250-62.5 MG/5ML suspension Take 10 mLs (500 mg total) by mouth 3 (three) times daily., Starting Tue 07/05/2016, Normal    omeprazole (PRILOSEC) 2 mg/mL SUSP Take 20 mLs (40 mg total) by mouth daily., Starting Tue 07/05/2016, Normal      CONTINUE these medications which have NOT CHANGED   Details  albuterol (PROVENTIL) (2.5 MG/3ML) 0.083% nebulizer solution Inhale 3 mLs into the lungs every 6 (six) hours., Starting Thu 06/09/2016, Print    HYDROcodone-acetaminophen (NORCO) 10-325 MG tablet Take 1 tablet by mouth every 8 (eight) hours as needed., Historical Med    ondansetron (ZOFRAN) 4 MG tablet Take 1 tablet (4 mg total) by mouth every 8 (eight) hours as needed for nausea or vomiting., Starting Fri 05/06/2016, Print    prochlorperazine (COMPAZINE) 10 MG tablet Take 1 tablet (10 mg total) by mouth every 6 (six) hours as needed for nausea or vomiting., Starting Fri 11/13/2015, Print    promethazine (PHENERGAN) 25 MG suppository Place 1 suppository (25 mg total) rectally every 6 (six) hours as needed for nausea or vomiting., Starting Mon 05/23/2016, Normal    senna (SENOKOT) 8.6 MG TABS tablet Take 1 tablet (8.6 mg total) by mouth 2 (  two) times daily., Starting Tue 01/19/2016, Print    warfarin (COUMADIN) 4 MG tablet Take 2 tablets by mouth with evening meal, Historical Med    finasteride (PROSCAR) 5 MG tablet Take 5 mg by mouth daily., Historical Med    fluticasone (FLONASE) 50 MCG/ACT nasal spray ONE SPRAY IN EACH NOSTRIL AT NIGHT AS DIRECTED, Historical Med    fluticasone (FLOVENT HFA) 220 MCG/ACT inhaler Inhale into the lungs., Starting Fri 08/28/2015, Until Sat 08/27/2016,  Historical Med    gabapentin (NEURONTIN) 300 MG capsule Take 300 mg by mouth 2 (two) times daily. , Starting Tue 07/07/2015, Historical Med    metoprolol (LOPRESSOR) 50 MG tablet Take 50 mg by mouth 2 (two) times daily. , Starting Thu 07/30/2015, Until Fri 07/29/2016, Historical Med    morphine (ROXANOL) 20 MG/ML concentrated solution Take 0.5 mLs (10 mg total) by mouth every 6 (six) hours as needed for severe pain., Starting Thu 06/09/2016, Print    tamsulosin (FLOMAX) 0.4 MG CAPS capsule Take 0.4 mg by mouth daily., Starting Tue 08/25/2015, Historical Med      STOP taking these medications     diltiazem (CARDIZEM CD) 180 MG 24 hr capsule      enalapril (VASOTEC) 20 MG tablet      digoxin (LANOXIN) 0.125 MG tablet      doxycycline (VIBRA-TABS) 100 MG tablet      omeprazole (PRILOSEC) 20 MG capsule      predniSONE (DELTASONE) 5 MG tablet          DISCHARGE INSTRUCTIONS:   Patient is to follow-up with palliative care, primary care physician within one week after discharge  If you experience worsening of your admission symptoms, develop shortness of breath, life threatening emergency, suicidal or homicidal thoughts you must seek medical attention immediately by calling 911 or calling your MD immediately  if symptoms less severe.  You Must read complete instructions/literature along with all the possible adverse reactions/side effects for all the Medicines you take and that have been prescribed to you. Take any new Medicines after you have completely understood and accept all the possible adverse reactions/side effects.   Please note  You were cared for by a hospitalist during your hospital stay. If you have any questions about your discharge medications or the care you received while you were in the hospital after you are discharged, you can call the unit and asked to speak with the hospitalist on call if the hospitalist that took care of you is not available. Once you are discharged,  your primary care physician will handle any further medical issues. Please note that NO REFILLS for any discharge medications will be authorized once you are discharged, as it is imperative that you return to your primary care physician (or establish a relationship with a primary care physician if you do not have one) for your aftercare needs so that they can reassess your need for medications and monitor your lab values.    Today   CHIEF COMPLAINT:   Chief Complaint  Patient presents with  . Shortness of Breath    HISTORY OF PRESENT ILLNESS:  Jeremy Gordon  is a 76 y.o. male with a known history of recent admission for pneumonia, after which patient was discharged to skilled nursing facility and discharge back home about 2 days ago, who presents back to the hospital with complaints of poor oral intake, nausea, vomiting, epigastric abdominal pain. Chest x-ray revealed recurrent pneumonia and he was admitted. His troponin was found to be  elevated 0.04-0.05. His sodium was found to be low as well, felt to be dehydration related. No white blood cell count elevation, but mild anemia. No bleeding was noted. Lactic acid level was found to be elevated at 2.7, improved with IV fluid administration. Patient was initiated on broad-spectrum antibiotic therapy for pneumonia. Due to concerns of aspiration pneumonitis, patient underwent barium swallowing study, which revealed significant distal esophageal narrowing, near obstruction. It was felt that patient cannot and will not be able to eat solid diet, he was recommended to continue full liquids. He was seen by oncologist, who recommended PEG tube placement, which patient refused, oncologist did not recommend any further oncologic treatment. Palliative care consultation was recommended.  The patient was seen by cardiologist, who recommended to continue beta blockers, discontinue digoxin. Cardiologist felt that patient's mild elevation of troponin due to demand  ischemia due to pneumonia, sepsis, hypotension. It was felt the patient was poor candidate for invasive therapy, it was felt that patient is at high risk for any procedures. Since patient refused PEG tube placement, it was felt that patient should return back home with palliative care follow-up. He was ordered to have home health services at home, however, he was felt to be best served by  hospice follow-up at home. Discussion by problem: #1. Right upper lobe  aspiration pneumonitis, continue  liquid Augmentin for 5 more days to complete course, sputum culture is pending, MRSA PCR was negative, blood cultures were negative 4 days, patient is to continue oxygen therapy, nebulizers, clinically improved . Continues to be followed by palliative care as outpatient and possibly hospice care at home. #2. Atrial fibrillation, rate controlled. Continue, Coreg, Coumadin therapy . The patient was seen by cardiologist, who recommended to discontinue digoxin. #3. Epigastric, mid chest pain, patient had EGD in November 2017, revealing friable esophagus, he has known distal esophageal cancer, barium swallowing study revealed severe narrowing of the distal esophagus, near obstruction ,  continue  Protonix,  full liquid diet,  patient refused PEG tube placement again. Palliative care consultation is requested, however, not accomplished in the hospital, recommended palliative care. Follow up as outpatient. Patient would be best served by hospice follow-up at home # 4. Hyponatremia,  improved with IV fluids, follow sodium level intermittently as outpatient, patient will not be able to eat solid diet at all, he is to continue full liquids as tolerated, watching for aspiration  #5 elevated troponin, echocardiogram in September 2017 revealed ejection fraction of 35%, cardiologist saw the patient in consultation, recommended to continue  Coreg,  unable to use ACE inhibitor due to hypotension #6. Lactic acidosis, resolved on IV  fluids, now discontinued  #7. Anemia, no bleeding noted, stable hemoglobin level, patient is on Coumadin therapy, following INR as outpatient, it was supratherapeutic  in the hospital, was felt to be due to antibiotic therapy  #8 failure to thrive, adult, continue  full liquid diet. Patient has known esophageal cancer,  no oncologic interventions were recommended by oncologist, but PEG tube,  which patient refused, barium swallowing study revealed significant narrowing, near obstruction, patient was explained that he can not eat any solid diet from now on, he is to continue full liquid diet as long as he can. As above, he refused PEG tube placement, more over, cardiologist felt that patient is high risk for procedure  #9 odynophagia, due to esophageal cancer, pre-obstructive esophageal dilatation , supportive therapy with carafate, protonix,  Viscous Lidocaine was provided while in the hospital. Patient was advised to  drink liquids very slowly to help to alleviate discomfort in the chest and abdomen     VITAL SIGNS:  Blood pressure 119/76, pulse 86, temperature 97.6 F (36.4 C), temperature source Oral, resp. rate 16, height 5\' 8"  (1.727 m), weight 83.9 kg (185 lb), SpO2 96 %.  I/O:   Intake/Output Summary (Last 24 hours) at 07/05/16 1642 Last data filed at 07/05/16 1343  Gross per 24 hour  Intake             1090 ml  Output             1355 ml  Net             -265 ml    PHYSICAL EXAMINATION:  GENERAL:  76 y.o.-year-old patient lying in the bed with no acute distress.  EYES: Pupils equal, round, reactive to light and accommodation. No scleral icterus. Extraocular muscles intact.  HEENT: Head atraumatic, normocephalic. Oropharynx and nasopharynx clear.  NECK:  Supple, no jugular venous distention. No thyroid enlargement, no tenderness.  LUNGS: Normal breath sounds bilaterally, no wheezing, rales,rhonchi or crepitation. No use of accessory muscles of respiration.  CARDIOVASCULAR: S1, S2  normal. No murmurs, rubs, or gallops.  ABDOMEN: Soft, non-tender, non-distended. Bowel sounds present. No organomegaly or mass.  EXTREMITIES: No pedal edema, cyanosis, or clubbing.  NEUROLOGIC: Cranial nerves II through XII are intact. Muscle strength 5/5 in all extremities. Sensation intact. Gait not checked.  PSYCHIATRIC: The patient is alert and oriented x 3.  SKIN: No obvious rash, lesion, or ulcer.   DATA REVIEW:   CBC  Recent Labs Lab 07/05/16 0424  WBC 2.9*  HGB 11.5*  HCT 32.9*  PLT 135*    Chemistries   Recent Labs Lab 07/03/16 0333 07/04/16 0351  NA 139  --   K 3.3* 3.7  CL 108  --   CO2 28  --   GLUCOSE 89  --   BUN 15  --   CREATININE 0.84  --   CALCIUM 8.0*  --     Cardiac Enzymes  Recent Labs Lab 07/02/16 0519  TROPONINI 0.05*    Microbiology Results  Results for orders placed or performed during the hospital encounter of 07/01/16  Blood culture (routine x 2)     Status: None (Preliminary result)   Collection Time: 07/01/16  6:45 PM  Result Value Ref Range Status   Specimen Description Peripheral  Final   Special Requests NONE  Final   Culture NO GROWTH 4 DAYS  Final   Report Status PENDING  Incomplete  Blood culture (routine x 2)     Status: None (Preliminary result)   Collection Time: 07/01/16  6:45 PM  Result Value Ref Range Status   Specimen Description Peripheral  Final   Special Requests NONE  Final   Culture NO GROWTH 4 DAYS  Final   Report Status PENDING  Incomplete  MRSA PCR Screening     Status: None   Collection Time: 07/01/16  9:00 PM  Result Value Ref Range Status   MRSA by PCR NEGATIVE NEGATIVE Final    Comment:        The GeneXpert MRSA Assay (FDA approved for NASAL specimens only), is one component of a comprehensive MRSA colonization surveillance program. It is not intended to diagnose MRSA infection nor to guide or monitor treatment for MRSA infections.     RADIOLOGY:  Dg Esophagus  Result Date:  07/04/2016 CLINICAL DATA:  Dysphagia.  Stomach cancer . EXAM: ESOPHOGRAM/BARIUM  SWALLOW TECHNIQUE: Single contrast examination was performed using thin barium or water soluble. FLUOROSCOPY TIME:  Fluoroscopy Time:  1 minutes 6 seconds. Radiation Exposure Index (if provided by the fluoroscopic device): 28.9 mGy Number of Acquired Spot Images: 7 COMPARISON:  CT 07/01/2016. FINDINGS: Distal esophageal severe irregular narrowing over approximately 8 cm segment. High-grade partial obstruction. These findings are worrisome for malignant high-grade partial obstruction of the distal esophagus. IMPRESSION: Distal esophageal severe irregular narrowing with near obstruction. This involves approximately 8 cm of the distal most portion of the esophagus. Findings are worrisome for esophageal malignancy. These results will be called to the ordering clinician or representative by the Radiologist Assistant, and communication documented in the PACS or zVision Dashboard. Electronically Signed   By: Marcello Moores  Register   On: 07/04/2016 13:22    EKG:   Orders placed or performed during the hospital encounter of 07/01/16  . ED EKG within 10 minutes  . ED EKG within 10 minutes      Management plans discussed with the patient, family and they are in agreement.  CODE STATUS:     Code Status Orders        Start     Ordered   07/01/16 2054  Full code  Continuous     07/01/16 2053    Code Status History    Date Active Date Inactive Code Status Order ID Comments User Context   06/07/2016  6:02 PM 06/09/2016  6:46 PM Full Code [id number]  Dustin Flock, MD Inpatient   01/17/2016 10:54 AM 01/19/2016  8:02 PM Full Code [id number]  Earnestine Leys, MD Inpatient   01/15/2016  3:36 PM 01/15/2016 10:25 PM Full Code [id number]  Demetrios Loll, MD Inpatient   01/26/2015  6:21 PM 02/02/2015  6:18 PM Full Code [id number]  Vaughan Basta, MD ED      TOTAL TIME TAKING CARE OF THIS PATIENT: 40 minutes.    Theodoro Grist M.D on  07/05/2016 at 4:42 PM  Between 7am to 6pm - Pager - [phone]  After 6pm go to [email or web address] - password [email or web address] [facility]  Office  [phone]  CC: Primary care physician; [name], MD

## 2016-07-05 NOTE — Progress Notes (Signed)
Patient discharge summary reviewed with understanding. O2 reading 99% on RA.

## 2016-07-05 NOTE — NC FL2 (Signed)
[place] LEVEL OF CARE SCREENING TOOL     IDENTIFICATION  Patient Name: Jeremy Gordon Birthdate: 01-08-1941 Sex: male Admission Date (Current Location): 07/01/2016  [facility] and [place] Number:  [name]  ([id number]) Facility and Address:  [facility], [address], [place], [place] [address]      Provider Number: [id number]  Attending Physician Name and Address:  Theodoro Grist, MD  Relative Name and Phone Number:       Current Level of Care: Hospital Recommended Level of Care: [place] Prior Approval Number:    Date Approved/Denied:   PASRR Number:  ([id number])  Discharge Plan: SNF    Current Diagnoses: Patient Active Problem List   Diagnosis Date Noted  . Chronic systolic heart failure ([place])   . Weakness 06/07/2016  . Goals of care, counseling/discussion 05/16/2016  . Closed right hip fracture ([place]) 01/15/2016  . Malignant neoplasm of lower third of esophagus ([place]) 09/10/2015  . Pneumonitis due to inhalation of food or vomitus ([place]) 08/24/2015  . Persistent atrial fibrillation ([place]) 01/29/2015  . Ventricular tachycardia ([place]) 01/29/2015  . Sepsis ([place]) 01/29/2015  . Essential hypertension 01/29/2015  . Hypokalemia 01/29/2015  . Hypomagnesemia 01/29/2015  . Systemic infection ([place]) 01/29/2015  . Atrial fibrillation ([place])   . Aspiration pneumonia of right lower lobe ([place])   . Pneumonia 01/26/2015  . PNA (pneumonia) 01/26/2015  . Chest wall pain 10/08/2014  . Chest pain 10/08/2014  . Dysphagia 02/05/2014  . Apnea, sleep 10/09/2013  . Carpal tunnel syndrome 03/11/2013  . Sciatica 11/23/2012  . Acid reflux 11/23/2012  . Gastro-esophageal reflux disease without esophagitis 11/23/2012  . 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 Enlarged prostate 10/15/2012  . Allergic rhinitis 08/15/2012  . Back ache 08/15/2012  . Atrial fibrillation, chronic ([place]) 08/15/2012  . CAFL (chronic airflow limitation)  ([place]) 08/15/2012  . Breathlessness on exertion 08/15/2012  . BP (high blood pressure) 08/15/2012  . Malaise and fatigue 08/15/2012  . 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 Arthritis of knee, degenerative 08/15/2012  . 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 Lumbar canal stenosis 08/15/2012  . Chronic obstructive pulmonary disease ([place]) 08/15/2012  . Dorsalgia 08/15/2012  . Essential (primary) hypertension 08/15/2012  . Breath shortness 08/15/2012  . Does not feel right 08/15/2012  . Post viral syndrome 08/15/2012  . 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 )  (None. ) Continent Diet (Diet: Clear Liquid)  AMBULATORY STATUS COMMUNICATION OF NEEDS Skin   Limited Assist Verbally Normal                       Personal Care Assistance Level of Assistance  Bathing, Feeding, Dressing Bathing Assistance: Limited assistance Feeding assistance: Independent Dressing Assistance: Limited assistance     Functional Limitations Info  Sight, Hearing, Speech Sight Info: Adequate Hearing Info: Adequate Speech Info: Impaired    SPECIAL CARE FACTORS FREQUENCY  PT (By licensed PT), OT (By licensed OT)     PT Frequency:  (5) OT Frequency:  (5)            Contractures      Additional Factors Info  Code Status, Allergies Code Status Info:  (Full Code) Allergies  Info:  (No Known Allergies)           Current Medications (07/05/2016):  This is the current hospital active medication list Current Facility-Administered Medications  Medication Dose Route Frequency Provider Last Rate Last Dose  . acetaminophen (TYLENOL) tablet 650 mg  650 mg Oral Q6H PRN Henreitta Leber, MD       Or  . acetaminophen  (TYLENOL) suppository 650 mg  650 mg Rectal Q6H PRN Henreitta Leber, MD      . albuterol (PROVENTIL) (2.5 MG/3ML) 0.083% nebulizer solution 2.5 mg  2.5 mg Nebulization Q6H PRN Henreitta Leber, MD      . albuterol (PROVENTIL) (2.5 MG/3ML) 0.083% nebulizer solution 3 mL  3 mL Inhalation TID Henreitta Leber, MD   3 mL at 07/04/16 1937  . budesonide (PULMICORT) nebulizer solution 0.25 mg  0.25 mg Nebulization BID Henreitta Leber, MD   0.25 mg at 07/04/16 1937  . carvedilol (COREG) tablet 12.5 mg  12.5 mg Oral BID WC Theodoro Grist, MD      . finasteride (PROSCAR) tablet 5 mg  5 mg Oral Daily Henreitta Leber, MD   5 mg at 07/03/16 0902  . gabapentin (NEURONTIN) capsule 300 mg  300 mg Oral BID Henreitta Leber, MD   300 mg at 07/04/16 2100  . lidocaine (XYLOCAINE) 2 % viscous mouth solution 15 mL  15 mL Mouth/Throat Q4H PRN Theodoro Grist, MD      . morphine CONCENTRATE 10 MG/0.5ML oral solution 10 mg  10 mg Oral Q6H PRN Henreitta Leber, MD      . ondansetron (ZOFRAN) tablet 4 mg  4 mg Oral Q6H PRN Henreitta Leber, MD       Or  . ondansetron (ZOFRAN) injection 4 mg  4 mg Intravenous Q6H PRN Henreitta Leber, MD   4 mg at 07/04/16 1709  . oxyCODONE (ROXICODONE INTENSOL) 20 MG/ML concentrated solution 10 mg  10 mg Oral Q4H PRN Theodoro Grist, MD      . pantoprazole (PROTONIX) EC tablet 40 mg  40 mg Oral Daily Henreitta Leber, MD   40 mg at 07/03/16 0902  . piperacillin-tazobactam (ZOSYN) IVPB 3.375 g  3.375 g Intravenous Q8H Sheema M Hallaji, RPH   3.375 g at 07/05/16 0550  . polyethylene glycol (MIRALAX / GLYCOLAX) packet 17 g  17 g Oral Daily Theodoro Grist, MD   17 g at 07/03/16 0924  . senna (SENOKOT) tablet 8.6 mg  1 tablet Oral BID Henreitta Leber, MD   8.6 mg at 07/04/16 2100  . sodium chloride flush (NS) 0.9 % injection 3 mL  3 mL Intravenous Q12H Henreitta Leber, MD   3 mL at 07/04/16 2100  . sucralfate (CARAFATE) 1 GM/10ML suspension 1 g  1 g Oral Q6H Theodoro Grist, MD   1 g at 07/05/16 0355  .  tamsulosin (FLOMAX) capsule 0.4 mg  0.4 mg Oral Daily Henreitta Leber, MD   0.4 mg at 07/03/16 0902  . Warfarin - Pharmacist Dosing Inpatient   Does not apply q1800 Sheema M Hallaji, RPH       Facility-Administered Medications Ordered in Other Encounters  Medication Dose Route Frequency Provider Last Rate Last Dose  . sodium chloride 0.9 % injection 10 mL  10 mL Intravenous PRN Lloyd Huger, MD   10 mL at 12/23/14 0945  . sodium chloride 0.9 % injection 10 mL  10 mL Intravenous PRN Lloyd Huger, MD  10 mL at 01/15/15 1512  . sodium chloride flush (NS) 0.9 % injection 10 mL  10 mL Intravenous PRN Lloyd Huger, MD   10 mL at 11/10/15 8979     Discharge Medications: Please see discharge summary for a list of discharge medications.  Relevant Imaging Results:  Relevant Lab Results:   Additional Information  (SSN: [id number])  Danie Chandler, Student-Social Work

## 2016-07-05 NOTE — Progress Notes (Signed)
ANTICOAGULATION CONSULT NOTE -   Pharmacy Consult for warfarin dosing  Indication: atrial fibrillation  No Known Allergies  Patient Measurements: Height: 5\' 8"  (172.7 cm) Weight: 185 lb (83.9 kg) IBW/kg (Calculated) : 68.4  Vital Signs: Temp: 97.6 F (36.4 C) (03/20 0726) Temp Source: Oral (03/20 0726) BP: 119/76 (03/20 0726) Pulse Rate: 86 (03/20 0726)  Labs:  Recent Labs  07/03/16 0333 07/04/16 0351 07/05/16 0424  HGB 10.9* 11.1* 11.5*  HCT 31.5* 32.5* 32.9*  PLT 111* 124* 135*  LABPROT 34.4* 37.5* 32.9*  INR 3.31 3.69 3.13  CREATININE 0.84  --   --     Estimated Creatinine Clearance: 76.5 mL/min (by C-G formula based on SCr of 0.84 mg/dL).   Medical History: Past Medical History:  Diagnosis Date  . Arthritis   . Atrial fibrillation ([place])   . COPD (chronic obstructive pulmonary disease) ([place])   . Dysrhythmia   . Esophageal cancer ([place])   . Esophageal carcinoma ([place])    a. Chemo: FOLFIRI  . Essential hypertension   . GERD (gastroesophageal reflux disease)   . History of stress test    a. 06/2003 MV: EF 56%, no ischemia/infarct.  . Persistent atrial fibrillation ([place])    a. 07/2013 Echo: EF 60-65%, mod dil LA, mild TR;  CHA2DS2VASc = 3-->chronic coumadin.  . Sleep apnea     Assessment: 76 yo male with PMH of A. FIb,  admitted with PNA. Pharmacy consulted for warfarin dosing and monitoring.   Home Regimen: Warfarin 8 mg daily  DATE  INR DOSE 3/16  3.12 5 mg 3/17  3.04 5 mg 3/18  3.31 3.5 mg 3/19                 3.69     No warfarin 3/20                 3.13  Goal of Therapy:  INR 2-3 Monitor platelets by anticoagulation protocol: Yes   Plan:  Patient still slightly supratherapeutic. Patient is also  receiving antibiotics. Will hold warfarin dose tonight. Will check INRs daily while on Abxs  Paulina Fusi, PharmD, BCPS 07/05/2016 10:06 AM
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
## 2016-07-05 NOTE — Progress Notes (Signed)
Physical Therapy Treatment Patient Details Name: Jeremy Gordon MRN: [id number] DOB: Apr 28, 1940 Today's Date: 07/05/2016    History of Present Illness Pt. is a 76 year old male admitted to [facility] with shortness of breath, rigor and chills. He reports that he was just in a SNF for 2 weeks and had just been home for a day prior to coming back to the hospital.     PT Comments    Overall improved mobility today.  Pt was able to ambulate 40' with walker and min assist x 2. Pt with very narrow BOS with gait with general weakness and unsteadiness.    While pt would benefit from SNF, plan is to return home with family assist.  Pt stated he has a wheelchair at home.  Encouraged not to ambulate/transfer without assist.  He stated his daughter in law is home with him at all times.  He would benefit from HHPT to increase strength and mobility skills.  He stated he is comfortable with discharge home.   Follow Up Recommendations  SNF     Equipment Recommendations  Rolling walker with 5" wheels;3in1 (PT)    Recommendations for Other Services       Precautions / Restrictions Precautions Precautions: Fall Restrictions Weight Bearing Restrictions: No    Mobility  Bed Mobility Overal bed mobility: Needs Assistance Bed Mobility: Supine to Sit     Supine to sit: Min guard     General bed mobility comments: with rail  Transfers Overall transfer level: Modified independent Equipment used: Rolling walker (2 wheeled)             General transfer comment: verbal cues for safety  Ambulation/Gait Ambulation/Gait assistance: Min assist;+2 safety/equipment Ambulation Distance (Feet): 40 Feet Assistive device: Rolling walker (2 wheeled) Gait Pattern/deviations: Trunk flexed Gait velocity: very narrow BOS, vc's to increase.  Generally unsteady with high fall risk.   Gait velocity interpretation: Below normal speed for age/gender General Gait Details: Needs cueing for safety prior to  sitting   Stairs            Wheelchair Mobility    Modified Rankin (Stroke Patients Only)       Balance Overall balance assessment: Needs assistance Sitting-balance support: Bilateral upper extremity supported;Single extremity supported Sitting balance-Leahy Scale: Fair     Standing balance support: Bilateral upper extremity supported Standing balance-Leahy Scale: Fair Standing balance comment: assistance needed for safety                    Cognition Arousal/Alertness: Awake/alert Behavior During Therapy: WFL for tasks assessed/performed Overall Cognitive Status: Within Functional Limits for tasks assessed                      Exercises      General Comments        Pertinent Vitals/Pain Pain Assessment: No/denies pain    Home Living                      Prior Function            PT Goals (current goals can now be found in the care plan section) Progress towards PT goals: Progressing toward goals    Frequency    Min 2X/week      PT Plan Current plan remains appropriate    Co-evaluation             End of Session Equipment Utilized During Treatment: Gait belt;Oxygen Activity Tolerance:  Patient tolerated treatment well;Patient limited by fatigue Patient left: in chair;with chair alarm set;with call bell/phone within reach         Time: 0927-0950 PT Time Calculation (min) (ACUTE ONLY): 23 min  Charges:  $Gait Training: 23-37 mins $Therapeutic Activity: 23-37 mins                    G Codes:      Chesley Noon, PTA 07/05/16, 10:06 AM

## 2016-07-06 LAB — CULTURE, BLOOD (ROUTINE X 2)
CULTURE: NO GROWTH
CULTURE: NO GROWTH

## 2016-07-20 ENCOUNTER — Encounter: Payer: Self-pay | Admitting: *Deleted

## 2016-07-20 ENCOUNTER — Emergency Department
Admission: EM | Admit: 2016-07-20 | Discharge: 2016-07-20 | Disposition: A | Discharge status: Home / Self Care | Payer: Medicare Other | Attending: Emergency Medicine | Admitting: Emergency Medicine

## 2016-07-20 DIAGNOSIS — Z79899 Other long term (current) drug therapy: Secondary | ICD-10-CM | POA: Diagnosis not present

## 2016-07-20 DIAGNOSIS — Z7901 Long term (current) use of anticoagulants: Secondary | ICD-10-CM | POA: Insufficient documentation

## 2016-07-20 DIAGNOSIS — J449 Chronic obstructive pulmonary disease, unspecified: Secondary | ICD-10-CM | POA: Insufficient documentation

## 2016-07-20 DIAGNOSIS — R778 Other specified abnormalities of plasma proteins: Secondary | ICD-10-CM

## 2016-07-20 DIAGNOSIS — R531 Weakness: Secondary | ICD-10-CM

## 2016-07-20 DIAGNOSIS — I5022 Chronic systolic (congestive) heart failure: Secondary | ICD-10-CM | POA: Diagnosis not present

## 2016-07-20 DIAGNOSIS — I482 Chronic atrial fibrillation, unspecified: Secondary | ICD-10-CM

## 2016-07-20 DIAGNOSIS — C159 Malignant neoplasm of esophagus, unspecified: Secondary | ICD-10-CM

## 2016-07-20 DIAGNOSIS — I110 Hypertensive heart disease with heart failure: Secondary | ICD-10-CM | POA: Diagnosis not present

## 2016-07-20 DIAGNOSIS — R7989 Other specified abnormal findings of blood chemistry: Secondary | ICD-10-CM

## 2016-07-20 DIAGNOSIS — F17290 Nicotine dependence, other tobacco product, uncomplicated: Secondary | ICD-10-CM | POA: Insufficient documentation

## 2016-07-20 LAB — TROPONIN I: Troponin I: 0.11 ng/mL (ref <0.03)

## 2016-07-20 LAB — URINALYSIS, COMPLETE (UACMP) WITH MICROSCOPIC
GLUCOSE, UA: NEGATIVE mg/dL
Hgb urine dipstick: NEGATIVE
KETONES UR: 80 mg/dL — AB
Leukocytes, UA: NEGATIVE
NITRITE: NEGATIVE
PH: 5 (ref 5.0–8.0)
Protein, ur: 30 mg/dL — AB
Specific Gravity, Urine: 1.029 (ref 1.005–1.030)
Squamous Epithelial / LPF: NONE SEEN

## 2016-07-20 LAB — CBC
HEMATOCRIT: 41.4 % (ref 40.0–52.0)
HEMOGLOBIN: 14.1 g/dL (ref 13.0–18.0)
MCH: 30.6 pg (ref 26.0–34.0)
MCHC: 34.1 g/dL (ref 32.0–36.0)
MCV: 89.7 fL (ref 80.0–100.0)
Platelets: 204 10*3/uL (ref 150–440)
RBC: 4.62 MIL/uL (ref 4.40–5.90)
RDW: 18.7 % — ABNORMAL HIGH (ref 11.5–14.5)
WBC: 6.9 10*3/uL (ref 3.8–10.6)

## 2016-07-20 LAB — BASIC METABOLIC PANEL
ANION GAP: 12 (ref 5–15)
BUN: 18 mg/dL (ref 6–20)
CHLORIDE: 103 mmol/L (ref 101–111)
CO2: 24 mmol/L (ref 22–32)
Calcium: 8.9 mg/dL (ref 8.9–10.3)
Creatinine, Ser: 0.81 mg/dL (ref 0.61–1.24)
GLUCOSE: 85 mg/dL (ref 65–99)
Potassium: 3.4 mmol/L — ABNORMAL LOW (ref 3.5–5.1)
Sodium: 139 mmol/L (ref 135–145)

## 2016-07-20 MED ORDER — SODIUM CHLORIDE 0.9 % IV BOLUS (SEPSIS)
1000.0000 mL | Freq: Once | INTRAVENOUS | Status: AC
  Administered 2016-07-20: 1000 mL via INTRAVENOUS

## 2016-07-20 NOTE — Care Management Note (Signed)
Case Management Note  Patient Details  Name: Jeremy Gordon MRN: [id number] Date of Birth: 24-Oct-1940  Subjective/Objective:      Contacted [name] at [phone], to make them aware that patient is being discharged home. Is now open to talk about Hospice options that may be available. Patient wants to get back to dying spouse. Md at bedside and aware.              Action/Plan:   Expected Discharge Date:                  Expected Discharge Plan:     In-House Referral:     Discharge planning Services     Post Acute Care Choice:    Choice offered to:     DME Arranged:    DME Agency:     HH Arranged:    HH Agency:     Status of Service:     If discussed at [employer] of Stay Meetings, dates discussed:    Additional Comments:  Beau Fanny, RN 07/20/2016, 2:00 PM

## 2016-07-20 NOTE — Care Management Note (Signed)
Case Management Note  Patient Details  Name: Jeremy Gordon MRN: [id number] Date of Birth: November 20, 1940  Subjective/Objective:          Call from [place] with [facility] , veriifes that the pt. Has SN, PT., and CSW through this agency.          Action/Plan:   Expected Discharge Date:                  Expected Discharge Plan:     In-House Referral:     Discharge planning Services     Post Acute Care Choice:    Choice offered to:     DME Arranged:    DME Agency:     HH Arranged:    HH Agency:     Status of Service:     If discussed at [employer] of Stay Meetings, dates discussed:    Additional Comments:  Beau Fanny, RN 07/20/2016, 1:42 PM

## 2016-07-20 NOTE — Discharge Instructions (Addendum)
You were evaluated for fatigue and found to have racing heartbeat and evidence of likely mild dehydration due to not being able to take down liquids very well on account of the known esophageal cancer and mass.  Your heart enzyme was elevated indicating some heart injury. We discussed whether to admit to the hospital, but you wanted to go home.  We discussed whether to be admitted for possible feeding tube placement, but you did not want this and wanted to go home. We did discuss with [facility], will call him and transfer service to Hospice. He will hear from them at home.  Return to the emergency department immediately for any worsening condition, including trouble breathing, or altered mental status or fevers or chest pain. You may want to consider talking with her hospice nurse first.

## 2016-07-20 NOTE — ED Triage Notes (Signed)
States feeling weak, states hx of esophageal cancer, states he has not been able to keep any food down, vomiting, awake and alert

## 2016-07-20 NOTE — ED Provider Notes (Signed)
[facility] Emergency Department Provider Note ____________________________________________   I have reviewed the triage vital signs and the triage nursing note.  HISTORY  Chief Complaint Weakness   Historian Patient Sister  HPI Jeremy Gordon is a 76 y.o. male with history of Afib, esophogeal cancer, htn, gerd presents for trouble tolerating po intake, liquid diet for near obstruction of the esophageal mass (from reviewing notes).  PCp is at [facility].  Follow here the cancer center.  No chest pain or trouble breathing.  Denies abdominal pain other than wretching.  Patient states that he thinks he needs IV fluids.  States he has a follow up in a day or so.    By note review, recent hospitalization in march, patient had refused peg tube, followed by palliative care.    Past Medical History:  Diagnosis Date  . Arthritis   . Atrial fibrillation ([place])   . COPD (chronic obstructive pulmonary disease) ([place])   . Dysrhythmia   . Esophageal cancer ([place])   . Esophageal carcinoma ([place])    a. Chemo: FOLFIRI  . Essential hypertension   . GERD (gastroesophageal reflux disease)   . History of stress test    a. 06/2003 MV: EF 56%, no ischemia/infarct.  . Persistent atrial fibrillation ([place])    a. 07/2013 Echo: EF 60-65%, mod dil LA, mild TR;  CHA2DS2VASc = 3-->chronic coumadin.  . Sleep apnea     Patient Active Problem List   Diagnosis Date Noted  . Esophageal cancer ([place]) 07/05/2016  . Hyponatremia 07/05/2016  . Elevated troponin 07/05/2016  . Lactic acidosis 07/05/2016  . Anemia 07/05/2016  . Failure to thrive in adult 07/05/2016  . 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 Chronic systolic heart failure ([place])   . Weakness 06/07/2016  . Goals of care, counseling/discussion 05/16/2016  . Closed right hip fracture ([place]) 01/15/2016  . Malignant neoplasm of lower third of esophagus ([place]) 09/10/2015  . Pneumonitis due to inhalation of food or vomitus ([place])  08/24/2015  . Persistent atrial fibrillation ([place]) 01/29/2015  . Ventricular tachycardia ([place]) 01/29/2015  . Sepsis ([place]) 01/29/2015  . Essential hypertension 01/29/2015  . Hypokalemia 01/29/2015  . Hypomagnesemia 01/29/2015  . Systemic infection ([place]) 01/29/2015  . Atrial fibrillation ([place])   . Aspiration pneumonia of right lower lobe ([place])   . Aspiration pneumonitis ([place]) 01/26/2015  . PNA (pneumonia) 01/26/2015  . Chest wall pain 10/08/2014  . Chest pain 10/08/2014  . Dysphagia 02/05/2014  . Apnea, sleep 10/09/2013  . Carpal tunnel syndrome 03/11/2013  . Sciatica 11/23/2012  . Acid reflux 11/23/2012  . Gastro-esophageal reflux disease without esophagitis 11/23/2012  . 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 Enlarged prostate 10/15/2012  . Allergic rhinitis 08/15/2012  . Back ache 08/15/2012  . Atrial fibrillation, chronic ([place]) 08/15/2012  . CAFL (chronic airflow limitation) ([place]) 08/15/2012  . Breathlessness on exertion 08/15/2012  . BP (high blood pressure) 08/15/2012  . Malaise and fatigue 08/15/2012  . 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 Arthritis of knee, degenerative 08/15/2012  . 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 Lumbar canal stenosis 08/15/2012  . Chronic obstructive pulmonary disease ([place]) 08/15/2012  . Dorsalgia 08/15/2012  . Essential (primary) hypertension 08/15/2012  . Breath shortness 08/15/2012  . Does not feel right 08/15/2012  . Post viral syndrome 08/15/2012  . 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 ESOPHAGOGASTRODUODENOSCOPY (EGD) WITH PROPOFOL N/A 08/31/2015   Procedure:  ESOPHAGOGASTRODUODENOSCOPY (EGD) WITH PROPOFOL;  Surgeon: Manya Silvas, MD;  Location: [facility] ENDOSCOPY;  Service: Endoscopy;  Laterality: N/A;  . ESOPHAGOGASTRODUODENOSCOPY (EGD) WITH PROPOFOL N/A 02/26/2016   Procedure: ESOPHAGOGASTRODUODENOSCOPY (EGD) WITH PROPOFOL;  Surgeon:  Manya Silvas, MD;  Location: [facility] ENDOSCOPY;  Service: Endoscopy;  Laterality: N/A;  . FEMUR IM NAIL Right 01/17/2016   Procedure: INTRAMEDULLARY (IM) NAIL FEMORAL;  Surgeon: Earnestine Leys, MD;  Location: [facility] ORS;  Service: Orthopedics;  Laterality: Right;  . HERNIA REPAIR    . PERIPHERAL VASCULAR CATHETERIZATION N/A 11/20/2014   Procedure: Glori Luis Cath Insertion;  Surgeon: Algernon Huxley, MD;  Location: [place] CV LAB;  Service: Cardiovascular;  Laterality: N/A;    Prior to Admission medications   Medication Sig Start Date End Date Taking? Authorizing Provider  albuterol (PROVENTIL) (2.5 MG/3ML) 0.083% nebulizer solution Inhale 3 mLs into the lungs every 6 (six) hours. 06/09/16  Yes Richard Leslye Peer, MD  CARAFATE 1 GM/10ML suspension Take 10 mLs by mouth 2 (two) times daily. 05/11/16  Yes Historical Provider, MD  diltiazem (CARDIZEM CD) 120 MG 24 hr capsule Take 120 mg by mouth daily. 07/16/16  Yes Historical Provider, MD  enalapril (VASOTEC) 20 MG tablet Take 20 mg by mouth 2 (two) times daily. 07/16/16  Yes Historical Provider, MD  finasteride (PROSCAR) 5 MG tablet Take 5 mg by mouth daily.   Yes Historical Provider, MD  fluticasone (FLONASE) 50 MCG/ACT nasal spray ONE SPRAY IN EACH NOSTRIL AT NIGHT AS DIRECTED 07/01/15  Yes Historical Provider, MD  fluticasone (FLOVENT HFA) 220 MCG/ACT inhaler Inhale into the lungs. 08/28/15 08/27/16 Yes Historical Provider, MD  gabapentin (NEURONTIN) 300 MG capsule Take 300 mg by mouth 2 (two) times daily.  07/07/15  Yes Historical Provider, MD  metoprolol tartrate (LOPRESSOR) 25 MG tablet Take 25 mg by mouth 2 (two) times daily.  07/30/15 07/29/16 Yes Historical Provider, MD  morphine (ROXANOL) 20 MG/ML concentrated solution Take 0.5 mLs (10 mg total) by mouth every 6 (six) hours as needed for severe pain. 06/09/16  Yes Loletha Grayer, MD  omeprazole (PRILOSEC) 20 MG capsule Take 20 mg by mouth 2 (two) times daily. 05/09/16  Yes Historical Provider, MD   prochlorperazine (COMPAZINE) 10 MG tablet Take 1 tablet (10 mg total) by mouth every 6 (six) hours as needed for nausea or vomiting. 11/13/15  Yes Lloyd Huger, MD  promethazine (PHENERGAN) 25 MG suppository Place 1 suppository (25 mg total) rectally every 6 (six) hours as needed for nausea or vomiting. 05/23/16  Yes Lloyd Huger, MD  senna (SENOKOT) 8.6 MG TABS tablet Take 1 tablet (8.6 mg total) by mouth 2 (two) times daily. 01/19/16  Yes Demetrios Loll, MD  tamsulosin (FLOMAX) 0.4 MG CAPS capsule Take 0.4 mg by mouth daily. 08/25/15  Yes Historical Provider, MD  warfarin (COUMADIN) 4 MG tablet Take 2 tablets by mouth with evening meal 07/27/15  Yes Historical Provider, MD  amoxicillin-clavulanate (AUGMENTIN) 250-62.5 MG/5ML suspension Take 10 mLs (500 mg total) by mouth 3 (three) times daily. Patient not taking: Reported on 07/20/2016 07/05/16   Theodoro Grist, MD  omeprazole (PRILOSEC) 2 mg/mL SUSP Take 20 mLs (40 mg total) by mouth daily. Patient not taking: Reported on 07/20/2016 07/05/16   Theodoro Grist, MD  ondansetron (ZOFRAN) 4 MG tablet Take 1 tablet (4 mg total) by mouth every 8 (eight) hours as needed for nausea or vomiting. Patient not taking: Reported on 07/20/2016 05/06/16   Lisa Roca, MD    No Known Allergies  Family History  Problem Relation Age of Onset  . Cancer Mother   . Stroke Father   . Hypertension Sister   . Hypertension Brother     Social History Social History  Substance Use Topics  . Smoking status: Current Some Day Smoker    Years: 62.00  . Smokeless tobacco: Current User    Types: Snuff  . Alcohol use No    Review of Systems  Constitutional: Negative for fever. Eyes: Negative for visual changes. ENT: Negative for sore throat. Cardiovascular: Negative for chest pain. Respiratory: Negative for shortness of breath. Gastrointestinal: Positive for vomiting, non bloody and nonbilious. Genitourinary: Negative for dysuria. Musculoskeletal: Negative for back  pain. Skin: Negative for rash. Neurological: Negative for headache. 10 point Review of Systems otherwise negative ____________________________________________   PHYSICAL EXAM:  VITAL SIGNS: ED Triage Vitals  Enc Vitals Group     BP 07/20/16 1100 100/62     Pulse Rate 07/20/16 1100 (!) 121     Resp 07/20/16 1100 18     Temp 07/20/16 1100 97.6 F (36.4 C)     Temp Source 07/20/16 1100 Oral     SpO2 07/20/16 1100 98 %     Weight 07/20/16 1058 185 lb (83.9 kg)     Height 07/20/16 1058 5\' 8"  (1.727 m)     Head Circumference --      Peak Flow --      Pain Score 07/20/16 1057 5     Pain Loc --      Pain Edu? --      Excl. in [place]? --      Constitutional: Alert and oriented. Hard of hearing.  In no distress. HEENT   Head: Normocephalic and atraumatic.      Eyes: Conjunctivae are normal. PERRL. Normal extraocular movements.      Ears:         Nose: No congestion/rhinnorhea.   Mouth/Throat: Mucous membranes are mildly dry.   Neck: No stridor. Cardiovascular/Chest: Irregularly irregular and occasionally tachycardic.  No murmurs, rubs, or gallops. Respiratory: Normal respiratory effort without tachypnea nor retractions. Breath sounds are clear and equal bilaterally. No wheezes/rales/rhonchi. Gastrointestinal: Soft. No distention, no guarding, no rebound. Nontender.  Genitourinary/rectal:Deferred Musculoskeletal: Nontender with normal range of motion in all extremities. No joint effusions.  No lower extremity tenderness.  No edema. Neurologic:  Normal speech and language. No gross or focal neurologic deficits are appreciated. Skin:  Skin is warm, dry and intact. No rash noted. Psychiatric: Mood and affect are normal. Speech and behavior are normal. Patient exhibits appropriate insight and judgment.   ____________________________________________  LABS (pertinent positives/negatives)  Labs Reviewed  BASIC METABOLIC PANEL - Abnormal; Notable for the following:        Result Value   Potassium 3.4 (*)    All other components within normal limits  CBC - Abnormal; Notable for the following:    RDW 18.7 (*)    All other components within normal limits  URINALYSIS, COMPLETE (UACMP) WITH MICROSCOPIC - Abnormal; Notable for the following:    Color, Urine AMBER (*)    APPearance HAZY (*)    Bilirubin Urine SMALL (*)    Ketones, ur 80 (*)    Protein, ur 30 (*)    Bacteria, UA RARE (*)    All other components within normal limits  TROPONIN I - Abnormal; Notable for the following:    Troponin I 0.11 (*)    All other components within normal limits  URINE CULTURE  ____________________________________________    EKG I, Lisa Roca, MD, the attending physician have personally viewed and interpreted all ECGs.  121 bpm.  afib with rapid ventricular response. Occasional pvcs.   Bifascicular block.  Left axis deviation.    Nonspecific st /t wave ____________________________________________  RADIOLOGY All Xrays were viewed by me. Imaging interpreted by Radiologist.  None __________________________________________  PROCEDURES  Procedure(s) performed: None  Critical Care performed: None  ____________________________________________   ED COURSE / ASSESSMENT AND PLAN  Pertinent labs & imaging results that were available during my care of the patient were reviewed by me and considered in my medical decision making (see chart for details).   Mr. San is here not tolerating liquid diet and spitting up/vomiting. He has known near obstruction of the esophagus by mask, and by chart review, patient had refused PEG tube in the last several weeks. He is not complaining of infectious symptoms. He does not have a distended or tender abdomen.  He does have underlying history of chronic A. Fib, heart rate anywhere between 90 and 120. He looks slightly dehydrated. He is going to be given a liter of normal saline.  No acute renal failure or electrolyte  abnormalities.  He has appointment with Cancer center tomorrow.  Troponin elevated 0.11.  Patient feels better after 1 L normal saline. Heart rate spitting most the time in the 90s. Occasionally up to 120.  I discussed frankly with the patient his diagnosis again, and he does understand that he is a terminal with untreatable esophageal cancer. He reiterates that he does not want a feeding tube. He understands that this point he is having trouble passing liquids, and this is pretty critical. In any case, he is still does not want a feeding tube and does not want to be admitted to the hospital. He would like to go home. His wife is apparently on hospice and is not known if she is going to make it through the day.  We discussed his troponin being minimally elevated at 0.11. He's not having chest discomfort. We discussed that he would not be taking any invasive treatments. He does not want to be admitted for this. He understands the risk of heart attack and death with respect to that as well.  Added at the social worker here, Malachy Mood did look into the patient's home health which is [facility], and the patient is interested in hospice, [facility] stated they will arrange for transfer of care to hospice services.   I spoke with patient's daughter-in-law with whom he lives, and we had a frank discussion about the patient's wishes for no feeding tube, no additional treatment, no hospitalization, discharged home with transfer hospice services. Daughter-in-law states this is consistent with his wishes and they are comfortable with having him at home. She's can come pick him up.    CONSULTATIONS:  None  Patient / Family / Caregiver informed of clinical course, medical decision-making process, and agree with plan.   I discussed return precautions, follow-up instructions, and discharge instructions with patient and/or family.   ___________________________________________   FINAL CLINICAL IMPRESSION(S) / ED  DIAGNOSES   Final diagnoses:  Weakness  Chronic a-fib ([place])  Malignant neoplasm of esophagus, unspecified location ([place])  Troponin level elevated              Note: This dictation was prepared with Dragon dictation. Any transcriptional errors that result from this process are unintentional    Lisa Roca, MD 07/20/16 1437

## 2016-07-21 ENCOUNTER — Inpatient Hospital Stay: Payer: Medicare Other | Admitting: Oncology

## 2016-07-21 LAB — URINE CULTURE

## 2016-07-27 ENCOUNTER — Ambulatory Visit: Payer: Medicare Other | Admitting: Radiation Oncology

## 2016-08-03 ENCOUNTER — Telehealth: Payer: Self-pay | Admitting: *Deleted

## 2016-08-03 NOTE — Telephone Encounter (Signed)
They have received 2 orders for hospice services, the patient requests his PCP be his hospice doctor. They are opening him to services today

## 2016-09-16 DEATH — deceased

## 2017-09-01 IMAGING — RF DG ESOPHAGUS
7 series · 8 of 8 positions shown · non-contrast
Comparison: CT 07/01/2016.

CLINICAL DATA: Dysphagia.  Stomach cancer .

EXAM:
ESOPHOGRAM/BARIUM SWALLOW
TECHNIQUE: Single contrast examination was performed using thin barium or water
soluble.
FLUOROSCOPY TIME:  Fluoroscopy Time:  1 minutes 6 seconds.
Radiation Exposure Index (if provided by the fluoroscopic device):
28.9 mGy
Number of Acquired Spot Images: 7

[Series 1: fluoro_barium 2fps_bw · 0.17mm/px · 1 of 1 slices shown (1 of 7)]
[im 1/1]
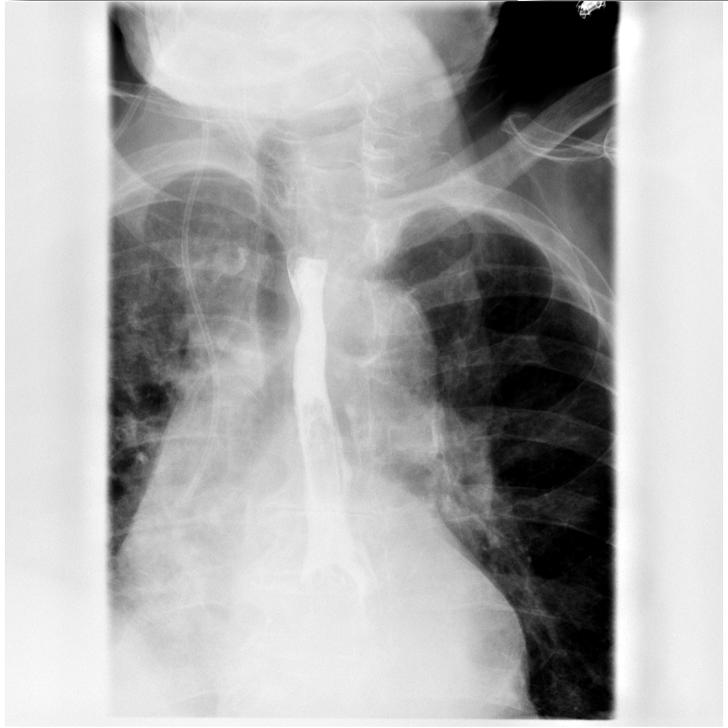

[Series 2: fluoro_barium 2fps_bw · 0.17mm/px · 1 of 1 slices shown (2 of 7)]
[im 1/1]
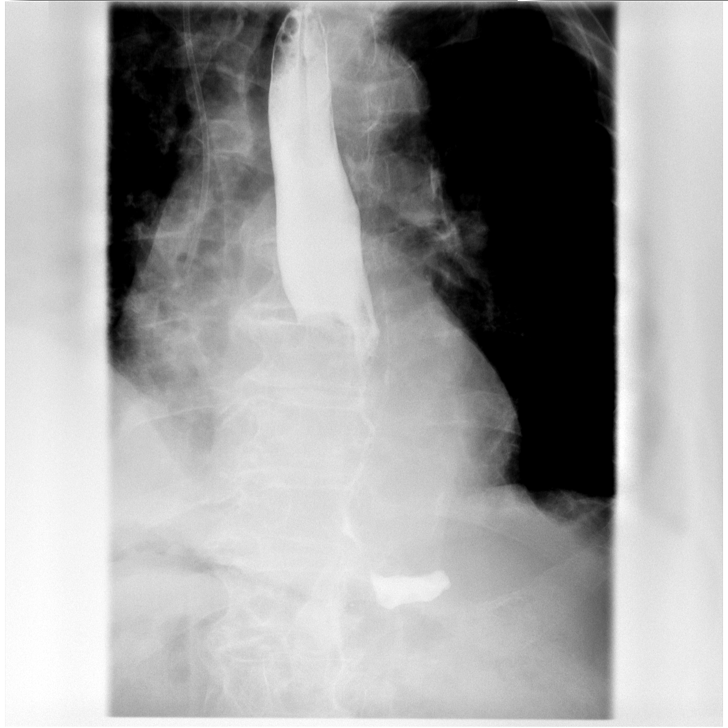

[Series 3: fluoro_barium 2fps_bw · 0.17mm/px · 1 of 1 slices shown (3 of 7)]
[im 1/1]
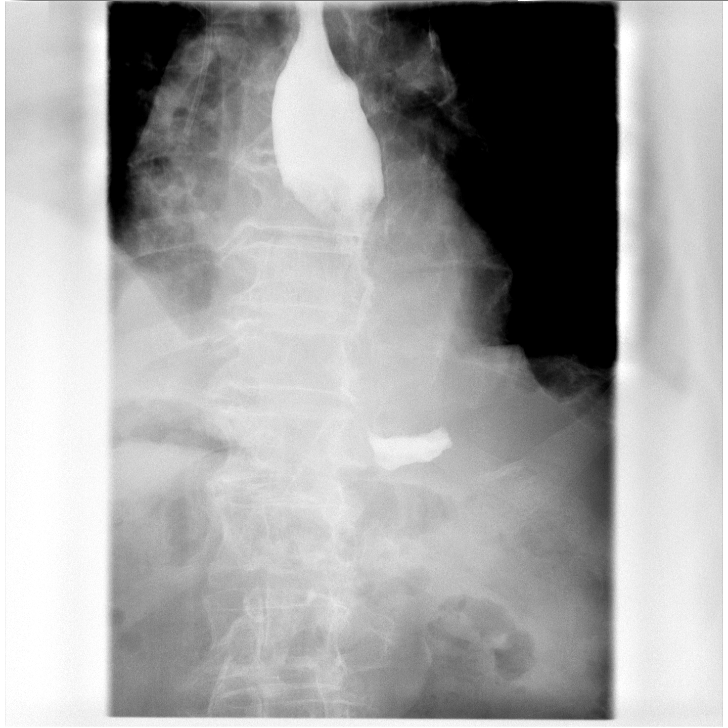

[Series 4: fluoro_barium 2fps_bw · 0.17mm/px · 1 of 1 slices shown (4 of 7)]
[im 1/1]
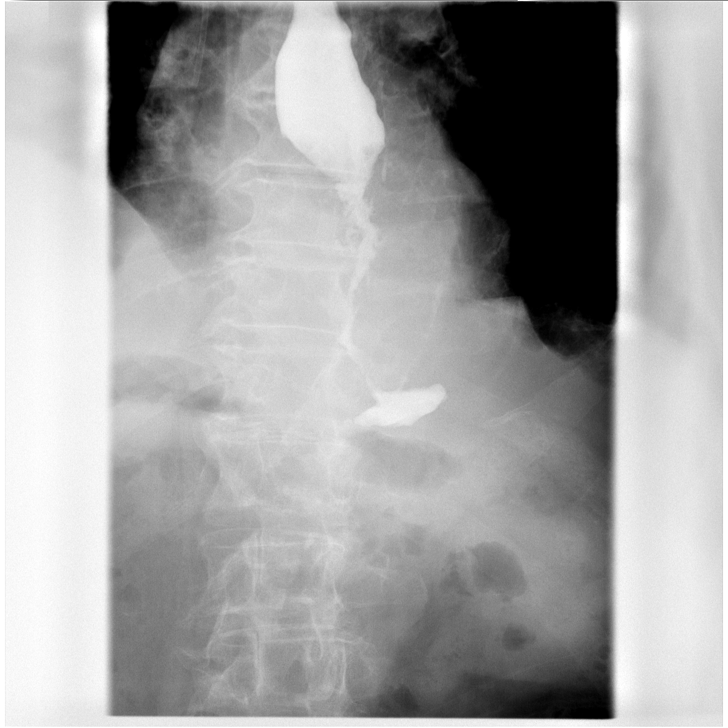

[Series 5: fluoro_barium 2fps_bw · 0.17mm/px · 1 of 1 slices shown (5 of 7)]
[im 1/1]
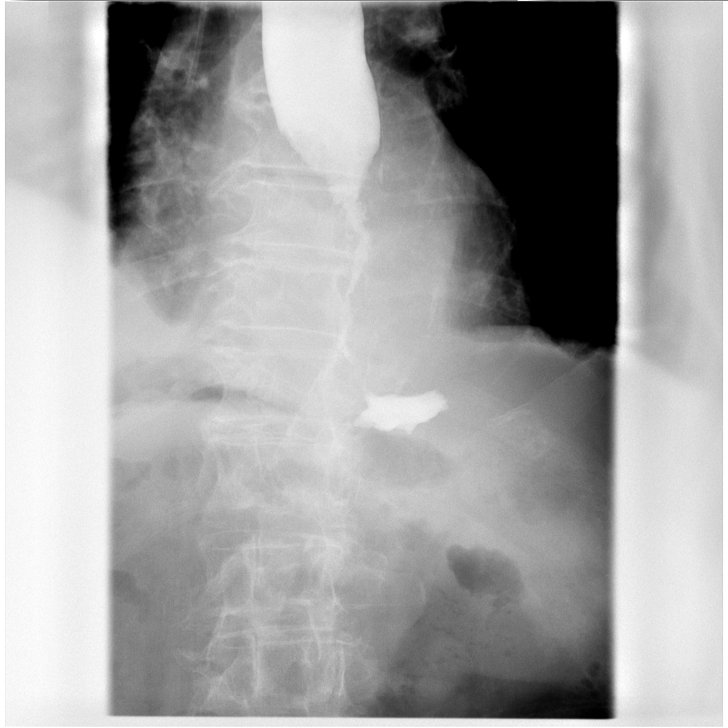

[Series 6: fluoro_barium 2fps_bw · 0.17mm/px · 1 of 1 slices shown (6 of 7)]
[im 1/1]
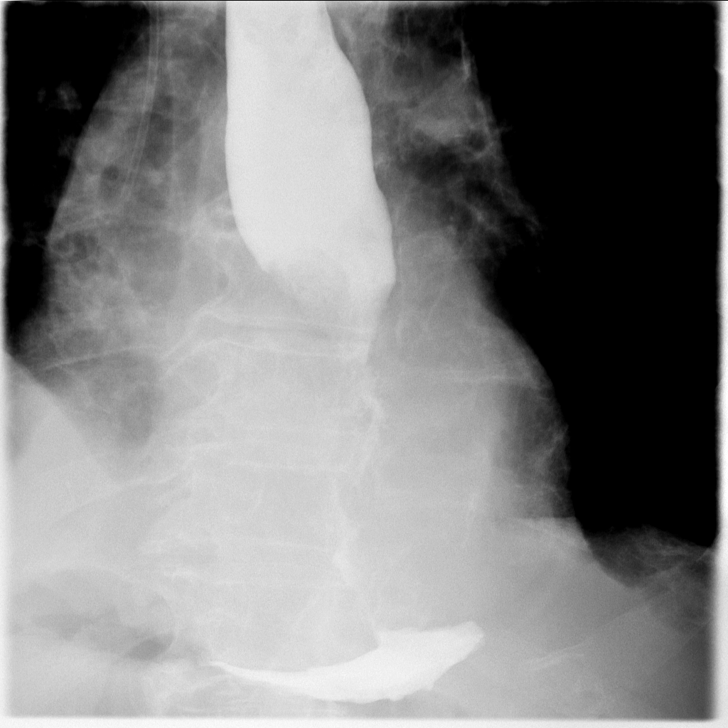

[Series 7: fluoro_barium 2fps_bw · 0.17mm/px · 2 of 2 frames shown (7 of 7)]
[frame 1/2]
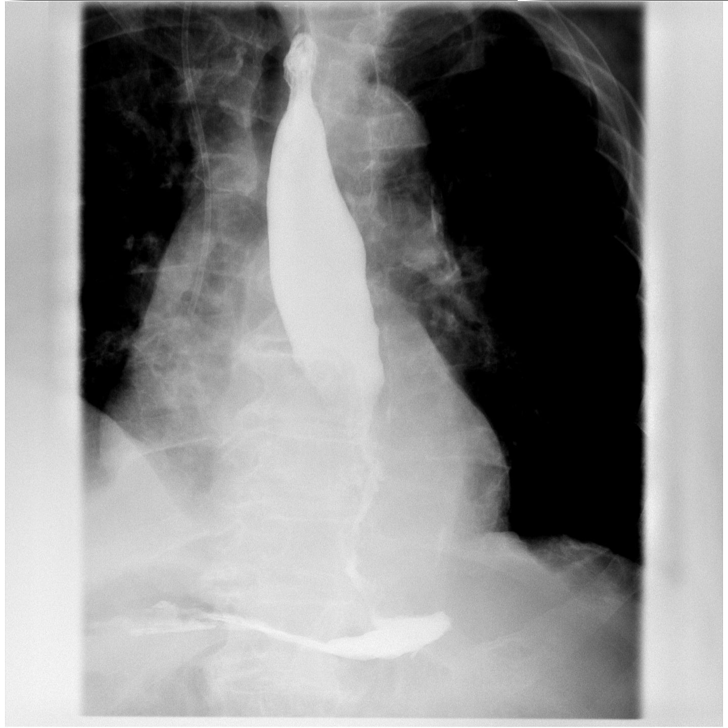
[frame 2/2]
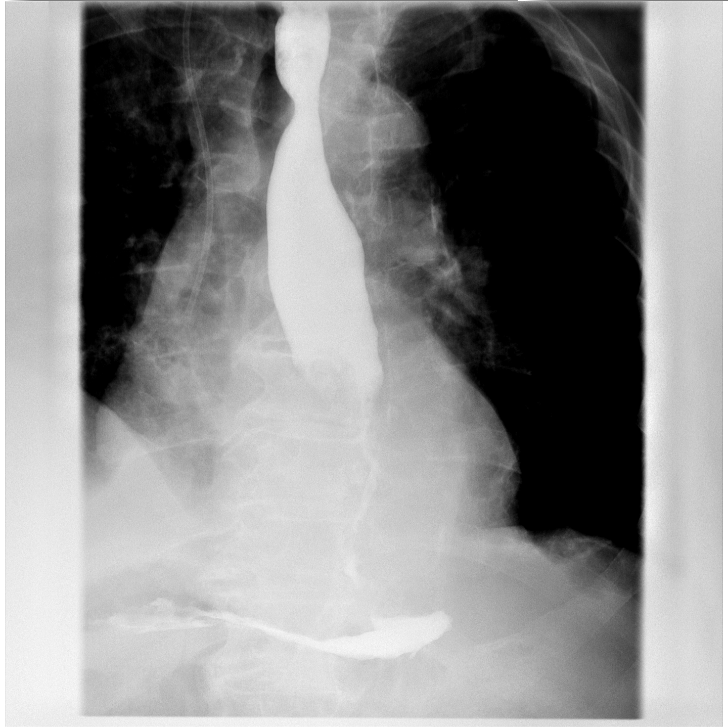

[8 of 8 positions shown; findings below may reference images not displayed]

FINDINGS: Distal esophageal severe irregular narrowing over approximately 8 cm
segment. High-grade partial obstruction. These findings are
worrisome for malignant high-grade partial obstruction of the distal
esophagus.
IMPRESSION: Distal esophageal severe irregular narrowing with near obstruction.
This involves approximately 8 cm of the distal most portion of the
esophagus. Findings are worrisome for esophageal malignancy.

These results will be called to the ordering clinician or
representative by the Radiologist Assistant, and communication
documented in the PACS or zVision Dashboard.
